# Patient Record
Sex: Male | Born: 1955 | Race: White | Hispanic: No | Marital: Single | Smoking: Former smoker
Health system: Southern US, Community
[De-identification: ages and names within clinical notes are randomized; demographics above are authoritative.]

## PROBLEM LIST (undated history)

## (undated) DIAGNOSIS — R159 Full incontinence of feces: Secondary | ICD-10-CM

## (undated) DIAGNOSIS — N189 Chronic kidney disease, unspecified: Secondary | ICD-10-CM

## (undated) DIAGNOSIS — C20 Malignant neoplasm of rectum: Secondary | ICD-10-CM

## (undated) DIAGNOSIS — Z923 Personal history of irradiation: Secondary | ICD-10-CM

## (undated) DIAGNOSIS — C801 Malignant (primary) neoplasm, unspecified: Secondary | ICD-10-CM

---

## 1981-05-16 HISTORY — PX: MANDIBLE SURGERY: SHX707

## 2013-10-09 ENCOUNTER — Inpatient Hospital Stay (HOSPITAL_COMMUNITY)
Admission: EM | Admit: 2013-10-09 | Discharge: 2013-10-20 | DRG: 329 | Disposition: A | Discharge status: Home Health | Payer: Medicaid Other | Attending: Internal Medicine | Admitting: Internal Medicine

## 2013-10-09 ENCOUNTER — Emergency Department (HOSPITAL_COMMUNITY): Payer: Medicaid Other

## 2013-10-09 ENCOUNTER — Encounter (HOSPITAL_COMMUNITY): Payer: Self-pay | Admitting: Emergency Medicine

## 2013-10-09 ENCOUNTER — Inpatient Hospital Stay (HOSPITAL_COMMUNITY): Payer: Medicaid Other

## 2013-10-09 ENCOUNTER — Other Ambulatory Visit (HOSPITAL_COMMUNITY): Payer: Self-pay

## 2013-10-09 DIAGNOSIS — E860 Dehydration: Secondary | ICD-10-CM | POA: Diagnosis present

## 2013-10-09 DIAGNOSIS — C19 Malignant neoplasm of rectosigmoid junction: Secondary | ICD-10-CM | POA: Diagnosis present

## 2013-10-09 DIAGNOSIS — R197 Diarrhea, unspecified: Secondary | ICD-10-CM

## 2013-10-09 DIAGNOSIS — A498 Other bacterial infections of unspecified site: Secondary | ICD-10-CM | POA: Diagnosis present

## 2013-10-09 DIAGNOSIS — N321 Vesicointestinal fistula: Secondary | ICD-10-CM | POA: Diagnosis present

## 2013-10-09 DIAGNOSIS — Z800 Family history of malignant neoplasm of digestive organs: Secondary | ICD-10-CM | POA: Diagnosis not present

## 2013-10-09 DIAGNOSIS — R933 Abnormal findings on diagnostic imaging of other parts of digestive tract: Secondary | ICD-10-CM

## 2013-10-09 DIAGNOSIS — D490 Neoplasm of unspecified behavior of digestive system: Secondary | ICD-10-CM

## 2013-10-09 DIAGNOSIS — D62 Acute posthemorrhagic anemia: Secondary | ICD-10-CM | POA: Diagnosis present

## 2013-10-09 DIAGNOSIS — D500 Iron deficiency anemia secondary to blood loss (chronic): Secondary | ICD-10-CM | POA: Diagnosis not present

## 2013-10-09 DIAGNOSIS — R000 Tachycardia, unspecified: Secondary | ICD-10-CM | POA: Diagnosis present

## 2013-10-09 DIAGNOSIS — N390 Urinary tract infection, site not specified: Secondary | ICD-10-CM | POA: Diagnosis present

## 2013-10-09 DIAGNOSIS — R918 Other nonspecific abnormal finding of lung field: Secondary | ICD-10-CM | POA: Diagnosis present

## 2013-10-09 DIAGNOSIS — Z91018 Allergy to other foods: Secondary | ICD-10-CM

## 2013-10-09 DIAGNOSIS — E43 Unspecified severe protein-calorie malnutrition: Secondary | ICD-10-CM | POA: Diagnosis present

## 2013-10-09 DIAGNOSIS — K922 Gastrointestinal hemorrhage, unspecified: Secondary | ICD-10-CM | POA: Diagnosis present

## 2013-10-09 DIAGNOSIS — N1330 Unspecified hydronephrosis: Secondary | ICD-10-CM | POA: Diagnosis present

## 2013-10-09 DIAGNOSIS — D649 Anemia, unspecified: Secondary | ICD-10-CM

## 2013-10-09 DIAGNOSIS — R634 Abnormal weight loss: Secondary | ICD-10-CM

## 2013-10-09 DIAGNOSIS — Z681 Body mass index (BMI) 19 or less, adult: Secondary | ICD-10-CM

## 2013-10-09 DIAGNOSIS — D638 Anemia in other chronic diseases classified elsewhere: Secondary | ICD-10-CM

## 2013-10-09 DIAGNOSIS — K6289 Other specified diseases of anus and rectum: Secondary | ICD-10-CM

## 2013-10-09 DIAGNOSIS — D509 Iron deficiency anemia, unspecified: Secondary | ICD-10-CM

## 2013-10-09 DIAGNOSIS — C189 Malignant neoplasm of colon, unspecified: Secondary | ICD-10-CM | POA: Insufficient documentation

## 2013-10-09 DIAGNOSIS — N135 Crossing vessel and stricture of ureter without hydronephrosis: Secondary | ICD-10-CM | POA: Diagnosis present

## 2013-10-09 DIAGNOSIS — N368 Other specified disorders of urethra: Secondary | ICD-10-CM | POA: Diagnosis present

## 2013-10-09 HISTORY — DX: Full incontinence of feces: R15.9

## 2013-10-09 LAB — URINALYSIS, ROUTINE W REFLEX MICROSCOPIC
Bilirubin Urine: NEGATIVE
GLUCOSE, UA: NEGATIVE mg/dL
Ketones, ur: NEGATIVE mg/dL
Nitrite: POSITIVE — AB
PROTEIN: 30 mg/dL — AB
SPECIFIC GRAVITY, URINE: 1.02 (ref 1.005–1.030)
Urobilinogen, UA: 0.2 mg/dL (ref 0.0–1.0)
pH: 7 (ref 5.0–8.0)

## 2013-10-09 LAB — CBC WITH DIFFERENTIAL/PLATELET
Basophils Absolute: 0 10*3/uL (ref 0.0–0.1)
Basophils Relative: 0 % (ref 0–1)
EOS PCT: 0 % (ref 0–5)
Eosinophils Absolute: 0 10*3/uL (ref 0.0–0.7)
HEMATOCRIT: 24.7 % — AB (ref 39.0–52.0)
Hemoglobin: 7.3 g/dL — ABNORMAL LOW (ref 13.0–17.0)
Lymphocytes Relative: 3 % — ABNORMAL LOW (ref 12–46)
Lymphs Abs: 0.4 10*3/uL — ABNORMAL LOW (ref 0.7–4.0)
MCH: 18.5 pg — ABNORMAL LOW (ref 26.0–34.0)
MCHC: 29.6 g/dL — ABNORMAL LOW (ref 30.0–36.0)
MCV: 62.7 fL — ABNORMAL LOW (ref 78.0–100.0)
MONOS PCT: 3 % (ref 3–12)
Monocytes Absolute: 0.4 10*3/uL (ref 0.1–1.0)
Neutro Abs: 12 10*3/uL — ABNORMAL HIGH (ref 1.7–7.7)
Neutrophils Relative %: 94 % — ABNORMAL HIGH (ref 43–77)
PLATELETS: 479 10*3/uL — AB (ref 150–400)
RBC: 3.94 MIL/uL — AB (ref 4.22–5.81)
RDW: 17 % — ABNORMAL HIGH (ref 11.5–15.5)
WBC: 12.8 10*3/uL — AB (ref 4.0–10.5)

## 2013-10-09 LAB — COMPREHENSIVE METABOLIC PANEL
ALK PHOS: 97 U/L (ref 39–117)
ALK PHOS: 90 U/L (ref 39–117)
ALT: 8 U/L (ref 0–53)
ALT: 10 U/L (ref 0–53)
AST: 11 U/L (ref 0–37)
AST: 13 U/L (ref 0–37)
Albumin: 2.5 g/dL — ABNORMAL LOW (ref 3.5–5.2)
Albumin: 2.2 g/dL — ABNORMAL LOW (ref 3.5–5.2)
BILIRUBIN TOTAL: 0.4 mg/dL (ref 0.3–1.2)
BUN: 20 mg/dL (ref 6–23)
BUN: 18 mg/dL (ref 6–23)
CHLORIDE: 88 meq/L — AB (ref 96–112)
CO2: 27 meq/L (ref 19–32)
CO2: 24 meq/L (ref 19–32)
CREATININE: 1.28 mg/dL (ref 0.50–1.35)
Calcium: 9.3 mg/dL (ref 8.4–10.5)
Calcium: 8.7 mg/dL (ref 8.4–10.5)
Chloride: 94 mEq/L — ABNORMAL LOW (ref 96–112)
Creatinine, Ser: 1.14 mg/dL (ref 0.50–1.35)
GFR calc Af Amer: 71 mL/min — ABNORMAL LOW (ref >90)
GFR, EST AFRICAN AMERICAN: 81 mL/min — AB (ref >90)
GFR, EST NON AFRICAN AMERICAN: 61 mL/min — AB (ref >90)
GFR, EST NON AFRICAN AMERICAN: 70 mL/min — AB (ref >90)
GLUCOSE: 206 mg/dL — AB (ref 70–99)
Glucose, Bld: 200 mg/dL — ABNORMAL HIGH (ref 70–99)
POTASSIUM: 4.5 meq/L (ref 3.7–5.3)
POTASSIUM: 4.1 meq/L (ref 3.7–5.3)
Sodium: 130 mEq/L — ABNORMAL LOW (ref 137–147)
Sodium: 131 mEq/L — ABNORMAL LOW (ref 137–147)
Total Bilirubin: 0.4 mg/dL (ref 0.3–1.2)
Total Protein: 7.1 g/dL (ref 6.0–8.3)
Total Protein: 6.4 g/dL (ref 6.0–8.3)

## 2013-10-09 LAB — URINE MICROSCOPIC-ADD ON

## 2013-10-09 LAB — MAGNESIUM: Magnesium: 1.9 mg/dL (ref 1.5–2.5)

## 2013-10-09 LAB — PHOSPHORUS: Phosphorus: 3 mg/dL (ref 2.3–4.6)

## 2013-10-09 LAB — PROTIME-INR
INR: 1.19 (ref 0.00–1.49)
INR: 1.34 (ref 0.00–1.49)
Prothrombin Time: 14.8 seconds (ref 11.6–15.2)
Prothrombin Time: 16.3 seconds — ABNORMAL HIGH (ref 11.6–15.2)

## 2013-10-09 LAB — ABO/RH: ABO/RH(D): A POS

## 2013-10-09 LAB — TROPONIN I: Troponin I: <0.3 ng/mL (ref <0.30)

## 2013-10-09 LAB — POC OCCULT BLOOD, ED: FECAL OCCULT BLD: NEGATIVE

## 2013-10-09 LAB — TSH: TSH: 0.571 u[IU]/mL (ref 0.350–4.500)

## 2013-10-09 LAB — LIPASE, BLOOD: Lipase: 10 U/L — ABNORMAL LOW (ref 11–59)

## 2013-10-09 LAB — APTT: aPTT: 34 seconds (ref 24–37)

## 2013-10-09 MED ORDER — SODIUM CHLORIDE 0.9 % IV BOLUS (SEPSIS)
1000.0000 mL | Freq: Once | INTRAVENOUS | Status: AC
  Administered 2013-10-09: 1000 mL via INTRAVENOUS

## 2013-10-09 MED ORDER — IOHEXOL 300 MG/ML  SOLN
100.0000 mL | Freq: Once | INTRAMUSCULAR | Status: AC | PRN
  Administered 2013-10-09: 100 mL via INTRAVENOUS

## 2013-10-09 MED ORDER — HYDRALAZINE HCL 20 MG/ML IJ SOLN
10.0000 mg | INTRAMUSCULAR | Status: DC | PRN
  Administered 2013-10-09: 10 mg via INTRAVENOUS
  Filled 2013-10-09: qty 1

## 2013-10-09 MED ORDER — METRONIDAZOLE IN NACL 5-0.79 MG/ML-% IV SOLN
500.0000 mg | Freq: Three times a day (TID) | INTRAVENOUS | Status: DC
  Filled 2013-10-09: qty 100

## 2013-10-09 MED ORDER — SODIUM CHLORIDE 0.9 % IV SOLN
INTRAVENOUS | Status: AC
  Administered 2013-10-10: via INTRAVENOUS

## 2013-10-09 MED ORDER — ONDANSETRON HCL 4 MG/2ML IJ SOLN
4.0000 mg | Freq: Once | INTRAMUSCULAR | Status: DC
  Filled 2013-10-09: qty 2

## 2013-10-09 MED ORDER — ACETAMINOPHEN 650 MG RE SUPP: 650.0000 mg | Freq: Four times a day (QID) | RECTAL | Status: DC | PRN

## 2013-10-09 MED ORDER — CIPROFLOXACIN IN D5W 400 MG/200ML IV SOLN
400.0000 mg | Freq: Two times a day (BID) | INTRAVENOUS | Status: DC
  Administered 2013-10-09 – 2013-10-15 (×13): 400 mg via INTRAVENOUS
  Filled 2013-10-09 (×14): qty 200

## 2013-10-09 MED ORDER — ONDANSETRON HCL 4 MG PO TABS: 4.0000 mg | ORAL_TABLET | Freq: Four times a day (QID) | ORAL | Status: DC | PRN

## 2013-10-09 MED ORDER — ONDANSETRON HCL 4 MG/2ML IJ SOLN
4.0000 mg | Freq: Four times a day (QID) | INTRAMUSCULAR | Status: DC | PRN
  Administered 2013-10-10 – 2013-10-15 (×5): 4 mg via INTRAVENOUS
  Filled 2013-10-09 (×5): qty 2

## 2013-10-09 MED ORDER — METRONIDAZOLE IN NACL 5-0.79 MG/ML-% IV SOLN
500.0000 mg | Freq: Three times a day (TID) | INTRAVENOUS | Status: DC
  Administered 2013-10-10 – 2013-10-16 (×20): 500 mg via INTRAVENOUS
  Filled 2013-10-09 (×21): qty 100

## 2013-10-09 MED ORDER — HEPARIN SODIUM (PORCINE) 5000 UNIT/ML IJ SOLN
5000.0000 [IU] | Freq: Three times a day (TID) | INTRAMUSCULAR | Status: AC
  Administered 2013-10-09 – 2013-10-10 (×4): 5000 [IU] via SUBCUTANEOUS
  Filled 2013-10-09 (×7): qty 1

## 2013-10-09 MED ORDER — CIPROFLOXACIN IN D5W 400 MG/200ML IV SOLN: 400.0000 mg | Freq: Two times a day (BID) | INTRAVENOUS | Status: DC

## 2013-10-09 MED ORDER — ACETAMINOPHEN 325 MG PO TABS: 650.0000 mg | ORAL_TABLET | Freq: Four times a day (QID) | ORAL | Status: DC | PRN

## 2013-10-09 MED ORDER — MORPHINE SULFATE 2 MG/ML IJ SOLN
1.0000 mg | INTRAMUSCULAR | Status: DC | PRN
  Administered 2013-10-09 – 2013-10-12 (×14): 1 mg via INTRAVENOUS
  Filled 2013-10-09 (×13): qty 1

## 2013-10-09 MED ORDER — SODIUM CHLORIDE 0.9 % IV SOLN
INTRAVENOUS | Status: AC
  Administered 2013-10-11 – 2013-10-13 (×2): via INTRAVENOUS

## 2013-10-09 MED ORDER — SODIUM CHLORIDE 0.9 % IV SOLN: INTRAVENOUS | Status: DC

## 2013-10-09 MED ORDER — IOHEXOL 300 MG/ML  SOLN
25.0000 mL | INTRAMUSCULAR | Status: AC
  Administered 2013-10-09: 25 mL via ORAL

## 2013-10-09 MED ORDER — SODIUM CHLORIDE 0.9 % IJ SOLN
3.0000 mL | Freq: Two times a day (BID) | INTRAMUSCULAR | Status: DC
  Administered 2013-10-14 – 2013-10-16 (×2): 3 mL via INTRAVENOUS

## 2013-10-09 NOTE — ED Notes (Signed)
Pt taken to CT.

## 2013-10-09 NOTE — Consult Note (Signed)
Reason for Consult:abdominal mass Referring Physician: dr Jinny Blossom docherty  Grant [name] is an 58 y.o. male.  HPI: 28 yom with several months of weight loss that has been worse in last month. He had tried to lose weight over last several years losing 200 lbs.  The last month he has been losing weight (25 lbs) and not trying.  He has been trying to supplement with boost without help.  His appetite is decreased.  He has been having intermittent loose stools and when he has a formed stool it is much thinner/smaller. He denies any abdominal pain associated with this.  He is having foul smelling urine that appears more cloudy and today noted some air via his penis.  He has no n/v.  He is passing some flatus but decreased.  He came to er just for persistence of these symptoms and not acute worsening.  Past Medical History  Diagnosis Date  . Fecal incontinence     History reviewed. No pertinent past surgical history.  History reviewed. No pertinent family history.  Social History:  has no tobacco, alcohol, and drug history on file. he dips, nonsmoker now, occ etoh  Allergies:  Allergies  Allergen Reactions  . Strawberry Anaphylaxis and Hives  . Sunflower Oil Anaphylaxis and Hives    seeds  . Watermelon [Citrullus Vulgaris] Anaphylaxis and Hives    Medications: alleve prn  Results for orders placed during the hospital encounter of 10/09/13 (from the past 48 hour(s))  CBC WITH DIFFERENTIAL     Status: Abnormal   Collection Time    10/09/13  1:31 PM      Result Value Ref Range   WBC 12.8 (*) 4.0 - 10.5 K/uL   RBC 3.94 (*) 4.22 - 5.81 MIL/uL   Hemoglobin 7.3 (*) 13.0 - 17.0 g/dL   Comment: REPEATED TO VERIFY   HCT 24.7 (*) 39.0 - 52.0 %   MCV 62.7 (*) 78.0 - 100.0 fL   MCH 18.5 (*) 26.0 - 34.0 pg   MCHC 29.6 (*) 30.0 - 36.0 g/dL   RDW 17.0 (*) 11.5 - 15.5 %   Platelets 479 (*) 150 - 400 K/uL   Comment: REPEATED TO VERIFY   Neutrophils Relative % 94 (*) 43 - 77 %   Lymphocytes  Relative 3 (*) 12 - 46 %   Monocytes Relative 3  3 - 12 %   Eosinophils Relative 0  0 - 5 %   Basophils Relative 0  0 - 1 %   Neutro Abs 12.0 (*) 1.7 - 7.7 K/uL   Lymphs Abs 0.4 (*) 0.7 - 4.0 K/uL   Monocytes Absolute 0.4  0.1 - 1.0 K/uL   Eosinophils Absolute 0.0  0.0 - 0.7 K/uL   Basophils Absolute 0.0  0.0 - 0.1 K/uL   RBC Morphology ELLIPTOCYTES    COMPREHENSIVE METABOLIC PANEL     Status: Abnormal   Collection Time    10/09/13  1:31 PM      Result Value Ref Range   Sodium 130 (*) 137 - 147 mEq/L   Potassium 4.5  3.7 - 5.3 mEq/L   Chloride 88 (*) 96 - 112 mEq/L   CO2 27  19 - 32 mEq/L   Glucose, Bld 200 (*) 70 - 99 mg/dL   BUN 20  6 - 23 mg/dL   Creatinine, Ser 1.28  0.50 - 1.35 mg/dL   Calcium 9.3  8.4 - 10.5 mg/dL   Total Protein 7.1  6.0 - 8.3 g/dL  Albumin 2.5 (*) 3.5 - 5.2 g/dL   AST 11  0 - 37 U/L   ALT 8  0 - 53 U/L   Alkaline Phosphatase 97  39 - 117 U/L   Total Bilirubin 0.4  0.3 - 1.2 mg/dL   GFR calc non Af Amer 61 (*) >90 mL/min   GFR calc Af Amer 71 (*) >90 mL/min   Comment: (NOTE)     The eGFR has been calculated using the CKD EPI equation.     This calculation has not been validated in all clinical situations.     eGFR's persistently <90 mL/min signify possible Chronic Kidney     Disease.  TROPONIN I     Status: None   Collection Time    10/09/13  1:31 PM      Result Value Ref Range   Troponin I <0.30  <0.30 ng/mL   Comment:            Due to the release kinetics of cTnI,     a negative result within the first hours     of the onset of symptoms does not rule out     myocardial infarction with certainty.     If myocardial infarction is still suspected,     repeat the test at appropriate intervals.  PROTIME-INR     Status: None   Collection Time    10/09/13  1:31 PM      Result Value Ref Range   Prothrombin Time 14.8  11.6 - 15.2 seconds   INR 1.19  0.00 - 1.49  LIPASE, BLOOD     Status: Abnormal   Collection Time    10/09/13  1:31 PM       Result Value Ref Range   Lipase 10 (*) 11 - 59 U/L  TYPE AND SCREEN     Status: None   Collection Time    10/09/13  1:58 PM      Result Value Ref Range   ABO/RH(D) A POS     Antibody Screen NEG     Sample Expiration 10/12/2013    ABO/RH     Status: None   Collection Time    10/09/13  1:58 PM      Result Value Ref Range   ABO/RH(D) A POS    POC OCCULT BLOOD, ED     Status: None   Collection Time    10/09/13  2:03 PM      Result Value Ref Range   Fecal Occult Bld NEGATIVE  NEGATIVE  URINALYSIS, ROUTINE W REFLEX MICROSCOPIC     Status: Abnormal   Collection Time    10/09/13  5:22 PM      Result Value Ref Range   Color, Urine YELLOW  YELLOW   APPearance TURBID (*) CLEAR   Specific Gravity, Urine 1.020  1.005 - 1.030   pH 7.0  5.0 - 8.0   Glucose, UA NEGATIVE  NEGATIVE mg/dL   Hgb urine dipstick LARGE (*) NEGATIVE   Bilirubin Urine NEGATIVE  NEGATIVE   Ketones, ur NEGATIVE  NEGATIVE mg/dL   Protein, ur 30 (*) NEGATIVE mg/dL   Urobilinogen, UA 0.2  0.0 - 1.0 mg/dL   Nitrite POSITIVE (*) NEGATIVE   Leukocytes, UA LARGE (*) NEGATIVE  URINE MICROSCOPIC-ADD ON     Status: Abnormal   Collection Time    10/09/13  5:22 PM      Result Value Ref Range   WBC, UA TOO NUMEROUS TO COUNT  <3  WBC/hpf   RBC / HPF 21-50  <3 RBC/hpf   Bacteria, UA MANY (*) RARE    Ct Abdomen Pelvis W Contrast  10/09/2013   CLINICAL DATA:  Weight loss, diarrhea.  EXAM: CT ABDOMEN AND PELVIS WITH CONTRAST  TECHNIQUE: Multidetector CT imaging of the abdomen and pelvis was performed using the standard protocol following bolus administration of intravenous contrast.  CONTRAST:  158m OMNIPAQUE IOHEXOL 300 MG/ML  SOLN  COMPARISON:  None.  FINDINGS: 5 mm nodule noted in the right lower lobe posteriorly, nonspecific. Triangular nodular density at the left base along the left hemidiaphragm on image 9 measures 7 mm. No effusions. Heart is normal size.  There is a large heterogeneous complex mass in the pelvis in the  region of the rectosigmoid colon. This measures approximately 10.2 x 9.1 cm. Extraluminal locules of gas are noted posterior to the bladder concerning for a contained rupture. There is gas layering within the urinary bladder. This may be root related to recent catheterization. If the patient has not been catheterized, this would be concerning for colovesical fistula. There is right hydronephrosis and perinephric stranding with delayed excretion of contrast from the right kidney. The right ureter is dilated into the pelvis where it appears to be encased by the large rectosigmoid mass.  No evidence of a hepatic metastases. Tiny hypodensity in the dome of the liver is noted and nonspecific but most likely represents a small cyst. Spleen, pancreas, adrenals and left kidney are unremarkable. Small bowel is decompressed. Moderate stool throughout the colon, but no definitive evidence for colonic obstruction at this time.  No acute bony abnormality or focal bone lesion.  IMPRESSION: Large mass in the rectosigmoid colon concerning for locally invasive colorectal cancer, likely with contained perforation. This appears to encase/ involve the mid to distal right ureter with right hydronephrosis/obstruction.  Gas within the urinary bladder, question catheterization. If the patient has not been catheterized, this would be concerning for colovesical fistula.  Small bilateral lower lobe pulmonary nodules, nonspecific. Recommend attention on followup imaging.  These results were called by telephone at the time of interpretation on 10/09/2013 at 5:07 PM to Dr. DTawnya Crook who verbally acknowledged these results.   Electronically Signed   By: KRolm BaptiseM.D.   On: 10/09/2013 17:08   Dg Chest Portable 1 View  10/09/2013   CLINICAL DATA:  Weight loss ever since having the flu earlier this year.  EXAM: PORTABLE CHEST - 1 VIEW  COMPARISON:  None.  FINDINGS: Heart, mediastinum and hila are unremarkable. Lungs are clear. No pleural  effusion. No pneumothorax.  Bony thorax is intact.  IMPRESSION: No active disease.   Electronically Signed   By: DLajean ManesM.D.   On: 10/09/2013 14:00    Review of Systems  Constitutional: Positive for weight loss and malaise/fatigue. Negative for fever and chills.  Respiratory: Negative for shortness of breath.   Cardiovascular: Negative for chest pain.  Gastrointestinal: Positive for diarrhea. Negative for nausea, vomiting and abdominal pain.  Genitourinary: Positive for dysuria and frequency.  Neurological: Positive for weakness.   Blood pressure 141/74, pulse 100, temperature 98.2 F (36.8 C), temperature source Oral, resp. rate 19, SpO2 100.00%. Physical Exam  Vitals reviewed. Constitutional: He is oriented to person, place, and time. He appears well-developed and well-nourished.  HENT:  Head: Normocephalic and atraumatic.  Eyes: No scleral icterus.  Neck: Neck supple.  Cardiovascular: Regular rhythm and intact distal pulses.  Tachycardia present.   Respiratory: Effort normal and breath sounds  normal. He has no wheezes. He has no rales.  GI: Soft. Bowel sounds are normal. He exhibits mass. He exhibits no distension. There is tenderness in the suprapubic area. No hernia. Hernia confirmed negative in the right inguinal area and confirmed negative in the left inguinal area.  Genitourinary:  Just had rectal exam and wishes to defer for now.  Musculoskeletal: Normal range of motion. He exhibits no edema and no tenderness.  Lymphadenopathy:    He has no cervical adenopathy.  Neurological: He is alert and oriented to person, place, and time.    Assessment/Plan: Locally advanced rectal cancer  Appears to have locally advanced rectal cancer with possible bladder and right ureteral involvement.  His nutritional status is poor given history, albumin and weight loss.  He is not obstructed or bleeding currently. Would recommend the following: (he does not need surgical intervention  right now): 1. GI consultation tomorrow to evaluate likely rectal cancer  2. Urology consultation tomorrow to evaluate with cystoscopy, evaluate right ureter 3. CEA with am labs, if cr normal and hydrated tomorrow can get ct chest for staging information to rule out metastatic disease as that would alter plan possibly 4. Will check prealbumin in am also, I don't think he can meet nutritional needs orally right now. I think would be best for him to get picc tomorrow and have tna to build up prior to any planned surgery. 5. Make npo after midnight tonight 6. We will follow with you if/when surgery   Rolm Bookbinder 10/09/2013, 7:00 PM

## 2013-10-09 NOTE — ED Notes (Signed)
Gave orange juice per surgeon stated NPO after midnight per patient.

## 2013-10-09 NOTE — ED Notes (Addendum)
Pt has lost 200 pounds in 4 years and past month 25 without trying; diarr and GI issues. Not seen a doctor for it. Fecal incontinence also reported. Has never had a colonoscopy and never seen GI MD. Pt is pale and diaphoretic. Chest pain and SOB upon exertion. Mild abdominal tenderness.

## 2013-10-09 NOTE — Consult Note (Signed)
Urology Consult   Physician requesting consult: Dr. Serita Grammes  Reason for consult: Right ureteral obstruction and large pelvic mass  History of Present Illness: Grant [name] is a 58 y.o. who presented to the ED today with a history of 25 lbs of weight loss unintentionally over the past month (he has intentionally lost about 150 lbs over the past few years) and poor appetite, pneumaturia, and change in bowel habits.  A CT scan of the abdomen and pelvis revealed a large 10 cm pelvic mass consistent with a probable locally advanced rectal carcinoma along with evidence of right ureteral obstruction related to this mass with obstruction of the distal and mid ureter on the right.  He had delayed contrast excretion from the right kidney. CT imaging of the chest revealed three small indeterminate nodules that are concerning but not definitively metastatic disease.  There is no evidence of hepatic metastases noted.  He does have air noted in the bladder and there is air between the anterior rectum and posterior bladder concerning for a contained perforation of the rectum.  He has had cloudy urine aside from his pneumaturia but denies dysuria or hematuria.  Past Medical History  Diagnosis Date  . Fecal incontinence     History reviewed. No pertinent past surgical history.  Medications:  Home meds:    Medication List    ASK your doctor about these medications       ALEVE PO  Take 2 tablets by mouth 2 (two) times daily as needed (arthritis).        Scheduled Meds: . heparin  5,000 Units Subcutaneous 3 times per day   Continuous Infusions: . sodium chloride    . ciprofloxacin    . metronidazole     PRN Meds:.hydrALAZINE  Allergies:  Allergies  Allergen Reactions  . Strawberry Anaphylaxis and Hives  . Sunflower Oil Anaphylaxis and Hives    seeds  . Watermelon [Citrullus Vulgaris] Anaphylaxis and Hives    History reviewed. No pertinent family history.  Social History:   has no tobacco, alcohol, and drug history on file.  ROS: A complete review of systems was performed.  All systems are negative except for pertinent findings as noted.  Physical Exam:  Vital signs in last 24 hours: Temp:  [98.2 F (36.8 C)-99.1 F (37.3 C)] 98.2 F (36.8 C) (05/27 1726) Pulse Rate:  [89-155] 91 (05/27 2045) Resp:  [14-25] 21 (05/27 2045) BP: (116-145)/(63-101) 130/63 mmHg (05/27 2045) SpO2:  [98 %-100 %] 98 % (05/27 2045) Constitutional:  Alert and oriented, No acute distress Cardiovascular: Regular rate and rhythm, No JVD Respiratory: Normal respiratory effort, Lungs clear bilaterally GI: Abdomen is soft, nontender, nondistended, no abdominal masses Genitourinary: No CVAT. Normal male phallus, testes are descended bilaterally and non-tender and without masses, scrotum is normal in appearance without lesions or masses, perineum is normal on inspection. Rectal: Normal sphincter tone, there is a large rectal mass noted toward the right anterior rectum which feels smooth but appears to be compressing the rectal lumen, prostate is non tender and without nodularity. Prostate size is estimated to be 30 cc Lymphatic: No lymphadenopathy Neurologic: Grossly intact, no focal deficits Psychiatric: Normal mood and affect  Laboratory Data:   Recent Labs  10/09/13 1331  WBC 12.8*  HGB 7.3*  HCT 24.7*  PLT 479*     Recent Labs  10/09/13 1331  NA 130*  K 4.5  CL 88*  GLUCOSE 200*  BUN 20  CALCIUM 9.3  CREATININE 1.28  Results for orders placed during the hospital encounter of 10/09/13 (from the past 24 hour(s))  CBC WITH DIFFERENTIAL     Status: Abnormal   Collection Time    10/09/13  1:31 PM      Result Value Ref Range   WBC 12.8 (*) 4.0 - 10.5 K/uL   RBC 3.94 (*) 4.22 - 5.81 MIL/uL   Hemoglobin 7.3 (*) 13.0 - 17.0 g/dL   HCT 24.7 (*) 39.0 - 52.0 %   MCV 62.7 (*) 78.0 - 100.0 fL   MCH 18.5 (*) 26.0 - 34.0 pg   MCHC 29.6 (*) 30.0 - 36.0 g/dL   RDW 17.0  (*) 11.5 - 15.5 %   Platelets 479 (*) 150 - 400 K/uL   Neutrophils Relative % 94 (*) 43 - 77 %   Lymphocytes Relative 3 (*) 12 - 46 %   Monocytes Relative 3  3 - 12 %   Eosinophils Relative 0  0 - 5 %   Basophils Relative 0  0 - 1 %   Neutro Abs 12.0 (*) 1.7 - 7.7 K/uL   Lymphs Abs 0.4 (*) 0.7 - 4.0 K/uL   Monocytes Absolute 0.4  0.1 - 1.0 K/uL   Eosinophils Absolute 0.0  0.0 - 0.7 K/uL   Basophils Absolute 0.0  0.0 - 0.1 K/uL   RBC Morphology ELLIPTOCYTES    COMPREHENSIVE METABOLIC PANEL     Status: Abnormal   Collection Time    10/09/13  1:31 PM      Result Value Ref Range   Sodium 130 (*) 137 - 147 mEq/L   Potassium 4.5  3.7 - 5.3 mEq/L   Chloride 88 (*) 96 - 112 mEq/L   CO2 27  19 - 32 mEq/L   Glucose, Bld 200 (*) 70 - 99 mg/dL   BUN 20  6 - 23 mg/dL   Creatinine, Ser 1.28  0.50 - 1.35 mg/dL   Calcium 9.3  8.4 - 10.5 mg/dL   Total Protein 7.1  6.0 - 8.3 g/dL   Albumin 2.5 (*) 3.5 - 5.2 g/dL   AST 11  0 - 37 U/L   ALT 8  0 - 53 U/L   Alkaline Phosphatase 97  39 - 117 U/L   Total Bilirubin 0.4  0.3 - 1.2 mg/dL   GFR calc non Af Amer 61 (*) >90 mL/min   GFR calc Af Amer 71 (*) >90 mL/min  TROPONIN I     Status: None   Collection Time    10/09/13  1:31 PM      Result Value Ref Range   Troponin I <0.30  <0.30 ng/mL  PROTIME-INR     Status: None   Collection Time    10/09/13  1:31 PM      Result Value Ref Range   Prothrombin Time 14.8  11.6 - 15.2 seconds   INR 1.19  0.00 - 1.49  LIPASE, BLOOD     Status: Abnormal   Collection Time    10/09/13  1:31 PM      Result Value Ref Range   Lipase 10 (*) 11 - 59 U/L  TYPE AND SCREEN     Status: None   Collection Time    10/09/13  1:58 PM      Result Value Ref Range   ABO/RH(D) A POS     Antibody Screen NEG     Sample Expiration 10/12/2013    ABO/RH     Status: None   Collection  Time    10/09/13  1:58 PM      Result Value Ref Range   ABO/RH(D) A POS    POC OCCULT BLOOD, ED     Status: None   Collection Time     10/09/13  2:03 PM      Result Value Ref Range   Fecal Occult Bld NEGATIVE  NEGATIVE  URINALYSIS, ROUTINE W REFLEX MICROSCOPIC     Status: Abnormal   Collection Time    10/09/13  5:22 PM      Result Value Ref Range   Color, Urine YELLOW  YELLOW   APPearance TURBID (*) CLEAR   Specific Gravity, Urine 1.020  1.005 - 1.030   pH 7.0  5.0 - 8.0   Glucose, UA NEGATIVE  NEGATIVE mg/dL   Hgb urine dipstick LARGE (*) NEGATIVE   Bilirubin Urine NEGATIVE  NEGATIVE   Ketones, ur NEGATIVE  NEGATIVE mg/dL   Protein, ur 30 (*) NEGATIVE mg/dL   Urobilinogen, UA 0.2  0.0 - 1.0 mg/dL   Nitrite POSITIVE (*) NEGATIVE   Leukocytes, UA LARGE (*) NEGATIVE  URINE MICROSCOPIC-ADD ON     Status: Abnormal   Collection Time    10/09/13  5:22 PM      Result Value Ref Range   WBC, UA TOO NUMEROUS TO COUNT  <3 WBC/hpf   RBC / HPF 21-50  <3 RBC/hpf   Bacteria, UA MANY (*) RARE    Urine culture is pending.  Renal Function:  Recent Labs  10/09/13 1331  CREATININE 1.28   CrCl is unknown because there is no height on file for the current visit.  Radiologic Imaging: Ct Chest Wo Contrast  10/09/2013   CLINICAL DATA:  Pulmonary nodules.  Colon cancer.  EXAM: CT CHEST WITHOUT CONTRAST  TECHNIQUE: Multidetector CT imaging of the chest was performed following the standard protocol without IV contrast.  COMPARISON:  CT scan dated 10/09/2013.  FINDINGS: There is an 8 x 4 mm nodule in the right upper lobe anteriorly on image 21 of series 3. There is a 5 mm nodule at the right lung base posteriorly on image 59 of series 3. There is a 6 mm nodule at the left lung base on image 54 of series 3 adjacent to the diaphragm. There is a vague area of multiple subtle nodularity in the left lower lobe laterally on images 49 through 52 which I suspect is inflammatory or postinflammatory. Heart and other mediastinal structures are normal. No hilar or mediastinal adenopathy. No osseous abnormality.  IMPRESSION: There are 3 indeterminate  pulmonary nodules as well as what is probably some minimal postinflammatory nodularity in the left lower lobe. Follow-up CT scan without contrast in 3 months is recommended given this patient's known colon cancer.   Electronically Signed   By: Rozetta Nunnery M.D.   On: 10/09/2013 20:01   Ct Abdomen Pelvis W Contrast  10/09/2013   CLINICAL DATA:  Weight loss, diarrhea.  EXAM: CT ABDOMEN AND PELVIS WITH CONTRAST  TECHNIQUE: Multidetector CT imaging of the abdomen and pelvis was performed using the standard protocol following bolus administration of intravenous contrast.  CONTRAST:  148mL OMNIPAQUE IOHEXOL 300 MG/ML  SOLN  COMPARISON:  None.  FINDINGS: 5 mm nodule noted in the right lower lobe posteriorly, nonspecific. Triangular nodular density at the left base along the left hemidiaphragm on image 9 measures 7 mm. No effusions. Heart is normal size.  There is a large heterogeneous complex mass in the pelvis in the region  of the rectosigmoid colon. This measures approximately 10.2 x 9.1 cm. Extraluminal locules of gas are noted posterior to the bladder concerning for a contained rupture. There is gas layering within the urinary bladder. This may be root related to recent catheterization. If the patient has not been catheterized, this would be concerning for colovesical fistula. There is right hydronephrosis and perinephric stranding with delayed excretion of contrast from the right kidney. The right ureter is dilated into the pelvis where it appears to be encased by the large rectosigmoid mass.  No evidence of a hepatic metastases. Tiny hypodensity in the dome of the liver is noted and nonspecific but most likely represents a small cyst. Spleen, pancreas, adrenals and left kidney are unremarkable. Small bowel is decompressed. Moderate stool throughout the colon, but no definitive evidence for colonic obstruction at this time.  No acute bony abnormality or focal bone lesion.  IMPRESSION: Large mass in the rectosigmoid  colon concerning for locally invasive colorectal cancer, likely with contained perforation. This appears to encase/ involve the mid to distal right ureter with right hydronephrosis/obstruction.  Gas within the urinary bladder, question catheterization. If the patient has not been catheterized, this would be concerning for colovesical fistula.  Small bilateral lower lobe pulmonary nodules, nonspecific. Recommend attention on followup imaging.  These results were called by telephone at the time of interpretation on 10/09/2013 at 5:07 PM to Dr. Tawnya Crook, who verbally acknowledged these results.   Electronically Signed   By: Rolm Baptise M.D.   On: 10/09/2013 17:08   Dg Chest Portable 1 View  10/09/2013   CLINICAL DATA:  Weight loss ever since having the flu earlier this year.  EXAM: PORTABLE CHEST - 1 VIEW  COMPARISON:  None.  FINDINGS: Heart, mediastinum and hila are unremarkable. Lungs are clear. No pleural effusion. No pneumothorax.  Bony thorax is intact.  IMPRESSION: No active disease.   Electronically Signed   By: Lajean Manes M.D.   On: 10/09/2013 14:00    I independently reviewed the above imaging studies.  Impression/Recommendation 1) Large pelvic mass consistent with locally advanced rectal carcinoma: Based on his CT findings, he would be at high risk for needing a cystectomy and urinary diversion with primary surgical resection of his pelvic mass. He would likely significantly increase his chances of bladder preservation by downsizing of his mass if possible (and appropriate) from an oncologic standpoint with neoadjuvant chemotherapy/radiation followed by restaging and definitive surgical resection.   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 Recommend treating UTI with culture specific antibiotics and he will need a CT scan of the pelvis with bladder contrast over the next few days to evaluate for fistula more  definitively. If present, this would likely need to be addressed at the time of definitive surgical therapy of his pelvic mass (which can be delayed if necessary).  3) Right ureteral obstruction: Considering the extent of the pelvic mass, retrograde ureteral stenting would likely be very difficult. I would recommend a right percutaneous nephrostomy tube per interventional radiology acutely to relieve obstruction with subsequent antegrade right ureteral stent placement.  Raynelle Bring 10/09/2013, 8:49 PM    Pryor Curia MD  CC: Dr. Rolm Bookbinder

## 2013-10-09 NOTE — H&P (Addendum)
Hospitalist Admission History and Physical  Patient name: Grant [name] Medical record number: [id number] Date of birth: 10/06/1955 Age: 58 y.o. Gender: male  Primary Care Provider: No PCP Per Patient  Chief Complaint: colon cancer  History of Present Illness:This is a 58 y.o. year old male with no significant prior medical history presenting with intermittent diarrhea, weight loss, colon cancer. Patient states he's been having recurrent bouts of nonbilious nonbloody diarrhea for past 5-6 months. Symptoms usually self resolve. There is no associated abdominal pain, nausea or vomiting. Today point the patient was a 25 pound weight loss within the past one month. Denies any fevers or chills at home. Presented to the ER today because of worsening diarrhea. Has also had some foul-smelling urine. Presents to the ER afebrile. Intermittently tachycardic into the 150s. Otherwise hemodynamically stable. White blood cell count of 0.8. Hemoglobin 7.3. Creatinine 1.28. Glucose 200. Lipase LLN @ 10. Hemoccult negative. Had a CT of the abdomen and pelvis showed a large mass in the rectosigmoid colon concerning for locally invasive colorectal cancer with encasement of them right ureter with right hydronephrosis\extraction as well as possible colovesical fistula and bilateral lower lobe pulmonary nodules. Surgery was consulted with recommendation for medical admission.  Patient Active Problem List   Diagnosis Date Noted  . Colon cancer 10/09/2013   Past Medical History: Past Medical History  Diagnosis Date  . Fecal incontinence     Past Surgical History: History reviewed. No pertinent past surgical history.  Social History: History   Social History  . Marital Status: Single    Spouse Name: N/A    Number of Children: N/A  . Years of Education: N/A   Social History Main Topics  . Smoking status: None  . Smokeless tobacco: None  . Alcohol Use: None  . Drug Use: None  . Sexual Activity: None    Other Topics Concern  . None   Social History Narrative  . None    Family History: History reviewed. No pertinent family history.  Allergies: Allergies  Allergen Reactions  . Strawberry Anaphylaxis and Hives  . Sunflower Oil Anaphylaxis and Hives    seeds  . Watermelon [Citrullus Vulgaris] Anaphylaxis and Hives    Current Facility-Administered Medications  Medication Dose Route Frequency Provider Last Rate Last Dose  . heparin injection 5,000 Units  5,000 Units Subcutaneous 3 times per day Shanda Howells, MD      . hydrALAZINE (APRESOLINE) injection 10 mg  10 mg Intravenous Q4H PRN [name], MD   10 mg at 10/09/13 1853  . sodium chloride 0.9 % bolus 1,000 mL  1,000 mL Intravenous Once Ezequiel Essex, MD 500 mL/hr at 10/09/13 1800 1,000 mL at 10/09/13 1800   Current Outpatient Prescriptions  Medication Sig Dispense Refill  . Naproxen Sodium (ALEVE PO) Take 2 tablets by mouth 2 (two) times daily as needed (arthritis).       Review Of Systems: 12 point ROS negative except as noted above in HPI.  Physical Exam: Filed Vitals:   10/09/13 1900  BP: 133/85  Pulse: 155  Temp:   Resp: 15    General: alert, cooperative and cachectic HEENT: PERRLA, extra ocular movement intact and poor dentition  Heart: S1, S2 normal, no murmur, rub or gallop, regular rate and rhythm Lungs: clear to auscultation, no wheezes or rales and unlabored breathing Abdomen: + bowel sounds, mild LLQ/suprapubic TTP  Extremities: extremities normal, atraumatic, no cyanosis or edema Skin:no rashes, no ecchymoses Neurology: normal without focal findings  Labs  and Imaging: Lab Results  Component Value Date/Time   NA 130* 10/09/2013  1:31 PM   K 4.5 10/09/2013  1:31 PM   CL 88* 10/09/2013  1:31 PM   CO2 27 10/09/2013  1:31 PM   BUN 20 10/09/2013  1:31 PM   CREATININE 1.28 10/09/2013  1:31 PM   GLUCOSE 200* 10/09/2013  1:31 PM   Lab Results  Component Value Date   WBC 12.8* 10/09/2013   HGB 7.3*  10/09/2013   HCT 24.7* 10/09/2013   MCV 62.7* 10/09/2013   PLT 479* 10/09/2013   Urinalysis    Component Value Date/Time   COLORURINE YELLOW 10/09/2013 1722   APPEARANCEUR TURBID* 10/09/2013 1722   LABSPEC 1.020 10/09/2013 1722   PHURINE 7.0 10/09/2013 1722   GLUCOSEU NEGATIVE 10/09/2013 1722   HGBUR LARGE* 10/09/2013 1722   BILIRUBINUR NEGATIVE 10/09/2013 1722   KETONESUR NEGATIVE 10/09/2013 1722   PROTEINUR 30* 10/09/2013 1722   UROBILINOGEN 0.2 10/09/2013 1722   NITRITE POSITIVE* 10/09/2013 1722   LEUKOCYTESUR LARGE* 10/09/2013 1722       Ct Abdomen Pelvis W Contrast  10/09/2013   CLINICAL DATA:  Weight loss, diarrhea.  EXAM: CT ABDOMEN AND PELVIS WITH CONTRAST  TECHNIQUE: Multidetector CT imaging of the abdomen and pelvis was performed using the standard protocol following bolus administration of intravenous contrast.  CONTRAST:  117mL OMNIPAQUE IOHEXOL 300 MG/ML  SOLN  COMPARISON:  None.  FINDINGS: 5 mm nodule noted in the right lower lobe posteriorly, nonspecific. Triangular nodular density at the left base along the left hemidiaphragm on image 9 measures 7 mm. No effusions. Heart is normal size.  There is a large heterogeneous complex mass in the pelvis in the region of the rectosigmoid colon. This measures approximately 10.2 x 9.1 cm. Extraluminal locules of gas are noted posterior to the bladder concerning for a contained rupture. There is gas layering within the urinary bladder. This may be root related to recent catheterization. If the patient has not been catheterized, this would be concerning for colovesical fistula. There is right hydronephrosis and perinephric stranding with delayed excretion of contrast from the right kidney. The right ureter is dilated into the pelvis where it appears to be encased by the large rectosigmoid mass.  No evidence of a hepatic metastases. Tiny hypodensity in the dome of the liver is noted and nonspecific but most likely represents a small cyst. Spleen, pancreas,  adrenals and left kidney are unremarkable. Small bowel is decompressed. Moderate stool throughout the colon, but no definitive evidence for colonic obstruction at this time.  No acute bony abnormality or focal bone lesion.  IMPRESSION: Large mass in the rectosigmoid colon concerning for locally invasive colorectal cancer, likely with contained perforation. This appears to encase/ involve the mid to distal right ureter with right hydronephrosis/obstruction.  Gas within the urinary bladder, question catheterization. If the patient has not been catheterized, this would be concerning for colovesical fistula.  Small bilateral lower lobe pulmonary nodules, nonspecific. Recommend attention on followup imaging.  These results were called by telephone at the time of interpretation on 10/09/2013 at 5:07 PM to Dr. Tawnya Crook, who verbally acknowledged these results.   Electronically Signed   By: Rolm Baptise M.D.   On: 10/09/2013 17:08   Dg Chest Portable 1 View  10/09/2013   CLINICAL DATA:  Weight loss ever since having the flu earlier this year.  EXAM: PORTABLE CHEST - 1 VIEW  COMPARISON:  None.  FINDINGS: Heart, mediastinum and hila are unremarkable. Lungs  are clear. No pleural effusion. No pneumothorax.  Bony thorax is intact.  IMPRESSION: No active disease.   Electronically Signed   By: Lajean Manes M.D.   On: 10/09/2013 14:00     Assessment and Plan: Grant [name] is a 58 y.o. year old male presenting with invasive colon cancer   Colon Cancer: Discussed case with Dr. Donne Hazel. Like to hold off on any invasive procedures. Would like to get gastroenterology as well as urology involved.  He will discuss case with them.Discuss antibiotics. He would like to hold off on this. Continued to clinically follow. Low threshold to start broad-spectrum antibiotics if patient spikes fever. Tumor markers.   Anemia: Noted hemoglobin 7.3. Likely secondary to progressive colon cancer. Hemoccult negative today. Type and  screen. Anemia panel.   Leukocytosis: Mild. Noted UA findings indicative of infection in setting of likely colovesical fistula. Urology consult pending. Panculture. Follow up pending urology recs.   Tachycardia: sinus tach on EKG. Asymptomatic. Dry on exam. Hydrate. Telemetry bed.   FEN/GI: NPO PMN. Possible PICC for TNA tomorrow per surgery recs.  Prophylaxis: sub q heparin  Disposition: pending further evaluation  Code Status:Full Code     Clinical Update: Received follow up call from Dr. Donne Hazel. Urology may seen pt tonight. GI to likely see in am. Will place on cipro and flagyl for infectious coverage given UTI findings (noted to be also urinating pockets of air).     Shanda Howells MD  Pager: [phone]

## 2013-10-09 NOTE — ED Provider Notes (Signed)
5:23 PM Care transferred from Dr. Wyvonnia Dusky on ill-appearing 58 y.o. male with wt loss and d/a, near syncopal episode with suspected undiagnosed malignancy. CT w/ large rectosigmoid cancer with contained perforation, R hydronephrosis/obstruction and possible colovesical fistula.  [name]  Gen surgery consulted, will see pt but does not expect pt is good candidate for urgent surgery. Pt will be admitted to [facility].   1. Colon cancer   2. Colovesical fistula   3. Hydronephrosis, right   4. Anemia   5. Diarrhea   6. Unintended weight loss   7. Anemia of chronic disease   8. Severe protein-calorie malnutrition    .   Neta Ehlers, MD 10/10/13 1246

## 2013-10-09 NOTE — ED Provider Notes (Signed)
CSN: [id number]     Arrival date & time 10/09/13  1303 History   First MD Initiated Contact with Patient 10/09/13 1328     Chief Complaint  Patient presents with  . Weight Loss  . Diarrhea     (Consider location/radiation/quality/duration/timing/severity/associated sxs/prior Treatment) HPI Comments: Patient presents with a one-month history of diarrhea that he describes as described loose watery stools without blood. He denies any nausea or vomiting. He states he's lost 25 pounds in the past month without trying. There is generalized weakness, lightheadedness and dizziness.. Near-syncopal episode in the shower today that prompted his evaluation. He has not seen a doctor in 25 years. He denies any chronic medical problems is not take any medications. 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 He appears very pale and weak. States had a poor appetite. He has some chest tightness and lightheadedness with his near-syncopal episode earlier today but none currently.  The history is provided by the patient and the EMS personnel.    Past Medical History  Diagnosis Date  . Fecal incontinence    History reviewed. No pertinent past surgical history. History reviewed. No pertinent family history. History  Substance Use Topics  . Smoking status: Not on file  . Smokeless tobacco: Not on file  . Alcohol Use: Not on file    Review of Systems  Constitutional: Positive for activity change, appetite change and fatigue. Negative for fever.  HENT: Negative for congestion and rhinorrhea.   Respiratory: Positive for chest tightness and shortness of breath.   Cardiovascular: Negative for chest pain.  Gastrointestinal: Positive for abdominal pain and diarrhea. Negative for nausea and vomiting.  Genitourinary: Negative for testicular pain.  Musculoskeletal: Positive for arthralgias and myalgias. Negative for neck pain and neck stiffness.  Skin: Positive for pallor. Negative for wound.  Neurological:  Positive for dizziness, weakness and light-headedness.  A complete 10 system review of systems was obtained and all systems are negative except as noted in the HPI and PMH.      Allergies  Strawberry; Sunflower oil; and Watermelon  Home Medications   Prior to Admission medications   Medication Sig Start Date End Date Taking? Authorizing Provider  Naproxen Sodium (ALEVE PO) Take 2 tablets by mouth 2 (two) times daily as needed (arthritis).   Yes Historical Provider, MD   BP 117/78  Pulse 115  Temp(Src) 99.1 F (37.3 C) (Oral)  Resp 24  SpO2 100% Physical Exam  Constitutional: He is oriented to person, place, and time. He appears well-developed. No distress.  cacehctic and pale  HENT:  Head: Normocephalic and atraumatic.  Mouth/Throat: No oropharyngeal exudate.  Pallor cachectic  Eyes: EOM are normal. Pupils are equal, round, and reactive to light.  Neck: Normal range of motion. Neck supple.  Cardiovascular: Normal rate, regular rhythm and normal heart sounds.   No murmur heard. tachycardia  Pulmonary/Chest: Effort normal. No respiratory distress.  Abdominal: Soft. There is no tenderness. There is no rebound and no guarding.  No masses  Genitourinary: Guaiac negative stool.  No masses, brown stool, no gross blood  Musculoskeletal: Normal range of motion. He exhibits no edema and no tenderness.  Neurological: He is alert and oriented to person, place, and time. No cranial nerve deficit. He exhibits normal muscle tone. Coordination normal.    ED Course  Procedures (including critical care time) Labs Review Labs Reviewed  CBC WITH DIFFERENTIAL - Abnormal; Notable for the following:    WBC 12.8 (*)    RBC  3.94 (*)    Hemoglobin 7.3 (*)    HCT 24.7 (*)    MCV 62.7 (*)    MCH 18.5 (*)    MCHC 29.6 (*)    RDW 17.0 (*)    Platelets 479 (*)    Neutrophils Relative % 94 (*)    Lymphocytes Relative 3 (*)    Neutro Abs 12.0 (*)    Lymphs Abs 0.4 (*)    All other  components within normal limits  COMPREHENSIVE METABOLIC PANEL - Abnormal; Notable for the following:    Sodium 130 (*)    Chloride 88 (*)    Glucose, Bld 200 (*)    Albumin 2.5 (*)    GFR calc non Af Amer 61 (*)    GFR calc Af Amer 71 (*)    All other components within normal limits  LIPASE, BLOOD - Abnormal; Notable for the following:    Lipase 10 (*)    All other components within normal limits  TROPONIN I  PROTIME-INR  URINALYSIS, ROUTINE W REFLEX MICROSCOPIC  POC OCCULT BLOOD, ED  TYPE AND SCREEN    Imaging Review Dg Chest Portable 1 View  10/09/2013   CLINICAL DATA:  Weight loss ever since having the flu earlier this year.  EXAM: PORTABLE CHEST - 1 VIEW  COMPARISON:  None.  FINDINGS: Heart, mediastinum and hila are unremarkable. Lungs are clear. No pleural effusion. No pneumothorax.  Bony thorax is intact.  IMPRESSION: No active disease.   Electronically Signed   By: Lajean Manes M.D.   On: 10/09/2013 14:00     EKG Interpretation   Date/Time:  Wednesday Oct 09 2013 13:18:58 EDT Ventricular Rate:  104 PR Interval:  155 QRS Duration: 98 QT Interval:  327 QTC Calculation: 430 R Axis:   76 Text Interpretation:  Sinus tachycardia Right atrial enlargement No  previous ECGs available Confirmed by Oluwatimilehin Balfour  MD, Avarae Zwart [phone]) on  10/09/2013 1:29:29 PM      MDM   Final diagnoses:  None   Patient appears ill with pallor, tachycardia, dehydration.  Abdomen without peritoneal signs.  Concern for carcinoma, possible colon.   FOBT negative but poor sample.  Hb 7.3  Type and screen sent.   Will hold transfusion at this time as does not appear to be actively bleeding. HR 110s, improving with IV fluids.  IVF and symptom control given. Patient denies pain. He is aware cancer is high on differential. CT pending at time of sign out to Dr. Tawnya Crook.  Anticipate admission.  BP 128/73  Pulse 95  Temp(Src) 99.1 F (37.3 C) (Oral)  Resp 16  SpO2 99%     Ezequiel Essex,  MD 10/09/13 1715

## 2013-10-09 NOTE — ED Notes (Signed)
Pt using the restroom and not taken to CT. CT will return to take pt to scan.

## 2013-10-10 ENCOUNTER — Encounter (HOSPITAL_COMMUNITY): Payer: Self-pay

## 2013-10-10 DIAGNOSIS — K6289 Other specified diseases of anus and rectum: Secondary | ICD-10-CM | POA: Diagnosis present

## 2013-10-10 DIAGNOSIS — E43 Unspecified severe protein-calorie malnutrition: Secondary | ICD-10-CM | POA: Diagnosis present

## 2013-10-10 DIAGNOSIS — N135 Crossing vessel and stricture of ureter without hydronephrosis: Secondary | ICD-10-CM | POA: Diagnosis present

## 2013-10-10 DIAGNOSIS — D649 Anemia, unspecified: Secondary | ICD-10-CM

## 2013-10-10 DIAGNOSIS — R634 Abnormal weight loss: Secondary | ICD-10-CM

## 2013-10-10 DIAGNOSIS — D638 Anemia in other chronic diseases classified elsewhere: Secondary | ICD-10-CM

## 2013-10-10 DIAGNOSIS — D509 Iron deficiency anemia, unspecified: Secondary | ICD-10-CM | POA: Diagnosis present

## 2013-10-10 DIAGNOSIS — R933 Abnormal findings on diagnostic imaging of other parts of digestive tract: Secondary | ICD-10-CM

## 2013-10-10 DIAGNOSIS — N321 Vesicointestinal fistula: Secondary | ICD-10-CM | POA: Diagnosis present

## 2013-10-10 LAB — HEMOGLOBIN AND HEMATOCRIT, BLOOD
HEMATOCRIT: 24.2 % — AB (ref 39.0–52.0)
Hemoglobin: 7.6 g/dL — ABNORMAL LOW (ref 13.0–17.0)

## 2013-10-10 LAB — CBC
HCT: 19.7 % — ABNORMAL LOW (ref 39.0–52.0)
Hemoglobin: 6 g/dL — CL (ref 13.0–17.0)
MCH: 19 pg — ABNORMAL LOW (ref 26.0–34.0)
MCHC: 30.5 g/dL (ref 30.0–36.0)
MCV: 62.3 fL — ABNORMAL LOW (ref 78.0–100.0)
PLATELETS: 377 10*3/uL (ref 150–400)
RBC: 3.16 MIL/uL — ABNORMAL LOW (ref 4.22–5.81)
RDW: 17 % — AB (ref 11.5–15.5)
WBC: 8.4 10*3/uL (ref 4.0–10.5)

## 2013-10-10 LAB — PREPARE RBC (CROSSMATCH)

## 2013-10-10 LAB — COMPREHENSIVE METABOLIC PANEL
ALK PHOS: 81 U/L (ref 39–117)
ALT: 8 U/L (ref 0–53)
AST: 13 U/L (ref 0–37)
Albumin: 2.1 g/dL — ABNORMAL LOW (ref 3.5–5.2)
BILIRUBIN TOTAL: 0.3 mg/dL (ref 0.3–1.2)
BUN: 16 mg/dL (ref 6–23)
CHLORIDE: 94 meq/L — AB (ref 96–112)
CO2: 25 mEq/L (ref 19–32)
CREATININE: 1.14 mg/dL (ref 0.50–1.35)
Calcium: 8.6 mg/dL (ref 8.4–10.5)
GFR calc non Af Amer: 70 mL/min — ABNORMAL LOW (ref >90)
GFR, EST AFRICAN AMERICAN: 81 mL/min — AB (ref >90)
Glucose, Bld: 102 mg/dL — ABNORMAL HIGH (ref 70–99)
Potassium: 3.9 mEq/L (ref 3.7–5.3)
Sodium: 132 mEq/L — ABNORMAL LOW (ref 137–147)
Total Protein: 6.2 g/dL (ref 6.0–8.3)

## 2013-10-10 LAB — PREALBUMIN: Prealbumin: 5.5 mg/dL — ABNORMAL LOW (ref 17.0–34.0)

## 2013-10-10 LAB — AFP TUMOR MARKER: AFP-Tumor Marker: 1.7 ng/mL (ref 0.0–8.0)

## 2013-10-10 LAB — CANCER ANTIGEN 19-9: CA 19-9: 10.3 U/mL — ABNORMAL LOW (ref <35.0)

## 2013-10-10 LAB — CA 125: CA 125: 7.2 U/mL (ref 0.0–30.2)

## 2013-10-10 LAB — CEA: CEA: 3.7 ng/mL (ref 0.0–5.0)

## 2013-10-10 MED ORDER — PEG-KCL-NACL-NASULF-NA ASC-C 100 G PO SOLR: 1.0000 | Freq: Once | ORAL | Status: DC

## 2013-10-10 MED ORDER — HEPARIN SODIUM (PORCINE) 5000 UNIT/ML IJ SOLN
5000.0000 [IU] | Freq: Three times a day (TID) | INTRAMUSCULAR | Status: DC
  Administered 2013-10-12 – 2013-10-14 (×6): 5000 [IU] via SUBCUTANEOUS
  Filled 2013-10-10 (×12): qty 1

## 2013-10-10 MED ORDER — PEG-KCL-NACL-NASULF-NA ASC-C 100 G PO SOLR
0.5000 | Freq: Once | ORAL | Status: DC
  Filled 2013-10-10 (×2): qty 1

## 2013-10-10 MED ORDER — PEG-KCL-NACL-NASULF-NA ASC-C 100 G PO SOLR
0.5000 | Freq: Once | ORAL | Status: AC
  Administered 2013-10-11: 100 g via ORAL
  Filled 2013-10-10: qty 1

## 2013-10-10 NOTE — Consult Note (Signed)
Referring Provider: No ref. provider found Primary Care Physician:  No PCP Per Patient Primary Gastroenterologist:  None, unassigned  Reason for Consultation:  Rectal mass  HPI: Grant [name] is a 58 y.o. male with no significant prior medical history presenting with intermittent diarrhea, weight loss, and weakness. Patient states he's been having recurrent bouts of non-bloody diarrhea for past 4 months or so. Symptoms usually self resolve before returning again. There is no associated abdominal pain, nausea or vomiting.  He has lost close to 300 pounds, most of which was unintentional, but recently he lost 25 pounds unintentionally within the past month or so. Denies any fevers or chills at home.  No issues with urination until the past couple of days when he noticed that he has been passing air when urinating.  In the ER he was afebrile. Intermittently tachycardic into the 150s. Otherwise hemodynamically stable. White blood cell count of 0.8. Hemoglobin 7.3. Creatinine 1.28. Glucose 200. Lipase WLN @ 10. Hemoccult negative, but apparently mass was noted on DRE. Had a CT of the abdomen and pelvis showed a large mass in the rectosigmoid colon concerning for locally invasive colorectal cancer with encasement of the right ureter with right hydronephrosis/obstruction as well as possible colovesical fistula and bilateral lower lobe pulmonary nodules.  Surgery and urology have both been consulted as well.  GI consult has been requested for colonoscopy with tissue biopsy.    CEA 3.7.  CA 19-9, CA 125, and AFP also normal.  Hgb was 6.0 grams this AM so he received two units of PRBC's.  He is having right nephrostomy tube placed tomorrow.  Pre-ablumin is low at 5.5 and there has been discussion regarding PICC placement and initiation of TNA.  He is on a regular diet currently.  Cipro and flagyl have been started.  Patient reports that his mother died of colon cancer and he had a maternal aunt who also  had colon cancer.  Patient himself has never undergone colonoscopy.   Past Medical History  Diagnosis Date  . Fecal incontinence     Past Surgical History  Procedure Laterality Date  . Mandible surgery  1983    Prior to Admission medications   Medication Sig Start Date End Date Taking? Authorizing Provider  Naproxen Sodium (ALEVE PO) Take 2 tablets by mouth 2 (two) times daily as needed (arthritis).   Yes Historical Provider, MD    Current Facility-Administered Medications  Medication Dose Route Frequency Provider Last Rate Last Dose  . 0.9 %  sodium chloride infusion   Intravenous Continuous Shanda Howells, MD      . acetaminophen (TYLENOL) tablet 650 mg  650 mg Oral Q6H PRN [name], MD       Or  . acetaminophen (TYLENOL) suppository 650 mg  650 mg Rectal Q6H PRN [name], MD      . ciprofloxacin (CIPRO) IVPB 400 mg  400 mg Intravenous Q12H [name], MD   400 mg at 10/09/13 2357  . heparin injection 5,000 Units  5,000 Units Subcutaneous 3 times per day Shanda Howells, MD   5,000 Units at 10/10/13 0557  . hydrALAZINE (APRESOLINE) injection 10 mg  10 mg Intravenous Q4H PRN [name], MD   10 mg at 10/09/13 1853  . metroNIDAZOLE (FLAGYL) IVPB 500 mg  500 mg Intravenous Q8H [name], MD   500 mg at 10/10/13 0543  . morphine 2 MG/ML injection 1 mg  1 mg Intravenous Q4H PRN Alma M  Charlies Silvers, MD   1 mg at 10/10/13 [phone]  . ondansetron (ZOFRAN) tablet 4 mg  4 mg Oral Q6H PRN [name], MD       Or  . ondansetron [facility]) injection 4 mg  4 mg Intravenous Q6H PRN [name], MD      . sodium chloride 0.9 % injection 3 mL  3 mL Intravenous Q12H [name], MD        Allergies as of 10/09/2013 - Review Complete 10/09/2013  Allergen Reaction Noted  . Strawberry Anaphylaxis and Hives 10/09/2013  . Sunflower oil Anaphylaxis and Hives 10/09/2013  . Watermelon [citrullus vulgaris] Anaphylaxis and Hives 10/09/2013    History reviewed. No pertinent family  history.  History   Social History  . Marital Status: Single    Spouse Name: N/A    Number of Children: N/A  . Years of Education: N/A   Occupational History  . Not on file.   Social History Main Topics  . Smoking status: Former Smoker    Quit date: 05/16/1988  . Smokeless tobacco: Current User    Types: Snuff  . Alcohol Use: No     Comment: OCCASIONAL   . Drug Use: Yes    Special: Marijuana  . Sexual Activity: Not on file   Other Topics Concern  . Not on file   Social History Narrative  . No narrative on file    Review of Systems: Ten point ROS is O/W negative except as mentioned in HPI.  Physical Exam: Vital signs in last 24 hours: Temp:  [97.8 F (36.6 C)-99.2 F (37.3 C)] 98.5 F (36.9 C) (05/28 0945) Pulse Rate:  [79-155] 87 (05/28 0945) Resp:  [14-25] 16 (05/28 0945) BP: (112-148)/(61-101) 123/72 mmHg (05/28 0945) SpO2:  [98 %-100 %] 99 % (05/28 0945) Weight:  [156 lb (70.761 kg)] 156 lb (70.761 kg) (05/27 2104) Last BM Date: 10/09/13 General:   Alert, thin, cachectic appearing, in NAD Head:  Normocephalic and atraumatic.  Temporal muscle wasting. Eyes:  Sclera clear, no icterus.   Conjunctiva pink. Ears:  Normal auditory acuity. Mouth:  No deformity or lesions.   Lungs:  Clear throughout to auscultation.  No wheezes, crackles, or rhonchi.  Heart:  Regular rate and rhythm; no murmurs, clicks, rubs, or gallops. Abdomen:  Soft, thin, non-distended.  BS present.  Non-tender.   Rectal:  Not performed. Msk:  Symmetrical without gross deformities. Pulses:  Normal pulses noted. Extremities:  Without clubbing or edema.  Muscle wasting noted. Neurologic:  Alert and  oriented x4;  grossly normal neurologically. Skin:  Intact without significant lesions or rashes. Psych:  Alert and cooperative. Normal mood and affect.  Intake/Output this shift: Total I/O In: 12.5 [Blood:12.5] Out: -   Lab Results:  Recent Labs  10/09/13 1331 10/10/13 0345  WBC 12.8*  8.4  HGB 7.3* 6.0*  HCT 24.7* 19.7*  PLT 479* 377   BMET  Recent Labs  10/09/13 1331 10/09/13 2138 10/10/13 0345  NA 130* 131* 132*  K 4.5 4.1 3.9  CL 88* 94* 94*  CO2 27 24 25   GLUCOSE 200* 206* 102*  BUN 20 18 16   CREATININE 1.28 1.14 1.14  CALCIUM 9.3 8.7 8.6   LFT  Recent Labs  10/10/13 0345  PROT 6.2  ALBUMIN 2.1*  AST 13  ALT 8  ALKPHOS 81  BILITOT 0.3   PT/INR  Recent Labs  10/09/13 1331 10/09/13 2138  LABPROT 14.8 16.3*  INR 1.19 1.34  Studies/Results: Ct Chest Wo Contrast  10/09/2013   CLINICAL DATA:  Pulmonary nodules.  Colon cancer.  EXAM: CT CHEST WITHOUT CONTRAST  TECHNIQUE: Multidetector CT imaging of the chest was performed following the standard protocol without IV contrast.  COMPARISON:  CT scan dated 10/09/2013.  FINDINGS: There is an 8 x 4 mm nodule in the right upper lobe anteriorly on image 21 of series 3. There is a 5 mm nodule at the right lung base posteriorly on image 59 of series 3. There is a 6 mm nodule at the left lung base on image 54 of series 3 adjacent to the diaphragm. There is a vague area of multiple subtle nodularity in the left lower lobe laterally on images 49 through 52 which I suspect is inflammatory or postinflammatory. Heart and other mediastinal structures are normal. No hilar or mediastinal adenopathy. No osseous abnormality.  IMPRESSION: There are 3 indeterminate pulmonary nodules as well as what is probably some minimal postinflammatory nodularity in the left lower lobe. Follow-up CT scan without contrast in 3 months is recommended given this patient's known colon cancer.   Electronically Signed   By: Rozetta Nunnery M.D.   On: 10/09/2013 20:01   Ct Abdomen Pelvis W Contrast  10/09/2013   CLINICAL DATA:  Weight loss, diarrhea.  EXAM: CT ABDOMEN AND PELVIS WITH CONTRAST  TECHNIQUE: Multidetector CT imaging of the abdomen and pelvis was performed using the standard protocol following bolus administration of intravenous  contrast.  CONTRAST:  182mL OMNIPAQUE IOHEXOL 300 MG/ML  SOLN  COMPARISON:  None.  FINDINGS: 5 mm nodule noted in the right lower lobe posteriorly, nonspecific. Triangular nodular density at the left base along the left hemidiaphragm on image 9 measures 7 mm. No effusions. Heart is normal size.  There is a large heterogeneous complex mass in the pelvis in the region of the rectosigmoid colon. This measures approximately 10.2 x 9.1 cm. Extraluminal locules of gas are noted posterior to the bladder concerning for a contained rupture. There is gas layering within the urinary bladder. This may be root related to recent catheterization. If the patient has not been catheterized, this would be concerning for colovesical fistula. There is right hydronephrosis and perinephric stranding with delayed excretion of contrast from the right kidney. The right ureter is dilated into the pelvis where it appears to be encased by the large rectosigmoid mass.  No evidence of a hepatic metastases. Tiny hypodensity in the dome of the liver is noted and nonspecific but most likely represents a small cyst. Spleen, pancreas, adrenals and left kidney are unremarkable. Small bowel is decompressed. Moderate stool throughout the colon, but no definitive evidence for colonic obstruction at this time.  No acute bony abnormality or focal bone lesion.  IMPRESSION: Large mass in the rectosigmoid colon concerning for locally invasive colorectal cancer, likely with contained perforation. This appears to encase/ involve the mid to distal right ureter with right hydronephrosis/obstruction.  Gas within the urinary bladder, question catheterization. If the patient has not been catheterized, this would be concerning for colovesical fistula.  Small bilateral lower lobe pulmonary nodules, nonspecific. Recommend attention on followup imaging.  These results were called by telephone at the time of interpretation on 10/09/2013 at 5:07 PM to Dr. Tawnya Crook, who  verbally acknowledged these results.   Electronically Signed   By: Rolm Baptise M.D.   On: 10/09/2013 17:08   Dg Chest Portable 1 View  10/09/2013   CLINICAL DATA:  Weight loss ever since having the flu  earlier this year.  EXAM: PORTABLE CHEST - 1 VIEW  COMPARISON:  None.  FINDINGS: Heart, mediastinum and hila are unremarkable. Lungs are clear. No pleural effusion. No pneumothorax.  Bony thorax is intact.  IMPRESSION: No active disease.   Electronically Signed   By: Lajean Manes M.D.   On: 10/09/2013 14:00    IMPRESSION:  -Colon mass in rectosigmoid colon measuring 10.2 x 9.1 cm:  With right ureteral obstruction/hydronephrosis and possible colovesical fistula.   -Anemia:  Now GI bleeding.  Hgb 6.0 grams this AM.  Received 2 units PRBC's.  Likely secondary to progressive colon cancer. -Malnutrition:  Prealbumin is low at 5.5.  PLAN: -IR planning right nephrostomy tube placement on 5/29. -Urology and surgery both following as well. -Plans for colonoscopy per Dr. Henrene Pastor. -Monitor Hgb. -Continue cipro and flagyl. -There has been discussion regarding PICC placement and TNA.   Laban Emperor. Zehr  10/10/2013, 9:54 AM  Pager number [phone]  GI ATTENDING  History, laboratories, x-rays reviewed. Patient personally seen and examined. Agree with H&P as outlined above. Patient presents with profound weight loss what appears to be a large rectosigmoid cancer with local invasion. Has urologic obstruction. Anemic. Agree with plans for transfusion, nephrostomy tubes, and we'll plan colonoscopy tomorrow in hopes of securing tissue diagnosis.The nature of the procedure, as well as the risks, benefits, and alternatives were carefully and thoroughly reviewed with the patient. Ample time for discussion and questions allowed. The patient understood, was satisfied, and agreed to proceed. Discussed with the patient as well as general surgery.  Docia Chuck. Geri Seminole., M.D. [facility] Division of  Gastroenterology

## 2013-10-10 NOTE — Progress Notes (Addendum)
Patient ID: Grant [name], male   DOB: March 27, 1956, 58 y.o.   MRN: [id number] [facility] PROGRESS NOTE  Nilson Tabora CWC:[id number] DOB: 01-17-1956 DOA: 10/09/2013 PCP: No PCP Per Patient  Brief narrative: 58 year old male with no significant past medical history who presented to [facility] ED 10/09/2013 with reports of intermittent diarrhea, significant weight loss of about 20 pounds in last month, unintentional. He did have intentional 150 pound weight loss over past few years. In ED, patient had CT abdomen and pelvis and chest and was found to have large mass in the rectosigmoid colon concerning for locally invasive colorectal cancer likely with contained perforation. There was also gas seen within the urinary bladder concerning for colovesical fistula. Small bilateral lower lobe pulmonary nodules were also seen. Patient was seen by surgery who recommended GI consultation and urology consultation, obtaining tumor markers, pre-albumin, no surgical intervention at this time. Urology also evaluated the patient and current plan is for possible percutaneous nephrostomy tube placement by interventional radiology.  Assessment and plan:  Principal problem: Locally advanced rectal cancer and possible contained perforation  The patient was seen and evaluated by surgery who recommended GI evaluation, obtaining tumor markers, prealbumin level. No surgical intervention at this time. Awaiting GI consult.  CEA is pending, CEA 19-9 is 10.3 (low), CEA 125 is 7.2 (WNL), AFP is 1.7 (WNL), pre-albumin is low at 5.5.  Appreciate oncology consult and recommendations, they will see the pt today  Started on cipro and flagyl on admission  Active Problems: Large pelvic mass with rectal cancer and possible bladder and right ureteral involvement  Per urology based on CT scan findings patient would be at high risk for a cystectomy and urinary diversion with primary surgical resection of the pelvic mass. We will  followup on oncology consult and recommendations Colovesical fistula  Per urology recommendation is for treatment of UTI and culture specific antibiotics. Patient will also need a repeat CT scan of pelvis and bladder with contrast in next couple of days to evaluate this fistula more definitively..  Right ureteral obstruction  Will likely need placement of a right percutaneous nephrostomy tube to relieve obstruction with subsequent antegrade right ureteral stent placement. Interventional radiology consulted Acute blood loss anemia  Likely secondary to advanced colorectal cancer  Patient is getting second unit of blood this morning.  Hemoglobin was 6, we will obtain posttransfusion hemoglobin Severe protein calorie malnutrition  Low albumin at 5.5, low albumin level at 2.1 and significant weight loss of 20 pounds in the past one month  Patient reports being hungry this morning and wants to eat so we advanced the diet. He may require TNA for nutritional support as well.  DVT prophylaxis: Heparin subQ  Code Status: full code  Family Communication: plan of care discussed with the patient Disposition Plan: home when stable   [name], MD  [facility] Pager [phone]  If 7PM-7AM, please contact night-coverage [email or web address] Password [email or web address] 10/10/2013, 11:24 AM   LOS: 1 day   Consultants:  Urology (Dr. Alinda Money)  Oncology  Surgery  Procedures:  None   Antibiotics:  Cipro 10/09/2013 -->  Flagyl 10/09/2013 -->  HPI/Subjective: No acute overnight events.  Objective: Filed Vitals:   10/10/13 0729 10/10/13 0815 10/10/13 0918 10/10/13 0945  BP: 115/66 112/61 112/67 123/72  Pulse: 88 79 80 87  Temp: 98.6 F (37 C) 97.8 F (36.6 C) 98.8 F (37.1 C) 98.5 F (36.9 C)  TempSrc: Oral Oral Oral Oral  Resp: 16 16 16 16   Height:  Weight:      SpO2:  98% 99% 99%    Intake/Output Summary (Last 24 hours) at 10/10/13 1124 Last data filed at 10/10/13 0715   Gross per 24 hour  Intake   12.5 ml  Output      0 ml  Net   12.5 ml    Exam:   General:  Pt is alert, follows commands appropriately, not in acute distress  Cardiovascular: Regular rate and rhythm, S1/S2, no murmurs  Respiratory: Clear to auscultation bilaterally, no wheezing  Abdomen: Soft, non tender, non distended, bowel sounds present  Extremities: No edema, pulses DP and PT palpable bilaterally  Neuro: Grossly nonfocal  Data Reviewed: Basic Metabolic Panel:  Recent Labs Lab 10/09/13 1331 10/09/13 2138 10/10/13 0345  NA 130* 131* 132*  K 4.5 4.1 3.9  CL 88* 94* 94*  CO2 27 24 25   GLUCOSE 200* 206* 102*  BUN 20 18 16   CREATININE 1.28 1.14 1.14  CALCIUM 9.3 8.7 8.6  MG  --  1.9  --   PHOS  --  3.0  --    Liver Function Tests:  Recent Labs Lab 10/09/13 1331 10/09/13 2138 10/10/13 0345  AST 11 13 13   ALT 8 10 8   ALKPHOS 97 90 81  BILITOT 0.4 0.4 0.3  PROT 7.1 6.4 6.2  ALBUMIN 2.5* 2.2* 2.1*    Recent Labs Lab 10/09/13 1331  LIPASE 10*   No results found for this basename: AMMONIA,  in the last 168 hours CBC:  Recent Labs Lab 10/09/13 1331 10/10/13 0345  WBC 12.8* 8.4  NEUTROABS 12.0*  --   HGB 7.3* 6.0*  HCT 24.7* 19.7*  MCV 62.7* 62.3*  PLT 479* 377   Cardiac Enzymes:  Recent Labs Lab 10/09/13 1331  TROPONINI <0.30   BNP: No components found with this basename: POCBNP,  CBG: No results found for this basename: GLUCAP,  in the last 168 hours  No results found for this or any previous visit (from the past 240 hour(s)).   Studies: Ct Chest Wo Contrast 10/09/2013  IMPRESSION: There are 3 indeterminate pulmonary nodules as well as what is probably some minimal postinflammatory nodularity in the left lower lobe. Follow-up CT scan without contrast in 3 months is recommended given this patient's known colon cancer.     Ct Abdomen Pelvis W Contrast 10/09/2013  IMPRESSION: Large mass in the rectosigmoid colon concerning for locally  invasive colorectal cancer, likely with contained perforation. This appears to encase/ involve the mid to distal right ureter with right hydronephrosis/obstruction.  Gas within the urinary bladder, question catheterization. If the patient has not been catheterized, this would be concerning for colovesical fistula.  Small bilateral lower lobe pulmonary nodules, nonspecific. Recommend attention on followup imaging.    Dg Chest Portable 1 View 10/09/2013   IMPRESSION: No active disease.   Electronically Signed   By: Lajean Manes M.D.   On: 10/09/2013 14:00    Scheduled Meds: . ciprofloxacin  400 mg Intravenous Q12H  . metronidazole  500 mg Intravenous Q8H

## 2013-10-10 NOTE — H&P (Signed)
Chief Complaint: "Diarrhea and weight loss." Referring Physician: Dr. Alinda Money HPI: Grant [name] is an 58 y.o. male who presented after complaining of non-bloody diarrhea and cloudy urine with air. He has also noticed a decrease in his appetite and 25 lb weight loss x 1 month. CT was performed which revealed a large mass in the rectosigmoid colon concerning for locally invasive colorectal cancer, likely with contained perforation. This appears to encase/ involve the mid to distal right ureter with right hydronephrosis/obstruction. IR received request for image guided right percutaneous nephrostomy tube. He denies any chest pain or palpitations, but does admit to increased shortness of breath and fatigue with minimal exertion. He denies any active signs of bleeding or excessive bruising. He denies any recent fever or chills. The patient denies any history of sleep apnea or chronic oxygen use. He denies any known complications to sedation.   Past Medical History:  Past Medical History  Diagnosis Date  . Fecal incontinence     Past Surgical History:  Past Surgical History  Procedure Laterality Date  . Mandible surgery  1983    Family History: History reviewed. No pertinent family history.  Social History:  reports that he quit smoking about 25 years ago. His smokeless tobacco use includes Snuff. He reports that he uses illicit drugs (Marijuana). He reports that he does not drink alcohol.  Allergies:  Allergies  Allergen Reactions  . Strawberry Anaphylaxis and Hives  . Sunflower Oil Anaphylaxis and Hives    seeds  . Watermelon [Citrullus Vulgaris] Anaphylaxis and Hives    Medications:   Medication List    ASK your doctor about these medications       ALEVE PO  Take 2 tablets by mouth 2 (two) times daily as needed (arthritis).       Please HPI for pertinent positives, otherwise complete 10 system ROS negative.  Physical Exam: BP 123/72  Pulse 87  Temp(Src) 98.5 F (36.9 C)  (Oral)  Resp 16  Ht _0  (1.981 m)  Wt 156 lb (70.761 kg)  BMI 18.03 kg/m2  SpO2 99% Body mass index is 18.03 kg/(m^2).  General Appearance:  Alert, cooperative, no distress  Head:  Normocephalic, without obvious abnormality, atraumatic  Neck: Supple, symmetrical, trachea midline  Lungs:   Clear to auscultation bilaterally, no w/r/r, respirations unlabored without use of accessory muscles.  Chest Wall:  No tenderness or deformity  Heart:  Regular rate and rhythm, S1, S2 normal, no murmur, rub or gallop.  Abdomen:   Soft, non-tender, non distended, (+) BS  Extremities: Extremities normal, atraumatic, no cyanosis or edema  Pulses: 1+ and symmetric  Neurologic: Normal affect, no gross deficits.   Results for orders placed during the hospital encounter of 10/09/13 (from the past 48 hour(s))  CBC WITH DIFFERENTIAL     Status: Abnormal   Collection Time    10/09/13  1:31 PM      Result Value Ref Range   WBC 12.8 (*) 4.0 - 10.5 K/uL   RBC 3.94 (*) 4.22 - 5.81 MIL/uL   Hemoglobin 7.3 (*) 13.0 - 17.0 g/dL   Comment: REPEATED TO VERIFY   HCT 24.7 (*) 39.0 - 52.0 %   MCV 62.7 (*) 78.0 - 100.0 fL   MCH 18.5 (*) 26.0 - 34.0 pg   MCHC 29.6 (*) 30.0 - 36.0 g/dL   RDW 17.0 (*) 11.5 - 15.5 %   Platelets 479 (*) 150 - 400 K/uL   Comment: REPEATED TO VERIFY   Neutrophils Relative %  94 (*) 43 - 77 %   Lymphocytes Relative 3 (*) 12 - 46 %   Monocytes Relative 3  3 - 12 %   Eosinophils Relative 0  0 - 5 %   Basophils Relative 0  0 - 1 %   Neutro Abs 12.0 (*) 1.7 - 7.7 K/uL   Lymphs Abs 0.4 (*) 0.7 - 4.0 K/uL   Monocytes Absolute 0.4  0.1 - 1.0 K/uL   Eosinophils Absolute 0.0  0.0 - 0.7 K/uL   Basophils Absolute 0.0  0.0 - 0.1 K/uL   RBC Morphology ELLIPTOCYTES    COMPREHENSIVE METABOLIC PANEL     Status: Abnormal   Collection Time    10/09/13  1:31 PM      Result Value Ref Range   Sodium 130 (*) 137 - 147 mEq/L   Potassium 4.5  3.7 - 5.3 mEq/L   Chloride 88 (*) 96 - 112 mEq/L   CO2 27   19 - 32 mEq/L   Glucose, Bld 200 (*) 70 - 99 mg/dL   BUN 20  6 - 23 mg/dL   Creatinine, Ser 1.28  0.50 - 1.35 mg/dL   Calcium 9.3  8.4 - 10.5 mg/dL   Total Protein 7.1  6.0 - 8.3 g/dL   Albumin 2.5 (*) 3.5 - 5.2 g/dL   AST 11  0 - 37 U/L   ALT 8  0 - 53 U/L   Alkaline Phosphatase 97  39 - 117 U/L   Total Bilirubin 0.4  0.3 - 1.2 mg/dL   GFR calc non Af Amer 61 (*) >90 mL/min   GFR calc Af Amer 71 (*) >90 mL/min   Comment: (NOTE)     The eGFR has been calculated using the CKD EPI equation.     This calculation has not been validated in all clinical situations.     eGFR's persistently <90 mL/min signify possible Chronic Kidney     Disease.  TROPONIN I     Status: None   Collection Time    10/09/13  1:31 PM      Result Value Ref Range   Troponin I <0.30  <0.30 ng/mL   Comment:            Due to the release kinetics of cTnI,     a negative result within the first hours     of the onset of symptoms does not rule out     myocardial infarction with certainty.     If myocardial infarction is still suspected,     repeat the test at appropriate intervals.  PROTIME-INR     Status: None   Collection Time    10/09/13  1:31 PM      Result Value Ref Range   Prothrombin Time 14.8  11.6 - 15.2 seconds   INR 1.19  0.00 - 1.49  LIPASE, BLOOD     Status: Abnormal   Collection Time    10/09/13  1:31 PM      Result Value Ref Range   Lipase 10 (*) 11 - 59 U/L  TYPE AND SCREEN     Status: None   Collection Time    10/09/13  1:58 PM      Result Value Ref Range   ABO/RH(D) A POS     Antibody Screen NEG     Sample Expiration 10/12/2013     Unit Number [id number]     Blood Component Type RED CELLS,LR     Unit division  00     Status of Unit ISSUED     Transfusion Status OK TO TRANSFUSE     Crossmatch Result Compatible     Unit Number [id number]     Blood Component Type RBC LR PHER2     Unit division 00     Status of Unit ISSUED     Transfusion Status OK TO TRANSFUSE     Crossmatch  Result Compatible    ABO/RH     Status: None   Collection Time    10/09/13  1:58 PM      Result Value Ref Range   ABO/RH(D) A POS    POC OCCULT BLOOD, ED     Status: None   Collection Time    10/09/13  2:03 PM      Result Value Ref Range   Fecal Occult Bld NEGATIVE  NEGATIVE  URINALYSIS, ROUTINE W REFLEX MICROSCOPIC     Status: Abnormal   Collection Time    10/09/13  5:22 PM      Result Value Ref Range   Color, Urine YELLOW  YELLOW   APPearance TURBID (*) CLEAR   Specific Gravity, Urine 1.020  1.005 - 1.030   pH 7.0  5.0 - 8.0   Glucose, UA NEGATIVE  NEGATIVE mg/dL   Hgb urine dipstick LARGE (*) NEGATIVE   Bilirubin Urine NEGATIVE  NEGATIVE   Ketones, ur NEGATIVE  NEGATIVE mg/dL   Protein, ur 30 (*) NEGATIVE mg/dL   Urobilinogen, UA 0.2  0.0 - 1.0 mg/dL   Nitrite POSITIVE (*) NEGATIVE   Leukocytes, UA LARGE (*) NEGATIVE  URINE MICROSCOPIC-ADD ON     Status: Abnormal   Collection Time    10/09/13  5:22 PM      Result Value Ref Range   WBC, UA TOO NUMEROUS TO COUNT  <3 WBC/hpf   RBC / HPF 21-50  <3 RBC/hpf   Bacteria, UA MANY (*) RARE  CANCER ANTIGEN 19-9     Status: Abnormal   Collection Time    10/09/13  9:38 PM      Result Value Ref Range   CA 19-9 10.3 (*) <35.0 U/mL   Comment: Performed at [facility]  CA 125     Status: None   Collection Time    10/09/13  9:38 PM      Result Value Ref Range   CA 125 7.2  0.0 - 30.2 U/mL   Comment: Performed at [facility]  AFP TUMOR MARKER     Status: None   Collection Time    10/09/13  9:38 PM      Result Value Ref Range   AFP-Tumor Marker 1.7  0.0 - 8.0 ng/mL   Comment: (NOTE)     The Advia Centaur AFP immunoassay method is used.  Results obtained     with different assay methods or kits cannot be used interchangeably.     AFP is a valuable aid in the management of nonseminomatous testicular     cancer patients when used in conjunction with information available     from the clinical evaluation and other  diagnostic procedures.     Increased serum AFP concentrations have also been observed in ataxia     telangiectasia, hereditary tyrosinemia, primary hepatocellular     carcinoma, teratocarcinoma, gastrointestinal tract cancers with and     without liver metastases, and in benign hepatic conditions such as     acute viral hepatitis, chronic active hepatitis, and cirrhosis.  This     result cannot be interpreted as absolute evidence of the presence or     absence of malignant disease.  This result is not interpretable in     pregnant females.     Performed at [place] [facility]     Status: Abnormal   Collection Time    10/09/13  9:38 PM      Result Value Ref Range   Prealbumin 5.5 (*) 17.0 - 34.0 mg/dL   Comment: Performed at [place]     Status: Abnormal   Collection Time    10/09/13  9:38 PM      Result Value Ref Range   Sodium 131 (*) 137 - 147 mEq/L   Potassium 4.1  3.7 - 5.3 mEq/L   Chloride 94 (*) 96 - 112 mEq/L   CO2 24  19 - 32 mEq/L   Glucose, Bld 206 (*) 70 - 99 mg/dL   BUN 18  6 - 23 mg/dL   Creatinine, Ser 1.14  0.50 - 1.35 mg/dL   Calcium 8.7  8.4 - 10.5 mg/dL   Total Protein 6.4  6.0 - 8.3 g/dL   Albumin 2.2 (*) 3.5 - 5.2 g/dL   AST 13  0 - 37 U/L   ALT 10  0 - 53 U/L   Alkaline Phosphatase 90  39 - 117 U/L   Total Bilirubin 0.4  0.3 - 1.2 mg/dL   GFR calc non Af Amer 70 (*) >90 mL/min   GFR calc Af Amer 81 (*) >90 mL/min   Comment: (NOTE)     The eGFR has been calculated using the CKD EPI equation.     This calculation has not been validated in all clinical situations.     eGFR's persistently <90 mL/min signify possible Chronic Kidney     Disease.  MAGNESIUM     Status: None   Collection Time    10/09/13  9:38 PM      Result Value Ref Range   Magnesium 1.9  1.5 - 2.5 mg/dL  PHOSPHORUS     Status: None   Collection Time    10/09/13  9:38 PM      Result Value Ref Range   Phosphorus 3.0  2.3 - 4.6  mg/dL  APTT     Status: None   Collection Time    10/09/13  9:38 PM      Result Value Ref Range   aPTT 34  24 - 37 seconds  PROTIME-INR     Status: Abnormal   Collection Time    10/09/13  9:38 PM      Result Value Ref Range   Prothrombin Time 16.3 (*) 11.6 - 15.2 seconds   INR 1.34  0.00 - 1.49  TSH     Status: None   Collection Time    10/09/13  9:38 PM      Result Value Ref Range   TSH 0.571  0.350 - 4.500 uIU/mL   Comment: Please note change in reference range.  COMPREHENSIVE METABOLIC PANEL     Status: Abnormal   Collection Time    10/10/13  3:45 AM      Result Value Ref Range   Sodium 132 (*) 137 - 147 mEq/L   Potassium 3.9  3.7 - 5.3 mEq/L   Chloride 94 (*) 96 - 112 mEq/L   CO2 25  19 - 32 mEq/L   Glucose, Bld 102 (*) 70 -  99 mg/dL   BUN 16  6 - 23 mg/dL   Creatinine, Ser 1.14  0.50 - 1.35 mg/dL   Calcium 8.6  8.4 - 10.5 mg/dL   Total Protein 6.2  6.0 - 8.3 g/dL   Albumin 2.1 (*) 3.5 - 5.2 g/dL   AST 13  0 - 37 U/L   ALT 8  0 - 53 U/L   Alkaline Phosphatase 81  39 - 117 U/L   Total Bilirubin 0.3  0.3 - 1.2 mg/dL   GFR calc non Af Amer 70 (*) >90 mL/min   GFR calc Af Amer 81 (*) >90 mL/min   Comment: (NOTE)     The eGFR has been calculated using the CKD EPI equation.     This calculation has not been validated in all clinical situations.     eGFR's persistently <90 mL/min signify possible Chronic Kidney     Disease.  CBC     Status: Abnormal   Collection Time    10/10/13  3:45 AM      Result Value Ref Range   WBC 8.4  4.0 - 10.5 K/uL   RBC 3.16 (*) 4.22 - 5.81 MIL/uL   Hemoglobin 6.0 (*) 13.0 - 17.0 g/dL   Comment: REPEATED TO VERIFY     DELTA CHECK NOTED     CRITICAL RESULT CALLED TO, READ BACK BY AND VERIFIED WITH:     N IRISH,RN 10/10/13 0501 RHOLMES   HCT 19.7 (*) 39.0 - 52.0 %   MCV 62.3 (*) 78.0 - 100.0 fL   MCH 19.0 (*) 26.0 - 34.0 pg   MCHC 30.5  30.0 - 36.0 g/dL   RDW 17.0 (*) 11.5 - 15.5 %   Platelets 377  150 - 400 K/uL  PREPARE RBC  (CROSSMATCH)     Status: None   Collection Time    10/10/13  5:07 AM      Result Value Ref Range   Order Confirmation ORDER PROCESSED BY BLOOD BANK     Ct Chest Wo Contrast  10/09/2013   CLINICAL DATA:  Pulmonary nodules.  Colon cancer.  EXAM: CT CHEST WITHOUT CONTRAST  TECHNIQUE: Multidetector CT imaging of the chest was performed following the standard protocol without IV contrast.  COMPARISON:  CT scan dated 10/09/2013.  FINDINGS: There is an 8 x 4 mm nodule in the right upper lobe anteriorly on image 21 of series 3. There is a 5 mm nodule at the right lung base posteriorly on image 59 of series 3. There is a 6 mm nodule at the left lung base on image 54 of series 3 adjacent to the diaphragm. There is a vague area of multiple subtle nodularity in the left lower lobe laterally on images 49 through 52 which I suspect is inflammatory or postinflammatory. Heart and other mediastinal structures are normal. No hilar or mediastinal adenopathy. No osseous abnormality.  IMPRESSION: There are 3 indeterminate pulmonary nodules as well as what is probably some minimal postinflammatory nodularity in the left lower lobe. Follow-up CT scan without contrast in 3 months is recommended given this patient's known colon cancer.   Electronically Signed   By: Rozetta Nunnery M.D.   On: 10/09/2013 20:01   Ct Abdomen Pelvis W Contrast  10/09/2013   CLINICAL DATA:  Weight loss, diarrhea.  EXAM: CT ABDOMEN AND PELVIS WITH CONTRAST  TECHNIQUE: Multidetector CT imaging of the abdomen and pelvis was performed using the standard protocol following bolus administration of intravenous contrast.  CONTRAST:  119m OMNIPAQUE IOHEXOL 300 MG/ML  SOLN  COMPARISON:  None.  FINDINGS: 5 mm nodule noted in the right lower lobe posteriorly, nonspecific. Triangular nodular density at the left base along the left hemidiaphragm on image 9 measures 7 mm. No effusions. Heart is normal size.  There is a large heterogeneous complex mass in the pelvis in  the region of the rectosigmoid colon. This measures approximately 10.2 x 9.1 cm. Extraluminal locules of gas are noted posterior to the bladder concerning for a contained rupture. There is gas layering within the urinary bladder. This may be root related to recent catheterization. If the patient has not been catheterized, this would be concerning for colovesical fistula. There is right hydronephrosis and perinephric stranding with delayed excretion of contrast from the right kidney. The right ureter is dilated into the pelvis where it appears to be encased by the large rectosigmoid mass.  No evidence of a hepatic metastases. Tiny hypodensity in the dome of the liver is noted and nonspecific but most likely represents a small cyst. Spleen, pancreas, adrenals and left kidney are unremarkable. Small bowel is decompressed. Moderate stool throughout the colon, but no definitive evidence for colonic obstruction at this time.  No acute bony abnormality or focal bone lesion.  IMPRESSION: Large mass in the rectosigmoid colon concerning for locally invasive colorectal cancer, likely with contained perforation. This appears to encase/ involve the mid to distal right ureter with right hydronephrosis/obstruction.  Gas within the urinary bladder, question catheterization. If the patient has not been catheterized, this would be concerning for colovesical fistula.  Small bilateral lower lobe pulmonary nodules, nonspecific. Recommend attention on followup imaging.  These results were called by telephone at the time of interpretation on 10/09/2013 at 5:07 PM to Dr. DTawnya Crook who verbally acknowledged these results.   Electronically Signed   By: KRolm BaptiseM.D.   On: 10/09/2013 17:08   Dg Chest Portable 1 View  10/09/2013   CLINICAL DATA:  Weight loss ever since having the flu earlier this year.  EXAM: PORTABLE CHEST - 1 VIEW  COMPARISON:  None.  FINDINGS: Heart, mediastinum and hila are unremarkable. Lungs are clear. No pleural  effusion. No pneumothorax.  Bony thorax is intact.  IMPRESSION: No active disease.   Electronically Signed   By: DLajean ManesM.D.   On: 10/09/2013 14:00    Assessment/Plan Non-bloody diarrhea Weight loss Pneumaturia Large mass in rectosigmoid, CT reviewed. Right hydronephrosis Anemia, hgb 6.0 received 2 Units of PRBC today, check labs in am Request for image guided right nephrostomy tube placement with moderate sedation. Patient will be NPO after midnight, sq heparin held Risks and Benefits discussed with the patient. All of the patient's questions were answered, patient is agreeable to proceed. Consent signed and in chart.    KHedy JacobPA-C 10/10/2013, 12:03 PM

## 2013-10-10 NOTE — Progress Notes (Signed)
Subjective: He just got 2 units of blood and a cheeseburger, so he is relatively happy.  No complaints of pain.  I don't think he has seen Gi  Yet.    Objective: Vital signs in last 24 hours: Temp:  [97.8 F (36.6 C)-99.2 F (37.3 C)] 98.5 F (36.9 C) (05/28 0945) Pulse Rate:  [79-155] 87 (05/28 0945) Resp:  [14-25] 16 (05/28 0945) BP: (112-148)/(61-101) 123/72 mmHg (05/28 0945) SpO2:  [98 %-100 %] 99 % (05/28 0945) Weight:  [70.761 kg (156 lb)] 70.761 kg (156 lb) (05/27 2104) Last BM Date: 10/09/13 Tm 99.2,  VSS  H/H 6.0 19.7  Intake/Output from previous day:   Intake/Output this shift: Total I/O In: 12.5 [Blood:12.5] Out: -   General appearance: alert, cooperative and no distress GI: soft, non-tender; bowel sounds normal; no masses,  no organomegaly  Lab Results:   Recent Labs  10/09/13 1331 10/10/13 0345  WBC 12.8* 8.4  HGB 7.3* 6.0*  HCT 24.7* 19.7*  PLT 479* 377    BMET  Recent Labs  10/09/13 2138 10/10/13 0345  NA 131* 132*  K 4.1 3.9  CL 94* 94*  CO2 24 25  GLUCOSE 206* 102*  BUN 18 16  CREATININE 1.14 1.14  CALCIUM 8.7 8.6   PT/INR  Recent Labs  10/09/13 1331 10/09/13 2138  LABPROT 14.8 16.3*  INR 1.19 1.34     Recent Labs Lab 10/09/13 1331 10/09/13 2138 10/10/13 0345  AST 11 13 13   ALT 8 10 8   ALKPHOS 97 90 81  BILITOT 0.4 0.4 0.3  PROT 7.1 6.4 6.2  ALBUMIN 2.5* 2.2* 2.1*     Lipase     Component Value Date/Time   LIPASE 10* 10/09/2013 1331     Studies/Results: Ct Chest Wo Contrast  10/09/2013   CLINICAL DATA:  Pulmonary nodules.  Colon cancer.  EXAM: CT CHEST WITHOUT CONTRAST  TECHNIQUE: Multidetector CT imaging of the chest was performed following the standard protocol without IV contrast.  COMPARISON:  CT scan dated 10/09/2013.  FINDINGS: There is an 8 x 4 mm nodule in the right upper lobe anteriorly on image 21 of series 3. There is a 5 mm nodule at the right lung base posteriorly on image 59 of series 3. There is  a 6 mm nodule at the left lung base on image 54 of series 3 adjacent to the diaphragm. There is a vague area of multiple subtle nodularity in the left lower lobe laterally on images 49 through 52 which I suspect is inflammatory or postinflammatory. Heart and other mediastinal structures are normal. No hilar or mediastinal adenopathy. No osseous abnormality.  IMPRESSION: There are 3 indeterminate pulmonary nodules as well as what is probably some minimal postinflammatory nodularity in the left lower lobe. Follow-up CT scan without contrast in 3 months is recommended given this patient's known colon cancer.   Electronically Signed   By: Rozetta Nunnery M.D.   On: 10/09/2013 20:01   Ct Abdomen Pelvis W Contrast  10/09/2013   CLINICAL DATA:  Weight loss, diarrhea.  EXAM: CT ABDOMEN AND PELVIS WITH CONTRAST  TECHNIQUE: Multidetector CT imaging of the abdomen and pelvis was performed using the standard protocol following bolus administration of intravenous contrast.  CONTRAST:  155mL OMNIPAQUE IOHEXOL 300 MG/ML  SOLN  COMPARISON:  None.  FINDINGS: 5 mm nodule noted in the right lower lobe posteriorly, nonspecific. Triangular nodular density at the left base along the left hemidiaphragm on image 9 measures 7 mm. No effusions.  Heart is normal size.  There is a large heterogeneous complex mass in the pelvis in the region of the rectosigmoid colon. This measures approximately 10.2 x 9.1 cm. Extraluminal locules of gas are noted posterior to the bladder concerning for a contained rupture. There is gas layering within the urinary bladder. This may be root related to recent catheterization. If the patient has not been catheterized, this would be concerning for colovesical fistula. There is right hydronephrosis and perinephric stranding with delayed excretion of contrast from the right kidney. The right ureter is dilated into the pelvis where it appears to be encased by the large rectosigmoid mass.  No evidence of a hepatic  metastases. Tiny hypodensity in the dome of the liver is noted and nonspecific but most likely represents a small cyst. Spleen, pancreas, adrenals and left kidney are unremarkable. Small bowel is decompressed. Moderate stool throughout the colon, but no definitive evidence for colonic obstruction at this time.  No acute bony abnormality or focal bone lesion.  IMPRESSION: Large mass in the rectosigmoid colon concerning for locally invasive colorectal cancer, likely with contained perforation. This appears to encase/ involve the mid to distal right ureter with right hydronephrosis/obstruction.  Gas within the urinary bladder, question catheterization. If the patient has not been catheterized, this would be concerning for colovesical fistula.  Small bilateral lower lobe pulmonary nodules, nonspecific. Recommend attention on followup imaging.  These results were called by telephone at the time of interpretation on 10/09/2013 at 5:07 PM to Dr. Tawnya Crook, who verbally acknowledged these results.   Electronically Signed   By: Rolm Baptise M.D.   On: 10/09/2013 17:08   Dg Chest Portable 1 View  10/09/2013   CLINICAL DATA:  Weight loss ever since having the flu earlier this year.  EXAM: PORTABLE CHEST - 1 VIEW  COMPARISON:  None.  FINDINGS: Heart, mediastinum and hila are unremarkable. Lungs are clear. No pleural effusion. No pneumothorax.  Bony thorax is intact.  IMPRESSION: No active disease.   Electronically Signed   By: Lajean Manes M.D.   On: 10/09/2013 14:00    Medications: . ciprofloxacin  400 mg Intravenous Q12H  . heparin  5,000 Units Subcutaneous 3 times per day  . metronidazole  500 mg Intravenous Q8H  . sodium chloride  3 mL Intravenous Q12H   . sodium chloride     Prior to Admission medications   Medication Sig Start Date End Date Taking? Authorizing Provider  Naproxen Sodium (ALEVE PO) Take 2 tablets by mouth 2 (two) times daily as needed (arthritis).   Yes Historical Provider, MD      Assessment/Plan 1.  10.2 x 9.1 cm Mass, Locally advanced rectal cancer 2.  Right ureteral obstruction 3.  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  Pulmonary nodules   Plan:  We will follow and await GI and then discuss approach to treating this.  He may need to see Oncology also, but not till we have a tissue bx to discuss.   LOS: 1 day    Earnstine Regal 10/10/2013
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
## 2013-10-10 NOTE — Progress Notes (Signed)
Patient has been seen by GI and oncology.  Also, Dr. Karie Georges from Urology has seen him.  Plan for percutaneous nephrostomy tube tomorrow and possible colonoscopy.  Probably will not get surgery until next week.  Grant [name]. Dahlia Bailiff, MD, [place] [phone] [facility] Surgery

## 2013-10-11 ENCOUNTER — Inpatient Hospital Stay (HOSPITAL_COMMUNITY): Payer: Medicaid Other | Admitting: Certified Registered"

## 2013-10-11 ENCOUNTER — Inpatient Hospital Stay (HOSPITAL_COMMUNITY): Payer: Medicaid Other

## 2013-10-11 ENCOUNTER — Encounter (HOSPITAL_COMMUNITY)
Admission: EM | Disposition: A | Discharge status: Home Health | Payer: Self-pay | Source: Home / Self Care | Attending: Internal Medicine

## 2013-10-11 ENCOUNTER — Encounter (HOSPITAL_COMMUNITY): Payer: Self-pay | Admitting: *Deleted

## 2013-10-11 ENCOUNTER — Encounter (HOSPITAL_COMMUNITY): Payer: Medicaid Other | Admitting: Certified Registered"

## 2013-10-11 DIAGNOSIS — N1330 Unspecified hydronephrosis: Secondary | ICD-10-CM

## 2013-10-11 DIAGNOSIS — C801 Malignant (primary) neoplasm, unspecified: Secondary | ICD-10-CM

## 2013-10-11 DIAGNOSIS — R198 Other specified symptoms and signs involving the digestive system and abdomen: Secondary | ICD-10-CM

## 2013-10-11 DIAGNOSIS — D509 Iron deficiency anemia, unspecified: Secondary | ICD-10-CM

## 2013-10-11 HISTORY — DX: Malignant (primary) neoplasm, unspecified: C80.1

## 2013-10-11 HISTORY — PX: COLONOSCOPY: SHX5424

## 2013-10-11 LAB — CBC
HCT: 21.9 % — ABNORMAL LOW (ref 39.0–52.0)
Hemoglobin: 6.8 g/dL — CL (ref 13.0–17.0)
MCH: 20.5 pg — ABNORMAL LOW (ref 26.0–34.0)
MCHC: 31.1 g/dL (ref 30.0–36.0)
MCV: 66.2 fL — ABNORMAL LOW (ref 78.0–100.0)
PLATELETS: 387 10*3/uL (ref 150–400)
RBC: 3.31 MIL/uL — ABNORMAL LOW (ref 4.22–5.81)
RDW: 20.1 % — AB (ref 11.5–15.5)
WBC: 6.5 10*3/uL (ref 4.0–10.5)

## 2013-10-11 LAB — PREPARE RBC (CROSSMATCH)

## 2013-10-11 SURGERY — COLONOSCOPY
Anesthesia: Monitor Anesthesia Care

## 2013-10-11 MED ORDER — MIDAZOLAM HCL 2 MG/2ML IJ SOLN
INTRAMUSCULAR | Status: AC | PRN
  Administered 2013-10-11 (×2): 1 mg via INTRAVENOUS
  Administered 2013-10-11: 0.5 mg via INTRAVENOUS
  Administered 2013-10-11: 1 mg via INTRAVENOUS

## 2013-10-11 MED ORDER — SODIUM CHLORIDE 0.9 % IV SOLN: INTRAVENOUS | Status: DC

## 2013-10-11 MED ORDER — FENTANYL CITRATE 0.05 MG/ML IJ SOLN
INTRAMUSCULAR | Status: AC
  Filled 2013-10-11: qty 4

## 2013-10-11 MED ORDER — PROPOFOL INFUSION 10 MG/ML OPTIME
INTRAVENOUS | Status: DC | PRN
  Administered 2013-10-11: 100 ug/kg/min via INTRAVENOUS

## 2013-10-11 MED ORDER — BOOST / RESOURCE BREEZE PO LIQD
1.0000 | Freq: Three times a day (TID) | ORAL | Status: DC
  Administered 2013-10-12 – 2013-10-17 (×8): 1 via ORAL

## 2013-10-11 MED ORDER — SODIUM CHLORIDE 0.9 % IV SOLN
INTRAVENOUS | Status: DC | PRN
  Administered 2013-10-11: 12:00:00 via INTRAVENOUS

## 2013-10-11 MED ORDER — FENTANYL CITRATE 0.05 MG/ML IJ SOLN
INTRAMUSCULAR | Status: AC | PRN
  Administered 2013-10-11: 25 ug via INTRAVENOUS
  Administered 2013-10-11 (×3): 50 ug via INTRAVENOUS

## 2013-10-11 MED ORDER — IOHEXOL 300 MG/ML  SOLN
50.0000 mL | Freq: Once | INTRAMUSCULAR | Status: AC | PRN
  Administered 2013-10-11: 15 mL

## 2013-10-11 MED ORDER — MIDAZOLAM HCL 5 MG/5ML IJ SOLN
INTRAMUSCULAR | Status: DC | PRN
  Administered 2013-10-11: 1 mg via INTRAVENOUS

## 2013-10-11 MED ORDER — MIDAZOLAM HCL 2 MG/2ML IJ SOLN
INTRAMUSCULAR | Status: AC
  Filled 2013-10-11: qty 4

## 2013-10-11 MED ORDER — ONDANSETRON HCL 4 MG/2ML IJ SOLN: 4.0000 mg | Freq: Once | INTRAMUSCULAR | Status: DC | PRN

## 2013-10-11 MED ORDER — FENTANYL CITRATE 0.05 MG/ML IJ SOLN
INTRAMUSCULAR | Status: DC | PRN
  Administered 2013-10-11: 25 ug via INTRAVENOUS

## 2013-10-11 MED ORDER — HYDROMORPHONE HCL PF 1 MG/ML IJ SOLN: 0.2500 mg | INTRAMUSCULAR | Status: DC | PRN

## 2013-10-11 NOTE — Sedation Documentation (Signed)
Pt awakened to move to bed.  Denied pain

## 2013-10-11 NOTE — Anesthesia Preprocedure Evaluation (Addendum)
Anesthesia Evaluation  Patient identified by MRN, date of birth, ID band Patient awake    Reviewed: Allergy & Precautions, H&P , NPO status , Patient's Chart, lab work & pertinent test results  Airway       Dental   Pulmonary former smoker,          Cardiovascular     Neuro/Psych    GI/Hepatic   Endo/Other    Renal/GU      Musculoskeletal   Abdominal   Peds  Hematology  (+) anemia ,   Anesthesia Other Findings Colon CA  Reproductive/Obstetrics                         Anesthesia Physical Anesthesia Plan  ASA: I  Anesthesia Plan: MAC   Post-op Pain Management:    Induction: Intravenous  Airway Management Planned: Mask  Additional Equipment:   Intra-op Plan:   Post-operative Plan:   Informed Consent: I have reviewed the patients History and Physical, chart, labs and discussed the procedure including the risks, benefits and alternatives for the proposed anesthesia with the patient or authorized representative who has indicated his/her understanding and acceptance.     Plan Discussed with:   Anesthesia Plan Comments:         Anesthesia Quick Evaluation

## 2013-10-11 NOTE — Sedation Documentation (Signed)
Pt denied pain. 

## 2013-10-11 NOTE — Sedation Documentation (Signed)
Pt verbalized pain, given more sedation per Dr. Pascal Lux order

## 2013-10-11 NOTE — Procedures (Signed)
Successful [place] and fluoroscopic guided placement of a right sided PCN with end coiled and locked in the renal pelvis. PCN connected to gravity bag. No immediate post procedural complications.

## 2013-10-11 NOTE — Progress Notes (Addendum)
Patient ID: Grant [name], male   DOB: 12/09/1955, 58 y.o.   MRN: [id number] [facility] PROGRESS NOTE  Avrom Robarts HDQ:[id number] DOB: Oct 01, 1955 DOA: 10/09/2013 PCP: No PCP Per Patient  Brief narrative: 58 year old male with no significant past medical history who presented to [facility] ED 10/09/2013 with reports of intermittent diarrhea, significant weight loss of about 20 pounds in last month, unintentional. He did have intentional 150 pound weight loss over past few years. In ED, patient had CT abdomen and pelvis and chest and was found to have large mass in the rectosigmoid colon concerning for locally invasive colorectal cancer likely with contained perforation. There was also gas seen within the urinary bladder concerning for colovesical fistula. Small bilateral lower lobe pulmonary nodules were also seen. At this time, plan is for colonoscopy by GI and right percutaneous nephrostomy tube placement.  Assessment and plan:   Principal problem:  Locally advanced rectal cancer and possible contained perforation  The patient was seen and evaluated by surgery who recommended GI evaluation, obtaining tumor markers, prealbumin level. No surgical intervention at this time.  CEA was 3.7 WNL, CA 19-9 is 10.3 (low), CEA 125 is 7.2 (WNL), AFP is 1.7 (WNL), pre-albumin is low at 5.5.  Colonoscopy today by GI. Oncologic treatment options will be dependant on tissue pathology diagnosis.  Continue cipro and flagyl. Active Problems:  Large pelvic mass with rectal cancer and possible bladder and right ureteral obstruction Per urology based on CT scan findings patient would be at high risk for a cystectomy and urinary diversion with primary surgical resection of the pelvic mass.  Plan is however for right percutaneous nephrostomy tube placement to relieve obstruction with subsequent antegrade right ureteral stent placement Colovesical fistula  Per urology recommendation is for treatment of UTI (he is  on cipro). Patient will also need a repeat CT scan of pelvis and bladder with contrast in few of days to evaluate this fistula more definitively. Acute blood loss anemia  Likely secondary to advanced colorectal cancer  Status post 2 units of PRBC blood transfusion on admission. Hemoglobin is again low this morning at 6.8. We will put her the order for 2 more units of blood transfusion which can be done after the procedures. Severe protein calorie malnutrition  Low albumin at 5.5, low albumin level at 2.1 and significant weight loss of 20 pounds in the past one month  Patient is able to eat and says he is hungry. We will see if he needs to be on supplemental TNA.  DVT prophylaxis: heparin sub Q  Code Status: full code  Family Communication: plan of care discussed with the patient  Disposition Plan: home when stable   Consultants:  Urology (Dr. Alinda Money)  Oncology  Surgery Procedures:  None  Antibiotics:  Cipro 10/09/2013 -->  Flagyl 10/09/2013 -->   [name], MD  [facility] Pager [phone]  If 7PM-7AM, please contact night-coverage [email or web address] Password [email or web address] 10/11/2013, 11:01 AM   LOS: 2 days    HPI/Subjective: No acute overnight events.  Objective: Filed Vitals:   10/10/13 0945 10/10/13 1430 10/10/13 2116 10/11/13 0450  BP: 123/72 132/69 129/82 128/73  Pulse: 87 98 91 83  Temp: 98.5 F (36.9 C) 99 F (37.2 C) 98.9 F (37.2 C) 98.2 F (36.8 C)  TempSrc: Oral Oral Oral Oral  Resp: 16 18 17 16   Height:      Weight:    73.12 kg (161 lb 3.2 oz)  SpO2: 99% 100% 100% 100%  Intake/Output Summary (Last 24 hours) at 10/11/13 1101 Last data filed at 10/11/13 0700  Gross per 24 hour  Intake 5197.5 ml  Output    955 ml  Net 4242.5 ml    Exam:   General:  Pt is not in acute distress  Cardiovascular: Regular rate and rhythm, S1/S2, no murmurs  Respiratory: Clear to auscultation bilaterally, no wheezing  Abdomen: Soft, non tender, non distended,  bowel sounds present  Extremities: No edema, pulses DP and PT palpable bilaterally  Neuro: Grossly nonfocal  Data Reviewed: Basic Metabolic Panel:  Recent Labs Lab 10/09/13 1331 10/09/13 2138 10/10/13 0345  NA 130* 131* 132*  K 4.5 4.1 3.9  CL 88* 94* 94*  CO2 27 24 25   GLUCOSE 200* 206* 102*  BUN 20 18 16   CREATININE 1.28 1.14 1.14  CALCIUM 9.3 8.7 8.6  MG  --  1.9  --   PHOS  --  3.0  --    Liver Function Tests:  Recent Labs Lab 10/09/13 1331 10/09/13 2138 10/10/13 0345  AST 11 13 13   ALT 8 10 8   ALKPHOS 97 90 81  BILITOT 0.4 0.4 0.3  PROT 7.1 6.4 6.2  ALBUMIN 2.5* 2.2* 2.1*    Recent Labs Lab 10/09/13 1331  LIPASE 10*   No results found for this basename: AMMONIA,  in the last 168 hours CBC:  Recent Labs Lab 10/09/13 1331 10/10/13 0345 10/10/13 0502 10/11/13 0901  WBC 12.8* 8.4  --  6.5  NEUTROABS 12.0*  --   --   --   HGB 7.3* 6.0* 7.6* 6.8*  HCT 24.7* 19.7* 24.2* 21.9*  MCV 62.7* 62.3*  --  66.2*  PLT 479* 377  --  387   Cardiac Enzymes:  Recent Labs Lab 10/09/13 1331  TROPONINI <0.30   BNP: No components found with this basename: POCBNP,  CBG: No results found for this basename: GLUCAP,  in the last 168 hours  Recent Results (from the past 240 hour(s))  URINE CULTURE     Status: None   Collection Time    10/09/13  5:22 PM      Result Value Ref Range Status   Specimen Description URINE, CLEAN CATCH   Final   Special Requests ADDED [id number]   Final   Culture  Setup Time     Final   Value: 10/09/2013 22:53     Performed at [facility] PENDING   Incomplete   Culture     Final   Value: Culture reincubated for better growth     Performed at [facility]   Report Status PENDING   Incomplete     Studies: Ct Chest Wo Contrast 10/09/2013   IMPRESSION: There are 3 indeterminate pulmonary nodules as well as what is probably some minimal postinflammatory nodularity in the left lower lobe. Follow-up  CT scan without contrast in 3 months is recommended given this patient's known colon cancer.   Electronically Signed   By: Rozetta Nunnery M.D.   On: 10/09/2013 20:01   Ct Abdomen Pelvis W Contrast 10/09/2013    IMPRESSION: Large mass in the rectosigmoid colon concerning for locally invasive colorectal cancer, likely with contained perforation. This appears to encase/ involve the mid to distal right ureter with right hydronephrosis/obstruction.  Gas within the urinary bladder, question catheterization. If the patient has not been catheterized, this would be concerning for colovesical fistula.  Small bilateral lower lobe pulmonary nodules, nonspecific. Recommend attention on  followup imaging.  These results were called by telephone at the time of interpretation on 10/09/2013 at 5:07 PM to Dr. Tawnya Crook, who verbally acknowledged these results.   Electronically Signed   By: Rolm Baptise M.D.   On: 10/09/2013 17:08   Dg Chest Portable 1 View 10/09/2013  IMPRESSION: No active disease.   Electronically Signed   By: Lajean Manes M.D.   On: 10/09/2013 14:00    Scheduled Meds: . [facility] HOLD] ciprofloxacin  400 mg Intravenous Q12H  . [MAR HOLD] heparin  5,000 Units Subcutaneous 3 times per day  . [facility] HOLD] metronidazole  500 mg Intravenous Q8H  . [facility] HOLD] peg 3350 powder  0.5 kit Oral Once  . [facility] HOLD] sodium chloride  3 mL Intravenous Q12H   Continuous Infusions: . sodium chloride

## 2013-10-11 NOTE — Transfer of Care (Signed)
Immediate Anesthesia Transfer of Care Note  Patient: Grant [name]  Procedure(s) Performed: Procedure(s): COLONOSCOPY (N/A)  Patient Location: PACU  Anesthesia Type:MAC and General  Level of Consciousness: awake, alert  and oriented  Airway & Oxygen Therapy: Patient Spontanous Breathing  Post-op Assessment: Report given to PACU RN  Post vital signs: stable  Complications: No apparent anesthesia complications

## 2013-10-11 NOTE — Progress Notes (Signed)
Subjective: Comfortable. Alert. No distress. Tolerated bowel prep well. Just a little bit of pressure and a little bit of crampiness but no obstructive symptoms basically.  Anticipating colonoscopy with biopsy today  Anticipating right percutaneous nephrostomy today.  Objective: Vital signs in last 24 hours: Temp:  [98.2 F (36.8 C)-99 F (37.2 C)] 98.2 F (36.8 C) (05/29 0450) Pulse Rate:  [80-98] 83 (05/29 0450) Resp:  [16-18] 16 (05/29 0450) BP: (112-132)/(67-82) 128/73 mmHg (05/29 0450) SpO2:  [99 %-100 %] 100 % (05/29 0450) Weight:  [161 lb 3.2 oz (73.12 kg)] 161 lb 3.2 oz (73.12 kg) (05/29 0450) Last BM Date: 10/11/13  Intake/Output from previous day: 05/28 0701 - 05/29 0700 In: 4010 [P.O.:240; I.V.:2317.5; Blood:12.5] Out: 955 [Urine:950; Stool:5] Intake/Output this shift:     EXAM: General appearance: alert. Cooperative. Mental status normal. No distress. Very thin, almost cachectic. Resp: clear to auscultation bilaterally GI: abdomen soft. Scaphoid. Nontender. No mass.  Lab Results:   Recent Labs  10/09/13 1331 10/10/13 0345 10/10/13 0502  WBC 12.8* 8.4  --   HGB 7.3* 6.0* 7.6*  HCT 24.7* 19.7* 24.2*  PLT 479* 377  --    BMET  Recent Labs  10/09/13 2138 10/10/13 0345  NA 131* 132*  K 4.1 3.9  CL 94* 94*  CO2 24 25  GLUCOSE 206* 102*  BUN 18 16  CREATININE 1.14 1.14  CALCIUM 8.7 8.6   PT/INR  Recent Labs  10/09/13 1331 10/09/13 2138  LABPROT 14.8 16.3*  INR 1.19 1.34   ABG No results found for this basename: PHART, PCO2, PO2, HCO3,  in the last 72 hours  Studies/Results: Ct Chest Wo Contrast  10/09/2013   CLINICAL DATA:  Pulmonary nodules.  Colon cancer.  EXAM: CT CHEST WITHOUT CONTRAST  TECHNIQUE: Multidetector CT imaging of the chest was performed following the standard protocol without IV contrast.  COMPARISON:  CT scan dated 10/09/2013.  FINDINGS: There is an 8 x 4 mm nodule in the right upper lobe anteriorly on image 21 of  series 3. There is a 5 mm nodule at the right lung base posteriorly on image 59 of series 3. There is a 6 mm nodule at the left lung base on image 54 of series 3 adjacent to the diaphragm. There is a vague area of multiple subtle nodularity in the left lower lobe laterally on images 49 through 52 which I suspect is inflammatory or postinflammatory. Heart and other mediastinal structures are normal. No hilar or mediastinal adenopathy. No osseous abnormality.  IMPRESSION: There are 3 indeterminate pulmonary nodules as well as what is probably some minimal postinflammatory nodularity in the left lower lobe. Follow-up CT scan without contrast in 3 months is recommended given this patient's known colon cancer.   Electronically Signed   By: Rozetta Nunnery M.D.   On: 10/09/2013 20:01   Ct Abdomen Pelvis W Contrast  10/09/2013   CLINICAL DATA:  Weight loss, diarrhea.  EXAM: CT ABDOMEN AND PELVIS WITH CONTRAST  TECHNIQUE: Multidetector CT imaging of the abdomen and pelvis was performed using the standard protocol following bolus administration of intravenous contrast.  CONTRAST:  140mL OMNIPAQUE IOHEXOL 300 MG/ML  SOLN  COMPARISON:  None.  FINDINGS: 5 mm nodule noted in the right lower lobe posteriorly, nonspecific. Triangular nodular density at the left base along the left hemidiaphragm on image 9 measures 7 mm. No effusions. Heart is normal size.  There is a large heterogeneous complex mass in the pelvis in the region of the  rectosigmoid colon. This measures approximately 10.2 x 9.1 cm. Extraluminal locules of gas are noted posterior to the bladder concerning for a contained rupture. There is gas layering within the urinary bladder. This may be root related to recent catheterization. If the patient has not been catheterized, this would be concerning for colovesical fistula. There is right hydronephrosis and perinephric stranding with delayed excretion of contrast from the right kidney. The right ureter is dilated into the  pelvis where it appears to be encased by the large rectosigmoid mass.  No evidence of a hepatic metastases. Tiny hypodensity in the dome of the liver is noted and nonspecific but most likely represents a small cyst. Spleen, pancreas, adrenals and left kidney are unremarkable. Small bowel is decompressed. Moderate stool throughout the colon, but no definitive evidence for colonic obstruction at this time.  No acute bony abnormality or focal bone lesion.  IMPRESSION: Large mass in the rectosigmoid colon concerning for locally invasive colorectal cancer, likely with contained perforation. This appears to encase/ involve the mid to distal right ureter with right hydronephrosis/obstruction.  Gas within the urinary bladder, question catheterization. If the patient has not been catheterized, this would be concerning for colovesical fistula.  Small bilateral lower lobe pulmonary nodules, nonspecific. Recommend attention on followup imaging.  These results were called by telephone at the time of interpretation on 10/09/2013 at 5:07 PM to Dr. Tawnya Crook, who verbally acknowledged these results.   Electronically Signed   By: Rolm Baptise M.D.   On: 10/09/2013 17:08   Dg Chest Portable 1 View  10/09/2013   CLINICAL DATA:  Weight loss ever since having the flu earlier this year.  EXAM: PORTABLE CHEST - 1 VIEW  COMPARISON:  None.  FINDINGS: Heart, mediastinum and hila are unremarkable. Lungs are clear. No pleural effusion. No pneumothorax.  Bony thorax is intact.  IMPRESSION: No active disease.   Electronically Signed   By: Lajean Manes M.D.   On: 10/09/2013 14:00    Anti-infectives: Anti-infectives   Start     Dose/Rate Route Frequency Ordered Stop   10/09/13 2130  ciprofloxacin (CIPRO) IVPB 400 mg     400 mg 200 mL/hr over 60 Minutes Intravenous Every 12 hours 10/09/13 2108     10/09/13 2130  metroNIDAZOLE (FLAGYL) IVPB 500 mg     500 mg 100 mL/hr over 60 Minutes Intravenous Every 8 hours 10/09/13 2109     10/09/13  2045  ciprofloxacin (CIPRO) IVPB 400 mg  Status:  Discontinued     400 mg 200 mL/hr over 60 Minutes Intravenous Every 12 hours 10/09/13 2041 10/09/13 2103   10/09/13 2045  metroNIDAZOLE (FLAGYL) IVPB 500 mg  Status:  Discontinued     500 mg 100 mL/hr over 60 Minutes Intravenous Every 8 hours 10/09/13 2042 10/09/13 2109      Assessment/Plan: s/p Procedure(s): COLONOSCOPY  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 for colonoscopy with biopsy today.  Right ureteral structure and and probable colovesical fistula. 4 right percutaneous nephrostomy today  Severe protein calorie malnutrition. Nutrition consult.  Multidisciplinary decision will need to be made as to whether he gets neoadjuvant chemotherapy and radiation therapy( my recommendation), with or without diverting colostomy, or whether to attempt resection prior to systemic therapy. I have added his case onto the next Wednesday morning GI tumor Board for discussion.    Edsel Petrin. Dalbert Batman, M.D., [facility] General and Minimally invasive Surgery Breast and Colorectal Surgery Office:  [phone] Pager:   [phone]   LOS: 2 days    Adin Hector 10/11/2013

## 2013-10-11 NOTE — Progress Notes (Signed)
INITIAL NUTRITION ASSESSMENT  DOCUMENTATION CODES Per approved criteria  -Severe malnutrition in the context of chronic illness -Underweight  Pt meets criteria for SEVERE MALNUTRITION in the context of CHRONIC ILLNESS as evidenced by 8% weight loss in less than one month, severe muscle wasting, and severe fat wasting.  INTERVENTION: Provide Resource Breeze TID with meals until diet advanced Diet advancement per MD discretion Provide Boost Plus BID when diet advanced If pt continues to lose weight or if pt needs to remain NPO, recommend initiation of TPN to provide >/=75% of estimated energy/protein needs  NUTRITION DIAGNOSIS: Unintentional weight loss related to diarrhea and rectal cancer as evidenced by >8% weight loss in less than one month.   Goal: Pt to meet >/= 90% of their estimated nutrition needs  Weight gain  Monitor:  Diet advancement, PO intake, weight trend, labs, bowel function, ?nutrition support  Reason for Assessment: MST/ Consult  58 y.o. male  Admitting Dx: Rectal mass  ASSESSMENT: 58 y.o. male who presented after complaining of non-bloody diarrhea and cloudy urine with air. He has also noticed a decrease in his appetite and 25 lb weight loss x 1 month. CT was performed which revealed a large mass in the rectosigmoid colon concerning for locally invasive colorectal cancer, likely with contained perforation. This appears to encase/ involve the mid to distal right ureter with right hydronephrosis/obstruction. IR received request for image guided right percutaneous nephrostomy tube.  Pt reports that he was weighing 170 lbs about one month ago and hit his lowest weight of 156 lbs just PTA; he reports 25 lb weight loss this past month. Based on pt's report he has had >8% weight loss in the past month. For the past month he has had significant diarrhea daily; he feels that foods he eats are going straight through him and not being absorbed. Pt states that he has had a  good appetite and has been eating well. He would like to eat today but, is currently NPO for procedure. He relates weight loss to diarrhea. Pt had to run to bathroom at time of visit. Family at bedside provided some history. They report pt has been eating less than usual for the past 2 weeks due to diarrhea. Per family, PO intake recently has included toast, crackers, broth, and occasional sandwich and pt has been drinking Boost High Protein twice daily.  Unable to perform full physical exam at time time but, pt has obvious severe wasting of arms and clavicles and moderate wasting of temples.  Note food allergies per chart: Strawberries, watermelon, and sunflower seed oil Labs: very low hemoglobin, low sodium, low chloride, low albumin, decreased GFR  Height: Ht Readings from Last 1 Encounters:  10/09/13 6\' 6"  (1.981 m)    Weight: Wt Readings from Last 1 Encounters:  10/11/13 161 lb 3.2 oz (73.12 kg)    Ideal Body Weight: 214 lbs  % Ideal Body Weight: 75%  Wt Readings from Last 10 Encounters:  10/11/13 161 lb 3.2 oz (73.12 kg)  10/11/13 161 lb 3.2 oz (73.12 kg)  Pt reports weight of 156 lbs PTA  Usual Body Weight: 170 lbs  % Usual Body Weight: 92%  BMI:  Body mass index is 18.02 kg/(m^2).  Estimated Nutritional Needs: Kcal: 2200-2500 Protein: 100-115 grams Fluid: 2.3 L/day  Skin: intact  Diet Order: Clear Liquid  EDUCATION NEEDS: -No education needs identified at this time   Intake/Output Summary (Last 24 hours) at 10/11/13 1446 Last data filed at 10/11/13 1233  Gross  per 24 hour  Intake 5397.5 ml  Output    655 ml  Net 4742.5 ml    Last BM: 5/29, diarrhea  Labs:   Recent Labs Lab 10/09/13 1331 10/09/13 2138 10/10/13 0345  NA 130* 131* 132*  K 4.5 4.1 3.9  CL 88* 94* 94*  CO2 _0 BUN _1 CREATININE 1.28 1.14 1.14  CALCIUM 9.3 8.7 8.6  MG  --  1.9  --   PHOS  --  3.0  --   GLUCOSE 200* 206* 102*    CBG (last 3)  No results found for  this basename: GLUCAP,  in the last 72 hours  Scheduled Meds: . ciprofloxacin  400 mg Intravenous Q12H  . [START ON 10/12/2013] heparin  5,000 Units Subcutaneous 3 times per day  . metronidazole  500 mg Intravenous Q8H  . peg 3350 powder  0.5 kit Oral Once  . sodium chloride  3 mL Intravenous Q12H    Continuous Infusions: . sodium chloride      Past Medical History  Diagnosis Date  . Fecal incontinence     Past Surgical History  Procedure Laterality Date  . Mandible surgery  Sour John, LDN Inpatient Clinical Dietitian Pager: [phone] After Hours Pager: [phone]

## 2013-10-11 NOTE — Sedation Documentation (Signed)
Pt w/ eyes open prone, denied pain

## 2013-10-11 NOTE — Sedation Documentation (Signed)
Pt having significant pain, moaning, lifing head, grimacing.

## 2013-10-11 NOTE — Progress Notes (Signed)
At 1030, h/h reported to Dr. Charlies Silvers, she will put in orders.

## 2013-10-11 NOTE — Interval H&P Note (Signed)
History and Physical Interval Note:  10/11/2013 11:41 AM  Grant [name]  has presented today for surgery, with the diagnosis of colon mass  The various methods of treatment have been discussed with the patient and family. After consideration of risks, benefits and other options for treatment, the patient has consented to  Procedure(s): COLONOSCOPY (N/A) as a surgical intervention .  The patient's history has been reviewed, patient examined, no change in status, stable for surgery.  I have reviewed the patient's chart and labs.  Questions were answered to the patient's satisfaction.     [name]

## 2013-10-11 NOTE — H&P (View-Only) (Signed)
Completed 2/3 of a golytley prep last night, finished about 750 ml of the movi prep this AM.  Too nauseated to take more prep.  Stool is watery with some sediment of solid stool.  No blood. Encouraged him to finish the remaining movi prep  Colonoscopy set for noon today to evaluate rectal mass, weight loss.  Vena Rua

## 2013-10-11 NOTE — Progress Notes (Signed)
Returned from endo at 1330, iv antibiotic due and hung.  Plan is for IR within the next hour. Pt is stable, will hold transfusion of 2u PRBC  Until IR procedure done.

## 2013-10-11 NOTE — Progress Notes (Signed)
Completed 2/3 of a golytley prep last night, finished about 750 ml of the movi prep this AM.  Too nauseated to take more prep.  Stool is watery with some sediment of solid stool.  No blood. Encouraged him to finish the remaining movi prep  Colonoscopy set for noon today to evaluate rectal mass, weight loss.  Khailee Mick J Nakhi Choi  

## 2013-10-11 NOTE — Op Note (Signed)
[facility] [place], [address]   COLONOSCOPY PROCEDURE REPORT  PATIENT: Grant [name], Grant [name]  MR#: [id number] BIRTHDATE: 05-10-1956 , 28  yrs. old GENDER: Male ENDOSCOPIST: Eustace Quail, MD REFERRED [id number] PROCEDURE DATE:  10/11/2013 PROCEDURE:   Colonoscopy with biopsy First Screening Colonoscopy - Avg.  risk and is 50 yrs.  old or older - No.  Prior Negative Screening - Now for repeat screening. N/A  History of Adenoma - Now for follow-up colonoscopy & has been > or = to 3 yrs.  N/A  Polyps Removed Today? Yes. ASA CLASS:   Class II INDICATIONS:an abnormal CT. MEDICATIONS: MAC sedation, administered by CRNA and See Anesthesia Report.  DESCRIPTION OF PROCEDURE:   After the risks benefits and alternatives of the procedure were thoroughly explained, informed consent was obtained.  A digital rectal exam revealed no abnormalities of the rectum.   The Pentax Adult Colon [id number] endoscope was introduced through the anus and advanced to the rectum. Limited by an obstruction.   The quality of the prep was Moviprep fair  The instrument was then slowly withdrawn as the colon was fully examined.      COLON FINDINGS: There was a lumen occluding large friable malignant-appearing mass in the rectum at approximately 9 cm from the anal verge.  The scope could not be passed beyond.  Multiple biopsies taken.  Retroflexion was not performed. The time to cecum=did not reach.  Withdrawal time=not applicable.  The scope was withdrawn and the procedure completed. COMPLICATIONS: There were no complications.  ENDOSCOPIC IMPRESSION: There was a lumen occluding large friable malignant-appearing mass in the rectum at approximately 9 cm from the anal verge.  The scope could not be passed beyond.  Multiple biopsies taken  RECOMMENDATIONS: 1.Await biopsy results 2. Plans per general surgery/urology.  Results discussed with patient.GI will  sign off. Thank you   eSigned:  Eustace Quail, MD 10/11/2013 12:37 PM   cc: Judeth Horn, MD

## 2013-10-12 LAB — URINE CULTURE: Colony Count: >=100000

## 2013-10-12 LAB — CBC
HEMATOCRIT: 28.2 % — AB (ref 39.0–52.0)
HEMOGLOBIN: 8.9 g/dL — AB (ref 13.0–17.0)
MCH: 21.9 pg — ABNORMAL LOW (ref 26.0–34.0)
MCHC: 31.6 g/dL (ref 30.0–36.0)
MCV: 69.5 fL — ABNORMAL LOW (ref 78.0–100.0)
Platelets: 381 10*3/uL (ref 150–400)
RBC: 4.06 MIL/uL — ABNORMAL LOW (ref 4.22–5.81)
RDW: 21.9 % — ABNORMAL HIGH (ref 11.5–15.5)
WBC: 7.7 10*3/uL (ref 4.0–10.5)

## 2013-10-12 LAB — GLUCOSE, CAPILLARY: Glucose-Capillary: 121 mg/dL — ABNORMAL HIGH (ref 70–99)

## 2013-10-12 MED ORDER — MORPHINE SULFATE 2 MG/ML IJ SOLN
2.0000 mg | INTRAMUSCULAR | Status: DC | PRN
  Administered 2013-10-12 (×4): 4 mg via INTRAVENOUS
  Administered 2013-10-12: 2 mg via INTRAVENOUS
  Administered 2013-10-13 (×4): 4 mg via INTRAVENOUS
  Administered 2013-10-13: 2 mg via INTRAVENOUS
  Administered 2013-10-13 – 2013-10-14 (×3): 4 mg via INTRAVENOUS
  Administered 2013-10-14: 2 mg via INTRAVENOUS
  Administered 2013-10-14 – 2013-10-15 (×4): 4 mg via INTRAVENOUS
  Filled 2013-10-12 (×2): qty 2
  Filled 2013-10-12: qty 1
  Filled 2013-10-12 (×6): qty 2
  Filled 2013-10-12: qty 1
  Filled 2013-10-12 (×2): qty 2
  Filled 2013-10-12: qty 1
  Filled 2013-10-12 (×5): qty 2

## 2013-10-12 MED ORDER — ALPRAZOLAM 0.5 MG PO TABS
0.5000 mg | ORAL_TABLET | Freq: Once | ORAL | Status: AC
  Administered 2013-10-12: 0.5 mg via ORAL
  Filled 2013-10-12: qty 1

## 2013-10-12 MED ORDER — ZOLPIDEM TARTRATE 5 MG PO TABS
5.0000 mg | ORAL_TABLET | Freq: Every day | ORAL | Status: DC
  Filled 2013-10-12: qty 1

## 2013-10-12 NOTE — Progress Notes (Signed)
Patient ID: Grant [name], male   DOB: 1955/07/11, 58 y.o.   MRN: [id number] [facility] Progress Note:   1 Day Post-Op  Subjective: Mental status is clear and pleasant Objective: Vital signs in last 24 hours: Temp:  [97.3 F (36.3 C)-98.9 F (37.2 C)] 98.1 F (36.7 C) (05/30 0646) Pulse Rate:  [67-96] 72 (05/30 0646) Resp:  [10-19] 18 (05/30 0646) BP: (111-142)/(60-83) 125/75 mmHg (05/30 0646) SpO2:  [94 %-100 %] 99 % (05/30 0646)  Intake/Output from previous day: 05/29 0701 - 05/30 0700 In: 3673 [P.O.:240; I.V.:2453; Blood:980] Out: 1200 [Urine:1200] Intake/Output this shift:    Physical Exam: Work of breathing is not labored.  No abdominal pain complaints  Lab Results:  Results for orders placed during the hospital encounter of 10/09/13 (from the past 48 hour(s))  CBC     Status: Abnormal   Collection Time    10/11/13  9:01 AM      Result Value Ref Range   WBC 6.5  4.0 - 10.5 K/uL   RBC 3.31 (*) 4.22 - 5.81 MIL/uL   Hemoglobin 6.8 (*) 13.0 - 17.0 g/dL   Comment: REPEATED TO VERIFY     CRITICAL RESULT CALLED TO, READ BACK BY AND VERIFIED WITH:     [name],RN AT 1003 10/11/13 BY ZBEECH.   HCT 21.9 (*) 39.0 - 52.0 %   MCV 66.2 (*) 78.0 - 100.0 fL   MCH 20.5 (*) 26.0 - 34.0 pg   MCHC 31.1  30.0 - 36.0 g/dL   RDW 20.1 (*) 11.5 - 15.5 %   Platelets 387  150 - 400 K/uL  PREPARE RBC (CROSSMATCH)     Status: None   Collection Time    10/11/13 11:17 AM      Result Value Ref Range   Order Confirmation ORDER PROCESSED BY BLOOD BANK    CBC     Status: Abnormal   Collection Time    10/12/13  4:15 AM      Result Value Ref Range   WBC 7.7  4.0 - 10.5 K/uL   RBC 4.06 (*) 4.22 - 5.81 MIL/uL   Hemoglobin 8.9 (*) 13.0 - 17.0 g/dL   Comment: POST TRANSFUSION SPECIMEN   HCT 28.2 (*) 39.0 - 52.0 %   MCV 69.5 (*) 78.0 - 100.0 fL   MCH 21.9 (*) 26.0 - 34.0 pg   MCHC 31.6  30.0 - 36.0 g/dL   RDW 21.9 (*) 11.5 - 15.5 %   Platelets 381  150 - 400 K/uL  GLUCOSE,  CAPILLARY     Status: Abnormal   Collection Time    10/12/13  8:05 AM      Result Value Ref Range   Glucose-Capillary 121 (*) 70 - 99 mg/dL    Radiology/Results: Ir Perc Nephrostomy Right  10/11/2013   INDICATION: History of obstructive rectal mass with associated mild right-sided ureterectasis and pelvicaliectasis, not suitable for retrograde ureteral stent placement per urologic consultation. Please perform ultrasound and fluoroscopic guided percutaneous nephrostomy catheter placement to preserve right-sided renal function and avoid suspected impending obstructive uropathy.  EXAM: 1. ULTRASOUND GUIDANCE FOR PUNCTURE OF THE RIGHT RENAL COLLECTING SYSTEM 2. RIGHT PERCUTANEOUS NEPHROSTOMY TUBE PLACEMENT.  COMPARISON:  CT abdomen pelvis - 10/09/2013  MEDICATIONS: The patient is currently admitted to the hospital and receiving intravenous antibiotics. The antibiotic was administered in an appropriate time frame prior to skin puncture.  ANESTHESIA/SEDATION: Fentanyl 175 mcg IV; Versed 3.5 mg IV  Total Moderate Sedation Time  30  minutes.  CONTRAST:  20 mL Isovue 300 administered into the collecting system  FLUOROSCOPY TIME:  3 minutes 48 seconds.  COMPLICATIONS: None immediate  PROCEDURE: The procedure, risks, benefits, and alternatives were explained to the patient. Questions regarding the procedure were encouraged and answered. The patient understands and consents to the procedure. A timeout was performed prior to the initiation of the procedure.  The right flank region was prepped with Betadine in a sterile fashion, and a sterile drape was applied covering the operative field. A sterile gown and sterile gloves were used for the procedure. Local anesthesia was provided with 1% Lidocaine with epinephrine. Ultrasound was used to localize the right kidney. Under direct ultrasound guidance, a 21 gauge needle was advanced into the renal collecting system. An ultrasound image documentation was performed. Access  within the collecting system was confirmed with the efflux of urine followed by limited contrast injection.  Over a Nitrex wire, the tract was dilated with an Accustick stent. Over a guide wire, a 10-French percutaneous nephrostomy catheter was advanced into the collecting system where the coil was formed and locked. Contrast was injected and several sport radiographs were obtained in various obliquities confirming access. The catheter was secured at the skin with a Prolene retention suture and a gravity bag was placed. A dressing was placed. The patient tolerated procedure well without immediate postprocedural complication.  FINDINGS: Ultrasound scanning demonstrates very mild right-sided pelvicaliectasis, similar to preprocedural abdominal CT.  Under direct ultrasound guidance, a posterior inferior calix was targeted allowing advancement of an 10-French percutaneous nephrostomy catheter under intermittent fluoroscopic guidance. Contrast injection confirmed appropriate positioning.  IMPRESSION: Successful ultrasound and fluoroscopic guided placement of a right sided 10 French PCN.   Electronically Signed   By: Sandi Mariscal M.D.   On: 10/11/2013 18:08   Ir US Guide Bx Asp/drain  10/11/2013   INDICATION: History of obstructive rectal mass with associated mild right-sided ureterectasis and pelvicaliectasis, not suitable for retrograde ureteral stent placement per urologic consultation. Please perform ultrasound and fluoroscopic guided percutaneous nephrostomy catheter placement to preserve right-sided renal function and avoid suspected impending obstructive uropathy.  EXAM: 1. ULTRASOUND GUIDANCE FOR PUNCTURE OF THE RIGHT RENAL COLLECTING SYSTEM 2. RIGHT PERCUTANEOUS NEPHROSTOMY TUBE PLACEMENT.  COMPARISON:  CT abdomen pelvis - 10/09/2013  MEDICATIONS: The patient is currently admitted to the hospital and receiving intravenous antibiotics. The antibiotic was administered in an appropriate time frame prior to skin  puncture.  ANESTHESIA/SEDATION: Fentanyl 175 mcg IV; Versed 3.5 mg IV  Total Moderate Sedation Time  30 minutes.  CONTRAST:  20 mL Isovue 300 administered into the collecting system  FLUOROSCOPY TIME:  3 minutes 48 seconds.  COMPLICATIONS: None immediate  PROCEDURE: The procedure, risks, benefits, and alternatives were explained to the patient. Questions regarding the procedure were encouraged and answered. The patient understands and consents to the procedure. A timeout was performed prior to the initiation of the procedure.  The right flank region was prepped with Betadine in a sterile fashion, and a sterile drape was applied covering the operative field. A sterile gown and sterile gloves were used for the procedure. Local anesthesia was provided with 1% Lidocaine with epinephrine. Ultrasound was used to localize the right kidney. Under direct ultrasound guidance, a 21 gauge needle was advanced into the renal collecting system. An ultrasound image documentation was performed. Access within the collecting system was confirmed with the efflux of urine followed by limited contrast injection.  Over a Nitrex wire, the tract was dilated with an  Accustick stent. Over a guide wire, a 10-French percutaneous nephrostomy catheter was advanced into the collecting system where the coil was formed and locked. Contrast was injected and several sport radiographs were obtained in various obliquities confirming access. The catheter was secured at the skin with a Prolene retention suture and a gravity bag was placed. A dressing was placed. The patient tolerated procedure well without immediate postprocedural complication.  FINDINGS: Ultrasound scanning demonstrates very mild right-sided pelvicaliectasis, similar to preprocedural abdominal CT.  Under direct ultrasound guidance, a posterior inferior calix was targeted allowing advancement of an 10-French percutaneous nephrostomy catheter under intermittent fluoroscopic guidance.  Contrast injection confirmed appropriate positioning.  IMPRESSION: Successful ultrasound and fluoroscopic guided placement of a right sided 10 French PCN.   Electronically Signed   By: Sandi Mariscal M.D.   On: 10/11/2013 18:08    Anti-infectives: Anti-infectives   Start     Dose/Rate Route Frequency Ordered Stop   10/09/13 2130  ciprofloxacin (CIPRO) IVPB 400 mg     400 mg 200 mL/hr over 60 Minutes Intravenous Every 12 hours 10/09/13 2108     10/09/13 2130  metroNIDAZOLE (FLAGYL) IVPB 500 mg     500 mg 100 mL/hr over 60 Minutes Intravenous Every 8 hours 10/09/13 2109     10/09/13 2045  ciprofloxacin (CIPRO) IVPB 400 mg  Status:  Discontinued     400 mg 200 mL/hr over 60 Minutes Intravenous Every 12 hours 10/09/13 2041 10/09/13 2103   10/09/13 2045  metroNIDAZOLE (FLAGYL) IVPB 500 mg  Status:  Discontinued     500 mg 100 mL/hr over 60 Minutes Intravenous Every 8 hours 10/09/13 2042 10/09/13 2109      Assessment/Plan: Problem List: Patient Active Problem List   Diagnosis Date Noted  . 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 Ureteral obstruction, right 10/10/2013  . Anemia of chronic disease 10/10/2013  . Severe protein-calorie malnutrition 10/10/2013  . Microcytic anemia 10/10/2013  . Rectal mass 10/10/2013  . Colon cancer 10/09/2013    Will likely benefit from diverting colostomy to relieve obstruction from rectal cancer.   1 Day Post-Op    LOS: 3 days   Matt B. Hassell Done, MD, [facility] [phone] beeper [phone]  10/12/2013 8:32 AM

## 2013-10-12 NOTE — Progress Notes (Signed)
NUTRITION FOLLOW UP/NEW TPN  Intervention:   - TPN per pharmacy - Diet advancement per MD - RD to continue to monitor   Nutrition Dx:   Unintentional weight loss related to diarrhea and rectal cancer as evidenced by >8% weight loss in less than one month - ongoing   Goal:   -Pt to meet >/= 90% of their estimated nutrition needs - not met, pt with minimal intake of clear liquids and TPN not yet started -Weight gain - unsure if pt gaining weight as no new weights since yesterday    Monitor:   Weights, labs, TPN, intake, diet advancement  Assessment:   58 y.o. male who presented after complaining of non-bloody diarrhea and cloudy urine with air. He has also noticed a decrease in his appetite and 25 lb weight loss x 1 month. CT was performed which revealed a large mass in the rectosigmoid colon concerning for locally invasive colorectal cancer, likely with contained perforation. This appears to encase/ involve the mid to distal right ureter with right hydronephrosis/obstruction. IR received request for image guided right percutaneous nephrostomy tube.   5/29: Pt reports that he was weighing 170 lbs about one month ago and hit his lowest weight of 156 lbs just PTA; he reports 25 lb weight loss this past month. Based on pt's report he has had >8% weight loss in the past month. For the past month he has had significant diarrhea daily; he feels that foods he eats are going straight through him and not being absorbed. Pt states that he has had a good appetite and has been eating well. He would like to eat today but, is currently NPO for procedure. He relates weight loss to diarrhea. Pt had to run to bathroom at time of visit. Family at bedside provided some history. They report pt has been eating less than usual for the past 2 weeks due to diarrhea. Per family, PO intake recently has included toast, crackers, broth, and occasional sandwich and pt has been drinking Boost High Protein twice daily.   Unable to perform full physical exam at time time but, pt has obvious severe wasting of arms and clavicles and moderate wasting of temples.  Note food allergies per chart: Strawberries, watermelon, and sunflower seed oil  Labs: very low hemoglobin, low sodium, low chloride, low albumin, decreased GFR  5/30: Received page from pharmacist on weekend pager regarding MD order for TPN, wanted to see if RD thought it was appropriate. Per review of notes and conversation with RN and MD, pt has "lumen occluding large friable malignant-appearing mass in the rectum at approximately 9 cm from the anal verge. The scope could not be passed beyond" per colonoscopy report, and there are plans from surgery for diverting colostomy to relieve obstruction. Since pt has severe malnutrition and obstructing rectal mass, agree with MD recommendation for TPN, discussed with pharmacist. Per RN, pt without nausea and vomiting today, only intake was a few sips of Resource Breeze. Hospitalist told RD that GI wants to keep pt on clear liquids.    Height: Ht Readings from Last 1 Encounters:  10/09/13 _0  (1.981 m)    Weight Status:   Wt Readings from Last 1 Encounters:  10/11/13 161 lb 3.2 oz (73.12 kg)    Re-estimated needs:  Kcal: 2200-2500  Protein: 100-115 grams  Fluid: 2.3 L/day   Skin: intact   Diet Order: Clear Liquid   Intake/Output Summary (Last 24 hours) at 10/12/13 1403 Last data filed at 10/12/13  1303  Gross per 24 hour  Intake   4073 ml  Output   1600 ml  Net   2473 ml    Last BM: 5/30 diarrhea, very small amount per RN   Labs:   Recent Labs Lab 10/09/13 1331 10/09/13 2138 10/10/13 0345  NA 130* 131* 132*  K 4.5 4.1 3.9  CL 88* 94* 94*  CO2 _0 BUN _1 CREATININE 1.28 1.14 1.14  CALCIUM 9.3 8.7 8.6  MG  --  1.9  --   PHOS  --  3.0  --   GLUCOSE 200* 206* 102*    CBG (last 3)   Recent Labs  10/12/13 0805  GLUCAP 121*    Scheduled Meds: .  ciprofloxacin  400 mg Intravenous Q12H  . feeding supplement (RESOURCE BREEZE)  1 Container Oral TID WC  . heparin  5,000 Units Subcutaneous 3 times per day  . metronidazole  500 mg Intravenous Q8H  . peg 3350 powder  0.5 kit Oral Once  . sodium chloride  3 mL Intravenous Q12H  . zolpidem  5 mg Oral QHS    Continuous Infusions: . sodium chloride 75 mL/hr at 10/12/13 2694    Mikey College MS, RD, LDN [phone] Weekend/After Hours Pager

## 2013-10-12 NOTE — Progress Notes (Signed)
PARENTERAL NUTRITION CONSULT NOTE - INITIAL  Pharmacy Consult for TNA Indication: Severe malnutrition in setting of bowel obstruction due to colon cancer  Current Nutrition:  Clear liquids + Resource Breeze tidwc  Assessment: 89 YOM who presented with invasive colon cancer and intermittent diarrhea with weight loss. Imaging revealed a large mass that is obstructing the colon. The patient's pre-albumin was 5.5 on 5/27. A bowel movement is charted from this morning but per RN very small d/t obstruction. The patient cannot be advanced from clears and will likely be taken for surgical intervention soon. Pharmacy was consulted to start TNA for nutritional support.  Given the time of day and the fact that the PICC line has yet to be placed, will plan to start on 5/31  Nutritional Goals:  2200-2500 kCal, 110-115 grams of protein per day  Plan:  - Will order TNA labs - Will access TNA labs and plan to start TNA on 5/31  Alycia Rossetti, PharmD, [place] Pharmacist Pager: [phone] 10/12/2013 2:07 PM

## 2013-10-12 NOTE — Progress Notes (Addendum)
Patient ID: Grant [name], male   DOB: 09/07/1955, 57 y.o.   MRN: [id number] [facility] PROGRESS NOTE  Grant [name] MRN:[id number] DOB: 01/10/1956 DOA: 10/09/2013 PCP: No PCP Per Patient  Brief narrative: 57-year-old male with no significant past medical history who presented to [facility] ED 10/09/2013 with reports of intermittent diarrhea, significant weight loss of about 20 pounds in last month, unintentional. He did have intentional 150 pound weight loss over past few years. In ED, patient had CT abdomen and pelvis and chest and was found to have large mass in the rectosigmoid colon concerning for locally invasive colorectal cancer likely with contained perforation. There was also gas seen within the urinary bladder concerning for colovesical fistula. Small bilateral lower lobe pulmonary nodules were also seen. Pt underwent colonoscopy and right PCN placement with biopsy on 529/2015.  Assessment and plan:   Principal problem:  Locally advanced rectal cancer and possible contained perforation  Pt underwent colonoscopy on 10/11/2013. Per surgery he will most likely need diverting colostomy due to obstructing colon mass CEA was 3.7 WNL, CA 19-9 is 10.3 (low), CEA 125 is 7.2 (WNL), AFP is 1.7 (WNL), pre-albumin is low at 5.5.  Oncologic treatment options will be dependant on tissue pathology diagnosis.  Continue cipro and flagyl. Active Problems:  Large pelvic mass with rectal cancer and possible bladder and right ureteral obstruction  Per urology based on CT scan findings patient would be at high risk for a cystectomy and urinary diversion with primary surgical resection of the pelvic mass. He underwent PCN with biopsy on 10/11/2013 Colovesical fistula  Per urology recommendation is for treatment of UTI (he is on cipro). Patient will also need a repeat CT scan of pelvis and bladder with contrast in few of days to evaluate this fistula more definitively. Acute blood loss anemia  Likely  secondary to advanced colorectal cancer  Has received total of 4 units PRBC blood transfusion since admission; 2 units on admission and then 2 units the following day 10/09/2013 Hemoglobin 8.9 today Severe protein calorie malnutrition  Low albumin at 5.5, low albumin level at 2.1 and significant weight loss of 20 pounds in the past one month  Patient is able to eat but still malnourished and only on CLD. Will start TNA.  DVT prophylaxis: heparin sub Q   Code Status: full code  Family Communication: plan of care discussed with the patient  Disposition Plan: home when stable   Consultants:  Urology (Dr. Borden)  Oncology  Surgery Procedures:  Colonoscopy 10/11/2013 Right PCN with biopsy 10/11/2013 TPN 10/12/2013  Antibiotics:  Cipro 10/09/2013 -->  Flagyl 10/09/2013 -->     M , MD  [facility] Pager [phone]  If 7PM-7AM, please contact night-coverage [email or web address] Password [email or web address] 10/12/2013, 4:48 PM   LOS: 3 days   HPI/Subjective: No acute overnight events.  Objective: Filed Vitals:   10/12/13 0028 10/12/13 0045 10/12/13 0247 10/12/13 0646  BP: 123/72 122/71 118/83 125/75  Pulse: 74 74 67 72  Temp: 98 F (36.7 C) 97.9 F (36.6 C) 97.3 F (36.3 C) 98.1 F (36.7 C)  TempSrc: Oral Oral Oral   Resp: 18 18 18 18  Height:      Weight:      SpO2: 100% 100% 100% 99%    Intake/Output Summary (Last 24 hours) at 10/12/13 1648 Last data filed at 10/12/13 1423  Gross per 24 hour  Intake   3880 ml  Output   1600 ml  Net   2280 ml      Exam:   General:  Pt is alert, not in acute distress  Cardiovascular: Regular rate and rhythm, S1/S2, no murmurs  Respiratory: bilateral air entry, no crackles  Abdomen: Soft, non tender, non distended, bowel sounds present  Extremities: No edema, pulses DP and PT palpable bilaterally  Neuro: Grossly nonfocal  Data Reviewed: Basic Metabolic Panel:  Recent Labs Lab 10/09/13 1331 10/09/13 2138 10/10/13 0345   NA 130* 131* 132*  K 4.5 4.1 3.9  CL 88* 94* 94*  CO2 27 24 25  GLUCOSE 200* 206* 102*  BUN 20 18 16  CREATININE 1.28 1.14 1.14  CALCIUM 9.3 8.7 8.6  MG  --  1.9  --   PHOS  --  3.0  --    Liver Function Tests:  Recent Labs Lab 10/09/13 1331 10/09/13 2138 10/10/13 0345  AST 11 13 13  ALT 8 10 8  ALKPHOS 97 90 81  BILITOT 0.4 0.4 0.3  PROT 7.1 6.4 6.2  ALBUMIN 2.5* 2.2* 2.1*    Recent Labs Lab 10/09/13 1331  LIPASE 10*   No results found for this basename: AMMONIA,  in the last 168 hours CBC:  Recent Labs Lab 10/09/13 1331 10/10/13 0345 10/10/13 0502 10/11/13 0901 10/12/13 0415  WBC 12.8* 8.4  --  6.5 7.7  NEUTROABS 12.0*  --   --   --   --   HGB 7.3* 6.0* 7.6* 6.8* 8.9*  HCT 24.7* 19.7* 24.2* 21.9* 28.2*  MCV 62.7* 62.3*  --  66.2* 69.5*  PLT 479* 377  --  387 381   Cardiac Enzymes:  Recent Labs Lab 10/09/13 1331  TROPONINI <0.30   BNP: No components found with this basename: POCBNP,  CBG:  Recent Labs Lab 10/12/13 0805  GLUCAP 121*    Recent Results (from the past 240 hour(s))  URINE CULTURE     Status: None   Collection Time    10/09/13  5:22 PM      Result Value Ref Range Status   Specimen Description URINE, CLEAN CATCH   Final   Special Requests ADDED [id number]   Final   Culture  Setup Time     Final   Value: 10/09/2013 22:53     Performed at [facility]   Colony Count     Final   Value: >=100,000 COLONIES/ML     Performed at [facility]   Culture     Final   Value: ESCHERICHIA COLI     Performed at [facility]   Report Status PENDING   Incomplete     Studies: Ir Perc Nephrostomy Right  10/11/2013   INDICATION: History of obstructive rectal mass with associated mild right-sided ureterectasis and pelvicaliectasis, not suitable for retrograde ureteral stent placement per urologic consultation. Please perform ultrasound and fluoroscopic guided percutaneous nephrostomy catheter placement to preserve  right-sided renal function and avoid suspected impending obstructive uropathy.  EXAM: 1. ULTRASOUND GUIDANCE FOR PUNCTURE OF THE RIGHT RENAL COLLECTING SYSTEM 2. RIGHT PERCUTANEOUS NEPHROSTOMY TUBE PLACEMENT.  COMPARISON:  CT abdomen pelvis - 10/09/2013  MEDICATIONS: The patient is currently admitted to the hospital and receiving intravenous antibiotics. The antibiotic was administered in an appropriate time frame prior to skin puncture.  ANESTHESIA/SEDATION: Fentanyl 175 mcg IV; Versed 3.5 mg IV  Total Moderate Sedation Time  30 minutes.  CONTRAST:  20 mL Isovue 300 administered into the collecting system  FLUOROSCOPY TIME:  3 minutes 48 seconds.  COMPLICATIONS: None immediate  PROCEDURE: The procedure, risks,   benefits, and alternatives were explained to the patient. Questions regarding the procedure were encouraged and answered. The patient understands and consents to the procedure. A timeout was performed prior to the initiation of the procedure.  The right flank region was prepped with Betadine in a sterile fashion, and a sterile drape was applied covering the operative field. A sterile gown and sterile gloves were used for the procedure. Local anesthesia was provided with 1% Lidocaine with epinephrine. Ultrasound was used to localize the right kidney. Under direct ultrasound guidance, a 21 gauge needle was advanced into the renal collecting system. An ultrasound image documentation was performed. Access within the collecting system was confirmed with the efflux of urine followed by limited contrast injection.  Over a Nitrex wire, the tract was dilated with an Accustick stent. Over a guide wire, a 10-French percutaneous nephrostomy catheter was advanced into the collecting system where the coil was formed and locked. Contrast was injected and several sport radiographs were obtained in various obliquities confirming access. The catheter was secured at the skin with a Prolene retention suture and a gravity bag was  placed. A dressing was placed. The patient tolerated procedure well without immediate postprocedural complication.  FINDINGS: Ultrasound scanning demonstrates very mild right-sided pelvicaliectasis, similar to preprocedural abdominal CT.  Under direct ultrasound guidance, a posterior inferior calix was targeted allowing advancement of an 10-French percutaneous nephrostomy catheter under intermittent fluoroscopic guidance. Contrast injection confirmed appropriate positioning.  IMPRESSION: Successful ultrasound and fluoroscopic guided placement of a right sided 10 French PCN.   Electronically Signed   By: John  Watts M.D.   On: 10/11/2013 18:08   Ir Us Guide Bx Asp/drain  10/11/2013   INDICATION: History of obstructive rectal mass with associated mild right-sided ureterectasis and pelvicaliectasis, not suitable for retrograde ureteral stent placement per urologic consultation. Please perform ultrasound and fluoroscopic guided percutaneous nephrostomy catheter placement to preserve right-sided renal function and avoid suspected impending obstructive uropathy.  EXAM: 1. ULTRASOUND GUIDANCE FOR PUNCTURE OF THE RIGHT RENAL COLLECTING SYSTEM 2. RIGHT PERCUTANEOUS NEPHROSTOMY TUBE PLACEMENT.  COMPARISON:  CT abdomen pelvis - 10/09/2013  MEDICATIONS: The patient is currently admitted to the hospital and receiving intravenous antibiotics. The antibiotic was administered in an appropriate time frame prior to skin puncture.  ANESTHESIA/SEDATION: Fentanyl 175 mcg IV; Versed 3.5 mg IV  Total Moderate Sedation Time  30 minutes.  CONTRAST:  20 mL Isovue 300 administered into the collecting system  FLUOROSCOPY TIME:  3 minutes 48 seconds.  COMPLICATIONS: None immediate  PROCEDURE: The procedure, risks, benefits, and alternatives were explained to the patient. Questions regarding the procedure were encouraged and answered. The patient understands and consents to the procedure. A timeout was performed prior to the initiation of the  procedure.  The right flank region was prepped with Betadine in a sterile fashion, and a sterile drape was applied covering the operative field. A sterile gown and sterile gloves were used for the procedure. Local anesthesia was provided with 1% Lidocaine with epinephrine. Ultrasound was used to localize the right kidney. Under direct ultrasound guidance, a 21 gauge needle was advanced into the renal collecting system. An ultrasound image documentation was performed. Access within the collecting system was confirmed with the efflux of urine followed by limited contrast injection.  Over a Nitrex wire, the tract was dilated with an Accustick stent. Over a guide wire, a 10-French percutaneous nephrostomy catheter was advanced into the collecting system where the coil was formed and locked. Contrast was injected and several sport   radiographs were obtained in various obliquities confirming access. The catheter was secured at the skin with a Prolene retention suture and a gravity bag was placed. A dressing was placed. The patient tolerated procedure well without immediate postprocedural complication.  FINDINGS: Ultrasound scanning demonstrates very mild right-sided pelvicaliectasis, similar to preprocedural abdominal CT.  Under direct ultrasound guidance, a posterior inferior calix was targeted allowing advancement of an 10-French percutaneous nephrostomy catheter under intermittent fluoroscopic guidance. Contrast injection confirmed appropriate positioning.  IMPRESSION: Successful ultrasound and fluoroscopic guided placement of a right sided 10 French PCN.   Electronically Signed   By: John  Watts M.D.   On: 10/11/2013 18:08    Scheduled Meds: . ciprofloxacin  400 mg Intravenous Q12H  . feeding supplement (RESOURCE BREEZE)  1 Container Oral TID WC  . heparin  5,000 Units Subcutaneous 3 times per day  . metronidazole  500 mg Intravenous Q8H  . peg 3350 powder  0.5 kit Oral Once  . sodium chloride  3 mL  Intravenous Q12H  . zolpidem  5 mg Oral QHS   Continuous Infusions: . sodium chloride 75 mL/hr at 10/12/13 0922         

## 2013-10-12 NOTE — Progress Notes (Signed)
1 Day Post-Op  Subjective: Rt PCN placed 5/29 Feeling some better today  Objective: Vital signs in last 24 hours: Temp:  [97.3 F (36.3 C)-98.9 F (37.2 C)] 98.1 F (36.7 C) (05/30 0646) Pulse Rate:  [67-96] 72 (05/30 0646) Resp:  [10-19] 18 (05/30 0646) BP: (111-142)/(60-83) 125/75 mmHg (05/30 0646) SpO2:  [94 %-100 %] 99 % (05/30 0646) Last BM Date: 10/12/13  Intake/Output from previous day: 05/29 0701 - 05/30 0700 In: 3673 [P.O.:240; I.V.:2453; Blood:980] Out: 1200 [Urine:1200] Intake/Output this shift: Total I/O In: 600 [P.O.:600] Out: -   PE:  Afeb; VSS Rt PCN output 1.2 liters yesterday 750 cc in bag now: yellow urine Site clean and dry; sl tender   Lab Results:   Recent Labs  10/11/13 0901 10/12/13 0415  WBC 6.5 7.7  HGB 6.8* 8.9*  HCT 21.9* 28.2*  PLT 387 381   BMET  Recent Labs  10/09/13 2138 10/10/13 0345  NA 131* 132*  K 4.1 3.9  CL 94* 94*  CO2 24 25  GLUCOSE 206* 102*  BUN 18 16  CREATININE 1.14 1.14  CALCIUM 8.7 8.6   PT/INR  Recent Labs  10/09/13 1331 10/09/13 2138  LABPROT 14.8 16.3*  INR 1.19 1.34   ABG No results found for this basename: PHART, PCO2, PO2, HCO3,  in the last 72 hours  Studies/Results: Ir Perc Nephrostomy Right  10/11/2013   INDICATION: History of obstructive rectal mass with associated mild right-sided ureterectasis and pelvicaliectasis, not suitable for retrograde ureteral stent placement per urologic consultation. Please perform ultrasound and fluoroscopic guided percutaneous nephrostomy catheter placement to preserve right-sided renal function and avoid suspected impending obstructive uropathy.  EXAM: 1. ULTRASOUND GUIDANCE FOR PUNCTURE OF THE RIGHT RENAL COLLECTING SYSTEM 2. RIGHT PERCUTANEOUS NEPHROSTOMY TUBE PLACEMENT.  COMPARISON:  CT abdomen pelvis - 10/09/2013  MEDICATIONS: The patient is currently admitted to the hospital and receiving intravenous antibiotics. The antibiotic was administered in an  appropriate time frame prior to skin puncture.  ANESTHESIA/SEDATION: Fentanyl 175 mcg IV; Versed 3.5 mg IV  Total Moderate Sedation Time  30 minutes.  CONTRAST:  20 mL Isovue 300 administered into the collecting system  FLUOROSCOPY TIME:  3 minutes 48 seconds.  COMPLICATIONS: None immediate  PROCEDURE: The procedure, risks, benefits, and alternatives were explained to the patient. Questions regarding the procedure were encouraged and answered. The patient understands and consents to the procedure. A timeout was performed prior to the initiation of the procedure.  The right flank region was prepped with Betadine in a sterile fashion, and a sterile drape was applied covering the operative field. A sterile gown and sterile gloves were used for the procedure. Local anesthesia was provided with 1% Lidocaine with epinephrine. Ultrasound was used to localize the right kidney. Under direct ultrasound guidance, a 21 gauge needle was advanced into the renal collecting system. An ultrasound image documentation was performed. Access within the collecting system was confirmed with the efflux of urine followed by limited contrast injection.  Over a Nitrex wire, the tract was dilated with an Accustick stent. Over a guide wire, a 10-French percutaneous nephrostomy catheter was advanced into the collecting system where the coil was formed and locked. Contrast was injected and several sport radiographs were obtained in various obliquities confirming access. The catheter was secured at the skin with a Prolene retention suture and a gravity bag was placed. A dressing was placed. The patient tolerated procedure well without immediate postprocedural complication.  FINDINGS: Ultrasound scanning demonstrates very mild right-sided pelvicaliectasis,  similar to preprocedural abdominal CT.  Under direct ultrasound guidance, a posterior inferior calix was targeted allowing advancement of an 10-French percutaneous nephrostomy catheter under  intermittent fluoroscopic guidance. Contrast injection confirmed appropriate positioning.  IMPRESSION: Successful ultrasound and fluoroscopic guided placement of a right sided 10 French PCN.   Electronically Signed   By: Sandi Mariscal M.D.   On: 10/11/2013 18:08   Ir US Guide Bx Asp/drain  10/11/2013   INDICATION: History of obstructive rectal mass with associated mild right-sided ureterectasis and pelvicaliectasis, not suitable for retrograde ureteral stent placement per urologic consultation. Please perform ultrasound and fluoroscopic guided percutaneous nephrostomy catheter placement to preserve right-sided renal function and avoid suspected impending obstructive uropathy.  EXAM: 1. ULTRASOUND GUIDANCE FOR PUNCTURE OF THE RIGHT RENAL COLLECTING SYSTEM 2. RIGHT PERCUTANEOUS NEPHROSTOMY TUBE PLACEMENT.  COMPARISON:  CT abdomen pelvis - 10/09/2013  MEDICATIONS: The patient is currently admitted to the hospital and receiving intravenous antibiotics. The antibiotic was administered in an appropriate time frame prior to skin puncture.  ANESTHESIA/SEDATION: Fentanyl 175 mcg IV; Versed 3.5 mg IV  Total Moderate Sedation Time  30 minutes.  CONTRAST:  20 mL Isovue 300 administered into the collecting system  FLUOROSCOPY TIME:  3 minutes 48 seconds.  COMPLICATIONS: None immediate  PROCEDURE: The procedure, risks, benefits, and alternatives were explained to the patient. Questions regarding the procedure were encouraged and answered. The patient understands and consents to the procedure. A timeout was performed prior to the initiation of the procedure.  The right flank region was prepped with Betadine in a sterile fashion, and a sterile drape was applied covering the operative field. A sterile gown and sterile gloves were used for the procedure. Local anesthesia was provided with 1% Lidocaine with epinephrine. Ultrasound was used to localize the right kidney. Under direct ultrasound guidance, a 21 gauge needle was advanced  into the renal collecting system. An ultrasound image documentation was performed. Access within the collecting system was confirmed with the efflux of urine followed by limited contrast injection.  Over a Nitrex wire, the tract was dilated with an Accustick stent. Over a guide wire, a 10-French percutaneous nephrostomy catheter was advanced into the collecting system where the coil was formed and locked. Contrast was injected and several sport radiographs were obtained in various obliquities confirming access. The catheter was secured at the skin with a Prolene retention suture and a gravity bag was placed. A dressing was placed. The patient tolerated procedure well without immediate postprocedural complication.  FINDINGS: Ultrasound scanning demonstrates very mild right-sided pelvicaliectasis, similar to preprocedural abdominal CT.  Under direct ultrasound guidance, a posterior inferior calix was targeted allowing advancement of an 10-French percutaneous nephrostomy catheter under intermittent fluoroscopic guidance. Contrast injection confirmed appropriate positioning.  IMPRESSION: Successful ultrasound and fluoroscopic guided placement of a right sided 10 French PCN.   Electronically Signed   By: Sandi Mariscal M.D.   On: 10/11/2013 18:08    Anti-infectives: Anti-infectives   Start     Dose/Rate Route Frequency Ordered Stop   10/09/13 2130  ciprofloxacin (CIPRO) IVPB 400 mg     400 mg 200 mL/hr over 60 Minutes Intravenous Every 12 hours 10/09/13 2108     10/09/13 2130  metroNIDAZOLE (FLAGYL) IVPB 500 mg     500 mg 100 mL/hr over 60 Minutes Intravenous Every 8 hours 10/09/13 2109     10/09/13 2045  ciprofloxacin (CIPRO) IVPB 400 mg  Status:  Discontinued     400 mg 200 mL/hr over 60 Minutes Intravenous  Every 12 hours 10/09/13 2041 10/09/13 2103   10/09/13 2045  metroNIDAZOLE (FLAGYL) IVPB 500 mg  Status:  Discontinued     500 mg 100 mL/hr over 60 Minutes Intravenous Every 8 hours 10/09/13 2042  10/09/13 2109      Assessment/Plan: s/p Procedure(s): COLONOSCOPY (N/A)  Rt PCN placed 5/29 hydronephrosis Colorectal mass Plan per surgery   LOS: 3 days    Lavonia Drafts 10/12/2013

## 2013-10-13 LAB — COMPREHENSIVE METABOLIC PANEL
ALT: 9 U/L (ref 0–53)
AST: 11 U/L (ref 0–37)
Albumin: 2.1 g/dL — ABNORMAL LOW (ref 3.5–5.2)
Alkaline Phosphatase: 58 U/L (ref 39–117)
BUN: 6 mg/dL (ref 6–23)
CALCIUM: 8.2 mg/dL — AB (ref 8.4–10.5)
CO2: 23 mEq/L (ref 19–32)
CREATININE: 0.98 mg/dL (ref 0.50–1.35)
Chloride: 101 mEq/L (ref 96–112)
GFR calc Af Amer: >90 mL/min (ref >90)
GFR calc non Af Amer: >90 mL/min (ref >90)
Glucose, Bld: 103 mg/dL — ABNORMAL HIGH (ref 70–99)
Potassium: 3.5 mEq/L — ABNORMAL LOW (ref 3.7–5.3)
Sodium: 136 mEq/L — ABNORMAL LOW (ref 137–147)
TOTAL PROTEIN: 5.7 g/dL — AB (ref 6.0–8.3)
Total Bilirubin: 0.4 mg/dL (ref 0.3–1.2)

## 2013-10-13 LAB — CBC
HEMATOCRIT: 28.4 % — AB (ref 39.0–52.0)
Hemoglobin: 8.8 g/dL — ABNORMAL LOW (ref 13.0–17.0)
MCH: 21.6 pg — ABNORMAL LOW (ref 26.0–34.0)
MCHC: 31 g/dL (ref 30.0–36.0)
MCV: 69.8 fL — AB (ref 78.0–100.0)
Platelets: 369 10*3/uL (ref 150–400)
RBC: 4.07 MIL/uL — ABNORMAL LOW (ref 4.22–5.81)
RDW: 22.1 % — ABNORMAL HIGH (ref 11.5–15.5)
WBC: 7.4 10*3/uL (ref 4.0–10.5)

## 2013-10-13 LAB — TYPE AND SCREEN
ABO/RH(D): A POS
Antibody Screen: NEGATIVE
UNIT DIVISION: 0
UNIT DIVISION: 0
Unit division: 0
Unit division: 0

## 2013-10-13 LAB — DIFFERENTIAL
BASOS ABS: 0 10*3/uL (ref 0.0–0.1)
Basophils Relative: 0 % (ref 0–1)
EOS PCT: 1 % (ref 0–5)
Eosinophils Absolute: 0.1 10*3/uL (ref 0.0–0.7)
LYMPHS ABS: 0.7 10*3/uL (ref 0.7–4.0)
LYMPHS PCT: 10 % — AB (ref 12–46)
MONOS PCT: 6 % (ref 3–12)
Monocytes Absolute: 0.4 10*3/uL (ref 0.1–1.0)
NEUTROS PCT: 83 % — AB (ref 43–77)
Neutro Abs: 6.2 10*3/uL (ref 1.7–7.7)

## 2013-10-13 LAB — GLUCOSE, CAPILLARY
Glucose-Capillary: 105 mg/dL — ABNORMAL HIGH (ref 70–99)
Glucose-Capillary: 111 mg/dL — ABNORMAL HIGH (ref 70–99)

## 2013-10-13 LAB — PHOSPHORUS: PHOSPHORUS: 2.8 mg/dL (ref 2.3–4.6)

## 2013-10-13 LAB — MAGNESIUM: MAGNESIUM: 2.1 mg/dL (ref 1.5–2.5)

## 2013-10-13 LAB — TRIGLYCERIDES: TRIGLYCERIDES: 96 mg/dL (ref <150)

## 2013-10-13 MED ORDER — SODIUM CHLORIDE 0.9 % IJ SOLN
10.0000 mL | INTRAMUSCULAR | Status: DC | PRN
  Administered 2013-10-16 – 2013-10-17 (×3): 10 mL

## 2013-10-13 MED ORDER — SODIUM CHLORIDE 0.9 % IV SOLN: INTRAVENOUS | Status: DC

## 2013-10-13 MED ORDER — POTASSIUM CHLORIDE 10 MEQ/50ML IV SOLN
10.0000 meq | INTRAVENOUS | Status: AC
  Administered 2013-10-13 (×3): 10 meq via INTRAVENOUS
  Filled 2013-10-13 (×3): qty 50

## 2013-10-13 MED ORDER — ALPRAZOLAM 0.5 MG PO TABS
0.5000 mg | ORAL_TABLET | Freq: Three times a day (TID) | ORAL | Status: DC | PRN
  Administered 2013-10-13 – 2013-10-20 (×19): 0.5 mg via ORAL
  Filled 2013-10-13 (×20): qty 1

## 2013-10-13 MED ORDER — SODIUM CHLORIDE 0.9 % IJ SOLN
10.0000 mL | Freq: Two times a day (BID) | INTRAMUSCULAR | Status: DC
  Administered 2013-10-14 – 2013-10-20 (×2): 10 mL

## 2013-10-13 MED ORDER — INSULIN ASPART 100 UNIT/ML ~~LOC~~ SOLN
0.0000 [IU] | Freq: Four times a day (QID) | SUBCUTANEOUS | Status: DC
  Administered 2013-10-14: 1 [IU] via SUBCUTANEOUS
  Administered 2013-10-14: 2 [IU] via SUBCUTANEOUS
  Administered 2013-10-15 (×2): 1 [IU] via SUBCUTANEOUS
  Administered 2013-10-15: 2 [IU] via SUBCUTANEOUS
  Administered 2013-10-16: 1 [IU] via SUBCUTANEOUS

## 2013-10-13 MED ORDER — TRACE MINERALS CR-CU-F-FE-I-MN-MO-SE-ZN IV SOLN
INTRAVENOUS | Status: AC
  Administered 2013-10-13: 18:00:00 via INTRAVENOUS
  Filled 2013-10-13: qty 1000

## 2013-10-13 MED ORDER — FAT EMULSION 20 % IV EMUL
250.0000 mL | INTRAVENOUS | Status: AC
  Administered 2013-10-13: 250 mL via INTRAVENOUS
  Filled 2013-10-13: qty 250

## 2013-10-13 NOTE — Progress Notes (Signed)
Patient ID: Grant [name], male   DOB: 1956-03-31, 58 y.o.   MRN: [id number] [facility] PROGRESS NOTE  [name] VOJ:[id number] DOB: [birth year] DOA: 10/09/2013 PCP: No PCP Per Patient  Brief narrative: 58 year old male with no significant past medical history who presented to [facility] ED 10/09/2013 with reports of intermittent diarrhea, significant weight loss of about 20 pounds in last month, unintentional. He did have intentional 150 pound weight loss over past few years. In ED, patient had CT abdomen and pelvis and chest and was found to have large mass in the rectosigmoid colon concerning for locally invasive colorectal cancer likely with contained perforation. There was also gas seen within the urinary bladder concerning for colovesical fistula. Small bilateral lower lobe pulmonary nodules were also seen. Pt underwent colonoscopy and right PCN placement with biopsy on 529/2015.   Assessment and plan:   Principal problem:  Locally advanced rectal cancer and possible contained perforation  Pt underwent colonoscopy on 10/11/2013. Per surgery patient will most likely need diverting colostomy due to obstructing colon mass and plan for this is this coming up week CEA was 3.7 WNL, CA 19-9 is 10.3 (low), CEA 125 is 7.2 (WNL), AFP is 1.7 (WNL), pre-albumin is low at 5.5.  Dr. Annamaria Boots of oncology has seen and evaluated the pt.Oncologic treatment options will be dependant on tissue pathology diagnosis.  Continue cipro and flagyl. Active Problems:  Large pelvic mass with rectal cancer and possible bladder and right ureteral obstruction  Patient was seen and evaluated by urology. Per urology and after evaluating the CT scan findings they determined that the patient would be at high risk for a cystectomy and urinary diversion with primary surgical resection of the pelvic mass. They recommended right PCN and pt underwent this procedure on 10/11/2013 with no subsequent complications.  Colovesical  fistula  Per urology recommendation, continue treatment for UTI  And repeat CT scan likely in next day or so, will check with GU. Pt is on cuipro for UTI. Acute blood loss anemia  Likely secondary to advanced colorectal cancer  Has received total of 4 units PRBC blood transfusion since admission; 2 units on admission and then 2 units 10/09/2013  Hemoglobin 8.8 today Severe protein calorie malnutrition  Low albumin at 5.5, low albumin level at 2.1 and significant weight loss of 20 pounds in the past one month  We will initiate TNA today   DVT prophylaxis: heparin sub Q   Code Status: full code  Family Communication: plan of care discussed with the patient at the bedside Disposition Plan: home when stable   Consultants:  Urology (Dr. Alinda Money)  Oncology  Surgery Procedures:  Colonoscopy 10/11/2013  Right PCN with biopsy 10/11/2013  TPN 10/12/2013  Antibiotics:  Cipro 10/09/2013 -->  Flagyl 10/09/2013 -->    [name], MD  [facility] Pager [phone]  If 7PM-7AM, please contact night-coverage [email or web address] Password [email or web address] 10/13/2013, 8:25 AM   LOS: 4 days    HPI/Subjective: No acute overnight events.  Objective: Filed Vitals:   10/12/13 0247 10/12/13 0646 10/12/13 2120 10/13/13 0600  BP: 118/83 125/75 131/73 121/76  Pulse: 67 72 86 77  Temp: 97.3 F (36.3 C) 98.1 F (36.7 C) 98.2 F (36.8 C) 98 F (36.7 C)  TempSrc: Oral  Oral Oral  Resp: _0 Height:      Weight:    73.936 kg (163 lb)  SpO2: 100% 99% 100% 100%    Intake/Output Summary (Last 24 hours) at 10/13/13 0825 Last  data filed at 10/13/13 0708  Gross per 24 hour  Intake 3225.25 ml  Output   2400 ml  Net 825.25 ml    Exam:   General:  Pt is not in distress  Cardiovascular: Regular rate and rhythm, S1/S2, no murmurs  Respiratory: No wheezing or rhonchi  Abdomen: Soft, non tender, non distended, bowel sounds present  Extremities: No edema, pulses DP and PT palpable  bilaterally  Neuro: Grossly nonfocal  Data Reviewed: Basic Metabolic Panel:  Recent Labs Lab 10/09/13 1331 10/09/13 2138 10/10/13 0345 10/13/13 0600  NA 130* 131* 132* 136*  K 4.5 4.1 3.9 3.5*  CL 88* 94* 94* 101  CO2 _0 GLUCOSE 200* 206* 102* 103*  BUN _1 CREATININE 1.28 1.14 1.14 0.98  CALCIUM 9.3 8.7 8.6 8.2*  MG  --  1.9  --  2.1  PHOS  --  3.0  --  2.8   Liver Function Tests:  Recent Labs Lab 10/09/13 1331 10/09/13 2138 10/10/13 0345 10/13/13 0600  AST _2 ALT _3 ALKPHOS 97 90 81 58  BILITOT 0.4 0.4 0.3 0.4  PROT 7.1 6.4 6.2 5.7*  ALBUMIN 2.5* 2.2* 2.1* 2.1*    Recent Labs Lab 10/09/13 1331  LIPASE 10*   No results found for this basename: AMMONIA,  in the last 168 hours CBC:  Recent Labs Lab 10/09/13 1331 10/10/13 0345 10/10/13 0502 10/11/13 0901 10/12/13 0415 10/13/13 0600  WBC 12.8* 8.4  --  6.5 7.7 7.4  NEUTROABS 12.0*  --   --   --   --  6.2  HGB 7.3* 6.0* 7.6* 6.8* 8.9* 8.8*  HCT 24.7* 19.7* 24.2* 21.9* 28.2* 28.4*  MCV 62.7* 62.3*  --  66.2* 69.5* 69.8*  PLT 479* 377  --  387 381 369   Cardiac Enzymes:  Recent Labs Lab 10/09/13 1331  TROPONINI <0.30   BNP: No components found with this basename: POCBNP,  CBG:  Recent Labs Lab 10/12/13 0805  GLUCAP 121*    URINE CULTURE     Status: None   Collection Time    10/09/13  5:22 PM      Result Value Ref Range Status   Specimen Description URINE, CLEAN CATCH   Final   Value: ESCHERICHIA COLI     Performed at [facility]   Report Status 10/12/2013 FINAL   Final   Organism ID, Bacteria ESCHERICHIA COLI   Final     Studies: Ir Perc Nephrostomy Right 10/11/2013   IMPRESSION: Successful ultrasound and fluoroscopic guided placement of a right sided 10 French PCN.   Electronically Signed   By: Sandi Mariscal M.D.   On: 10/11/2013 18:08   Ir US Guide Bx Asp/drain 10/11/2013  IMPRESSION: Successful ultrasound and fluoroscopic guided  placement of a right sided 10 French PCN.   Electronically Signed   By: Sandi Mariscal M.D.   On: 10/11/2013 18:08    Scheduled Meds: . ciprofloxacin  400 mg Intravenous Q12H  . feeding supplement (RESOURCE BREEZE)  1 Container Oral TID WC  . heparin  5,000 Units Subcutaneous 3 times per day  . metronidazole  500 mg Intravenous Q8H  . peg 3350 powder  0.5 kit Oral Once  . sodium chloride  3 mL Intravenous Q12H  . zolpidem  5 mg Oral QHS   Continuous Infusions: . sodium chloride 75 mL/hr at 10/12/13 1962

## 2013-10-13 NOTE — Progress Notes (Signed)
PARENTERAL NUTRITION CONSULT NOTE - FOLLOW UP  Pharmacy Consult for TNA Indication: Severe malnutrition in setting of bowel obstruction due to colon cancer  Allergies  Allergen Reactions  . Strawberry Anaphylaxis and Hives  . Sunflower Oil Anaphylaxis and Hives    seeds  . Watermelon [Citrullus Vulgaris] Anaphylaxis and Hives    Patient Measurements: Height: _0  (198.1 cm) Weight: 163 lb (73.936 kg) IBW/kg (Calculated) : 91.4  Vital Signs: Temp: 98 F (36.7 C) (05/31 0600) Temp src: Oral (05/31 0600) BP: 121/76 mmHg (05/31 0600) Pulse Rate: 77 (05/31 0600) Intake/Output from previous day: 05/30 0701 - 05/31 0700 In: 1969 [P.O.:1340; I.V.:629] Out: 2200 [Urine:2200] Intake/Output from this shift: Total I/O In: 1256.3 [I.V.:1256.3] Out: 200 [Urine:200]  Labs:  Recent Labs  10/11/13 0901 10/12/13 0415 10/13/13 0600  WBC 6.5 7.7 7.4  HGB 6.8* 8.9* 8.8*  HCT 21.9* 28.2* 28.4*  PLT 387 381 369     Recent Labs  10/13/13 0600  NA 136*  K 3.5*  CL 101  CO2 23  GLUCOSE 103*  BUN 6  CREATININE 0.98  CALCIUM 8.2*  MG 2.1  PHOS 2.8  PROT 5.7*  ALBUMIN 2.1*  AST 11  ALT 9  ALKPHOS 58  BILITOT 0.4  TRIG 96   Estimated Creatinine Clearance: 88 ml/min (by C-G formula based on Cr of 0.98).    Recent Labs  10/12/13 0805  GLUCAP 121*     Insulin Requirements in the past 24 hours:  None  Assessment: 54 YOM who presented with intermittent diarrhea with 25 lb unintentional weight loss. Imaging revealed a large mass that is obstructing the colon >> invasive colon cancer. The patient's pre-albumin was 5.5 on 5/27. A bowel movement is charted from this morning but per RN very small d/t obstruction. The patient cannot be advanced from clears and will likely be taken for surgical intervention soon. Pharmacy was consulted to start TNA for nutritional support.  GI: Large mass in the rectosigmoid colon likely invasive colorectal cancer. S/p colonoscopy with  biopsy on 5/29 - awaiting final pathology results. Per colonoscopy note there was a "lumen occluding large friable malignant-appearing mass in the rectum that the scope could not be passed beyond". Pt likely to need diverting colostomy. Diet unable to be advanced beyond clears and pharmacy consulted to start TNA for nutritional support. Pre-albumin 5.5 (5/27), alb 2.1 (5/31) Endo: No hx. CBG 103 on BMET Lytes: Lytes wnl except Na 136 (low), K 3.5 (low). Mg 2.1, Phos 2.8 - will monitor daily while initiating TNA to monitor for refeeding syndrome Renal: SCr 0.98, CrCl~80-90 ml/min Pulm: 100% RA Cards: No hx. BP/HR wnl. On prn hydralazine Hepatobil: LFTs/Tbili/Alk Phos wnl. TG 96 (5/31) Neuro: No sedation, c/o R-hip pain - on prn morphine ID: Cipro + Flagyl D#5 (5/27 >> ) for possible perforation in the setting of a colon mass. Afebrile, WBC wnl Best Practices: Hep SQ TPN Access: R-double lumen PICC placed 5/31 TPN day#: 0 (5/31 >>  Current Nutrition:  Clear liquids + Resource Breeze tidwc  Nutritional Goals:  2200-2500 kCal, 110-115 grams of protein per day **Goal rate of Clinimix E 5/20 at 95 ml/hr will provide 2486 kcal and 114 grams protein per day (meeting 100% of needs)  Plan:  - Initiate Clinimix E 5/20 at 40 ml/hr - Initiate IVFE at 7 ml/hr - KCl 10 mEq IV x 3 doses - Will plan to monitor Mg, K, Phos daily for now d/t risk of refeeding syndrome - Add MV  and TE daily - Start sensitive SSI q6h - Reduce IVF to 35 ml/hr when TPN initiated this evening - Will f/u TPN labs  Alycia Rossetti, PharmD, BCPS Clinical Pharmacist Pager: [phone] 10/13/2013 9:08 AM

## 2013-10-13 NOTE — Progress Notes (Signed)
Peripherally Inserted Central Catheter/Midline Placement  The IV Nurse has discussed with the patient and/or persons authorized to consent for the patient, the purpose of this procedure and the potential benefits and risks involved with this procedure.  The benefits include less needle sticks, lab draws from the catheter and patient may be discharged home with the catheter.  Risks include, but not limited to, infection, bleeding, blood clot (thrombus formation), and puncture of an artery; nerve damage and irregular heat beat.  Alternatives to this procedure were also discussed.  PICC/Midline Placement Documentation  PICC / Midline Double Lumen 10/13/13 PICC Right Cephalic 48 cm 2 cm (Active)  Indication for Insertion or Continuance of Line Administration of hyperosmolar/irritating solutions (i.e. TPN, Vancomycin, etc.) 10/13/2013  8:31 AM  Exposed Catheter (cm) 2 cm 10/13/2013  8:31 AM  Lumen #1 Status Flushed;Saline locked;Blood return noted 10/13/2013  8:31 AM  Lumen #2 Status Flushed;Saline locked;Blood return noted 10/13/2013  8:31 AM  Dressing Change Due 10/20/13 10/13/2013  8:31 AM       Grant [name] 10/13/2013, 8:33 AM

## 2013-10-13 NOTE — Progress Notes (Signed)
Patient ID: Grant [name], male   DOB: Sep 19, 1955, 58 y.o.   MRN: [id number] [facility] Progress Note:   2 Days Post-Op  Subjective: Mental status is clear and alert Objective: Vital signs in last 24 hours: Temp:  [98 F (36.7 C)-98.2 F (36.8 C)] 98 F (36.7 C) (05/31 0600) Pulse Rate:  [77-86] 77 (05/31 0600) Resp:  [15-17] 15 (05/31 0600) BP: (121-131)/(73-76) 121/76 mmHg (05/31 0600) SpO2:  [100 %] 100 % (05/31 0600) Weight:  [163 lb (73.936 kg)] 163 lb (73.936 kg) (05/31 0600)  Intake/Output from previous day: 05/30 0701 - 05/31 0700 In: 1969 [P.O.:1340; I.V.:629] Out: 2200 [Urine:2200] Intake/Output this shift: Total I/O In: 1256.3 [I.V.:1256.3] Out: 200 [Urine:200]  Physical Exam: Work of breathing is normal.  PIC line insertion team preparing him  Lab Results:  Results for orders placed during the hospital encounter of 10/09/13 (from the past 48 hour(s))  CBC     Status: Abnormal   Collection Time    10/11/13  9:01 AM      Result Value Ref Range   WBC 6.5  4.0 - 10.5 K/uL   RBC 3.31 (*) 4.22 - 5.81 MIL/uL   Hemoglobin 6.8 (*) 13.0 - 17.0 g/dL   Comment: REPEATED TO VERIFY     CRITICAL RESULT CALLED TO, READ BACK BY AND VERIFIED WITH:     [name],RN AT 1003 10/11/13 BY ZBEECH.   HCT 21.9 (*) 39.0 - 52.0 %   MCV 66.2 (*) 78.0 - 100.0 fL   MCH 20.5 (*) 26.0 - 34.0 pg   MCHC 31.1  30.0 - 36.0 g/dL   RDW 20.1 (*) 11.5 - 15.5 %   Platelets 387  150 - 400 K/uL  PREPARE RBC (CROSSMATCH)     Status: None   Collection Time    10/11/13 11:17 AM      Result Value Ref Range   Order Confirmation ORDER PROCESSED BY BLOOD BANK    CBC     Status: Abnormal   Collection Time    10/12/13  4:15 AM      Result Value Ref Range   WBC 7.7  4.0 - 10.5 K/uL   RBC 4.06 (*) 4.22 - 5.81 MIL/uL   Hemoglobin 8.9 (*) 13.0 - 17.0 g/dL   Comment: POST TRANSFUSION SPECIMEN   HCT 28.2 (*) 39.0 - 52.0 %   MCV 69.5 (*) 78.0 - 100.0 fL   MCH 21.9 (*) 26.0 - 34.0 pg    MCHC 31.6  30.0 - 36.0 g/dL   RDW 21.9 (*) 11.5 - 15.5 %   Platelets 381  150 - 400 K/uL  GLUCOSE, CAPILLARY     Status: Abnormal   Collection Time    10/12/13  8:05 AM      Result Value Ref Range   Glucose-Capillary 121 (*) 70 - 99 mg/dL  CBC     Status: Abnormal   Collection Time    10/13/13  6:00 AM      Result Value Ref Range   WBC 7.4  4.0 - 10.5 K/uL   RBC 4.07 (*) 4.22 - 5.81 MIL/uL   Hemoglobin 8.8 (*) 13.0 - 17.0 g/dL   HCT 28.4 (*) 39.0 - 52.0 %   MCV 69.8 (*) 78.0 - 100.0 fL   MCH 21.6 (*) 26.0 - 34.0 pg   MCHC 31.0  30.0 - 36.0 g/dL   RDW 22.1 (*) 11.5 - 15.5 %   Platelets 369  150 - 400 K/uL  COMPREHENSIVE METABOLIC PANEL     Status: Abnormal   Collection Time    10/13/13  6:00 AM      Result Value Ref Range   Sodium 136 (*) 137 - 147 mEq/L   Potassium 3.5 (*) 3.7 - 5.3 mEq/L   Chloride 101  96 - 112 mEq/L   CO2 23  19 - 32 mEq/L   Glucose, Bld 103 (*) 70 - 99 mg/dL   BUN 6  6 - 23 mg/dL   Creatinine, Ser 0.98  0.50 - 1.35 mg/dL   Calcium 8.2 (*) 8.4 - 10.5 mg/dL   Total Protein 5.7 (*) 6.0 - 8.3 g/dL   Albumin 2.1 (*) 3.5 - 5.2 g/dL   AST 11  0 - 37 U/L   ALT 9  0 - 53 U/L   Alkaline Phosphatase 58  39 - 117 U/L   Total Bilirubin 0.4  0.3 - 1.2 mg/dL   GFR calc non Af Amer >90  >90 mL/min   GFR calc Af Amer >90  >90 mL/min   Comment: (NOTE)     The eGFR has been calculated using the CKD EPI equation.     This calculation has not been validated in all clinical situations.     eGFR's persistently <90 mL/min signify possible Chronic Kidney     Disease.  MAGNESIUM     Status: None   Collection Time    10/13/13  6:00 AM      Result Value Ref Range   Magnesium 2.1  1.5 - 2.5 mg/dL  PHOSPHORUS     Status: None   Collection Time    10/13/13  6:00 AM      Result Value Ref Range   Phosphorus 2.8  2.3 - 4.6 mg/dL  DIFFERENTIAL     Status: Abnormal   Collection Time    10/13/13  6:00 AM      Result Value Ref Range   Neutrophils Relative % 83 (*) 43 - 77 %    Lymphocytes Relative 10 (*) 12 - 46 %   Monocytes Relative 6  3 - 12 %   Eosinophils Relative 1  0 - 5 %   Basophils Relative 0  0 - 1 %   Neutro Abs 6.2  1.7 - 7.7 K/uL   Lymphs Abs 0.7  0.7 - 4.0 K/uL   Monocytes Absolute 0.4  0.1 - 1.0 K/uL   Eosinophils Absolute 0.1  0.0 - 0.7 K/uL   Basophils Absolute 0.0  0.0 - 0.1 K/uL   RBC Morphology ELLIPTOCYTES    TRIGLYCERIDES     Status: None   Collection Time    10/13/13  6:00 AM      Result Value Ref Range   Triglycerides 96  <150 mg/dL    Radiology/Results: Ir Perc Nephrostomy Right  10/11/2013   INDICATION: History of obstructive rectal mass with associated mild right-sided ureterectasis and pelvicaliectasis, not suitable for retrograde ureteral stent placement per urologic consultation. Please perform ultrasound and fluoroscopic guided percutaneous nephrostomy catheter placement to preserve right-sided renal function and avoid suspected impending obstructive uropathy.  EXAM: 1. ULTRASOUND GUIDANCE FOR PUNCTURE OF THE RIGHT RENAL COLLECTING SYSTEM 2. RIGHT PERCUTANEOUS NEPHROSTOMY TUBE PLACEMENT.  COMPARISON:  CT abdomen pelvis - 10/09/2013  MEDICATIONS: The patient is currently admitted to the hospital and receiving intravenous antibiotics. The antibiotic was administered in an appropriate time frame prior to skin puncture.  ANESTHESIA/SEDATION: Fentanyl 175 mcg IV; Versed 3.5 mg IV  Total Moderate Sedation  Time  30 minutes.  CONTRAST:  20 mL Isovue 300 administered into the collecting system  FLUOROSCOPY TIME:  3 minutes 48 seconds.  COMPLICATIONS: None immediate  PROCEDURE: The procedure, risks, benefits, and alternatives were explained to the patient. Questions regarding the procedure were encouraged and answered. The patient understands and consents to the procedure. A timeout was performed prior to the initiation of the procedure.  The right flank region was prepped with Betadine in a sterile fashion, and a sterile drape was applied  covering the operative field. A sterile gown and sterile gloves were used for the procedure. Local anesthesia was provided with 1% Lidocaine with epinephrine. Ultrasound was used to localize the right kidney. Under direct ultrasound guidance, a 21 gauge needle was advanced into the renal collecting system. An ultrasound image documentation was performed. Access within the collecting system was confirmed with the efflux of urine followed by limited contrast injection.  Over a Nitrex wire, the tract was dilated with an Accustick stent. Over a guide wire, a 10-French percutaneous nephrostomy catheter was advanced into the collecting system where the coil was formed and locked. Contrast was injected and several sport radiographs were obtained in various obliquities confirming access. The catheter was secured at the skin with a Prolene retention suture and a gravity bag was placed. A dressing was placed. The patient tolerated procedure well without immediate postprocedural complication.  FINDINGS: Ultrasound scanning demonstrates very mild right-sided pelvicaliectasis, similar to preprocedural abdominal CT.  Under direct ultrasound guidance, a posterior inferior calix was targeted allowing advancement of an 10-French percutaneous nephrostomy catheter under intermittent fluoroscopic guidance. Contrast injection confirmed appropriate positioning.  IMPRESSION: Successful ultrasound and fluoroscopic guided placement of a right sided 10 French PCN.   Electronically Signed   By: Sandi Mariscal M.D.   On: 10/11/2013 18:08   Ir US Guide Bx Asp/drain  10/11/2013   INDICATION: History of obstructive rectal mass with associated mild right-sided ureterectasis and pelvicaliectasis, not suitable for retrograde ureteral stent placement per urologic consultation. Please perform ultrasound and fluoroscopic guided percutaneous nephrostomy catheter placement to preserve right-sided renal function and avoid suspected impending obstructive  uropathy.  EXAM: 1. ULTRASOUND GUIDANCE FOR PUNCTURE OF THE RIGHT RENAL COLLECTING SYSTEM 2. RIGHT PERCUTANEOUS NEPHROSTOMY TUBE PLACEMENT.  COMPARISON:  CT abdomen pelvis - 10/09/2013  MEDICATIONS: The patient is currently admitted to the hospital and receiving intravenous antibiotics. The antibiotic was administered in an appropriate time frame prior to skin puncture.  ANESTHESIA/SEDATION: Fentanyl 175 mcg IV; Versed 3.5 mg IV  Total Moderate Sedation Time  30 minutes.  CONTRAST:  20 mL Isovue 300 administered into the collecting system  FLUOROSCOPY TIME:  3 minutes 48 seconds.  COMPLICATIONS: None immediate  PROCEDURE: The procedure, risks, benefits, and alternatives were explained to the patient. Questions regarding the procedure were encouraged and answered. The patient understands and consents to the procedure. A timeout was performed prior to the initiation of the procedure.  The right flank region was prepped with Betadine in a sterile fashion, and a sterile drape was applied covering the operative field. A sterile gown and sterile gloves were used for the procedure. Local anesthesia was provided with 1% Lidocaine with epinephrine. Ultrasound was used to localize the right kidney. Under direct ultrasound guidance, a 21 gauge needle was advanced into the renal collecting system. An ultrasound image documentation was performed. Access within the collecting system was confirmed with the efflux of urine followed by limited contrast injection.  Over a Nitrex wire, the tract was  dilated with an Accustick stent. Over a guide wire, a 10-French percutaneous nephrostomy catheter was advanced into the collecting system where the coil was formed and locked. Contrast was injected and several sport radiographs were obtained in various obliquities confirming access. The catheter was secured at the skin with a Prolene retention suture and a gravity bag was placed. A dressing was placed. The patient tolerated procedure well  without immediate postprocedural complication.  FINDINGS: Ultrasound scanning demonstrates very mild right-sided pelvicaliectasis, similar to preprocedural abdominal CT.  Under direct ultrasound guidance, a posterior inferior calix was targeted allowing advancement of an 10-French percutaneous nephrostomy catheter under intermittent fluoroscopic guidance. Contrast injection confirmed appropriate positioning.  IMPRESSION: Successful ultrasound and fluoroscopic guided placement of a right sided 10 French PCN.   Electronically Signed   By: Sandi Mariscal M.D.   On: 10/11/2013 18:08    Anti-infectives: Anti-infectives   Start     Dose/Rate Route Frequency Ordered Stop   10/09/13 2130  ciprofloxacin (CIPRO) IVPB 400 mg     400 mg 200 mL/hr over 60 Minutes Intravenous Every 12 hours 10/09/13 2108     10/09/13 2130  metroNIDAZOLE (FLAGYL) IVPB 500 mg     500 mg 100 mL/hr over 60 Minutes Intravenous Every 8 hours 10/09/13 2109     10/09/13 2045  ciprofloxacin (CIPRO) IVPB 400 mg  Status:  Discontinued     400 mg 200 mL/hr over 60 Minutes Intravenous Every 12 hours 10/09/13 2041 10/09/13 2103   10/09/13 2045  metroNIDAZOLE (FLAGYL) IVPB 500 mg  Status:  Discontinued     500 mg 100 mL/hr over 60 Minutes Intravenous Every 8 hours 10/09/13 2042 10/09/13 2109      Assessment/Plan: Problem List: Patient Active Problem List   Diagnosis Date Noted  . 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 Ureteral obstruction, right 10/10/2013  . Anemia of chronic disease 10/10/2013  . Severe protein-calorie malnutrition 10/10/2013  . Microcytic anemia 10/10/2013  . Rectal mass 10/10/2013  . Colon cancer 10/09/2013    Will need diverting colostomy this next week per Dr. Ninfa Linden who is DOW.   2 Days Post-Op    LOS: 4 days   Matt B. Hassell Done, MD, [facility] [phone] beeper [phone]  10/13/2013 8:08 AM

## 2013-10-14 ENCOUNTER — Encounter (HOSPITAL_COMMUNITY): Payer: Self-pay | Admitting: Internal Medicine

## 2013-10-14 DIAGNOSIS — E46 Unspecified protein-calorie malnutrition: Secondary | ICD-10-CM

## 2013-10-14 LAB — CBC
HCT: 28.6 % — ABNORMAL LOW (ref 39.0–52.0)
HEMOGLOBIN: 8.9 g/dL — AB (ref 13.0–17.0)
MCH: 21.9 pg — AB (ref 26.0–34.0)
MCHC: 31.1 g/dL (ref 30.0–36.0)
MCV: 70.4 fL — AB (ref 78.0–100.0)
Platelets: 390 10*3/uL (ref 150–400)
RBC: 4.06 MIL/uL — ABNORMAL LOW (ref 4.22–5.81)
RDW: 22.4 % — ABNORMAL HIGH (ref 11.5–15.5)
WBC: 6.2 10*3/uL (ref 4.0–10.5)

## 2013-10-14 LAB — DIFFERENTIAL
BASOS ABS: 0 10*3/uL (ref 0.0–0.1)
Basophils Relative: 0 % (ref 0–1)
Eosinophils Absolute: 0.2 10*3/uL (ref 0.0–0.7)
Eosinophils Relative: 3 % (ref 0–5)
LYMPHS PCT: 15 % (ref 12–46)
Lymphs Abs: 0.9 10*3/uL (ref 0.7–4.0)
MONO ABS: 0.4 10*3/uL (ref 0.1–1.0)
Monocytes Relative: 6 % (ref 3–12)
Neutro Abs: 4.7 10*3/uL (ref 1.7–7.7)
Neutrophils Relative %: 76 % (ref 43–77)

## 2013-10-14 LAB — COMPREHENSIVE METABOLIC PANEL
ALT: 8 U/L (ref 0–53)
AST: 11 U/L (ref 0–37)
Albumin: 2 g/dL — ABNORMAL LOW (ref 3.5–5.2)
Alkaline Phosphatase: 55 U/L (ref 39–117)
BUN: 5 mg/dL — ABNORMAL LOW (ref 6–23)
CALCIUM: 8.4 mg/dL (ref 8.4–10.5)
CO2: 26 mEq/L (ref 19–32)
CREATININE: 0.94 mg/dL (ref 0.50–1.35)
Chloride: 101 mEq/L (ref 96–112)
GFR calc Af Amer: >90 mL/min (ref >90)
Glucose, Bld: 117 mg/dL — ABNORMAL HIGH (ref 70–99)
Potassium: 4.1 mEq/L (ref 3.7–5.3)
Sodium: 137 mEq/L (ref 137–147)
TOTAL PROTEIN: 5.6 g/dL — AB (ref 6.0–8.3)
Total Bilirubin: 0.3 mg/dL (ref 0.3–1.2)

## 2013-10-14 LAB — TRIGLYCERIDES: TRIGLYCERIDES: 88 mg/dL (ref <150)

## 2013-10-14 LAB — SURGICAL PCR SCREEN
MRSA, PCR: NEGATIVE
Staphylococcus aureus: NEGATIVE

## 2013-10-14 LAB — GLUCOSE, CAPILLARY
GLUCOSE-CAPILLARY: 126 mg/dL — AB (ref 70–99)
Glucose-Capillary: 106 mg/dL — ABNORMAL HIGH (ref 70–99)
Glucose-Capillary: 157 mg/dL — ABNORMAL HIGH (ref 70–99)
Glucose-Capillary: 180 mg/dL — ABNORMAL HIGH (ref 70–99)

## 2013-10-14 LAB — MAGNESIUM: MAGNESIUM: 2 mg/dL (ref 1.5–2.5)

## 2013-10-14 LAB — PHOSPHORUS: PHOSPHORUS: 3.2 mg/dL (ref 2.3–4.6)

## 2013-10-14 LAB — PREALBUMIN: PREALBUMIN: 7.7 mg/dL — AB (ref 17.0–34.0)

## 2013-10-14 MED ORDER — TRACE MINERALS CR-CU-F-FE-I-MN-MO-SE-ZN IV SOLN
INTRAVENOUS | Status: AC
  Administered 2013-10-14: 17:00:00 via INTRAVENOUS
  Filled 2013-10-14: qty 2000

## 2013-10-14 MED ORDER — FAT EMULSION 20 % IV EMUL
250.0000 mL | INTRAVENOUS | Status: AC
  Administered 2013-10-14: 250 mL via INTRAVENOUS
  Filled 2013-10-14: qty 250

## 2013-10-14 MED ORDER — HEPARIN SODIUM (PORCINE) 5000 UNIT/ML IJ SOLN
5000.0000 [IU] | Freq: Three times a day (TID) | INTRAMUSCULAR | Status: DC
Start: 2013-10-14 — End: 2013-10-15
  Administered 2013-10-14 (×2): 5000 [IU] via SUBCUTANEOUS
  Filled 2013-10-14 (×4): qty 1

## 2013-10-14 MED ORDER — SODIUM CHLORIDE 0.9 % IV SOLN
INTRAVENOUS | Status: DC
  Administered 2013-10-14: 19:00:00 via INTRAVENOUS

## 2013-10-14 NOTE — Progress Notes (Signed)
3 Days Post-Op  Subjective: Rt PCN placed 5/29 Great output Dx: colorectal mass- obstruction For OR in am  Objective: Vital signs in last 24 hours: Temp:  [98 F (36.7 C)-98.3 F (36.8 C)] 98.3 F (36.8 C) (06/01 0550) Pulse Rate:  [77-84] 77 (06/01 0550) Resp:  [16-20] 20 (06/01 0550) BP: (124-151)/(69-90) 124/80 mmHg (06/01 0550) SpO2:  [100 %] 100 % (06/01 0550) Weight:  [70.761 kg (156 lb)] 70.761 kg (156 lb) (06/01 0550) Last BM Date: 10/12/13  Intake/Output from previous day: 05/31 0701 - 06/01 0700 In: 7354.3 [P.O.:1082; I.V.:1499.3; IV Piggyback:3200; TPN:1573] Out: 1638 [Urine:3750] Intake/Output this shift: Total I/O In: 200 [IV Piggyback:200] Out: 1200 [Urine:1200]  PE:  Afeb; vss Rt PCN intact Output great 3.1 L yesterday 1.2 L output today so far: yellow/clear Site clean and dry; NT Bun/Cr steady decrease  Lab Results:   Recent Labs  10/13/13 0600 10/14/13 0500  WBC 7.4 6.2  HGB 8.8* 8.9*  HCT 28.4* 28.6*  PLT 369 390   BMET  Recent Labs  10/13/13 0600 10/14/13 0500  NA 136* 137  K 3.5* 4.1  CL 101 101  CO2 23 26  GLUCOSE 103* 117*  BUN 6 5*  CREATININE 0.98 0.94  CALCIUM 8.2* 8.4   PT/INR No results found for this basename: LABPROT, INR,  in the last 72 hours ABG No results found for this basename: PHART, PCO2, PO2, HCO3,  in the last 72 hours  Studies/Results: No results found.  Anti-infectives: Anti-infectives   Start     Dose/Rate Route Frequency Ordered Stop   10/09/13 2130  ciprofloxacin (CIPRO) IVPB 400 mg     400 mg 200 mL/hr over 60 Minutes Intravenous Every 12 hours 10/09/13 2108     10/09/13 2130  metroNIDAZOLE (FLAGYL) IVPB 500 mg     500 mg 100 mL/hr over 60 Minutes Intravenous Every 8 hours 10/09/13 2109     10/09/13 2045  ciprofloxacin (CIPRO) IVPB 400 mg  Status:  Discontinued     400 mg 200 mL/hr over 60 Minutes Intravenous Every 12 hours 10/09/13 2041 10/09/13 2103   10/09/13 2045  metroNIDAZOLE (FLAGYL)  IVPB 500 mg  Status:  Discontinued     500 mg 100 mL/hr over 60 Minutes Intravenous Every 8 hours 10/09/13 2042 10/09/13 2109      Assessment/Plan: s/p Procedure(s): COLONOSCOPY (N/A)  Rt PCN intact Doing well For OR in am: diverting loop colostomy for colorectal mass Will follow Plan per [facility]   LOS: 5 days    Lavonia Drafts 10/14/2013

## 2013-10-14 NOTE — Progress Notes (Signed)
I have seen and examined the patient and agree with the assessment and plans. Plan lap assisted diverting loop colostomy tomorrow I discussed this with him in detail.  I discussed the risks which include but are not limited to bleeding, infection, injury to surrounding structures, etc.  Gwendolyn Nishi A. Ninfa Linden  MD, FACS

## 2013-10-14 NOTE — Progress Notes (Signed)
Patient ID: Grant [name], male   DOB: 26-Jun-1955, 58 y.o.   MRN: [id number] 3 Days Post-Op  Subjective: Pt feels ok today.  Not much abdominal pain.  Passing some liquid and minimal solid stool.  Objective: Vital signs in last 24 hours: Temp:  [98 F (36.7 C)-98.3 F (36.8 C)] 98.3 F (36.8 C) (06/01 0550) Pulse Rate:  [77-84] 77 (06/01 0550) Resp:  [16-20] 20 (06/01 0550) BP: (124-151)/(69-90) 124/80 mmHg (06/01 0550) SpO2:  [100 %] 100 % (06/01 0550) Weight:  [156 lb (70.761 kg)] 156 lb (70.761 kg) (06/01 0550) Last BM Date: 10/12/13  Intake/Output from previous day: 05/31 0701 - 06/01 0700 In: 4154.3 [P.O.:1082; I.V.:1499.3; JQB:3419] Out: 3750 [Urine:3750] Intake/Output this shift:    PE: Abd: soft, NT, ND, +BS, nephrostomy tube in place with clear yellow output Heart: regular Lungs: CTAB  Lab Results:   Recent Labs  10/13/13 0600 10/14/13 0500  WBC 7.4 6.2  HGB 8.8* 8.9*  HCT 28.4* 28.6*  PLT 369 390   BMET  Recent Labs  10/13/13 0600 10/14/13 0500  NA 136* 137  K 3.5* 4.1  CL 101 101  CO2 23 26  GLUCOSE 103* 117*  BUN 6 5*  CREATININE 0.98 0.94  CALCIUM 8.2* 8.4   PT/INR No results found for this basename: LABPROT, INR,  in the last 72 hours CMP     Component Value Date/Time   NA 137 10/14/2013 0500   K 4.1 10/14/2013 0500   CL 101 10/14/2013 0500   CO2 26 10/14/2013 0500   GLUCOSE 117* 10/14/2013 0500   BUN 5* 10/14/2013 0500   CREATININE 0.94 10/14/2013 0500   CALCIUM 8.4 10/14/2013 0500   PROT 5.6* 10/14/2013 0500   ALBUMIN 2.0* 10/14/2013 0500   AST 11 10/14/2013 0500   ALT 8 10/14/2013 0500   ALKPHOS 55 10/14/2013 0500   BILITOT 0.3 10/14/2013 0500   GFRNONAA >90 10/14/2013 0500   GFRAA >90 10/14/2013 0500   Lipase     Component Value Date/Time   LIPASE 10* 10/09/2013 1331       Studies/Results: No results found.  Anti-infectives: Anti-infectives   Start     Dose/Rate Route Frequency Ordered Stop   10/09/13 2130  ciprofloxacin (CIPRO) IVPB 400  mg     400 mg 200 mL/hr over 60 Minutes Intravenous Every 12 hours 10/09/13 2108     10/09/13 2130  metroNIDAZOLE (FLAGYL) IVPB 500 mg     500 mg 100 mL/hr over 60 Minutes Intravenous Every 8 hours 10/09/13 2109     10/09/13 2045  ciprofloxacin (CIPRO) IVPB 400 mg  Status:  Discontinued     400 mg 200 mL/hr over 60 Minutes Intravenous Every 12 hours 10/09/13 2041 10/09/13 2103   10/09/13 2045  metroNIDAZOLE (FLAGYL) IVPB 500 mg  Status:  Discontinued     500 mg 100 mL/hr over 60 Minutes Intravenous Every 8 hours 10/09/13 2042 10/09/13 2109       Assessment/Plan  1. Rectal mass with obstructing right ureter and near obstruction of colon  2. Right hydronephrosis, secondary to #1, s/p Nephrostomy tube placement 3. 3 pulmonary nodules, unable to characterize 4. PCM/TNA  Plan: 1. Cont TNA for PCM 2. Clear liquids today, NPO p MN  3. Plans for diverting loop colostomy tomorrow for near obstructing rectal mass, presumed colorectal adenoca 4. Hold heparin tomorrow 5. Discussed plan with patient.  He understands and is willing to proceed.  LOS: 5 days    Henreitta Cea  10/14/2013, 8:28 AM Pager: [phone]

## 2013-10-14 NOTE — Progress Notes (Addendum)
PARENTERAL NUTRITION CONSULT NOTE - FOLLOW UP  Pharmacy Consult for TNA Indication: Severe malnutrition in setting of bowel obstruction due to colon cancer  Allergies  Allergen Reactions  . Strawberry Anaphylaxis and Hives  . Sunflower Oil Anaphylaxis and Hives    seeds  . Watermelon [Citrullus Vulgaris] Anaphylaxis and Hives    Patient Measurements: Height: 6' 6"  (198.1 cm) Weight: 156 lb (70.761 kg) IBW/kg (Calculated) : 91.4  Vital Signs: Temp: 98.3 F (36.8 C) (06/01 0550) Temp src: Oral (06/01 0550) BP: 124/80 mmHg (06/01 0550) Pulse Rate: 77 (06/01 0550) Intake/Output from previous day: 05/31 0701 - 06/01 0700 In: 7354.3 [P.O.:1082; I.V.:1499.3; IV Piggyback:3200; EQA:8341] Out: 9622 [Urine:3750] Intake/Output from this shift: Total I/O In: 200 [IV Piggyback:200] Out: 1200 [Urine:1200]  Labs:  Recent Labs  10/12/13 0415 10/13/13 0600 10/14/13 0500  WBC 7.7 7.4 6.2  HGB 8.9* 8.8* 8.9*  HCT 28.2* 28.4* 28.6*  PLT 381 369 390     Recent Labs  10/13/13 0600 10/14/13 0500  NA 136* 137  K 3.5* 4.1  CL 101 101  CO2 23 26  GLUCOSE 103* 117*  BUN 6 5*  CREATININE 0.98 0.94  CALCIUM 8.2* 8.4  MG 2.1 2.0  PHOS 2.8 3.2  PROT 5.7* 5.6*  ALBUMIN 2.1* 2.0*  AST 11 11  ALT 9 8  ALKPHOS 58 55  BILITOT 0.4 0.3  TRIG 96 88   Estimated Creatinine Clearance: 87.9 ml/min (by C-G formula based on Cr of 0.94).    Recent Labs  10/13/13 1640 10/14/13 0412 10/14/13 0952  GLUCAP 111* 106* 157*     Insulin Requirements in the past 24 hours:  None (CBG 105-157)  Assessment: 57 YOM presented with intermittent diarrhea with 25 lb unintentional weight loss. Imaging revealed a large mass that is obstructing the colon >> invasive colon cancer. A bowel movement is charted from this morning but per RN very small d/t obstruction. The patient cannot be advanced from clears and will likely be taken for surgical intervention soon.  GI:  S/p colonoscopy with biopsy  on 5/29 - awaiting final pathology results. Per colonoscopy note there was a "lumen occluding large friable malignant-appearing mass in the rectum that the scope could not be passed beyond". Pt likely to need diverting colostomy.. Pre-albumin 5.5 (5/27), alb 2.1 (5/31)  Endo: No hx. CBG 105-157  Lytes: Lytes WNL, K 4.1. Mg 2, Phos 3.2 - will monitor daily while initiating TNA to monitor for refeeding syndrome  Renal: SCr 0.94, CrCl~80-90 ml/min  Pulm:   Cards: No hx. BP/HR wnl. On prn hydralazine  Hepatobil: LFTs/Tbili/Alk Phos wnl. TG 96 (5/31)  Neuro: No sedation, c/o R-hip pain - on prn morphine  ID: Cipro + Flagyl D#6 (5/27 >> ) for possible perforation in the setting of a colon mass. Ecoli in [place]. Afebrile, WBC wnl  Best Practices: Hep SQ  TPN Access: R-double lumen PICC placed 5/31  TPN day#: 1 (5/31 >>)  Current Nutrition:  Clear liquids + Resource Breeze tidwc  Nutritional Goals:  2200-2500 kCal, 110-115 grams of protein per day  **Goal rate of Clinimix E 5/15 at 86m/hr will provide 1758 kcal and 90 grams protein per day *[employer] CorporationTID= 750kcal/day, 27g protein daily Total: 2508 kcal and 117g protein daily. Calculations do not include clear liquid diet consumption.  Plan:  - Change formula to Clinimix E 5/15 at 725mhr + lipids at 104mr  - Add MV and TE daily - Reduce IVF to kvo ml/hr when TPN  initiated this evening   Staphanie Harbison S. Alford Highland, PharmD, [facility] Clinical Staff Pharmacist Pager [phone]   10/14/2013 10:43 AM

## 2013-10-14 NOTE — Progress Notes (Addendum)
Patient ID: Grant [name], male   DOB: 20-Jan-1956, 58 y.o.   MRN: [id number] [facility] PROGRESS NOTE  Grant [name] HFW:[id number] DOB: 1955/09/04 DOA: 10/09/2013 PCP: No PCP Per Patient  Brief narrative: 58 year old male with no significant past medical history who presented to [facility] ED 10/09/2013 with reports of intermittent diarrhea, significant weight loss of about 20 pounds in last month, unintentional. He did have intentional 150 pound weight loss over past few years. In ED, patient had CT abdomen and pelvis and chest and was found to have large mass in the rectosigmoid colon concerning for locally invasive colorectal cancer likely with contained perforation. There was also gas seen within the urinary bladder concerning for colovesical fistula. Small bilateral lower lobe pulmonary nodules were also seen. Pt underwent colonoscopy and right PCN placement with biopsy on 529/2015. Patient was started on TPN 10/14/2013 for nutritional support. Plan is for diverting colostomy 10/15/2013.  Assessment and plan:   Principal problem:  Locally advanced rectal cancer and possible contained perforation  Pt underwent colonoscopy on 10/11/2013. Plan per surgery, diverting colostomy 10/15/2013 CEA was 3.7 WNL, CA 19-9 is 10.3 (low), CEA 125 is 7.2 (WNL), AFP is 1.7 (WNL), pre-albumin is low at 5.5.  Dr. Annamaria Boots of oncology has seen and evaluated the pt.Oncologic treatment options will be dependant on tissue pathology diagnosis. Pathology results are consistent with adenocarcinoma. Continue cipro and flagyl. Active Problems:  Large pelvic mass with rectal cancer and possible bladder and right ureteral obstruction  Patient was seen and evaluated by urology. Per urology patient is not a good candidate for a cystectomy and urinary diversion with primary surgical resection of the pelvic mass. They did however recommend a right percutaneous nephrostomy which he had done in 10/11/2013. No subsequent  complications. Colovesical fistula  Per urology recommendation, continue treatment for UTI. Will followup with urology and surgery when is a good time to do repeat CT scan. Acute blood loss anemia  Likely secondary to advanced colorectal cancer  Has received total of 4 units PRBC blood transfusion since admission; 2 units on admission and then 2 units 10/09/2013  Hemoglobin remains stable at 8.8, 8.9 Severe protein calorie malnutrition  Low albumin at 5.5, low albumin level at 2.1 and significant weight loss of 20 pounds in the past one month  TNA started 10/14/2013 for nutritional support.  Clear liquid diet   DVT prophylaxis: heparin sub Q  IV access: PICC line   Code Status: full code  Family Communication: plan of care discussed with the patient at the bedside  Disposition Plan: home when stable    Consultants:  Urology (Dr. Alinda Money)  Oncology (Dr. Annamaria Boots) Surgery Procedures:  Colonoscopy 10/11/2013  Right PCN with biopsy 10/11/2013  TPN 10/12/2013  Plan for diverting colostomy 10/15/2013 Antibiotics:  Cipro 10/09/2013 -->  Flagyl 10/09/2013 -->   [name], MD  [facility] Pager [phone]  If 7PM-7AM, please contact night-coverage [email or web address] Password [email or web address] 10/14/2013, 10:04 AM   LOS: 5 days    HPI/Subjective: No acute overnight events.  Objective: Filed Vitals:   10/13/13 0600 10/13/13 1343 10/13/13 2112 10/14/13 0550  BP: 121/76 151/69 137/90 124/80  Pulse: 77 81 84 77  Temp: 98 F (36.7 C) 98 F (36.7 C) 98.3 F (36.8 C) 98.3 F (36.8 C)  TempSrc: Oral Oral Oral Oral  Resp: 15 16 20 20   Height:      Weight: 73.936 kg (163 lb)   70.761 kg (156 lb)  SpO2: 100% 100% 100% 100%  Intake/Output Summary (Last 24 hours) at 10/14/13 1004 Last data filed at 10/14/13 1000  Gross per 24 hour  Intake   6298 ml  Output   4750 ml  Net   1548 ml    Exam:  General: no acute distress, alert and awake Cardiovascular: Regular rate and rhythm, S1/S2  appreciated  Respiratory: Bilateral air entry, no wheezing or rhonchi Abdomen: Soft, non tender, non distended, PCN in place Extremities: No edema, pulses DP and PT palpable bilaterally  Neuro: Grossly nonfocal  Data Reviewed: Basic Metabolic Panel:  Recent Labs Lab 10/09/13 1331 10/09/13 2138 10/10/13 0345 10/13/13 0600 10/14/13 0500  NA 130* 131* 132* 136* 137  K 4.5 4.1 3.9 3.5* 4.1  CL 88* 94* 94* 101 101  CO2 27 24 25 23 26   GLUCOSE 200* 206* 102* 103* 117*  BUN 20 18 16 6  5*  CREATININE 1.28 1.14 1.14 0.98 0.94  CALCIUM 9.3 8.7 8.6 8.2* 8.4  MG  --  1.9  --  2.1 2.0  PHOS  --  3.0  --  2.8 3.2   Liver Function Tests:  Recent Labs Lab 10/09/13 1331 10/09/13 2138 10/10/13 0345 10/13/13 0600 10/14/13 0500  AST 11 13 13 11 11   ALT 8 10 8 9 8   ALKPHOS 97 90 81 58 55  BILITOT 0.4 0.4 0.3 0.4 0.3  PROT 7.1 6.4 6.2 5.7* 5.6*  ALBUMIN 2.5* 2.2* 2.1* 2.1* 2.0*    Recent Labs Lab 10/09/13 1331  LIPASE 10*   No results found for this basename: AMMONIA,  in the last 168 hours CBC:  Recent Labs Lab 10/09/13 1331 10/10/13 0345 10/10/13 0502 10/11/13 0901 10/12/13 0415 10/13/13 0600 10/14/13 0500  WBC 12.8* 8.4  --  6.5 7.7 7.4 6.2  NEUTROABS 12.0*  --   --   --   --  6.2 4.7  HGB 7.3* 6.0* 7.6* 6.8* 8.9* 8.8* 8.9*  HCT 24.7* 19.7* 24.2* 21.9* 28.2* 28.4* 28.6*  MCV 62.7* 62.3*  --  66.2* 69.5* 69.8* 70.4*  PLT 479* 377  --  387 381 369 390   Cardiac Enzymes:  Recent Labs Lab 10/09/13 1331  TROPONINI <0.30   BNP: No components found with this basename: POCBNP,  CBG:  Recent Labs Lab 10/12/13 0805 10/13/13 0857 10/13/13 1640 10/14/13 0412 10/14/13 0952  GLUCAP 121* 105* 111* 106* 157*    URINE CULTURE     Status: None   Collection Time    10/09/13  5:22 PM      Result Value Ref Range Status   Specimen Description URINE, CLEAN CATCH   Final   Value: ESCHERICHIA COLI   Organism ID, Bacteria ESCHERICHIA COLI   Final     Studies: No  results found.  Scheduled Meds: . ciprofloxacin  400 mg Intravenous Q12H  . feeding supplement   1 Container Oral TID WC  . heparin  5,000 Units Subcutaneous 3 times per day  . insulin aspart  0-9 Units Subcutaneous Q6H  . metronidazole  500 mg Intravenous Q8H  . peg 3350 powder  0.5 kit Oral Once   Continuous Infusions: . sodium chloride    . Marland KitchenTPN (CLINIMIX-E) Adult 40 mL/hr at 10/13/13 1739   And  . fat emulsion 250 mL (10/13/13 1739)

## 2013-10-15 ENCOUNTER — Encounter (HOSPITAL_COMMUNITY): Payer: Medicaid Other | Admitting: Certified Registered Nurse Anesthetist

## 2013-10-15 ENCOUNTER — Inpatient Hospital Stay (HOSPITAL_COMMUNITY): Payer: Medicaid Other | Admitting: Certified Registered Nurse Anesthetist

## 2013-10-15 ENCOUNTER — Encounter (HOSPITAL_COMMUNITY)
Admission: EM | Disposition: A | Discharge status: Home Health | Payer: Self-pay | Source: Home / Self Care | Attending: Internal Medicine

## 2013-10-15 ENCOUNTER — Encounter (HOSPITAL_COMMUNITY): Payer: Self-pay | Admitting: Certified Registered Nurse Anesthetist

## 2013-10-15 DIAGNOSIS — C20 Malignant neoplasm of rectum: Secondary | ICD-10-CM

## 2013-10-15 HISTORY — PX: COLON RESECTION: SHX5231

## 2013-10-15 LAB — BASIC METABOLIC PANEL
BUN: 12 mg/dL (ref 6–23)
CALCIUM: 8.9 mg/dL (ref 8.4–10.5)
CO2: 29 mEq/L (ref 19–32)
Chloride: 99 mEq/L (ref 96–112)
Creatinine, Ser: 0.94 mg/dL (ref 0.50–1.35)
Glucose, Bld: 129 mg/dL — ABNORMAL HIGH (ref 70–99)
Potassium: 4.4 mEq/L (ref 3.7–5.3)
SODIUM: 137 meq/L (ref 137–147)

## 2013-10-15 LAB — GLUCOSE, CAPILLARY
GLUCOSE-CAPILLARY: 131 mg/dL — AB (ref 70–99)
GLUCOSE-CAPILLARY: 131 mg/dL — AB (ref 70–99)
GLUCOSE-CAPILLARY: 135 mg/dL — AB (ref 70–99)
Glucose-Capillary: 161 mg/dL — ABNORMAL HIGH (ref 70–99)
Glucose-Capillary: 150 mg/dL — ABNORMAL HIGH (ref 70–99)

## 2013-10-15 LAB — CBC
HEMATOCRIT: 30.4 % — AB (ref 39.0–52.0)
Hemoglobin: 9.3 g/dL — ABNORMAL LOW (ref 13.0–17.0)
MCH: 21.7 pg — ABNORMAL LOW (ref 26.0–34.0)
MCHC: 30.6 g/dL (ref 30.0–36.0)
MCV: 71 fL — ABNORMAL LOW (ref 78.0–100.0)
Platelets: 408 10*3/uL — ABNORMAL HIGH (ref 150–400)
RBC: 4.28 MIL/uL (ref 4.22–5.81)
RDW: 23.2 % — ABNORMAL HIGH (ref 11.5–15.5)
WBC: 6.5 10*3/uL (ref 4.0–10.5)

## 2013-10-15 SURGERY — COLON RESECTION LAPAROSCOPIC
Anesthesia: General | Site: Abdomen

## 2013-10-15 MED ORDER — ROCURONIUM BROMIDE 100 MG/10ML IV SOLN
INTRAVENOUS | Status: DC | PRN
  Administered 2013-10-15: 50 mg via INTRAVENOUS

## 2013-10-15 MED ORDER — MIDAZOLAM HCL 5 MG/5ML IJ SOLN
INTRAMUSCULAR | Status: DC | PRN
  Administered 2013-10-15: 2 mg via INTRAVENOUS

## 2013-10-15 MED ORDER — ONDANSETRON HCL 4 MG/2ML IJ SOLN: 4.0000 mg | Freq: Four times a day (QID) | INTRAMUSCULAR | Status: DC | PRN

## 2013-10-15 MED ORDER — TRACE MINERALS CR-CU-F-FE-I-MN-MO-SE-ZN IV SOLN
INTRAVENOUS | Status: DC
  Administered 2013-10-15: 18:00:00 via INTRAVENOUS
  Filled 2013-10-15: qty 2000

## 2013-10-15 MED ORDER — HYDROMORPHONE HCL PF 1 MG/ML IJ SOLN
INTRAMUSCULAR | Status: AC
  Filled 2013-10-15: qty 1

## 2013-10-15 MED ORDER — PROPOFOL 10 MG/ML IV BOLUS
INTRAVENOUS | Status: DC | PRN
  Administered 2013-10-15: 50 mg via INTRAVENOUS
  Administered 2013-10-15: 150 mg via INTRAVENOUS

## 2013-10-15 MED ORDER — FENTANYL CITRATE 0.05 MG/ML IJ SOLN
INTRAMUSCULAR | Status: AC
  Filled 2013-10-15: qty 5

## 2013-10-15 MED ORDER — BUPIVACAINE-EPINEPHRINE 0.25% -1:200000 IJ SOLN
INTRAMUSCULAR | Status: DC | PRN
  Administered 2013-10-15: 10 mL

## 2013-10-15 MED ORDER — LIDOCAINE HCL (CARDIAC) 20 MG/ML IV SOLN
INTRAVENOUS | Status: AC
  Filled 2013-10-15: qty 5

## 2013-10-15 MED ORDER — MIDAZOLAM HCL 2 MG/2ML IJ SOLN
INTRAMUSCULAR | Status: AC
  Filled 2013-10-15: qty 2

## 2013-10-15 MED ORDER — FAT EMULSION 20 % IV EMUL
250.0000 mL | INTRAVENOUS | Status: DC
  Administered 2013-10-15: 250 mL via INTRAVENOUS
  Filled 2013-10-15: qty 250

## 2013-10-15 MED ORDER — LACTATED RINGERS IV SOLN
INTRAVENOUS | Status: DC
  Administered 2013-10-15: 09:00:00 via INTRAVENOUS

## 2013-10-15 MED ORDER — OXYCODONE HCL 5 MG/5ML PO SOLN: 5.0000 mg | Freq: Once | ORAL | Status: DC | PRN

## 2013-10-15 MED ORDER — OXYCODONE HCL 5 MG PO TABS: 5.0000 mg | ORAL_TABLET | Freq: Once | ORAL | Status: DC | PRN

## 2013-10-15 MED ORDER — GLYCOPYRROLATE 0.2 MG/ML IJ SOLN
INTRAMUSCULAR | Status: AC
  Filled 2013-10-15: qty 3

## 2013-10-15 MED ORDER — ONDANSETRON HCL 4 MG/2ML IJ SOLN
INTRAMUSCULAR | Status: AC
  Filled 2013-10-15: qty 2

## 2013-10-15 MED ORDER — NEOSTIGMINE METHYLSULFATE 10 MG/10ML IV SOLN
INTRAVENOUS | Status: DC | PRN
  Administered 2013-10-15: 4 mg via INTRAVENOUS

## 2013-10-15 MED ORDER — LACTATED RINGERS IV SOLN
INTRAVENOUS | Status: DC | PRN
  Administered 2013-10-15: 09:00:00 via INTRAVENOUS

## 2013-10-15 MED ORDER — FENTANYL CITRATE 0.05 MG/ML IJ SOLN
INTRAMUSCULAR | Status: DC | PRN
  Administered 2013-10-15 (×2): 50 ug via INTRAVENOUS
  Administered 2013-10-15: 150 ug via INTRAVENOUS

## 2013-10-15 MED ORDER — 0.9 % SODIUM CHLORIDE (POUR BTL) OPTIME
TOPICAL | Status: DC | PRN
  Administered 2013-10-15 (×2): 1000 mL

## 2013-10-15 MED ORDER — HYDROMORPHONE HCL PF 1 MG/ML IJ SOLN
0.2500 mg | INTRAMUSCULAR | Status: DC | PRN
Start: 2013-10-15 — End: 2013-10-15
  Administered 2013-10-15 (×4): 0.5 mg via INTRAVENOUS

## 2013-10-15 MED ORDER — MORPHINE SULFATE 4 MG/ML IJ SOLN
INTRAMUSCULAR | Status: AC
  Administered 2013-10-15: 4 mg
  Filled 2013-10-15: qty 1

## 2013-10-15 MED ORDER — ROCURONIUM BROMIDE 50 MG/5ML IV SOLN
INTRAVENOUS | Status: AC
  Filled 2013-10-15: qty 1

## 2013-10-15 MED ORDER — LIDOCAINE HCL (CARDIAC) 20 MG/ML IV SOLN
INTRAVENOUS | Status: DC | PRN
  Administered 2013-10-15: 100 mg via INTRAVENOUS

## 2013-10-15 MED ORDER — HYDROMORPHONE HCL PF 1 MG/ML IJ SOLN
1.0000 mg | INTRAMUSCULAR | Status: DC | PRN
  Administered 2013-10-15 – 2013-10-17 (×14): 1 mg via INTRAVENOUS
  Filled 2013-10-15 (×14): qty 1

## 2013-10-15 MED ORDER — NEOSTIGMINE METHYLSULFATE 10 MG/10ML IV SOLN
INTRAVENOUS | Status: AC
  Filled 2013-10-15: qty 1

## 2013-10-15 MED ORDER — BUPIVACAINE-EPINEPHRINE (PF) 0.25% -1:200000 IJ SOLN
INTRAMUSCULAR | Status: AC
  Filled 2013-10-15: qty 30

## 2013-10-15 MED ORDER — ONDANSETRON HCL 4 MG/2ML IJ SOLN
INTRAMUSCULAR | Status: DC | PRN
  Administered 2013-10-15: 4 mg via INTRAVENOUS

## 2013-10-15 MED ORDER — PROPOFOL 10 MG/ML IV BOLUS
INTRAVENOUS | Status: AC
  Filled 2013-10-15: qty 20

## 2013-10-15 MED ORDER — SODIUM CHLORIDE 0.9 % IV SOLN
INTRAVENOUS | Status: DC
  Administered 2013-10-15 – 2013-10-19 (×8): via INTRAVENOUS

## 2013-10-15 MED ORDER — GLYCOPYRROLATE 0.2 MG/ML IJ SOLN
INTRAMUSCULAR | Status: DC | PRN
  Administered 2013-10-15: 0.6 mg via INTRAVENOUS

## 2013-10-15 MED ORDER — HEPARIN SODIUM (PORCINE) 5000 UNIT/ML IJ SOLN
5000.0000 [IU] | Freq: Three times a day (TID) | INTRAMUSCULAR | Status: DC
  Administered 2013-10-16 – 2013-10-18 (×7): 5000 [IU] via SUBCUTANEOUS
  Filled 2013-10-15 (×10): qty 1

## 2013-10-15 MED ORDER — MORPHINE SULFATE 2 MG/ML IJ SOLN
2.0000 mg | INTRAMUSCULAR | Status: DC | PRN
  Administered 2013-10-15 (×3): 4 mg via INTRAVENOUS
  Filled 2013-10-15 (×3): qty 2

## 2013-10-15 SURGICAL SUPPLY — 67 items
APPLIER CLIP ROT 10 11.4 M/L (STAPLE)
BLADE SURG ROTATE 9660 (MISCELLANEOUS) IMPLANT
CANISTER SUCTION 2500CC (MISCELLANEOUS) ×3 IMPLANT
CELLS DAT CNTRL 66122 CELL SVR (MISCELLANEOUS) IMPLANT
CHLORAPREP W/TINT 26ML (MISCELLANEOUS) ×3 IMPLANT
CLIP APPLIE ROT 10 11.4 M/L (STAPLE) IMPLANT
COVER MAYO STAND STRL (DRAPES) ×3 IMPLANT
COVER SURGICAL LIGHT HANDLE (MISCELLANEOUS) ×6 IMPLANT
DERMABOND ADVANCED (GAUZE/BANDAGES/DRESSINGS) ×2
DERMABOND ADVANCED .7 DNX12 (GAUZE/BANDAGES/DRESSINGS) ×1 IMPLANT
DRAPE PROXIMA HALF (DRAPES) ×3 IMPLANT
DRAPE UTILITY 15X26 W/TAPE STR (DRAPE) ×6 IMPLANT
DRAPE WARM FLUID 44X44 (DRAPE) ×3 IMPLANT
DRSG OPSITE POSTOP 4X10 (GAUZE/BANDAGES/DRESSINGS) IMPLANT
DRSG OPSITE POSTOP 4X8 (GAUZE/BANDAGES/DRESSINGS) IMPLANT
ELECT BLADE 6.5 EXT (BLADE) ×3 IMPLANT
ELECT CAUTERY BLADE 6.4 (BLADE) ×6 IMPLANT
ELECT REM PT RETURN 9FT ADLT (ELECTROSURGICAL) ×3
ELECTRODE REM PT RTRN 9FT ADLT (ELECTROSURGICAL) ×1 IMPLANT
GEL ULTRASOUND 20GR AQUASONIC (MISCELLANEOUS) IMPLANT
GLOVE BIOGEL PI IND STRL 7.0 (GLOVE) ×3 IMPLANT
GLOVE BIOGEL PI INDICATOR 7.0 (GLOVE) ×6
GLOVE SURG SIGNA 7.5 PF LTX (GLOVE) ×3 IMPLANT
GLOVE SURG SS PI 7.0 STRL IVOR (GLOVE) ×6 IMPLANT
GOWN STRL REUS W/ TWL LRG LVL3 (GOWN DISPOSABLE) ×2 IMPLANT
GOWN STRL REUS W/ TWL XL LVL3 (GOWN DISPOSABLE) ×1 IMPLANT
GOWN STRL REUS W/TWL LRG LVL3 (GOWN DISPOSABLE) ×4
GOWN STRL REUS W/TWL XL LVL3 (GOWN DISPOSABLE) ×2
KIT BASIN OR (CUSTOM PROCEDURE TRAY) ×3 IMPLANT
KIT OSTOMY DRAINABLE 2.75 STR (WOUND CARE) ×3 IMPLANT
LEGGING LITHOTOMY PAIR STRL (DRAPES) IMPLANT
NS IRRIG 1000ML POUR BTL (IV SOLUTION) ×6 IMPLANT
OSTOMY BRIDGE 2 1/2 (MISCELLANEOUS) ×3 IMPLANT
PAD ARMBOARD 7.5X6 YLW CONV (MISCELLANEOUS) ×6 IMPLANT
PENCIL BUTTON HOLSTER BLD 10FT (ELECTRODE) ×3 IMPLANT
RTRCTR WOUND ALEXIS 18CM MED (MISCELLANEOUS)
SCALPEL HARMONIC ACE (MISCELLANEOUS) ×3 IMPLANT
SCISSORS LAP 5X35 DISP (ENDOMECHANICALS) ×3 IMPLANT
SET IRRIG TUBING LAPAROSCOPIC (IRRIGATION / IRRIGATOR) IMPLANT
SLEEVE ENDOPATH XCEL 5M (ENDOMECHANICALS) ×3 IMPLANT
SPECIMEN JAR LARGE (MISCELLANEOUS) IMPLANT
STAPLER VISISTAT 35W (STAPLE) ×3 IMPLANT
SURGILUBE 2OZ TUBE FLIPTOP (MISCELLANEOUS) IMPLANT
SUT ETHILON 3 0 PS 1 (SUTURE) ×3 IMPLANT
SUT PDS AB 1 TP1 96 (SUTURE) ×6 IMPLANT
SUT PROLENE 2 0 CT2 30 (SUTURE) IMPLANT
SUT PROLENE 2 0 KS (SUTURE) IMPLANT
SUT SILK 2 0 SH CR/8 (SUTURE) ×3 IMPLANT
SUT SILK 2 0 TIES 10X30 (SUTURE) ×3 IMPLANT
SUT SILK 3 0 SH CR/8 (SUTURE) ×3 IMPLANT
SUT SILK 3 0 TIES 10X30 (SUTURE) ×3 IMPLANT
SUT VIC AB 3-0 SH 18 (SUTURE) ×3 IMPLANT
SYR BULB IRRIGATION 50ML (SYRINGE) IMPLANT
SYS LAPSCP GELPORT 120MM (MISCELLANEOUS)
SYSTEM LAPSCP GELPORT 120MM (MISCELLANEOUS) IMPLANT
TOWEL OR 17X26 10 PK STRL BLUE (TOWEL DISPOSABLE) ×3 IMPLANT
TRAY FOLEY CATH 16FRSI W/METER (SET/KITS/TRAYS/PACK) IMPLANT
TRAY LAPAROSCOPIC (CUSTOM PROCEDURE TRAY) ×3 IMPLANT
TRAY PROCTOSCOPIC FIBER OPTIC (SET/KITS/TRAYS/PACK) IMPLANT
TROCAR XCEL 12X100 BLDLESS (ENDOMECHANICALS) IMPLANT
TROCAR XCEL BLUNT TIP 100MML (ENDOMECHANICALS) ×3 IMPLANT
TROCAR XCEL NON-BLD 11X100MML (ENDOMECHANICALS) IMPLANT
TROCAR XCEL NON-BLD 5MMX100MML (ENDOMECHANICALS) ×3 IMPLANT
TUBE CONNECTING 12'X1/4 (SUCTIONS) ×1
TUBE CONNECTING 12X1/4 (SUCTIONS) ×2 IMPLANT
TUBING FILTER THERMOFLATOR (ELECTROSURGICAL) ×3 IMPLANT
YANKAUER SUCT BULB TIP NO VENT (SUCTIONS) ×3 IMPLANT

## 2013-10-15 NOTE — Op Note (Signed)
LAP ASSISTED  LOOP COLOSTOMY  Procedure Note  Grant [name] 10/09/2013 - 10/15/2013   Pre-op Diagnosis: Rectal Cancer     Post-op Diagnosis: same  Procedure(s): LAP ASSISTED  LOOP COLOSTOMY  Surgeon(s): Harl Bowie, MD  Anesthesia: General  Staff:  Circulator: Tomma Rakers Sipsis, RN Scrub Person: Leslie Andrea, CST; Montel Culver, RN Circulator Assistant: Shelda Jakes, RN  Estimated Blood Loss: Minimal                         Harl Bowie   Date: 10/15/2013  Time: 10:15 AM

## 2013-10-15 NOTE — Progress Notes (Addendum)
Patient ID: Grant [name], male   DOB: June 09, 1955, 58 y.o.   MRN: [id number] [facility] PROGRESS NOTE  Grant [name] SPQ:[id number] DOB: 20-Aug-1955 DOA: 10/09/2013 PCP: No PCP Per Patient  Brief narrative: 58 year old male with no significant past medical history who presented to [facility] ED 10/09/2013 with reports of intermittent diarrhea, significant weight loss of about 20 pounds in last month, unintentional. He did have intentional 150 pound weight loss over past few years. In ED, patient had CT abdomen and pelvis and chest and was found to have large mass in the rectosigmoid colon concerning for locally invasive colorectal cancer likely with contained perforation. There was also gas seen within the urinary bladder concerning for colovesical fistula. Small bilateral lower lobe pulmonary nodules were also seen. Pt underwent colonoscopy and right PCN placement with biopsy on 529/2015. Patient was started on TPN 10/14/2013 for nutritional support. Plan is for diverting colostomy 10/15/2013.   Assessment and plan:   Principal problem:  Locally advanced rectal cancer and possible contained perforation  Pt underwent colonoscopy on 10/11/2013. Patient was seen and evaluated by surgery and they recommend diverting colostomy 10/15/2013  CEA was 3.7 WNL, CA 19-9 is 10.3 (low), CEA 125 is 7.2 (WNL), AFP is 1.7 (WNL), pre-albumin is low at 5.5.  Dr. Annamaria Boots of oncology has seen and evaluated the pt.Oncologic treatment options will be dependant on tissue pathology diagnosis and the results of pathology confirm adenocarcinoma. Continue cipro and flagyl. Active Problems:  Large pelvic mass with rectal cancer and possible bladder and right ureteral obstruction  Patient was seen and evaluated by urology. Per urology patient is not a good candidate for a cystectomy and urinary diversion with primary surgical resection of the pelvic mass. They did however recommend a right percutaneous nephrostomy which he had  done on 10/11/2013. No subsequent complications. Colovesical fistula  Per urology recommendation, continue treatment for UTI. Will followup with urology and surgery when is a good time to do repeat CT scan. E.Coli UTI  E.Coli sensitive to Cipro so we will continue Cipro. Acute blood loss anemia  Likely secondary to advanced colorectal cancer  Has received total of 4 units PRBC blood transfusion since admission; 2 units on admission and then 2 units 10/09/2013  Hemoglobin remains stable at 8.8 - 9.3 range Severe protein calorie malnutrition  Low albumin at 5.5, low albumin level at 2.1 and significant weight loss of 20 pounds in the past one month  TNA started 10/14/2013 for nutritional support. Patient is also on clear liquid diet.   DVT prophylaxis: heparin sub Q  IV access: PICC line    Code Status: full code  Family Communication: plan of care discussed with the patient at the bedside  Disposition Plan: home when stable    Consultants:  Urology (Dr. Alinda Money)  Oncology (Dr. Annamaria Boots)  Surgery Procedures:  Colonoscopy 10/11/2013  Right PCN with biopsy 10/11/2013  TPN 10/12/2013  Diverting colostomy 10/15/2013 Antibiotics:  Cipro 10/09/2013 -->  Flagyl 10/09/2013 -->  [name], MD  [facility] Pager [phone]  If 7PM-7AM, please contact night-coverage [email or web address] Password [email or web address] 10/15/2013, 11:22 AM   LOS: 6 days    HPI/Subjective: No acute overnight events.  Objective: Filed Vitals:   10/15/13 1035 10/15/13 1049 10/15/13 1105 10/15/13 1118  BP: 161/86 146/68 143/66 150/86  Pulse: 71 85 81 83  Temp: 97.8 F (36.6 C)     TempSrc:      Resp: 11 15 11 17   Height:      Weight:  SpO2: 100% 100% 100% 100%    Intake/Output Summary (Last 24 hours) at 10/15/13 1122 Last data filed at 10/15/13 1014  Gross per 24 hour  Intake 4175.17 ml  Output   5000 ml  Net -824.83 ml    Exam:  General: no acute distress Cardiovascular: Regular rate and rhythm,  S1/S2 appreciated  Respiratory: clear to auscultation bilaterally, no wheezing  Abdomen: PCN in place; abdomen non tender, (+) BS Extremities: No edema, pulses DP and PT palpable bilaterally  Neuro: Grossly nonfocal  Data Reviewed: Basic Metabolic Panel:  Recent Labs Lab 10/09/13 1331 10/09/13 2138 10/10/13 0345 10/13/13 0600 10/14/13 0500  NA 130* 131* 132* 136* 137  K 4.5 4.1 3.9 3.5* 4.1  CL 88* 94* 94* 101 101  CO2 27 24 25 23 26   GLUCOSE 200* 206* 102* 103* 117*  BUN 20 18 16 6  5*  CREATININE 1.28 1.14 1.14 0.98 0.94  CALCIUM 9.3 8.7 8.6 8.2* 8.4  MG  --  1.9  --  2.1 2.0  PHOS  --  3.0  --  2.8 3.2   Liver Function Tests:  Recent Labs Lab 10/09/13 1331 10/09/13 2138 10/10/13 0345 10/13/13 0600 10/14/13 0500  AST 11 13 13 11 11   ALT 8 10 8 9 8   ALKPHOS 97 90 81 58 55  BILITOT 0.4 0.4 0.3 0.4 0.3  PROT 7.1 6.4 6.2 5.7* 5.6*  ALBUMIN 2.5* 2.2* 2.1* 2.1* 2.0*    Recent Labs Lab 10/09/13 1331  LIPASE 10*   No results found for this basename: AMMONIA,  in the last 168 hours CBC:  Recent Labs Lab 10/09/13 1331  10/11/13 0901 10/12/13 0415 10/13/13 0600 10/14/13 0500 10/15/13 0440  WBC 12.8*  < > 6.5 7.7 7.4 6.2 6.5  NEUTROABS 12.0*  --   --   --  6.2 4.7  --   HGB 7.3*  < > 6.8* 8.9* 8.8* 8.9* 9.3*  HCT 24.7*  < > 21.9* 28.2* 28.4* 28.6* 30.4*  MCV 62.7*  < > 66.2* 69.5* 69.8* 70.4* 71.0*  PLT 479*  < > 387 381 369 390 408*  < > = values in this interval not displayed. Cardiac Enzymes:  Recent Labs Lab 10/09/13 1331  TROPONINI <0.30   BNP: No components found with this basename: POCBNP,  CBG:  Recent Labs Lab 10/14/13 0952 10/14/13 1613 10/14/13 2330 10/15/13 0448 10/15/13 0742  GLUCAP 157* 126* 180* 131* 131*    Recent Results (from the past 240 hour(s))  URINE CULTURE     Status: None   Collection Time    10/09/13  5:22 PM      Result Value Ref Range Status   Specimen Description URINE, CLEAN CATCH   Final   Special  Requests ADDED [id number]   Final   Culture  Setup Time     Final   Value: 10/09/2013 22:53     Performed at [facility]     Final   Value: >=100,000 COLONIES/ML     Performed at [facility]   Culture     Final   Value: ESCHERICHIA COLI     Performed at [facility]   Report Status 10/12/2013 FINAL   Final   Organism ID, Bacteria ESCHERICHIA COLI   Final  SURGICAL PCR SCREEN     Status: None   Collection Time    10/14/13  1:40 PM      Result Value Ref Range Status  MRSA, PCR NEGATIVE  NEGATIVE Final   Staphylococcus aureus NEGATIVE  NEGATIVE Final   Comment:            The Xpert SA Assay (FDA     approved for NASAL specimens     in patients over 3 years of age),     is one component of     a comprehensive surveillance     program.  Test performance has     been validated by [employer] for patients greater     than or equal to 61 year old.     It is not intended     to diagnose infection nor to     guide or monitor treatment.     Studies: No results found.  Scheduled Meds: . [facility] HOLD] ciprofloxacin  400 mg Intravenous Q12H  . [MAR HOLD] feeding supplement (RESOURCE BREEZE)  1 Container Oral TID WC  . [MAR HOLD] heparin  5,000 Units Subcutaneous 3 times per day  . HYDROmorphone      . HYDROmorphone      . [MAR HOLD] insulin aspart  0-9 Units Subcutaneous Q6H  . [facility] HOLD] metronidazole  500 mg Intravenous Q8H  . [facility] HOLD] peg 3350 powder  0.5 kit Oral Once  . [MAR HOLD] sodium chloride  10-40 mL Intracatheter Q12H  . [facility] HOLD] sodium chloride  3 mL Intravenous Q12H   Continuous Infusions: . sodium chloride 10 mL/hr at 10/14/13 1857  . [MAR HOLD] .TPN (CLINIMIX-E) Adult 75 mL/hr at 10/14/13 2159   And  . [facility] HOLD] fat emulsion 250 mL (10/14/13 2159)  . [MAR HOLD] .TPN (CLINIMIX-E) Adult     And  . [MAR HOLD] fat emulsion    . lactated ringers 50 mL/hr at 10/15/13 [phone]

## 2013-10-15 NOTE — Anesthesia Preprocedure Evaluation (Addendum)
Anesthesia Evaluation  Patient identified by MRN, date of birth, ID band Patient awake    Reviewed: Allergy & Precautions, H&P , NPO status , Patient's Chart, lab work & pertinent test results  Airway Mallampati: II TM Distance: >3 FB     Dental  (+) Chipped, Dental Advisory Given, Poor Dentition   Pulmonary former smoker,  breath sounds clear to auscultation        Cardiovascular negative cardio ROS      Neuro/Psych    GI/Hepatic negative GI ROS, Neg liver ROS,   Endo/Other  negative endocrine ROS  Renal/GU negative Renal ROS     Musculoskeletal   Abdominal Normal abdominal exam  (+)   Peds  Hematology  (+) anemia ,   Anesthesia Other Findings   Reproductive/Obstetrics                          Anesthesia Physical Anesthesia Plan  ASA: III  Anesthesia Plan: General   Post-op Pain Management:    Induction: Intravenous  Airway Management Planned: Oral ETT  Additional Equipment:   Intra-op Plan:   Post-operative Plan: Extubation in OR  Informed Consent: I have reviewed the patients History and Physical, chart, labs and discussed the procedure including the risks, benefits and alternatives for the proposed anesthesia with the patient or authorized representative who has indicated his/her understanding and acceptance.   Dental advisory given  Plan Discussed with: Anesthesiologist  Anesthesia Plan Comments:         Anesthesia Quick Evaluation

## 2013-10-15 NOTE — Transfer of Care (Signed)
Immediate Anesthesia Transfer of Care Note  Patient: Grant [name]  Procedure(s) Performed: Procedure(s): LAP ASSISTED  LOOP COLOSTOMY (N/A)  Patient Location: PACU  Anesthesia Type:General  Level of Consciousness: patient cooperative and responds to stimulation  Airway & Oxygen Therapy: Patient Spontanous Breathing and Patient connected to nasal cannula oxygen  Post-op Assessment: Report given to PACU RN and Post -op Vital signs reviewed and stable  Post vital signs: Reviewed and stable  Complications: No apparent anesthesia complications

## 2013-10-15 NOTE — Progress Notes (Signed)
Called Dr.Hodierne for sign out  

## 2013-10-15 NOTE — Progress Notes (Signed)
Patient to O.R. Via bed. 

## 2013-10-15 NOTE — Progress Notes (Signed)
PARENTERAL NUTRITION CONSULT NOTE - FOLLOW UP  Pharmacy Consult for TNA Indication: Severe malnutrition in setting of bowel obstruction due to colon cancer  Allergies  Allergen Reactions  . Strawberry Anaphylaxis and Hives  . Sunflower Oil Anaphylaxis and Hives    seeds  . Watermelon [Citrullus Vulgaris] Anaphylaxis and Hives    Patient Measurements: Height: _0  (198.1 cm) Weight: 150 lb 14.4 oz (68.448 kg) IBW/kg (Calculated) : 91.4  Vital Signs: Temp: 97.7 F (36.5 C) (06/02 0446) Temp src: Oral (06/02 0446) BP: 131/82 mmHg (06/02 0446) Pulse Rate: 78 (06/02 0446) Intake/Output from previous day: 06/01 0701 - 06/02 0700 In: 2358 [P.O.:240; I.V.:828; IV Piggyback:300; TPN:985] Out: 6000 [Urine:6000] Intake/Output from this shift:    Labs:  Recent Labs  10/13/13 0600 10/14/13 0500 10/15/13 0440  WBC 7.4 6.2 6.5  HGB 8.8* 8.9* 9.3*  HCT 28.4* 28.6* 30.4*  PLT 369 390 408*     Recent Labs  10/13/13 0600 10/14/13 0500  NA 136* 137  K 3.5* 4.1  CL 101 101  CO2 23 26  GLUCOSE 103* 117*  BUN 6 5*  CREATININE 0.98 0.94  CALCIUM 8.2* 8.4  MG 2.1 2.0  PHOS 2.8 3.2  PROT 5.7* 5.6*  ALBUMIN 2.1* 2.0*  AST 11 11  ALT 9 8  ALKPHOS 58 55  BILITOT 0.4 0.3  PREALBUMIN  --  7.7*  TRIG 96 88   Estimated Creatinine Clearance: 84.9 ml/min (by C-G formula based on Cr of 0.94).    Recent Labs  10/14/13 2330 10/15/13 0448 10/15/13 0742  GLUCAP 180* 131* 131*     Insulin Requirements in the past 24 hours:  6 units (CBGs 131-180)  Assessment: 57 YOM presented with intermittent diarrhea with 25 lb unintentional weight loss. Imaging revealed a large mass that is obstructing the colon and R ureter >> invasive colon cancer.  The patient cannot be advanced from clears and will likely be taken for surgical intervention soon.  GI: Per colonoscopy note there was a "lumen occluding large friable malignant-appearing mass in the rectum that the scope could not be  passed beyond". +Adenocarcinoma. Pre-albumin 5.5>>7.7, alb 2. May expect to see these drop again after surgery today 6/2 for diverting colostomy. Please note pt is taking CL and Resource breeze TID that must be figured in to her nutritional profile.  Endo: No hx. CBG 131-180 slightly increased with increased TPN rate overnight.  Lytes: Lytes WNL, K 4.1. Mg 2, Phos 3.2 - will monitor for refeeding syndrome  Renal: Bladder and R ureteral obstruction (from rectal mass). SCr 0.94, CrCl~80-90 ml/min  Cards: No hx. BP/HR wnl. On prn hydralazine  Hepatobil: LFTs/Tbili/Alk Phos wnl. TG 88   Neuro: No sedation, c/o R-hip pain - on prn morphine  ID: Cipro + Flagyl D#7 (5/27 >> ) for possible perforation in the setting of a colon mass. Ecoli in [place]. Afebrile, WBC wnl  Heme/Onc: Adenocarcinoma. Hgb 9.3 s/p transfusions  Best Practices: Hep SQ  TPN Access: R-double lumen PICC placed 5/31  TPN day#: 2 (5/31 >>)  Current Nutrition:  Clear liquids (25%) + Resource Breeze tidwc  Nutritional Goals:  2200-2500 kCal, 110-115 grams of protein per day  **Goal rate of Clinimix E 5/15 at 70m/hr will provide 1758 kcal and 90 grams protein per day *Lincoln National CorporationTID= 750kcal/day, 27g protein daily Total: 2508 kcal and 117g protein daily. Calculations do not include clear liquid diet consumption.  Plan:  - Continue Clinimix E 5/15 at 764mhr + lipids  at 27m/hr  - Add MV and TE daily - Reduced IVF to kvo  - BMET in AM   Darrick Greenlaw S. RAlford Highland PharmD, BHars[facility]al Center IncClinical Staff Pharmacist Pager [phone]  10/15/2013 8:02 AM

## 2013-10-16 DIAGNOSIS — G893 Neoplasm related pain (acute) (chronic): Secondary | ICD-10-CM

## 2013-10-16 DIAGNOSIS — C20 Malignant neoplasm of rectum: Secondary | ICD-10-CM

## 2013-10-16 LAB — CBC
HEMATOCRIT: 31.3 % — AB (ref 39.0–52.0)
Hemoglobin: 9.7 g/dL — ABNORMAL LOW (ref 13.0–17.0)
MCH: 22.2 pg — ABNORMAL LOW (ref 26.0–34.0)
MCHC: 31 g/dL (ref 30.0–36.0)
MCV: 71.6 fL — ABNORMAL LOW (ref 78.0–100.0)
Platelets: 398 10*3/uL (ref 150–400)
RBC: 4.37 MIL/uL (ref 4.22–5.81)
RDW: 23.9 % — AB (ref 11.5–15.5)
WBC: 6.1 10*3/uL (ref 4.0–10.5)

## 2013-10-16 LAB — GLUCOSE, CAPILLARY
GLUCOSE-CAPILLARY: 133 mg/dL — AB (ref 70–99)
Glucose-Capillary: 211 mg/dL — ABNORMAL HIGH (ref 70–99)

## 2013-10-16 MED ORDER — KETOROLAC TROMETHAMINE 15 MG/ML IJ SOLN
15.0000 mg | Freq: Four times a day (QID) | INTRAMUSCULAR | Status: DC
  Administered 2013-10-16 – 2013-10-19 (×12): 15 mg via INTRAVENOUS
  Filled 2013-10-16 (×19): qty 1

## 2013-10-16 MED ORDER — TRACE MINERALS CR-CU-F-FE-I-MN-MO-SE-ZN IV SOLN: INTRAVENOUS | Status: DC

## 2013-10-16 MED ORDER — FAT EMULSION 20 % IV EMUL: 250.0000 mL | INTRAVENOUS | Status: DC

## 2013-10-16 MED ORDER — ADULT MULTIVITAMIN W/MINERALS CH
1.0000 | ORAL_TABLET | Freq: Every day | ORAL | Status: DC
  Administered 2013-10-17 – 2013-10-20 (×4): 1 via ORAL
  Filled 2013-10-16 (×4): qty 1

## 2013-10-16 NOTE — Progress Notes (Signed)
Patient ID: Grant [name], male   DOB: 05-30-1955, 58 y.o.   MRN: [id number] 1 Day Post-Op  Subjective: Pt feels "alright, considering" this morning. Some nausea yesterday improved with Zofran. Some stomach soreness, worse with laughing this morning while talking to his nurse.  Some ostomy output, already.  Objective: Vital signs in last 24 hours: Temp:  [97.6 F (36.4 C)-98.4 F (36.9 C)] 97.8 F (36.6 C) (06/03 0419) Pulse Rate:  [71-97] 88 (06/03 0419) Resp:  [11-18] 18 (06/03 0419) BP: (126-161)/(66-93) 129/93 mmHg (06/03 0419) SpO2:  [99 %-100 %] 99 % (06/03 0419) Weight:  [151 lb (68.493 kg)] 151 lb (68.493 kg) (06/03 0500) Last BM Date: 10/14/13  Intake/Output from previous day: 06/02 0701 - 06/03 0700 In: 5476.2 [I.V.:2360.2; IV Piggyback:100; TPN:3011] Out: 5100 [Urine:5000; Stool:100] Intake/Output this shift:    PE: Gen: thin, cachectic-appearing adult male in NAD Abd: soft, NT, ND, BS active but soft   Right nephrostomy tube in place with clear yellow output  Left-sided ostomy with laparoscopic wounds clean / dry / intact  Ostomy with some thin liquid output, visible bowel wall intact Heart: RRR, no murmur appreciated Lungs: CTAB, no wheeze, normal WOB Ext: no LE edema  Lab Results:   Recent Labs  10/15/13 0440 10/16/13 0540  WBC 6.5 6.1  HGB 9.3* 9.7*  HCT 30.4* 31.3*  PLT 408* 398   BMET  Recent Labs  10/14/13 0500 10/15/13 1330  NA 137 137  K 4.1 4.4  CL 101 99  CO2 26 29  GLUCOSE 117* 129*  BUN 5* 12  CREATININE 0.94 0.94  CALCIUM 8.4 8.9   PT/INR No results found for this basename: LABPROT, INR,  in the last 72 hours CMP     Component Value Date/Time   NA 137 10/15/2013 1330   K 4.4 10/15/2013 1330   CL 99 10/15/2013 1330   CO2 29 10/15/2013 1330   GLUCOSE 129* 10/15/2013 1330   BUN 12 10/15/2013 1330   CREATININE 0.94 10/15/2013 1330   CALCIUM 8.9 10/15/2013 1330   PROT 5.6* 10/14/2013 0500   ALBUMIN 2.0* 10/14/2013 0500   AST 11 10/14/2013  0500   ALT 8 10/14/2013 0500   ALKPHOS 55 10/14/2013 0500   BILITOT 0.3 10/14/2013 0500   GFRNONAA >90 10/15/2013 1330   GFRAA >90 10/15/2013 1330   Lipase     Component Value Date/Time   LIPASE 10* 10/09/2013 1331    Studies/Results: No results found.  Anti-infectives: Anti-infectives   Start     Dose/Rate Route Frequency Ordered Stop   10/09/13 2130  ciprofloxacin (CIPRO) IVPB 400 mg     400 mg 200 mL/hr over 60 Minutes Intravenous Every 12 hours 10/09/13 2108     10/09/13 2130  metroNIDAZOLE (FLAGYL) IVPB 500 mg     500 mg 100 mL/hr over 60 Minutes Intravenous Every 8 hours 10/09/13 2109     10/09/13 2045  ciprofloxacin (CIPRO) IVPB 400 mg  Status:  Discontinued     400 mg 200 mL/hr over 60 Minutes Intravenous Every 12 hours 10/09/13 2041 10/09/13 2103   10/09/13 2045  metroNIDAZOLE (FLAGYL) IVPB 500 mg  Status:  Discontinued     500 mg 100 mL/hr over 60 Minutes Intravenous Every 8 hours 10/09/13 2042 10/09/13 2109       Assessment/Plan 1. POD#1 from diverting loop colostomy for rectal mass with obstructing right ureter and near obstruction of colon 2. Right hydronephrosis, secondary to #1, s/p Nephrostomy tube placement 3. Three pulmonary  nodules, unable to characterize 4. PCM/TNA 5. Post-op pain  Plan: 1. Start clear liquid diet, wean TNA per pharmacy 2. Stop Cipro (day 8) and Flagyl (day 7) 3. Dilaudid 1 mg IV q1 PRN, add Toradol 15 mg IV q6 PRN 4. Post-op wound / ostomy care 5. Mobilize as tolerated, continue good pulmonary toilet  LOS: 7 days   Emmaline Kluver, MD CCS General Surgery (PGY-2, [place]) 10/16/2013, 7:37 AM

## 2013-10-16 NOTE — Op Note (Signed)
NAMESHAYDON, LEASE NO.:  [id number]  MEDICAL RECORD NO.:  [id number]  LOCATION:  6N32C                        FACILITY:  [facility]  PHYSICIAN:  Coralie Keens, M.D. DATE OF BIRTH:  [birth year]  DATE OF PROCEDURE:  10/15/2013 DATE OF DISCHARGE:                              OPERATIVE REPORT   PREOPERATIVE DIAGNOSIS:  Obstructing rectal cancer.  POSTOPERATIVE DIAGNOSIS:  Obstructing rectal cancer.  PROCEDURE:  Laparoscopic-assisted descending loop colostomy.  SURGEON:  Coralie Keens, M.D.  ANESTHESIA:  General and 0.25% Marcaine.  ESTIMATED BLOOD LOSS:  Minimal.  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  He will be undergoing neoadjuvant therapy.  Decision has been made to proceed with a diverting loop colostomy.  PROCEDURE IN DETAIL:  The patient was brought to the operating room, identified as Grant [name].  He was placed supine on the operating table and general anesthesia was induced.  His abdomen was then prepped and draped in the usual sterile fashion.  I made a small incision transversely just below the umbilicus with a scalpel.  I took this down to the fascia which was then opened with the scalpel.  A hemostat was then used to pass to the peritoneal cavity under direct vision.  A 0 Vicryl pursestring suture was placed around the fascial opening.  The Hasson port was placed through the opening and insufflation of the abdomen was begun.  The patient had a redundant loop of sigmoid colon with the adhesions to the abdominal wall.  I could see where the colon was fixed to the bladder.  I made a small incision in the lower midline. I placed a 5-mm trocar under direct vision.  I then used the laparoscopic scissors to take down adhesions to the loop of sigmoid colon.  Once the adhesions were lysed, the colon lifted easily to the abdominal wall.  I then made  a small transverse incision in the patient's left lower quadrant.  I took this down to the fascia which was then opened in a cruciate fashion.  The underlying muscle fibers was then retracted.  The peritoneum was opened.  I then used a Babcock clamp to grasp the sigmoid colon.  This was then brought out at the loop colostomy.  I opened up the mesentery underneath the colon and then placed a plastic bridge through the loop of colon.  This was then secured in place with nylon sutures.  I then opened up the colon anteriorly and matured the ostomy circumferentially with interrupted 3-0 Vicryl sutures.  I then removed the ports and deflated the abdomen.  I tied the 0 Vicryl placed at the umbilicus.  I then anesthetized the incisions with Marcaine and closed at the skin with 4-0 Monocryl subcuticular sutures.  Dermabond was then applied.  The patient tolerated the procedure well.  All the counts were correct at the end of the procedure.  The patient was then extubated in the operating room and taken in stable condition to recovery room.     Coralie Keens, M.D.     DB/MEDQ  D:  10/15/2013  T:  10/16/2013  Job:  [id number] 

## 2013-10-16 NOTE — Progress Notes (Signed)
  Chief Complaint: Locally advanced Rectal adenocarcinoma  History of Present Illness:   58 year old male with recent diagnosis of locally advanced rectal adenocarcinoma. A rectal biopsy on 10/11/2013 consistent with adenocarcinoma. He had a diverting descending loop colostomy performed on 10/15/2013.  He is recovering from surgery. He has rectal pain currently managed with IV Dilaudid. His father and significant spouse Letta Kocher is present with him here today.  Meds: Current facility-administered medications:0.9 %  sodium chloride infusion, , Intravenous, Continuous, Harl Bowie, MD, Last Rate: 100 mL/hr at 10/16/13 1420;  acetaminophen (TYLENOL) suppository 650 mg, 650 mg, Rectal, Q6H PRN, [name], MD;  acetaminophen (TYLENOL) tablet 650 mg, 650 mg, Oral, Q6H PRN, [name], MD;  ALPRAZolam Duanne Moron) tablet 0.5 mg, 0.5 mg, Oral, TID PRN, [name], MD, 0.5 mg at 10/16/13 3614 feeding supplement (RESOURCE BREEZE) (RESOURCE BREEZE) liquid 1 Container, 1 Container, Oral, TID WC, Baird Lyons, RD, 1 Container at 10/16/13 0736;  heparin injection 5,000 Units, 5,000 Units, Subcutaneous, 3 times per day, [name], MD, 5,000 Units at 10/16/13 1357;  hydrALAZINE (APRESOLINE) injection 10 mg, 10 mg, Intravenous, Q4H PRN, [name], MD, 10 mg at 10/09/13 1853 HYDROmorphone (DILAUDID) injection 1 mg, 1 mg, Intravenous, Q1H PRN, Harl Bowie, MD, 1 mg at 10/16/13 1403;  ketorolac (TORADOL) 15 MG/ML injection 15 mg, 15 mg, Intravenous, 4 times per day, Sharon [address], MD, 15 mg at 10/16/13 1357;  lactated ringers infusion, , Intravenous, Continuous, Albertha Ghee, MD, Last Rate: 50 mL/hr at 10/15/13 0904 [START ON 10/17/2013] multivitamin with minerals tablet 1 tablet, 1 tablet, Oral, Daily, Crystal Stillinger Robertson, RPH;  ondansetron [facility]) injection 4 mg, 4 mg, Intravenous, Q6H PRN, [name], MD, 4 mg at 10/15/13 0245;  ondansetron (ZOFRAN) tablet 4 mg, 4 mg,  Oral, Q6H PRN, [name], MD;  peg 3350 powder (MOVIPREP) kit 100 g, 0.5 kit, Oral, Once, Lauren Bajbus, RPH sodium chloride 0.9 % injection 10-40 mL, 10-40 mL, Intracatheter, Q12H, [name], MD, 10 mL at 10/14/13 0534;  sodium chloride 0.9 % injection 10-40 mL, 10-40 mL, Intracatheter, PRN, [name], MD, 10 mL at 10/16/13 0541;  sodium chloride 0.9 % injection 3 mL, 3 mL, Intravenous, Q12H, [name], MD, 3 mL at 10/14/13 1132   IMPRESSION/REPORT/PLAN 1. Locally advanced rectal adenocarcinoma, possible contained perforation, status post diverting loop colostomy on 10/15/2013 I had a prolonged discussion with the patient, his significant spouse Ms. Davis Gourd, and his father about his diagnosis. Patient was discussed at multidisciplinary cancer meeting this morning, and treatment options include initial chemotherapy to further shrink the tumor prior to any further surgical intervention.  An outpatient appointment with Dr. Benay Spice, GI Oncology is scheduled tentatively for 11/08/2013 at 1:30 PM at the [facility].  Patient asked when he would be released from the hospital and I informed him that typically after abdominal surgery, a patient remains in the hospital until his IV pain medications are switched over to oral pain control, his bowels are functioning normally among others. Of course, he will need teaching on how to manage his ostomy.  The [facility] phone number was given to the patient and family if he had any further questions. Patient gives permission for [place] to contact his significant spouse Letta Kocher for any health communications.  Multiple questions answered.  Total time 30 minutes all discussion and counseling.  Dr. Earmon Phoenix, MD

## 2013-10-16 NOTE — Progress Notes (Signed)
NUTRITION FOLLOW UP/NEW TPN  Intervention:   - Diet advancement per MD -Add Boost Plus TID when diet advanced - Provided " Colostomy Nutrition Therapy" handout from the Academy of Nutrition and Dietetics - RD to continue to monitor   Nutrition Dx:   Unintentional weight loss related to diarrhea and rectal cancer as evidenced by >8% weight loss in less than one month - ongoing   Goal:   -Pt to meet >/= 90% of their estimated nutrition needs - being met  -Weight gain - unmet, wt has dropped additional 5 lbs since admission   Monitor:   Weights, labs, TPN, intake, diet advancement  Assessment:   58 y.o. male who presented after complaining of non-bloody diarrhea and cloudy urine with air. He has also noticed a decrease in his appetite and 25 lb weight loss x 1 month. CT was performed which revealed a large mass in the rectosigmoid colon concerning for locally invasive colorectal cancer, likely with contained perforation. This appears to encase/ involve the mid to distal right ureter with right hydronephrosis/obstruction. IR received request for image guided right percutaneous nephrostomy tube.   Pt is now 1 day s/p surgery for diverting loop colostomy. Pt was receiving TPN of Clinimix E 5/15 at 69m/hr to provide 1758 kcal and 90 grams protein per day. Per pharmacy note TPN plus RB TID to provide 2508 kcal and 117 grams protein. Per order history, pt has not been taking RB TID. Per pharmacy plan to decrease TPN for the rest of today with plans to d/c further TPN after today.  Pt reports he has been drinking some [employer] He states his appetite is still excellent and he is ready to eat. Pt requesting information regarding what to eat. Provided "Colostomy Nutrition Therapy" handout from the Academy of Nutrition and Dietetics. Discussed that pt should be able to return to eating a normal diet after 4-6 weeks.    Height: Ht Readings from Last 1 Encounters:  10/09/13 6' 6"  (1.981 m)     Weight Status:   Wt Readings from Last 1 Encounters:  10/16/13 151 lb (68.493 kg)    Re-estimated needs:  Kcal: 2200-2500  Protein: 100-115 grams  Fluid: 2.3 L/day   Skin: closed abdominal incision   Diet Order: Clear Liquid   Intake/Output Summary (Last 24 hours) at 10/16/13 1357 Last data filed at 10/16/13 0931  Gross per 24 hour  Intake   4559 ml  Output   4525 ml  Net     34 ml    Last BM: 6/1   Labs:   Recent Labs Lab 10/09/13 2138  10/13/13 0600 10/14/13 0500 10/15/13 1330  NA 131*  < > 136* 137 137  K 4.1  < > 3.5* 4.1 4.4  CL 94*  < > 101 101 99  CO2 24  < > 23 26 29   BUN 18  < > 6 5* 12  CREATININE 1.14  < > 0.98 0.94 0.94  CALCIUM 8.7  < > 8.2* 8.4 8.9  MG 1.9  --  2.1 2.0  --   PHOS 3.0  --  2.8 3.2  --   GLUCOSE 206*  < > 103* 117* 129*  < > = values in this interval not displayed.  CBG (last 3)   Recent Labs  10/15/13 2141 10/16/13 0430 10/16/13 0944  GLUCAP 150* 133* 211*    Scheduled Meds: . feeding supplement (RESOURCE BREEZE)  1 Container Oral TID WC  . heparin  5,000  Units Subcutaneous 3 times per day  . ketorolac  15 mg Intravenous 4 times per day  . [START ON 10/17/2013] multivitamin with minerals  1 tablet Oral Daily  . peg 3350 powder  0.5 kit Oral Once  . sodium chloride  10-40 mL Intracatheter Q12H  . sodium chloride  3 mL Intravenous Q12H    Continuous Infusions: . sodium chloride 100 mL/hr at 10/15/13 2338  . lactated ringers 50 mL/hr at 10/15/13 Bell Arthur RD, LDN Inpatient Clinical Dietitian Pager: [phone] After Hours Pager: [phone]

## 2013-10-16 NOTE — Progress Notes (Addendum)
PARENTERAL NUTRITION CONSULT NOTE - FOLLOW UP  Pharmacy Consult for TNA Indication: Severe malnutrition in setting of bowel obstruction due to colon cancer  Allergies  Allergen Reactions  . Strawberry Anaphylaxis and Hives  . Sunflower Oil Anaphylaxis and Hives    seeds  . Watermelon [Citrullus Vulgaris] Anaphylaxis and Hives    Patient Measurements: Height: 6' 6"  (198.1 cm) Weight: 151 lb (68.493 kg) IBW/kg (Calculated) : 91.4  Vital Signs: Temp: 97.8 F (36.6 C) (06/03 0419) Temp src: Oral (06/03 0419) BP: 129/93 mmHg (06/03 0419) Pulse Rate: 88 (06/03 0419) Intake/Output from previous day: 06/02 0701 - 06/03 0700 In: 5476.2 [I.V.:2360.2; IV Piggyback:100; TPN:3011] Out: 5100 [Urine:5000; Stool:100] Intake/Output from this shift:    Labs:  Recent Labs  10/14/13 0500 10/15/13 0440 10/16/13 0540  WBC 6.2 6.5 6.1  HGB 8.9* 9.3* 9.7*  HCT 28.6* 30.4* 31.3*  PLT 390 408* 398     Recent Labs  10/14/13 0500 10/15/13 1330  NA 137 137  K 4.1 4.4  CL 101 99  CO2 26 29  GLUCOSE 117* 129*  BUN 5* 12  CREATININE 0.94 0.94  CALCIUM 8.4 8.9  MG 2.0  --   PHOS 3.2  --   PROT 5.6*  --   ALBUMIN 2.0*  --   AST 11  --   ALT 8  --   ALKPHOS 55  --   BILITOT 0.3  --   PREALBUMIN 7.7*  --   TRIG 88  --    Estimated Creatinine Clearance: 85 ml/min (by C-G formula based on Cr of 0.94).    Recent Labs  10/15/13 1546 10/15/13 2141 10/16/13 0430  GLUCAP 135* 150* 133*     Insulin Requirements in the past 24 hours:  3 units (CBGs 131-161)  Assessment: 57 YOM presented with intermittent diarrhea with 25 lb unintentional weight loss. Imaging revealed a large mass that is obstructing the colon and R ureter >> invasive colon cancer.   GI: Per colonoscopy note there was a "lumen occluding large friable malignant-appearing mass in the rectum that the scope could not be passed beyond". +Adenocarcinoma. Pre-albumin 5.5>>7.7, alb 2. May expect to see these drop  again after surgery 6/2 for loop colostomy. Please note pt is taking CL and Resource breeze TID that must be figured in to her nutritional profile. 6/3: Resuming CL diet with orders to "wean" TPN. +ostomy output already today.  Endo: No hx. CBG 131-161 slightly increased with higher TPN rate overnight.  Lytes: Lytes WNL, K 4.4. Mg 2, Phos 3.2 ok.  Renal: Bladder and R ureteral obstruction (from rectal mass) s/p nephrostomy tube. SCr 0.94, CrCl~80-90 ml/min  Cards: No hx. BP 129/93 this AM with HR 71-97. On prn hydralazine  Hepatobil: LFTs/Tbili/Alk Phos wnl. TG 88   Neuro: c/o R-hip pain - on prn morphine  ID: Cipro + Flagyl completed  (5/27 >> 6/2) for possible perforation in the setting of a colon mass. Ecoli in [place]. Afebrile, WBC wnl  Heme/Onc: Adenocarcinoma. Hgb 9.7 s/p transfusions  Best Practices: Hep SQ  TPN Access: R-double lumen PICC placed 5/31  TPN day#: 3 (5/31 >>)  Current Nutrition:  Clear liquids (25%) + Resource Breeze tidwc  Nutritional Goals:  2200-2500 kCal, 110-115 grams of protein per day  **Goal rate of Clinimix E 5/15 at 40m/hr will provide 1758 kcal and 90 grams protein per day *Lincoln National CorporationTID= 750kcal/day, 27g protein daily Total: 2508 kcal and 117g protein daily.  **Calculations do not include clear  liquid diet consumption.  Plan:  - Decrease Clinimix E 5/15 to 63m/hr + lipids at 170mhr for the rest of today. - MD d/c'd further TPN after today.   Jamaia Brum S. RoAlford HighlandPharmD, BCGlastonbury Surgery Centerlinical Staff Pharmacist Pager [phone] 10/16/2013 8:01 AM

## 2013-10-16 NOTE — Progress Notes (Signed)
Subjective: Patient states he is doing well s/p colostomy yesterday, he would like some additional pain medication secondary to discomfort at surgical site with movement. He denies any right PCN pain or bleeding.   Objective: Physical Exam: BP 145/76  Pulse 87  Temp(Src) 98.1 F (36.7 C) (Oral)  Resp 18  Ht 6\' 6"  (1.981 m)  Wt 151 lb (68.493 kg)  BMI 17.45 kg/m2  SpO2 99%  General: A&Ox3, NAD, sitting up in bed Abd: Left colostomy intact with thin liquid output Right PCN dressing C/D/I, NT, no signs of bleeding or hematoma, light clear urine output, 24 hr recorded output 3,150 cc (600 cc today)   Labs: CBC  Recent Labs  10/15/13 0440 10/16/13 0540  WBC 6.5 6.1  HGB 9.3* 9.7*  HCT 30.4* 31.3*  PLT 408* 398   BMET  Recent Labs  10/14/13 0500 10/15/13 1330  NA 137 137  K 4.1 4.4  CL 101 99  CO2 26 29  GLUCOSE 117* 129*  BUN 5* 12  CREATININE 0.94 0.94  CALCIUM 8.4 8.9   LFT  Recent Labs  10/14/13 0500  PROT 5.6*  ALBUMIN 2.0*  AST 11  ALT 8  ALKPHOS 55  BILITOT 0.3   PT/INR No results found for this basename: LABPROT, INR,  in the last 72 hours   Studies/Results: No results found.  Assessment/Plan: Large colorectal mass, POD #1 diverting loop colostomy Right hydronephrosis secondary to #1 s/p right PCN with good output, renal function stable. Continue drain flushes and monitor output. IR will follow.    LOS: 7 days    Hedy Jacob PA-C 10/16/2013 11:07 AM

## 2013-10-16 NOTE — Progress Notes (Signed)
I have seen and examined the patient and agree with the assessment and plans.  Avian Konigsberg A. Presly Steinruck  MD, FACS  

## 2013-10-16 NOTE — Anesthesia Postprocedure Evaluation (Signed)
Anesthesia Post Note  Patient: Grant [name]  Procedure(s) Performed: Procedure(s) (LRB): LAP ASSISTED  LOOP COLOSTOMY (N/A)  Anesthesia type: General  Patient location: PACU  Post pain: Pain level controlled and Adequate analgesia  Post assessment: Post-op Vital signs reviewed, Patient's Cardiovascular Status Stable, Respiratory Function Stable, Patent Airway and Pain level controlled  Last Vitals:  Filed Vitals:   10/16/13 0419  BP: 129/93  Pulse: 88  Temp: 36.6 C  Resp: 18    Post vital signs: Reviewed and stable  Level of consciousness: awake, alert  and oriented  Complications: No apparent anesthesia complications

## 2013-10-16 NOTE — Progress Notes (Signed)
[facility] PROGRESS NOTE  Grant [name] MVE:[id number] DOB: [birth year] DOA: 10/09/2013 PCP: No PCP Per Patient  Brief narrative:  58 year old male with no significant past medical history who presented to [facility] ED 10/09/2013 with reports of intermittent diarrhea, significant weight loss of about 20 pounds in last month, unintentional. He did have intentional 150 pound weight loss over past few years. In ED, patient had CT abdomen and pelvis and chest and was found to have large mass in the rectosigmoid colon concerning for locally invasive colorectal cancer likely with contained perforation. There was also gas seen within the urinary bladder concerning for colovesical fistula. Small bilateral lower lobe pulmonary nodules were also seen. Pt underwent colonoscopy and right PCN placement with biopsy on 529/2015. Patient was started on TPN 10/14/2013 for nutritional support. diverting colostomy done on  10/15/2013.   Assessment and plan:  Principal problem:  Locally advanced rectal cancer and possible contained perforation  Pt underwent colonoscopy on 10/11/2013. Patient was seen and evaluated by surgery and  recommend diverting colostomy which was done on 6/2 Pain control with dilaudid 1 mg q1hr prn and toradol.  CEA was 3.7 WNL, CA 19-9 is 10.3 (low), CEA 125 is 7.2 (WNL), AFP is 1.7 (WNL), pre-albumin is low at 5.5.  Dr. Annamaria Boots of oncology has seen and evaluated the pt. Rectal bx shows adenocarcinoma. Will follow with onc as outpt Off abx today ( completed 7 days)   Active Problems:  Large pelvic mass with rectal cancer and possible bladder and right ureteral obstruction  Patient was seen and evaluated by urology. Per urology patient is not a good candidate for a cystectomy and urinary diversion with primary surgical resection of the pelvic mass. recommended a right percutaneous nephrostomy which he had done on 10/11/2013.  tolerated procedure well and has good UOP.  Colovesical fistula   Per urology recommendation, continue treatment for UTI. Will followup with urology and surgery   E.Coli UTI  E.Coli sensitive to Cipro . completed 7 days of cipro and flagyl already.  Acute blood loss anemia  Likely secondary to advanced colorectal cancer  Has received total of 4 units PRBC blood transfusion since admission;  Hemoglobin remains stable at present   Severe protein calorie malnutrition  significant weight loss of 20 pounds in the past one month . Low albumin and prealbuminTNA started 06/01 for nutritional support.  also on clear liquid diet and supplements.   Code Status: full code Family Communication: cousin at bedside Disposition Plan: home once pain improved   Consultants:  CCS  IR  Oncology   urology  Procedures:  Right percutaneous nephrostomy  Diversion colostomy  Antibiotics:    HPI/Subjective: Patient seen and examined this am. abdominal pain stable on current pain regimen.   Objective: Filed Vitals:   10/16/13 1039  BP: 145/76  Pulse: 87  Temp: 98.1 F (36.7 C)  Resp: 18    Intake/Output Summary (Last 24 hours) at 10/16/13 1424 Last data filed at 10/16/13 1400  Gross per 24 hour  Intake   4759 ml  Output   5525 ml  Net   -766 ml   Filed Weights   10/14/13 0550 10/15/13 0457 10/16/13 0500  Weight: 70.761 kg (156 lb) 68.448 kg (150 lb 14.4 oz) 68.493 kg (151 lb)    Exam:   General:  Middle aged male in NAD  HEENT: no pallor, moist mucosa  Cardiovascular: NS1&S2, no murmurs, rubs or gallop  Chest: clear b/l, no added sounds  ABD: soft, ND, ,  colostomy in place, right percutaneous nephrostomy tube in place  Ext: Warm, no edema   CNS: AAOX3   Data Reviewed: Basic Metabolic Panel:  Recent Labs Lab 10/09/13 2138 10/10/13 0345 10/13/13 0600 10/14/13 0500 10/15/13 1330  NA 131* 132* 136* 137 137  K 4.1 3.9 3.5* 4.1 4.4  CL 94* 94* 101 101 99  CO2 24 25 23 26 29   GLUCOSE 206* 102* 103* 117* 129*  BUN 18  16 6  5* 12  CREATININE 1.14 1.14 0.98 0.94 0.94  CALCIUM 8.7 8.6 8.2* 8.4 8.9  MG 1.9  --  2.1 2.0  --   PHOS 3.0  --  2.8 3.2  --    Liver Function Tests:  Recent Labs Lab 10/09/13 2138 10/10/13 0345 10/13/13 0600 10/14/13 0500  AST 13 13 11 11   ALT 10 8 9 8   ALKPHOS 90 81 58 55  BILITOT 0.4 0.3 0.4 0.3  PROT 6.4 6.2 5.7* 5.6*  ALBUMIN 2.2* 2.1* 2.1* 2.0*   No results found for this basename: LIPASE, AMYLASE,  in the last 168 hours No results found for this basename: AMMONIA,  in the last 168 hours CBC:  Recent Labs Lab 10/12/13 0415 10/13/13 0600 10/14/13 0500 10/15/13 0440 10/16/13 0540  WBC 7.7 7.4 6.2 6.5 6.1  NEUTROABS  --  6.2 4.7  --   --   HGB 8.9* 8.8* 8.9* 9.3* 9.7*  HCT 28.2* 28.4* 28.6* 30.4* 31.3*  MCV 69.5* 69.8* 70.4* 71.0* 71.6*  PLT 381 369 390 408* 398   Cardiac Enzymes: No results found for this basename: CKTOTAL, CKMB, CKMBINDEX, TROPONINI,  in the last 168 hours BNP (last 3 results) No results found for this basename: PROBNP,  in the last 8760 hours CBG:  Recent Labs Lab 10/15/13 1212 10/15/13 1546 10/15/13 2141 10/16/13 0430 10/16/13 0944  GLUCAP 161* 135* 150* 133* 211*    Recent Results (from the past 240 hour(s))  URINE CULTURE     Status: None   Collection Time    10/09/13  5:22 PM      Result Value Ref Range Status   Specimen Description URINE, CLEAN CATCH   Final   Special Requests ADDED [id number]   Final   Culture  Setup Time     Final   Value: 10/09/2013 22:53     Performed at [place]     Final   Value: >=100,000 COLONIES/ML     Performed at [facility]   Culture     Final   Value: ESCHERICHIA COLI     Performed at [facility]   Report Status 10/12/2013 FINAL   Final   Organism ID, Bacteria ESCHERICHIA COLI   Final  SURGICAL PCR SCREEN     Status: None   Collection Time    10/14/13  1:40 PM      Result Value Ref Range Status   MRSA, PCR NEGATIVE  NEGATIVE Final    Staphylococcus aureus NEGATIVE  NEGATIVE Final   Comment:            The Xpert SA Assay (FDA     approved for NASAL specimens     in patients over 89 years of age),     is one component of     a comprehensive surveillance     program.  Test performance has     been validated by [employer] for patients greater  than or equal to 4 year old.     It is not intended     to diagnose infection nor to     guide or monitor treatment.     Studies: No results found.  Scheduled Meds: . feeding supplement (RESOURCE BREEZE)  1 Container Oral TID WC  . heparin  5,000 Units Subcutaneous 3 times per day  . ketorolac  15 mg Intravenous 4 times per day  . [START ON 10/17/2013] multivitamin with minerals  1 tablet Oral Daily  . peg 3350 powder  0.5 kit Oral Once  . sodium chloride  10-40 mL Intracatheter Q12H  . sodium chloride  3 mL Intravenous Q12H   Continuous Infusions: . sodium chloride 100 mL/hr at 10/16/13 1420  . lactated ringers 50 mL/hr at 10/15/13 1537      Time spent: 25 minutes    Grant [name]  [facility] Pager [phone] If 7PM-7AM, please contact night-coverage at [email or web address], password [place] 10/16/2013, 2:24 PM  LOS: 7 days

## 2013-10-17 LAB — CBC
HCT: 30.1 % — ABNORMAL LOW (ref 39.0–52.0)
Hemoglobin: 9.1 g/dL — ABNORMAL LOW (ref 13.0–17.0)
MCH: 21.8 pg — AB (ref 26.0–34.0)
MCHC: 30.2 g/dL (ref 30.0–36.0)
MCV: 72 fL — AB (ref 78.0–100.0)
PLATELETS: 381 10*3/uL (ref 150–400)
RBC: 4.18 MIL/uL — AB (ref 4.22–5.81)
RDW: 24.4 % — AB (ref 11.5–15.5)
WBC: 5.3 10*3/uL (ref 4.0–10.5)

## 2013-10-17 LAB — BASIC METABOLIC PANEL
BUN: 12 mg/dL (ref 6–23)
CALCIUM: 8.6 mg/dL (ref 8.4–10.5)
CO2: 26 meq/L (ref 19–32)
CREATININE: 0.97 mg/dL (ref 0.50–1.35)
Chloride: 104 mEq/L (ref 96–112)
GFR calc Af Amer: >90 mL/min (ref >90)
Glucose, Bld: 91 mg/dL (ref 70–99)
Potassium: 4.4 mEq/L (ref 3.7–5.3)
SODIUM: 137 meq/L (ref 137–147)

## 2013-10-17 MED ORDER — OXYCODONE-ACETAMINOPHEN 5-325 MG PO TABS
1.0000 | ORAL_TABLET | ORAL | Status: DC | PRN
  Administered 2013-10-17 – 2013-10-20 (×17): 2 via ORAL
  Filled 2013-10-17 (×17): qty 2

## 2013-10-17 MED ORDER — BOOST PLUS PO LIQD
237.0000 mL | Freq: Three times a day (TID) | ORAL | Status: DC
  Administered 2013-10-17 – 2013-10-20 (×8): 237 mL via ORAL
  Filled 2013-10-17 (×7): qty 237
  Filled 2013-10-17: qty 1
  Filled 2013-10-17 (×5): qty 237
  Filled 2013-10-17: qty 1

## 2013-10-17 MED ORDER — HYDROMORPHONE HCL PF 1 MG/ML IJ SOLN
0.5000 mg | INTRAMUSCULAR | Status: DC | PRN
  Administered 2013-10-17 – 2013-10-18 (×3): 1 mg via INTRAVENOUS
  Filled 2013-10-17 (×3): qty 1

## 2013-10-17 NOTE — Progress Notes (Signed)
[facility] PROGRESS NOTE  Grant [name] ZOX:[id number] DOB: 07/17/1955 DOA: 10/09/2013 PCP: No PCP Per Patient  Brief narrative:  58 year old male with no significant past medical history who presented to [facility] ED 10/09/2013 with reports of intermittent diarrhea, significant weight loss of about 20 pounds in last month, unintentional. He did have intentional 150 pound weight loss over past few years. In ED, patient had CT abdomen and pelvis and chest and was found to have large mass in the rectosigmoid colon concerning for locally invasive colorectal cancer likely with contained perforation. There was also gas seen within the urinary bladder concerning for colovesical fistula. Small bilateral lower lobe pulmonary nodules were also seen. Pt underwent colonoscopy and right PCN placement with biopsy on 529/2015. Patient was started on TPN 10/14/2013 for nutritional support. diverting colostomy done on  10/15/2013.   Assessment and plan:  Principal problem:  Locally advanced rectal cancer and possible contained perforation  Pt underwent colonoscopy on 10/11/2013. Patient was seen and evaluated by surgery and  recommend diverting colostomy which was done on 6/2. Tolerated sx well. Passing gas today.  -has severe pain. Dilaudid dose spaced out to q2hr prn and added percocet. CEA was 3.7 WNL, CA 19-9 is 10.3 (low), CEA 125 is 7.2 (WNL), AFP is 1.7 (WNL), pre-albumin is low at 5.5.  Dr. Annamaria Boots of oncology has seen and evaluated the pt. Rectal bx shows adenocarcinoma.Plan on chemotherapy as oupt. Has appt with Dr Benay Spice on 11/08/13 at 1:30 pm. Off abx  ( completed 7 days)   Active Problems:  Large pelvic mass with rectal cancer and possible bladder and right ureteral obstruction  Patient was seen and evaluated by urology. Per urology patient is not a good candidate for a cystectomy and urinary diversion with primary surgical resection of the pelvic mass. recommended a right percutaneous nephrostomy  which he had done on 10/11/2013.  tolerated procedure well and has good UOP.  Colovesical fistula  Per urology recommendation, continue treatment for UTI. Will followup with urology and surgery . Will need subsequent antegrade right ureteral stent placement.   E.Coli UTI  E.Coli sensitive to Cipro . completed 7 days of cipro and flagyl already.  Acute blood loss anemia  Likely secondary to advanced colorectal cancer  Has received total of 4 units PRBC blood transfusion since admission;  Hemoglobin remains stable at present   Severe protein calorie malnutrition  significant weight loss of 20 pounds in the past one month . Low albumin and prealbuminTNA started 06/01 for nutritional support.  Advanced diet to full liquids. Continue supplements.   Code Status: full code Family Communication: cousin at bedside Disposition Plan: home once pain improved   Consultants:  CCS  IR  Oncology   urology  Procedures:  Right percutaneous nephrostomy  Diversion colostomy  Antibiotics:    HPI/Subjective: Patient seen and examined this am. abdominal pain stable on current pain regimen.   Objective: Filed Vitals:   10/17/13 0515  BP: 132/78  Pulse: 88  Temp: 97.7 F (36.5 C)  Resp: 18    Intake/Output Summary (Last 24 hours) at 10/17/13 1236 Last data filed at 10/17/13 1050  Gross per 24 hour  Intake   5279 ml  Output   3340 ml  Net   1939 ml   Filed Weights   10/15/13 0457 10/16/13 0500 10/17/13 0515  Weight: 68.448 kg (150 lb 14.4 oz) 68.493 kg (151 lb) 67.5 kg (148 lb 13 oz)    Exam:   General:  Middle aged  male in NAD  HEENT: no pallor, moist mucosa  Cardiovascular: NS1&S2, no murmurs, rubs or gallop  Chest: clear b/l, no added sounds  ABD: soft, ND, BS +, colostomy in place, right percutaneous nephrostomy tube in place  Ext: Warm, no edema   CNS: AAOX3   Data Reviewed: Basic Metabolic Panel:  Recent Labs Lab 10/13/13 0600 10/14/13 0500  10/15/13 1330 10/17/13 0440  NA 136* 137 137 137  K 3.5* 4.1 4.4 4.4  CL 101 101 99 104  CO2 _0 GLUCOSE 103* 117* 129* 91  BUN 6 5* 12 12  CREATININE 0.98 0.94 0.94 0.97  CALCIUM 8.2* 8.4 8.9 8.6  MG 2.1 2.0  --   --   PHOS 2.8 3.2  --   --    Liver Function Tests:  Recent Labs Lab 10/13/13 0600 10/14/13 0500  AST 11 11  ALT 9 8  ALKPHOS 58 55  BILITOT 0.4 0.3  PROT 5.7* 5.6*  ALBUMIN 2.1* 2.0*   No results found for this basename: LIPASE, AMYLASE,  in the last 168 hours No results found for this basename: AMMONIA,  in the last 168 hours CBC:  Recent Labs Lab 10/13/13 0600 10/14/13 0500 10/15/13 0440 10/16/13 0540 10/17/13 0440  WBC 7.4 6.2 6.5 6.1 5.3  NEUTROABS 6.2 4.7  --   --   --   HGB 8.8* 8.9* 9.3* 9.7* 9.1*  HCT 28.4* 28.6* 30.4* 31.3* 30.1*  MCV 69.8* 70.4* 71.0* 71.6* 72.0*  PLT 369 390 408* 398 381   Cardiac Enzymes: No results found for this basename: CKTOTAL, CKMB, CKMBINDEX, TROPONINI,  in the last 168 hours BNP (last 3 results) No results found for this basename: PROBNP,  in the last 8760 hours CBG:  Recent Labs Lab 10/15/13 1212 10/15/13 1546 10/15/13 2141 10/16/13 0430 10/16/13 0944  GLUCAP 161* 135* 150* 133* 211*    Recent Results (from the past 240 hour(s))  URINE CULTURE     Status: None   Collection Time    10/09/13  5:22 PM      Result Value Ref Range Status   Specimen Description URINE, CLEAN CATCH   Final   Special Requests ADDED [id number]   Final   Culture  Setup Time     Final   Value: 10/09/2013 22:53     Performed at [place]     Final   Value: >=100,000 COLONIES/ML     Performed at [facility]   Culture     Final   Value: ESCHERICHIA COLI     Performed at [facility]   Report Status 10/12/2013 FINAL   Final   Organism ID, Bacteria ESCHERICHIA COLI   Final  SURGICAL PCR SCREEN     Status: None   Collection Time    10/14/13  1:40 PM      Result Value  Ref Range Status   MRSA, PCR NEGATIVE  NEGATIVE Final   Staphylococcus aureus NEGATIVE  NEGATIVE Final   Comment:            The Xpert SA Assay (FDA     approved for NASAL specimens     in patients over 29 years of age),     is one component of     a comprehensive surveillance     program.  Test performance has     been validated by [employer] for patients greater  than or equal to 54 year old.     It is not intended     to diagnose infection nor to     guide or monitor treatment.     Studies: No results found.  Scheduled Meds: . feeding supplement (RESOURCE BREEZE)  1 Container Oral TID WC  . heparin  5,000 Units Subcutaneous 3 times per day  . ketorolac  15 mg Intravenous 4 times per day  . multivitamin with minerals  1 tablet Oral Daily  . peg 3350 powder  0.5 kit Oral Once  . sodium chloride  10-40 mL Intracatheter Q12H  . sodium chloride  3 mL Intravenous Q12H   Continuous Infusions: . sodium chloride 100 mL/hr at 10/17/13 1038  . lactated ringers 50 mL/hr at 10/15/13 8902      Time spent: 25 minutes    Matias Thurman  [facility] Pager [phone] If 7PM-7AM, please contact night-coverage at [email or web address], password [facility] 10/17/2013, 12:36 PM  LOS: 8 days

## 2013-10-17 NOTE — Progress Notes (Addendum)
NUTRITION FOLLOW UP  DOCUMENTATION CODES  Per approved criteria   -Severe malnutrition in the context of chronic illness  -Underweight   Pt meets criteria for SEVERE MALNUTRITION in the context of CHRONIC ILLNESS as evidenced by 8% weight loss in less than one month, severe muscle wasting, and severe fat wasting.  Intervention:    D/C Resource Breeze  Boost Plus TID   Nutrition Dx:   Unintentional weight loss related to diarrhea and rectal cancer as evidenced by >8% weight loss in less than one month - ongoing   Goal:   -Pt to meet >/= 90% of their estimated nutrition needs - being met  -Weight gain - unmet, wt has dropped additional 5 lbs since admission  Monitor:   Weights, labs, intake, diet advancement  Assessment:   58 y.o. male who presented after complaining of non-bloody diarrhea and cloudy urine with air. He has also noticed a decrease in his appetite and 25 lb weight loss x 1 month. CT was performed which revealed a large mass in the rectosigmoid colon concerning for locally invasive colorectal cancer, likely with contained perforation.   Pt is now s/p surgery for diverting loop colostomy. Pt was receiving TPN of Clinimix E 5/15 at 54m/hr to provide 1758 kcal and 90 grams protein per day. TPN started 5/31 and discontinued 6/3. Spoke at length with pt and family. Reviewed protein sources, pt with multiple questions about foods, answered questions. Pt dislikes BGwyneth Revelsbut really likes Boost Plus.   Height: Ht Readings from Last 1 Encounters:  10/09/13 6' 6"  (1.981 m)    Weight Status:   Wt Readings from Last 1 Encounters:  10/17/13 148 lb 13 oz (67.5 kg)  Admission weight: 156 lb (70.7 kg) 5/27  Re-estimated needs:  Kcal: 2200-2500  Protein: 100-115 grams  Fluid: 2.3 L/day   Skin: closed abdominal incision   Diet Order: Full Liquid Meal Completion: 50-80%   Intake/Output Summary (Last 24 hours) at 10/17/13 1435 Last data filed at 10/17/13 1246  Gross per  24 hour  Intake   5079 ml  Output   2790 ml  Net   2289 ml    Last BM: 6/4   Labs:   Recent Labs Lab 10/13/13 0600 10/14/13 0500 10/15/13 1330 10/17/13 0440  NA 136* 137 137 137  K 3.5* 4.1 4.4 4.4  CL 101 101 99 104  CO2 23 26 29 26   BUN 6 5* 12 12  CREATININE 0.98 0.94 0.94 0.97  CALCIUM 8.2* 8.4 8.9 8.6  MG 2.1 2.0  --   --   PHOS 2.8 3.2  --   --   GLUCOSE 103* 117* 129* 91    CBG (last 3)   Recent Labs  10/15/13 2141 10/16/13 0430 10/16/13 0944  GLUCAP 150* 133* 211*    Scheduled Meds: . feeding supplement (RESOURCE BREEZE)  1 Container Oral TID WC  . heparin  5,000 Units Subcutaneous 3 times per day  . ketorolac  15 mg Intravenous 4 times per day  . multivitamin with minerals  1 tablet Oral Daily  . peg 3350 powder  0.5 kit Oral Once  . sodium chloride  10-40 mL Intracatheter Q12H  . sodium chloride  3 mL Intravenous Q12H    Continuous Infusions: . sodium chloride 100 mL/hr at 10/17/13 [place] LDN, CNSC [phone] [phone] Hours Pager

## 2013-10-17 NOTE — Progress Notes (Signed)
I have seen and examined the patient and agree with the assessment and plans.  Dekari Bures A. Larayah Clute  MD, FACS  

## 2013-10-17 NOTE — Progress Notes (Addendum)
Patient ID: Grant [name], male   DOB: 12-22-1955, 58 y.o.   MRN: [id number] 2 Days Post-Op  Subjective: Pt feels all right this morning. Pain meds IV are helping, but pain is still present. Tolerated clear liquids yesterday, no nausea or vomiting. Would like to try advancing diet but also wants to "be careful and take things slow." Ambulating well.  Objective: Vital signs in last 24 hours: Temp:  [97.6 F (36.4 C)-98.1 F (36.7 C)] 97.7 F (36.5 C) (06/04 0515) Pulse Rate:  [81-88] 88 (06/04 0515) Resp:  [18] 18 (06/04 0515) BP: (127-145)/(70-90) 132/78 mmHg (06/04 0515) SpO2:  [99 %-100 %] 100 % (06/04 0515) Weight:  [148 lb 13 oz (67.5 kg)] 148 lb 13 oz (67.5 kg) (06/04 0515) Last BM Date: 10/14/13  Intake/Output from previous day: 06/03 0701 - 06/04 0700 In: 5599 [P.O.:2597; I.V.:2392; TPN:610] Out: 4270 [Urine:4200; Stool:70] Intake/Output this shift:    PE: Gen: thin, cachectic-appearing adult male in NAD Abd: soft, appropriately tender, ND, BS active but soft   Right nephrostomy tube in place with clear yellow output  Left-sided ostomy with laparoscopic wounds clean / dry / intact  Ostomy with gas and small amount liquid output, visible bowel wall intact Heart: RRR, no murmur appreciated Lungs: CTAB, no wheeze, normal WOB Ext: no LE edema  Lab Results:   Recent Labs  10/16/13 0540 10/17/13 0440  WBC 6.1 5.3  HGB 9.7* 9.1*  HCT 31.3* 30.1*  PLT 398 381   BMET  Recent Labs  10/15/13 1330 10/17/13 0440  NA 137 137  K 4.4 4.4  CL 99 104  CO2 29 26  GLUCOSE 129* 91  BUN 12 12  CREATININE 0.94 0.97  CALCIUM 8.9 8.6   PT/INR No results found for this basename: LABPROT, INR,  in the last 72 hours CMP     Component Value Date/Time   NA 137 10/17/2013 0440   K 4.4 10/17/2013 0440   CL 104 10/17/2013 0440   CO2 26 10/17/2013 0440   GLUCOSE 91 10/17/2013 0440   BUN 12 10/17/2013 0440   CREATININE 0.97 10/17/2013 0440   CALCIUM 8.6 10/17/2013 0440   PROT 5.6*  10/14/2013 0500   ALBUMIN 2.0* 10/14/2013 0500   AST 11 10/14/2013 0500   ALT 8 10/14/2013 0500   ALKPHOS 55 10/14/2013 0500   BILITOT 0.3 10/14/2013 0500   GFRNONAA >90 10/17/2013 0440   GFRAA >90 10/17/2013 0440   Lipase     Component Value Date/Time   LIPASE 10* 10/09/2013 1331    Studies/Results: No results found.  Anti-infectives: Anti-infectives   Start     Dose/Rate Route Frequency Ordered Stop   10/09/13 2130  ciprofloxacin (CIPRO) IVPB 400 mg  Status:  Discontinued     400 mg 200 mL/hr over 60 Minutes Intravenous Every 12 hours 10/09/13 2108 10/16/13 0750   10/09/13 2130  metroNIDAZOLE (FLAGYL) IVPB 500 mg  Status:  Discontinued     500 mg 100 mL/hr over 60 Minutes Intravenous Every 8 hours 10/09/13 2109 10/16/13 0750   10/09/13 2045  ciprofloxacin (CIPRO) IVPB 400 mg  Status:  Discontinued     400 mg 200 mL/hr over 60 Minutes Intravenous Every 12 hours 10/09/13 2041 10/09/13 2103   10/09/13 2045  metroNIDAZOLE (FLAGYL) IVPB 500 mg  Status:  Discontinued     500 mg 100 mL/hr over 60 Minutes Intravenous Every 8 hours 10/09/13 2042 10/09/13 2109       Assessment/Plan 1. POD#2 from diverting loop  colostomy for rectal mass with obstructing right ureter and near obstruction of colon 2. Right hydronephrosis, secondary to #1, s/p Nephrostomy tube placement 3. Three pulmonary nodules, unable to characterize 4. Post-op pain 5. S/p Cipro x8 days, Flagyl x7 days  Plan: 1. Advance diet to full liquids, as tolerated 3. Adjust pain control - drop Dilaudid to 0.5-1 mg IV q2 PRN, keep Toradol 15 mg IV q6 PRN, add Percocet 5-325 mg 1-2 tablets q4 PRN PO 4. Post-op wound / ostomy care 5. Mobilize as tolerated, continue good pulmonary toilet  LOS: 8 days   Emmaline Kluver, MD CCS General Surgery (PGY-2, [place]) 10/17/2013, 7:42 AM

## 2013-10-17 NOTE — Progress Notes (Signed)
2 Days Post-Op  Subjective: Rt PCN placed 5/29 Output good Now post op colostomy placement  Objective: Vital signs in last 24 hours: Temp:  [97.6 F (36.4 C)-98 F (36.7 C)] 97.7 F (36.5 C) (06/04 0515) Pulse Rate:  [81-88] 88 (06/04 0515) Resp:  [18] 18 (06/04 0515) BP: (127-145)/(70-90) 132/78 mmHg (06/04 0515) SpO2:  [100 %] 100 % (06/04 0515) Weight:  [67.5 kg (148 lb 13 oz)] 67.5 kg (148 lb 13 oz) (06/04 0515) Last BM Date: 10/17/13  Intake/Output from previous day: 06/03 0701 - 06/04 0700 In: 5599 [P.O.:2597; I.V.:2392; TPN:610] Out: 4270 [Urine:4200; Stool:70] Intake/Output this shift: Total I/O In: 480 [P.O.:480] Out: 270 [Urine:250; Stool:20]  PE:  Afeb; vss PCN intact; site clean and dry Output 2.4L yesterday; 250 cc today Renal fxn stable; wbc wnl   Lab Results:   Recent Labs  10/16/13 0540 10/17/13 0440  WBC 6.1 5.3  HGB 9.7* 9.1*  HCT 31.3* 30.1*  PLT 398 381   BMET  Recent Labs  10/15/13 1330 10/17/13 0440  NA 137 137  K 4.4 4.4  CL 99 104  CO2 29 26  GLUCOSE 129* 91  BUN 12 12  CREATININE 0.94 0.97  CALCIUM 8.9 8.6   PT/INR No results found for this basename: LABPROT, INR,  in the last 72 hours ABG No results found for this basename: PHART, PCO2, PO2, HCO3,  in the last 72 hours  Studies/Results: No results found.  Anti-infectives: Anti-infectives   Start     Dose/Rate Route Frequency Ordered Stop   10/09/13 2130  ciprofloxacin (CIPRO) IVPB 400 mg  Status:  Discontinued     400 mg 200 mL/hr over 60 Minutes Intravenous Every 12 hours 10/09/13 2108 10/16/13 0750   10/09/13 2130  metroNIDAZOLE (FLAGYL) IVPB 500 mg  Status:  Discontinued     500 mg 100 mL/hr over 60 Minutes Intravenous Every 8 hours 10/09/13 2109 10/16/13 0750   10/09/13 2045  ciprofloxacin (CIPRO) IVPB 400 mg  Status:  Discontinued     400 mg 200 mL/hr over 60 Minutes Intravenous Every 12 hours 10/09/13 2041 10/09/13 2103   10/09/13 2045  metroNIDAZOLE  (FLAGYL) IVPB 500 mg  Status:  Discontinued     500 mg 100 mL/hr over 60 Minutes Intravenous Every 8 hours 10/09/13 2042 10/09/13 2109      Assessment/Plan: s/p Procedure(s): LAP ASSISTED  LOOP COLOSTOMY (N/A)  Rt PCN placed in IR 5/29 Doing well Colorectal Ca; colostomy in OR 2 days ago Plan per CCS and Onc   LOS: 8 days    Lavonia Drafts 10/17/2013

## 2013-10-17 NOTE — Progress Notes (Signed)
Patient ID: Grant [name], male   DOB: 18-Dec-1955, 58 y.o.   MRN: [id number]  2 Days Post-Op Subjective: Pt s/p diverting loop colostomy 2 days ago.  He is recovering well.  He has seen Dr. Terrill Mohr with Medical Oncology and is being scheduled for neoadjuvant chemotherapy and radiation under the care of Dr. Benay Spice with plans to proceed with subsequent surgical resection of his rectosigmoid mass (confirmed colorectal adenocarcinoma). Pt denies any further pneumaturia or cloudy urine following antibiotic therapy for UTI.  Objective: Vital signs in last 24 hours: Temp:  [97.6 F (36.4 C)-97.8 F (36.6 C)] 97.8 F (36.6 C) (06/04 1318) Pulse Rate:  [80-88] 80 (06/04 1318) Resp:  [17-18] 17 (06/04 1318) BP: (127-140)/(70-78) 140/78 mmHg (06/04 1318) SpO2:  [100 %] 100 % (06/04 1318) Weight:  [67.5 kg (148 lb 13 oz)] 67.5 kg (148 lb 13 oz) (06/04 0515)  Intake/Output from previous day: 06/03 0701 - 06/04 0700 In: 8250 [P.O.:2597; I.V.:2392; TPN:610] Out: 4270 [Urine:4200; Stool:70] Intake/Output this shift:    Physical Exam:  General: Alert and oriented  Lab Results:  Recent Labs  10/15/13 0440 10/16/13 0540 10/17/13 0440  HGB 9.3* 9.7* 9.1*  HCT 30.4* 31.3* 30.1*   BMET  Recent Labs  10/15/13 1330 10/17/13 0440  NA 137 137  K 4.4 4.4  CL 99 104  CO2 29 26  GLUCOSE 129* 91  BUN 12 12  CREATININE 0.94 0.97  CALCIUM 8.9 8.6     Studies/Results: No results found.  Assessment/Plan: 1) Locally advanced colorectal adenocarcinoma: I agree with plan to proceed with neoadjuvant therapy to hopefully decrease size of mass and to increase chance of bladder and ureteral preservation.  Will need to coordinate surgery and preoperative planning with general surgery if he proceeds with definitive surgery at [facility].  2) Probable colovesical fistula: I would recommend a CT cystogram at this time to evaluate for fistula.  3) Right ureteral obstruction: R PCN draining well.   Following CT cystogram, I will make recommendations about possible antegrade stent placement by IR which may be best performed prior to discharge to facilitate ease of care upon discharge.    LOS: 8 days   Raynelle Bring 10/17/2013, 7:12 PM

## 2013-10-17 NOTE — Consult Note (Addendum)
WOC ostomy consult note Reason for Consult: Consult requested for colostomy surgery performed 6/2.   Stoma type/location: Colostomy to LLQ Stomal assessment/size: Stoma red and viable, above skin level, swollen and 2 inches with rod intact. Peristomal assessment: Rod is creating a difficult pouching situation Output  50cc pink liquid.  No stool or flatus at this time. Ostomy pouching: 2pc.  Education provided:  Demonstrated pouch change with 2 piece pouch.  Pt prefers to use one piece pouch when he is discharged since it is easier to apply.  He is able to open and close velcro to empty. Discussed pouching routines, ordering supplies, and emptying. Supplies at bedside for staff use. Placed on [place] discharge program. Julien Girt MSN, RN, [place], [place], [place]

## 2013-10-18 ENCOUNTER — Inpatient Hospital Stay (HOSPITAL_COMMUNITY): Payer: Medicaid Other

## 2013-10-18 ENCOUNTER — Encounter (HOSPITAL_COMMUNITY): Payer: Self-pay | Admitting: Surgery

## 2013-10-18 HISTORY — PX: OTHER SURGICAL HISTORY: SHX169

## 2013-10-18 LAB — CBC
HCT: 30.2 % — ABNORMAL LOW (ref 39.0–52.0)
Hemoglobin: 9 g/dL — ABNORMAL LOW (ref 13.0–17.0)
MCH: 21.7 pg — ABNORMAL LOW (ref 26.0–34.0)
MCHC: 29.8 g/dL — ABNORMAL LOW (ref 30.0–36.0)
MCV: 72.9 fL — ABNORMAL LOW (ref 78.0–100.0)
PLATELETS: 320 10*3/uL (ref 150–400)
RBC: 4.14 MIL/uL — ABNORMAL LOW (ref 4.22–5.81)
RDW: 24.3 % — ABNORMAL HIGH (ref 11.5–15.5)
WBC: 5.2 10*3/uL (ref 4.0–10.5)

## 2013-10-18 MED ORDER — FENTANYL CITRATE 0.05 MG/ML IJ SOLN
INTRAMUSCULAR | Status: AC | PRN
  Administered 2013-10-18: 25 ug via INTRAVENOUS
  Administered 2013-10-18: 50 ug via INTRAVENOUS
  Administered 2013-10-18: 25 ug via INTRAVENOUS

## 2013-10-18 MED ORDER — SODIUM CHLORIDE 0.9 % IJ SOLN
10.0000 mL | INTRAMUSCULAR | Status: DC | PRN
  Administered 2013-10-18 – 2013-10-19 (×2): 10 mL

## 2013-10-18 MED ORDER — HEPARIN SODIUM (PORCINE) 5000 UNIT/ML IJ SOLN
5000.0000 [IU] | Freq: Three times a day (TID) | INTRAMUSCULAR | Status: DC
  Administered 2013-10-18 – 2013-10-20 (×6): 5000 [IU] via SUBCUTANEOUS
  Filled 2013-10-18 (×6): qty 1

## 2013-10-18 MED ORDER — ONDANSETRON HCL 4 MG/2ML IJ SOLN
INTRAMUSCULAR | Status: AC
  Filled 2013-10-18: qty 2

## 2013-10-18 MED ORDER — FENTANYL CITRATE 0.05 MG/ML IJ SOLN
INTRAMUSCULAR | Status: AC
  Filled 2013-10-18: qty 2

## 2013-10-18 MED ORDER — MIDAZOLAM HCL 2 MG/2ML IJ SOLN
INTRAMUSCULAR | Status: AC | PRN
  Administered 2013-10-18: 1 mg via INTRAVENOUS
  Administered 2013-10-18 (×2): 0.5 mg via INTRAVENOUS

## 2013-10-18 MED ORDER — ONDANSETRON HCL 4 MG/2ML IJ SOLN
INTRAMUSCULAR | Status: AC | PRN
  Administered 2013-10-18: 4 mg via INTRAVENOUS

## 2013-10-18 MED ORDER — IOHEXOL 300 MG/ML  SOLN
50.0000 mL | Freq: Once | INTRAMUSCULAR | Status: AC | PRN
  Administered 2013-10-18: 25 mL via INTRAVENOUS

## 2013-10-18 MED ORDER — CIPROFLOXACIN IN D5W 400 MG/200ML IV SOLN
400.0000 mg | INTRAVENOUS | Status: AC
  Administered 2013-10-18: 400 mg via INTRAVENOUS
  Filled 2013-10-18: qty 200

## 2013-10-18 MED ORDER — MIDAZOLAM HCL 2 MG/2ML IJ SOLN
INTRAMUSCULAR | Status: AC
  Filled 2013-10-18: qty 4

## 2013-10-18 NOTE — Progress Notes (Signed)
[facility] PROGRESS NOTE  Oshen Wlodarczyk NWG:[id number] DOB: [birth year] DOA: 10/09/2013 PCP: No PCP Per Patient  Brief narrative:  58 year old male with no significant past medical history who presented to [facility] ED 10/09/2013 with reports of intermittent diarrhea, significant weight loss of about 20 pounds in last month, unintentional. He did have intentional 150 pound weight loss over past few years. In ED, patient had CT abdomen and pelvis and chest and was found to have large mass in the rectosigmoid colon concerning for locally invasive colorectal cancer likely with contained perforation. There was also gas seen within the urinary bladder concerning for colovesical fistula. Small bilateral lower lobe pulmonary nodules were also seen. Pt underwent colonoscopy and right PCN placement with biopsy on 529/2015. Patient was started on TPN 10/14/2013 for nutritional support. diverting colostomy done on  10/15/2013.   Assessment and plan:  Principal problem:  Locally advanced rectal cancer and possible contained perforation  Pt underwent colonoscopy on 10/11/2013. Patient was seen and evaluated by surgery and  recommend diverting colostomy which was done on 6/2. Tolerated sx well. Passing gas today.  -Pain stable  CEA was 3.7 WNL, CA 19-9 is 10.3 (low), CEA 125 is 7.2 (WNL), AFP is 1.7 (WNL), pre-albumin is low at 5.5.  Dr. Annamaria Boots of oncology has seen and evaluated the pt. Rectal bx shows adenocarcinoma.Plan on chemotherapy as oupt. Has appt with Dr Benay Spice on 11/08/13 at 1:30 pm. Off abx  ( completed 7 days)   Active Problems:  Large pelvic mass with rectal cancer and possible bladder and right ureteral obstruction  Patient was seen and evaluated by urology. Per urology patient is not a good candidate for a cystectomy and urinary diversion with primary surgical resection of the pelvic mass. recommended a right percutaneous nephrostomy which he had done on 10/11/2013.  tolerated procedure well and  has good UOP.  Colovesical fistula  CT pelvis with cystogram done yesterday shows no colovesical fistula. antegrade right ureteral stent to be placed by IR today.   E.Coli UTI  E.Coli sensitive to Cipro . completed 7 days of cipro and flagyl already.  Acute blood loss anemia  Likely secondary to advanced colorectal cancer  Has received total of 4 units PRBC blood transfusion since admission.  Hemoglobin remains stable at present   Severe protein calorie malnutrition  significant weight loss of 20 pounds in the past one month . Low albumin and prealbumin. Received TNA for nutritional support.  Advanced diet to full liquids. ( NPO for IR procedure today)  Continue supplements.   Code Status: full code Family Communication: none at bedside Disposition Plan: home likely in 24-48 hrs   Consultants:  [place]  IR  Oncology   urology  Procedures:  Right percutaneous nephrostomy  Diversion colostomy  Antibiotics:    HPI/Subjective: Patient seen and examined this am. abdominal pain stable on oral  pain regimen.   Objective: Filed Vitals:   10/18/13 0514  BP: 140/85  Pulse: 78  Temp: 97.8 F (36.6 C)  Resp: 16    Intake/Output Summary (Last 24 hours) at 10/18/13 1352 Last data filed at 10/18/13 1327  Gross per 24 hour  Intake   3701 ml  Output   5275 ml  Net  -1574 ml   Filed Weights   10/16/13 0500 10/17/13 0515 10/18/13 0514  Weight: 68.493 kg (151 lb) 67.5 kg (148 lb 13 oz) 71.668 kg (158 lb)    Exam:   General:  Middle aged male in NAD  HEENT: no  pallor, moist mucosa  Cardiovascular: NS1&S2, no murmurs, rubs or gallop  Chest: clear b/l, no added sounds  ABD: soft, ND, BS +, colostomy in place, right percutaneous nephrostomy tube in place  Ext: Warm, no edema   CNS: AAOX3   Data Reviewed: Basic Metabolic Panel:  Recent Labs Lab 10/13/13 0600 10/14/13 0500 10/15/13 1330 10/17/13 0440  NA 136* 137 137 137  K 3.5* 4.1 4.4 4.4  CL 101  101 99 104  CO2 23 26 29 26   GLUCOSE 103* 117* 129* 91  BUN 6 5* 12 12  CREATININE 0.98 0.94 0.94 0.97  CALCIUM 8.2* 8.4 8.9 8.6  MG 2.1 2.0  --   --   PHOS 2.8 3.2  --   --    Liver Function Tests:  Recent Labs Lab 10/13/13 0600 10/14/13 0500  AST 11 11  ALT 9 8  ALKPHOS 58 55  BILITOT 0.4 0.3  PROT 5.7* 5.6*  ALBUMIN 2.1* 2.0*   No results found for this basename: LIPASE, AMYLASE,  in the last 168 hours No results found for this basename: AMMONIA,  in the last 168 hours CBC:  Recent Labs Lab 10/13/13 0600 10/14/13 0500 10/15/13 0440 10/16/13 0540 10/17/13 0440 10/18/13 0528  WBC 7.4 6.2 6.5 6.1 5.3 5.2  NEUTROABS 6.2 4.7  --   --   --   --   HGB 8.8* 8.9* 9.3* 9.7* 9.1* 9.0*  HCT 28.4* 28.6* 30.4* 31.3* 30.1* 30.2*  MCV 69.8* 70.4* 71.0* 71.6* 72.0* 72.9*  PLT 369 390 408* 398 381 320   Cardiac Enzymes: No results found for this basename: CKTOTAL, CKMB, CKMBINDEX, TROPONINI,  in the last 168 hours BNP (last 3 results) No results found for this basename: PROBNP,  in the last 8760 hours CBG:  Recent Labs Lab 10/15/13 1212 10/15/13 1546 10/15/13 2141 10/16/13 0430 10/16/13 0944  GLUCAP 161* 135* 150* 133* 211*    Recent Results (from the past 240 hour(s))  URINE CULTURE     Status: None   Collection Time    10/09/13  5:22 PM      Result Value Ref Range Status   Specimen Description URINE, CLEAN CATCH   Final   Special Requests ADDED [id number]   Final   Culture  Setup Time     Final   Value: 10/09/2013 22:53     Performed at [facility]     Final   Value: >=100,000 COLONIES/ML     Performed at [facility]   Culture     Final   Value: ESCHERICHIA COLI     Performed at [facility]   Report Status 10/12/2013 FINAL   Final   Organism ID, Bacteria ESCHERICHIA COLI   Final  SURGICAL PCR SCREEN     Status: None   Collection Time    10/14/13  1:40 PM      Result Value Ref Range Status   MRSA, PCR NEGATIVE   NEGATIVE Final   Staphylococcus aureus NEGATIVE  NEGATIVE Final   Comment:            The Xpert SA Assay (FDA     approved for NASAL specimens     in patients over 2 years of age),     is one component of     a comprehensive surveillance     program.  Test performance has     been validated by [employer]  for patients greater     than or equal to 73 year old.     It is not intended     to diagnose infection nor to     guide or monitor treatment.     Studies: Ct Pelvis Wo Contrast  10/18/2013   CLINICAL DATA:  Cystogram to evaluate for colovesicular fistula.  EXAM: CT PELVIS WITHOUT CONTRAST  TECHNIQUE: Multidetector CT imaging of the pelvis was performed following the standard protocol without intravenous contrast. Prior to imaging, 300 cc of Isovue-300 was injected into the bladder.  COMPARISON:  Abdominal CT 10/09/2013  FINDINGS: 300 cc of iodinated contrast was injected into the bladder. There is good distention of the urinary bladder. No contrast is seen exiting the bladder into the perivesicular space or colon. High-density stool within the anus is likely residual from comparison CT, which used oral contrast. Undulating luminal contour of the posterior bladder wall is likely mucosal redundancy as there was no clear mural invasion on the preceding enhanced CT. A large colorectal adenocarcinoma has a grossly stable appearance. No development of fluid collection.  New extraluminal gas in the left lower quadrant related to diverting colostomy.  Moderate bilateral hip osteoarthritis. Lower lumbar facet osteoarthritis causing advanced spinal canal stenosis at L4-5.  IMPRESSION: No colovesicular fistula identified.   Electronically Signed   By: Jorje Guild M.D.   On: 10/18/2013 02:23    Scheduled Meds: . ciprofloxacin  400 mg Intravenous On Call  . heparin  5,000 Units Subcutaneous 3 times per day  . ketorolac  15 mg Intravenous 4 times per day  . lactose free nutrition  237 mL Oral  TID WC  . multivitamin with minerals  1 tablet Oral Daily  . peg 3350 powder  0.5 kit Oral Once  . sodium chloride  10-40 mL Intracatheter Q12H   Continuous Infusions: . sodium chloride 100 mL/hr at 10/18/13 0543      Time spent: 25 minutes    Kismet Facemire  [facility] Pager [phone] If 7PM-7AM, please contact night-coverage at [email or web address], password [facility] 10/18/2013, 1:52 PM  LOS: 9 days

## 2013-10-18 NOTE — Progress Notes (Signed)
Patient ID: Grant [name], male   DOB: 1955/06/13, 58 y.o.   MRN: [id number] 3 Days Post-Op  Subjective: Pt doing well.  Pain much better controlled with percocet.  hungry  Objective: Vital signs in last 24 hours: Temp:  [97.8 F (36.6 C)-97.9 F (36.6 C)] 97.8 F (36.6 C) (06/05 0514) Pulse Rate:  [76-80] 78 (06/05 0514) Resp:  [16-17] 16 (06/05 0514) BP: (138-140)/(78-85) 140/85 mmHg (06/05 0514) SpO2:  [100 %] 100 % (06/05 0514) Weight:  [158 lb (71.668 kg)] 158 lb (71.668 kg) (06/05 0514) Last BM Date: 10/17/13  Intake/Output from previous day: 06/04 0701 - 06/05 0700 In: 3861 [P.O.:1824; I.V.:2037] Out: 5245 [Urine:4750; Stool:495] Intake/Output this shift:    PE: Abd: soft, appropriately tender, ostomy with good soft stool output and flatus, incisions c/d/i  Lab Results:   Recent Labs  10/17/13 0440 10/18/13 0528  WBC 5.3 5.2  HGB 9.1* 9.0*  HCT 30.1* 30.2*  PLT 381 320   BMET  Recent Labs  10/15/13 1330 10/17/13 0440  NA 137 137  K 4.4 4.4  CL 99 104  CO2 29 26  GLUCOSE 129* 91  BUN 12 12  CREATININE 0.94 0.97  CALCIUM 8.9 8.6   PT/INR No results found for this basename: LABPROT, INR,  in the last 72 hours CMP     Component Value Date/Time   NA 137 10/17/2013 0440   K 4.4 10/17/2013 0440   CL 104 10/17/2013 0440   CO2 26 10/17/2013 0440   GLUCOSE 91 10/17/2013 0440   BUN 12 10/17/2013 0440   CREATININE 0.97 10/17/2013 0440   CALCIUM 8.6 10/17/2013 0440   PROT 5.6* 10/14/2013 0500   ALBUMIN 2.0* 10/14/2013 0500   AST 11 10/14/2013 0500   ALT 8 10/14/2013 0500   ALKPHOS 55 10/14/2013 0500   BILITOT 0.3 10/14/2013 0500   GFRNONAA >90 10/17/2013 0440   GFRAA >90 10/17/2013 0440   Lipase     Component Value Date/Time   LIPASE 10* 10/09/2013 1331       Studies/Results: Ct Pelvis Wo Contrast  10/18/2013   CLINICAL DATA:  Cystogram to evaluate for colovesicular fistula.  EXAM: CT PELVIS WITHOUT CONTRAST  TECHNIQUE: Multidetector CT imaging of the pelvis was  performed following the standard protocol without intravenous contrast. Prior to imaging, 300 cc of Isovue-300 was injected into the bladder.  COMPARISON:  Abdominal CT 10/09/2013  FINDINGS: 300 cc of iodinated contrast was injected into the bladder. There is good distention of the urinary bladder. No contrast is seen exiting the bladder into the perivesicular space or colon. High-density stool within the anus is likely residual from comparison CT, which used oral contrast. Undulating luminal contour of the posterior bladder wall is likely mucosal redundancy as there was no clear mural invasion on the preceding enhanced CT. A large colorectal adenocarcinoma has a grossly stable appearance. No development of fluid collection.  New extraluminal gas in the left lower quadrant related to diverting colostomy.  Moderate bilateral hip osteoarthritis. Lower lumbar facet osteoarthritis causing advanced spinal canal stenosis at L4-5.  IMPRESSION: No colovesicular fistula identified.   Electronically Signed   By: Jorje Guild M.D.   On: 10/18/2013 02:23    Anti-infectives: Anti-infectives   Start     Dose/Rate Route Frequency Ordered Stop   10/09/13 2130  ciprofloxacin (CIPRO) IVPB 400 mg  Status:  Discontinued     400 mg 200 mL/hr over 60 Minutes Intravenous Every 12 hours 10/09/13 2108 10/16/13 0750  10/09/13 2130  metroNIDAZOLE (FLAGYL) IVPB 500 mg  Status:  Discontinued     500 mg 100 mL/hr over 60 Minutes Intravenous Every 8 hours 10/09/13 2109 10/16/13 0750   10/09/13 2045  ciprofloxacin (CIPRO) IVPB 400 mg  Status:  Discontinued     400 mg 200 mL/hr over 60 Minutes Intravenous Every 12 hours 10/09/13 2041 10/09/13 2103   10/09/13 2045  metroNIDAZOLE (FLAGYL) IVPB 500 mg  Status:  Discontinued     500 mg 100 mL/hr over 60 Minutes Intravenous Every 8 hours 10/09/13 2042 10/09/13 2109       Assessment/Plan  1. POD 3 s/p laparoscopic loop colostomy for large pelvic mass  Plan: 1. Advance to  regular diet 2. [place] for home from our standpoint once medically stable.  LOS: 9 days    Henreitta Cea 10/18/2013, 8:51 AM Pager: [phone]

## 2013-10-18 NOTE — Progress Notes (Signed)
Patient ID: Rachid Parham, male   DOB: 22-Sep-1955, 58 y.o.   MRN: [id number]  I reviewed CT cystogram.  There is no definite colovesical fistula noted.  While there is still suspicion considering patient's history of pneumaturia, UTI and air seen in bladder on initial imaging, it may be that he had a very small fistula which is healing after bowel diversion or possibly a fistula between the right ureteral and colon.  He will need further evaluation eventually prior to definitive surgical therapy of his pelvic mass with cystoscopy but this can be delayed until surgery is planned. I would recommend completion of antibiotic therapy of his recent UTI with no further acute evaluation needed.    It would also be appropriate to see if IR can place an antegrade right ureteral stent to minimize the complexity of his care upon discharge.  He will need outpatient urologic follow up.  If General Surgery plans to proceed with his surgical resection here in [place], I would ask them to contact me to discuss plans and timing of that surgery so that I can appropriately evaluate patient prior to that procedure. If plans are for him to undergo definitive surgery elsewhere, he should be scheduled to see urology at that institution.

## 2013-10-18 NOTE — Sedation Documentation (Signed)
Having inc pain meds as ordered

## 2013-10-18 NOTE — Progress Notes (Signed)
I have seen and examined the patient and agree with the assessment and plans.  August Gosser A. Mykal Kirchman  MD, FACS  

## 2013-10-18 NOTE — Consult Note (Signed)
WOC ostomy follow-up consult note  Stoma type/location: Colostomy to LLQ Stomal assessment/size: Stoma red and viable, above skin level, swollen and 2 inches with rod intact. Peristomal assessment: Rod is creating a difficult pouching situation Output  50cc pink liquid.  Mod amt semi-formed brown stool Ostomy pouching: 1 piece Education provided:  Pt went to bathroom and emptied pouch without assistance. Reviewed pouching routines, ordering supplies, and application. Pt able to apply one piece pouch with assistance.  Recommend home health assistance until rod is removed from stoma since it is difficult to maintain pouch seal until this appliance is removed. 4 pouches in room for discharge; placed on [place] discharge program. Pt denies further questions at this time. Julien Girt MSN, RN, [place], [place], [place]

## 2013-10-18 NOTE — Procedures (Signed)
Right nephrostogram demonstrated narrowing and blockage of the mid/distal right ureter.  5 French catheter was advanced beyond the blockage.  Successful placement of an 8 French x 28 cm ureteral stent.  The stent was patent and draining into bladder.  The nephrostomy tube was removed.  No immediate complication.

## 2013-10-18 NOTE — Sedation Documentation (Signed)
C/o nausea 

## 2013-10-18 NOTE — Care Management Note (Signed)
    Page 1 of 1   10/18/2013     10:47:49 AM CARE MANAGEMENT NOTE 10/18/2013  Patient:  Grant [name], Grant [name]   Account Number:  [id number]  Date Initiated:  10/17/2013  Documentation initiated by:  Magdalen Spatz  Subjective/Objective Assessment:     Action/Plan:   Anticipated DC Date:     Anticipated DC Plan:  [place]  In-house referral  [place]  CM consult  [facility]      [facility] Choice  HOME HEALTH   Choice offered to / List presented to:  C-1 Patient        [place] arranged  HH-1 RN      [place].   Status of service:   Medicare Important Message given?   (If response is "NO", the following Medicare IM given date fields will be blank) Date Medicare IM given:   Date Additional Medicare IM given:    Discharge Disposition:    Per UR Regulation:    If discussed at Long Length of Stay Meetings, dates discussed:   10/17/2013    Comments:  10-18-13 Confirmed facesheet information with patient . Provided community resource information for PCP to patient . Explained [place] letter . [place] letter will be given day of discharge. Magdalen Spatz RN BSN [phone]     10-17-13 left message for   Financial Counselor Philipp Ovens [phone]. Magdalen Spatz RN BSN

## 2013-10-18 NOTE — Progress Notes (Signed)
3 Days Post-Op  Subjective: Pt doing well today; no new c/o  Objective: Vital signs in last 24 hours: Temp:  [97.8 F (36.6 C)-97.9 F (36.6 C)] 97.8 F (36.6 C) (06/05 0514) Pulse Rate:  [76-80] 78 (06/05 0514) Resp:  [16-17] 16 (06/05 0514) BP: (138-140)/(78-85) 140/85 mmHg (06/05 0514) SpO2:  [100 %] 100 % (06/05 0514) Weight:  [158 lb (71.668 kg)] 158 lb (71.668 kg) (06/05 0514) Last BM Date: 10/17/13  Intake/Output from previous day: 06/04 0701 - 06/05 0700 In: 3861 [P.O.:1824; I.V.:2037] Out: 5245 [Urine:4750; Stool:495] Intake/Output this shift: Total I/O In: 720 [P.O.:720] Out: -   Right PCN intact, minimal tenderness, output 2550 cc's clear light yellow urine; chest- CTA bilat; heart- RRR; abd-soft,+BS,LLQ colostomy in place, mildly tender; EXT - no edema  Lab Results:   Recent Labs  10/17/13 0440 10/18/13 0528  WBC 5.3 5.2  HGB 9.1* 9.0*  HCT 30.1* 30.2*  PLT 381 320   BMET  Recent Labs  10/15/13 1330 10/17/13 0440  NA 137 137  K 4.4 4.4  CL 99 104  CO2 29 26  GLUCOSE 129* 91  BUN 12 12  CREATININE 0.94 0.97  CALCIUM 8.9 8.6   PT/INR No results found for this basename: LABPROT, INR,  in the last 72 hours ABG No results found for this basename: PHART, PCO2, PO2, HCO3,  in the last 72 hours  Studies/Results: Ct Pelvis Wo Contrast  10/18/2013   CLINICAL DATA:  Cystogram to evaluate for colovesicular fistula.  EXAM: CT PELVIS WITHOUT CONTRAST  TECHNIQUE: Multidetector CT imaging of the pelvis was performed following the standard protocol without intravenous contrast. Prior to imaging, 300 cc of Isovue-300 was injected into the bladder.  COMPARISON:  Abdominal CT 10/09/2013  FINDINGS: 300 cc of iodinated contrast was injected into the bladder. There is good distention of the urinary bladder. No contrast is seen exiting the bladder into the perivesicular space or colon. High-density stool within the anus is likely residual from comparison CT, which used  oral contrast. Undulating luminal contour of the posterior bladder wall is likely mucosal redundancy as there was no clear mural invasion on the preceding enhanced CT. A large colorectal adenocarcinoma has a grossly stable appearance. No development of fluid collection.  New extraluminal gas in the left lower quadrant related to diverting colostomy.  Moderate bilateral hip osteoarthritis. Lower lumbar facet osteoarthritis causing advanced spinal canal stenosis at L4-5.  IMPRESSION: No colovesicular fistula identified.   Electronically Signed   By: Jorje Guild M.D.   On: 10/18/2013 02:23    Anti-infectives: Anti-infectives   Start     Dose/Rate Route Frequency Ordered Stop   10/09/13 2130  ciprofloxacin (CIPRO) IVPB 400 mg  Status:  Discontinued     400 mg 200 mL/hr over 60 Minutes Intravenous Every 12 hours 10/09/13 2108 10/16/13 0750   10/09/13 2130  metroNIDAZOLE (FLAGYL) IVPB 500 mg  Status:  Discontinued     500 mg 100 mL/hr over 60 Minutes Intravenous Every 8 hours 10/09/13 2109 10/16/13 0750   10/09/13 2045  ciprofloxacin (CIPRO) IVPB 400 mg  Status:  Discontinued     400 mg 200 mL/hr over 60 Minutes Intravenous Every 12 hours 10/09/13 2041 10/09/13 2103   10/09/13 2045  metroNIDAZOLE (FLAGYL) IVPB 500 mg  Status:  Discontinued     500 mg 100 mL/hr over 60 Minutes Intravenous Every 8 hours 10/09/13 2042 10/09/13 2109      Assessment/Plan: s/p Procedure(s): LAP ASSISTED  LOOP COLOSTOMY (  N/A) S/p right PCN 5/29 secondary to 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 Details/risks of procedure d/w pt with his understanding and consent.  LOS: 9 days    D Rowe Robert 10/18/2013

## 2013-10-19 LAB — CBC
HCT: 34 % — ABNORMAL LOW (ref 39.0–52.0)
Hemoglobin: 10.2 g/dL — ABNORMAL LOW (ref 13.0–17.0)
MCH: 21.7 pg — AB (ref 26.0–34.0)
MCHC: 30 g/dL (ref 30.0–36.0)
MCV: 72.2 fL — ABNORMAL LOW (ref 78.0–100.0)
PLATELETS: 430 10*3/uL — AB (ref 150–400)
RBC: 4.71 MIL/uL (ref 4.22–5.81)
RDW: 24.4 % — AB (ref 11.5–15.5)
WBC: 9 10*3/uL (ref 4.0–10.5)

## 2013-10-19 MED ORDER — SODIUM CHLORIDE 0.9 % IJ SOLN
10.0000 mL | INTRAMUSCULAR | Status: DC | PRN
  Administered 2013-10-19 – 2013-10-20 (×2): 10 mL

## 2013-10-19 NOTE — Progress Notes (Signed)
[facility] PROGRESS NOTE  Grant [name] YHC:[id number] DOB: Sep 19, 1955 DOA: 10/09/2013 PCP: No PCP Per Patient  Brief narrative:  58 year old male with no significant past medical history who presented to [facility] ED 10/09/2013 with reports of intermittent diarrhea, significant weight loss of about 20 pounds in last month, unintentional. He did have intentional 150 pound weight loss over past few years. In ED, patient had CT abdomen and pelvis and chest and was found to have large mass in the rectosigmoid colon concerning for locally invasive colorectal cancer likely with contained perforation. There was also gas seen within the urinary bladder concerning for colovesical fistula. Small bilateral lower lobe pulmonary nodules were also seen. Pt underwent colonoscopy and right PCN placement with biopsy on 529/2015. Patient was started on TPN 10/14/2013 for nutritional support. diverting colostomy done on  10/15/2013.   Assessment and plan:  Principal problem:  Locally advanced rectal cancer and possible contained perforation  Pt underwent colonoscopy on 10/11/2013. Patient was seen and evaluated by surgery and  recommend diverting colostomy which was done on 6/2. Tolerated sx well. Passing gas today.  -Pain stable  CEA was 3.7 WNL, CA 19-9 is 10.3 (low), CEA 125 is 7.2 (WNL), AFP is 1.7 (WNL), pre-albumin is low at 5.5.  Dr. Annamaria Boots of oncology has seen and evaluated the pt. Rectal bx shows adenocarcinoma.Plan on chemotherapy as oupt. Has appt with Dr Benay Spice on 11/08/13 at 1:30 pm. Off abx  ( completed 7 days)   Active Problems:  Large pelvic mass with rectal cancer and possible bladder and right ureteral obstruction  Patient was seen and evaluated by urology. 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 -Right percutaneous nephrostomy tube placed by IR on 5/29. A CT cystogram was done on 6/4 following diversion colostomy which showed no colovesical fistula. antegrade right ureteral stent  to be placed by IR and right percutaneous nephrostomy tube was removed. Patient tolerated procedure well and has good urine output.   E.Coli UTI  E.Coli sensitive to Cipro . completed 7 days of cipro and flagyl already.  Acute blood loss anemia  Likely secondary to advanced colorectal cancer  Has received total of 4 units PRBC blood transfusion since admission.  Hemoglobin remains stable at present   Severe protein calorie malnutrition  significant weight loss of 20 pounds in the past one month . Low albumin and prealbumin. Received TNA for nutritional support.  Advanced diet to full liquids. ( NPO for IR procedure today)  Continue supplements.   Code Status: full code Family Communication: none at bedside Disposition Plan: Patient clinically improving. Will be discharged in a.m.   Consultants:  [place]  IR  Oncology   urology  Procedures:  Right percutaneous nephrostomy by IR on 5/29  Diversion colostomy on 6 /2  anterograde right ureteral stent placement followed by removal of Right percutaneous nephrostomy by IR on 6/5   Antibiotics:  Completed 7 days of IV cipro  HPI/Subjective: Abdominal pain improving.   Objective: Filed Vitals:   10/19/13 0645  BP: 141/74  Pulse: 84  Temp: 98.4 F (36.9 C)  Resp: 16    Intake/Output Summary (Last 24 hours) at 10/19/13 1433 Last data filed at 10/19/13 1400  Gross per 24 hour  Intake   3260 ml  Output   2900 ml  Net    360 ml   Filed Weights   10/16/13 0500 10/17/13 0515 10/18/13 0514  Weight: 68.493 kg (151 lb) 67.5 kg (148 lb 13 oz) 71.668 kg (158 lb)  Exam:   General:  Middle aged male in NAD  HEENT: no pallor, moist mucosa  Cardiovascular: NS1&S2, no murmurs, rubs or gallop  Chest: clear b/l, no added sounds  ABD: soft, ND, BS +, colostomy in place, right percutaneous nephrostomy removal site stable  Ext: Warm, no edema   CNS: AAOX3   Data Reviewed: Basic Metabolic Panel:  Recent  Labs Lab 10/13/13 0600 10/14/13 0500 10/15/13 1330 10/17/13 0440  NA 136* 137 137 137  K 3.5* 4.1 4.4 4.4  CL 101 101 99 104  CO2 23 26 29 26   GLUCOSE 103* 117* 129* 91  BUN 6 5* 12 12  CREATININE 0.98 0.94 0.94 0.97  CALCIUM 8.2* 8.4 8.9 8.6  MG 2.1 2.0  --   --   PHOS 2.8 3.2  --   --    Liver Function Tests:  Recent Labs Lab 10/13/13 0600 10/14/13 0500  AST 11 11  ALT 9 8  ALKPHOS 58 55  BILITOT 0.4 0.3  PROT 5.7* 5.6*  ALBUMIN 2.1* 2.0*   No results found for this basename: LIPASE, AMYLASE,  in the last 168 hours No results found for this basename: AMMONIA,  in the last 168 hours CBC:  Recent Labs Lab 10/13/13 0600 10/14/13 0500 10/15/13 0440 10/16/13 0540 10/17/13 0440 10/18/13 0528 10/19/13 0545  WBC 7.4 6.2 6.5 6.1 5.3 5.2 9.0  NEUTROABS 6.2 4.7  --   --   --   --   --   HGB 8.8* 8.9* 9.3* 9.7* 9.1* 9.0* 10.2*  HCT 28.4* 28.6* 30.4* 31.3* 30.1* 30.2* 34.0*  MCV 69.8* 70.4* 71.0* 71.6* 72.0* 72.9* 72.2*  PLT 369 390 408* 398 381 320 430*   Cardiac Enzymes: No results found for this basename: CKTOTAL, CKMB, CKMBINDEX, TROPONINI,  in the last 168 hours BNP (last 3 results) No results found for this basename: PROBNP,  in the last 8760 hours CBG:  Recent Labs Lab 10/15/13 1212 10/15/13 1546 10/15/13 2141 10/16/13 0430 10/16/13 0944  GLUCAP 161* 135* 150* 133* 211*    Recent Results (from the past 240 hour(s))  URINE CULTURE     Status: None   Collection Time    10/09/13  5:22 PM      Result Value Ref Range Status   Specimen Description URINE, CLEAN CATCH   Final   Special Requests ADDED [id number]   Final   Culture  Setup Time     Final   Value: 10/09/2013 22:53     Performed at [facility] Count     Final   Value: >=100,000 COLONIES/ML     Performed at [facility]   Culture     Final   Value: ESCHERICHIA COLI     Performed at [facility]   Report Status 10/12/2013 FINAL   Final   Organism ID,  Bacteria ESCHERICHIA COLI   Final  SURGICAL PCR SCREEN     Status: None   Collection Time    10/14/13  1:40 PM      Result Value Ref Range Status   MRSA, PCR NEGATIVE  NEGATIVE Final   Staphylococcus aureus NEGATIVE  NEGATIVE Final   Comment:            The Xpert SA Assay (FDA     approved for NASAL specimens     in patients over 29 years of age),     is one component of     a comprehensive  surveillance     program.  Test performance has     been validated by [facility] for patients greater     than or equal to 88 year old.     It is not intended     to diagnose infection nor to     guide or monitor treatment.     Studies: Ct Pelvis Wo Contrast  10/18/2013   CLINICAL DATA:  Cystogram to evaluate for colovesicular fistula.  EXAM: CT PELVIS WITHOUT CONTRAST  TECHNIQUE: Multidetector CT imaging of the pelvis was performed following the standard protocol without intravenous contrast. Prior to imaging, 300 cc of Isovue-300 was injected into the bladder.  COMPARISON:  Abdominal CT 10/09/2013  FINDINGS: 300 cc of iodinated contrast was injected into the bladder. There is good distention of the urinary bladder. No contrast is seen exiting the bladder into the perivesicular space or colon. High-density stool within the anus is likely residual from comparison CT, which used oral contrast. Undulating luminal contour of the posterior bladder wall is likely mucosal redundancy as there was no clear mural invasion on the preceding enhanced CT. A large colorectal adenocarcinoma has a grossly stable appearance. No development of fluid collection.  New extraluminal gas in the left lower quadrant related to diverting colostomy.  Moderate bilateral hip osteoarthritis. Lower lumbar facet osteoarthritis causing advanced spinal canal stenosis at L4-5.  IMPRESSION: No colovesicular fistula identified.   Electronically Signed   By: Jorje Guild M.D.   On: 10/18/2013 02:23   Ir Nephrostogram Right  10/18/2013    CLINICAL DATA:  58 year old with a large rectal mass and right ureter obstruction. The patient has a right percutaneous nephrostomy tube. Plan for placement of an antegrade ureter stent.  EXAM: PLACEMENT OF ANTEGRADE RIGHT URETER STENT; RIGHT NEPHROSTOGRAM  Physician: Stephan Minister. Henn, MD  FLUOROSCOPY TIME:  5 min and 18 seconds  MEDICATIONS: 2.5 mg versed, 100 mcg fentanyl. A radiology nurse monitored the patient for moderate sedation.  ANESTHESIA/SEDATION: Moderate sedation time: 25 min  PROCEDURE: The procedure was explained to the patient. The risks and benefits of the procedure were discussed and the patient's questions were addressed. Informed consent was obtained from the patient. The patient was placed prone. The right flank and catheter were prepped and draped in a sterile fashion. Maximal barrier sterile technique was utilized including caps, mask, sterile gowns, sterile gloves, sterile drape, hand hygiene and skin antiseptic. The skin around the catheter was anesthetized with 1% lidocaine. The catheter suture was removed. Contrast was injected through the right nephrostomy tube and a nephrostogram was performed. The catheter was removed over a Bentson wire. A 5 French Kumpe catheter was advanced into the right ureter. Additional images were obtained of the right ureter. Catheter and wire were successfully advanced into the urinary bladder. A stiff Amplatz wire was placed. The 5 French catheter was exchanged for a 7 [place] vascular sheath. The Bentson wire was placed through the sheath as a safety wire. A 8 French x 28 cm ureter stent was selected. The stent was advanced over the Amplatz wire. The distal aspect of the stent was reconstituted in the urinary bladder. The proximal aspect of the stent was successfully reconstituted in the renal pelvis. Contrast was injected through the remaining catheter. The ureter stent was successfully draining. The catheter and safety wire were completely removed. Bandage  placed over the puncture site.  FINDINGS: The right nephrostomy tube was well positioned in the renal pelvis. There is  blockage of the right ureter in the mid/distal ureter region. Pull-back injection in the ureter demonstrated marked narrowing of the ureter throughout the pelvis and consistent with compression from the rectal mass. The right ureter stent proximal aspect is in the renal pelvis and the distal aspect is in the urinary bladder. The stent was draining at the end of the procedure.  COMPLICATIONS: None  IMPRESSION: Successful placement of a right ureter stent.  Removal of the right nephrostomy tube.   Electronically Signed   By: Markus Daft M.D.   On: 10/18/2013 17:42   Ir Oris Drone Cath Perc Right  10/18/2013   CLINICAL DATA:  58 year old with a large rectal mass and right ureter obstruction. The patient has a right percutaneous nephrostomy tube. Plan for placement of an antegrade ureter stent.  EXAM: PLACEMENT OF ANTEGRADE RIGHT URETER STENT; RIGHT NEPHROSTOGRAM  Physician: Stephan Minister. Henn, MD  FLUOROSCOPY TIME:  5 min and 18 seconds  MEDICATIONS: 2.5 mg versed, 100 mcg fentanyl. A radiology nurse monitored the patient for moderate sedation.  ANESTHESIA/SEDATION: Moderate sedation time: 25 min  PROCEDURE: The procedure was explained to the patient. The risks and benefits of the procedure were discussed and the patient's questions were addressed. Informed consent was obtained from the patient. The patient was placed prone. The right flank and catheter were prepped and draped in a sterile fashion. Maximal barrier sterile technique was utilized including caps, mask, sterile gowns, sterile gloves, sterile drape, hand hygiene and skin antiseptic. The skin around the catheter was anesthetized with 1% lidocaine. The catheter suture was removed. Contrast was injected through the right nephrostomy tube and a nephrostogram was performed. The catheter was removed over a Bentson wire. A 5 French Kumpe catheter was  advanced into the right ureter. Additional images were obtained of the right ureter. Catheter and wire were successfully advanced into the urinary bladder. A stiff Amplatz wire was placed. The 5 French catheter was exchanged for a 7 [place] vascular sheath. The Bentson wire was placed through the sheath as a safety wire. A 8 French x 28 cm ureter stent was selected. The stent was advanced over the Amplatz wire. The distal aspect of the stent was reconstituted in the urinary bladder. The proximal aspect of the stent was successfully reconstituted in the renal pelvis. Contrast was injected through the remaining catheter. The ureter stent was successfully draining. The catheter and safety wire were completely removed. Bandage placed over the puncture site.  FINDINGS: The right nephrostomy tube was well positioned in the renal pelvis. There is blockage of the right ureter in the mid/distal ureter region. Pull-back injection in the ureter demonstrated marked narrowing of the ureter throughout the pelvis and consistent with compression from the rectal mass. The right ureter stent proximal aspect is in the renal pelvis and the distal aspect is in the urinary bladder. The stent was draining at the end of the procedure.  COMPLICATIONS: None  IMPRESSION: Successful placement of a right ureter stent.  Removal of the right nephrostomy tube.   Electronically Signed   By: Markus Daft M.D.   On: 10/18/2013 17:42    Scheduled Meds: . heparin  5,000 Units Subcutaneous 3 times per day  . lactose free nutrition  237 mL Oral TID WC  . multivitamin with minerals  1 tablet Oral Daily  . peg 3350 powder  0.5 kit Oral Once  . sodium chloride  10-40 mL Intracatheter Q12H   Continuous Infusions: . sodium chloride 10 mL/hr at 10/19/13  5844      Time spent: 25 minutes    Martyna Thorns  [facility] Pager [phone] If 7PM-7AM, please contact night-coverage at [email or web address], password [facility] 10/19/2013, 2:33 PM  LOS: 10  days

## 2013-10-19 NOTE — Progress Notes (Signed)
Patient ID: Grant [name], male   DOB: [birth year], 57 y.o.   MRN: [id number] 4 Days Post-Op  Subjective: No complaints this morning. Tolerating a diet. Denies abdominal pain.  Objective: Vital signs in last 24 hours: Temp:  [97.9 F (36.6 C)-98.5 F (36.9 C)] 98.4 F (36.9 C) (06/06 0645) Pulse Rate:  [71-84] 84 (06/06 0645) Resp:  [10-16] 16 (06/06 0645) BP: (124-152)/(73-94) 141/74 mmHg (06/06 0645) SpO2:  [98 %-100 %] 100 % (06/06 0645) Last BM Date: 10/18/13  Intake/Output from previous day: 06/05 0701 - 06/06 0700 In: 1920 [P.O.:720; I.V.:1200] Out: 3700 [Urine:3450; Stool:250] Intake/Output this shift:    General appearance: alert, cooperative and no distress GI: normal findings: soft, non-tender and colostomy healthy and functioning Incision/Wound: clean and dry  Lab Results:   Recent Labs  10/18/13 0528 10/19/13 0545  WBC 5.2 9.0  HGB 9.0* 10.2*  HCT 30.2* 34.0*  PLT 320 430*   BMET  Recent Labs  10/17/13 0440  NA 137  K 4.4  CL 104  CO2 26  GLUCOSE 91  BUN 12  CREATININE 0.97  CALCIUM 8.6     Studies/Results: Ct Pelvis Wo Contrast  10/18/2013   CLINICAL DATA:  Cystogram to evaluate for colovesicular fistula.  EXAM: CT PELVIS WITHOUT CONTRAST  TECHNIQUE: Multidetector CT imaging of the pelvis was performed following the standard protocol without intravenous contrast. Prior to imaging, 300 cc of Isovue-300 was injected into the bladder.  COMPARISON:  Abdominal CT 10/09/2013  FINDINGS: 300 cc of iodinated contrast was injected into the bladder. There is good distention of the urinary bladder. No contrast is seen exiting the bladder into the perivesicular space or colon. High-density stool within the anus is likely residual from comparison CT, which used oral contrast. Undulating luminal contour of the posterior bladder wall is likely mucosal redundancy as there was no clear mural invasion on the preceding enhanced CT. A large colorectal adenocarcinoma  has a grossly stable appearance. No development of fluid collection.  New extraluminal gas in the left lower quadrant related to diverting colostomy.  Moderate bilateral hip osteoarthritis. Lower lumbar facet osteoarthritis causing advanced spinal canal stenosis at L4-5.  IMPRESSION: No colovesicular fistula identified.   Electronically Signed   By: Jorje Guild M.D.   On: 10/18/2013 02:23   Ir Nephrostogram Right  10/18/2013   CLINICAL DATA:  58 year old with a large rectal mass and right ureter obstruction. The patient has a right percutaneous nephrostomy tube. Plan for placement of an antegrade ureter stent.  EXAM: PLACEMENT OF ANTEGRADE RIGHT URETER STENT; RIGHT NEPHROSTOGRAM  Physician: Stephan Minister. Henn, MD  FLUOROSCOPY TIME:  5 min and 18 seconds  MEDICATIONS: 2.5 mg versed, 100 mcg fentanyl. A radiology nurse monitored the patient for moderate sedation.  ANESTHESIA/SEDATION: Moderate sedation time: 25 min  PROCEDURE: The procedure was explained to the patient. The risks and benefits of the procedure were discussed and the patient's questions were addressed. Informed consent was obtained from the patient. The patient was placed prone. The right flank and catheter were prepped and draped in a sterile fashion. Maximal barrier sterile technique was utilized including caps, mask, sterile gowns, sterile gloves, sterile drape, hand hygiene and skin antiseptic. The skin around the catheter was anesthetized with 1% lidocaine. The catheter suture was removed. Contrast was injected through the right nephrostomy tube and a nephrostogram was performed. The catheter was removed over a Bentson wire. A 5 French Kumpe catheter was advanced into the right ureter. Additional images were obtained of  the right ureter. Catheter and wire were successfully advanced into the urinary bladder. A stiff Amplatz wire was placed. The 5 French catheter was exchanged for a 7 [place] vascular sheath. The Bentson wire was placed through the  sheath as a safety wire. A 8 French x 28 cm ureter stent was selected. The stent was advanced over the Amplatz wire. The distal aspect of the stent was reconstituted in the urinary bladder. The proximal aspect of the stent was successfully reconstituted in the renal pelvis. Contrast was injected through the remaining catheter. The ureter stent was successfully draining. The catheter and safety wire were completely removed. Bandage placed over the puncture site.  FINDINGS: The right nephrostomy tube was well positioned in the renal pelvis. There is blockage of the right ureter in the mid/distal ureter region. Pull-back injection in the ureter demonstrated marked narrowing of the ureter throughout the pelvis and consistent with compression from the rectal mass. The right ureter stent proximal aspect is in the renal pelvis and the distal aspect is in the urinary bladder. The stent was draining at the end of the procedure.  COMPLICATIONS: None  IMPRESSION: Successful placement of a right ureter stent.  Removal of the right nephrostomy tube.   Electronically Signed   By: Markus Daft M.D.   On: 10/18/2013 17:42   Ir Oris Drone Cath Perc Right  10/18/2013   CLINICAL DATA:  58 year old with a large rectal mass and right ureter obstruction. The patient has a right percutaneous nephrostomy tube. Plan for placement of an antegrade ureter stent.  EXAM: PLACEMENT OF ANTEGRADE RIGHT URETER STENT; RIGHT NEPHROSTOGRAM  Physician: Stephan Minister. Henn, MD  FLUOROSCOPY TIME:  5 min and 18 seconds  MEDICATIONS: 2.5 mg versed, 100 mcg fentanyl. A radiology nurse monitored the patient for moderate sedation.  ANESTHESIA/SEDATION: Moderate sedation time: 25 min  PROCEDURE: The procedure was explained to the patient. The risks and benefits of the procedure were discussed and the patient's questions were addressed. Informed consent was obtained from the patient. The patient was placed prone. The right flank and catheter were prepped and draped in  a sterile fashion. Maximal barrier sterile technique was utilized including caps, mask, sterile gowns, sterile gloves, sterile drape, hand hygiene and skin antiseptic. The skin around the catheter was anesthetized with 1% lidocaine. The catheter suture was removed. Contrast was injected through the right nephrostomy tube and a nephrostogram was performed. The catheter was removed over a Bentson wire. A 5 French Kumpe catheter was advanced into the right ureter. Additional images were obtained of the right ureter. Catheter and wire were successfully advanced into the urinary bladder. A stiff Amplatz wire was placed. The 5 French catheter was exchanged for a 7 [place] vascular sheath. The Bentson wire was placed through the sheath as a safety wire. A 8 French x 28 cm ureter stent was selected. The stent was advanced over the Amplatz wire. The distal aspect of the stent was reconstituted in the urinary bladder. The proximal aspect of the stent was successfully reconstituted in the renal pelvis. Contrast was injected through the remaining catheter. The ureter stent was successfully draining. The catheter and safety wire were completely removed. Bandage placed over the puncture site.  FINDINGS: The right nephrostomy tube was well positioned in the renal pelvis. There is blockage of the right ureter in the mid/distal ureter region. Pull-back injection in the ureter demonstrated marked narrowing of the ureter throughout the pelvis and consistent with compression from the rectal mass. The right  ureter stent proximal aspect is in the renal pelvis and the distal aspect is in the urinary bladder. The stent was draining at the end of the procedure.  COMPLICATIONS: None  IMPRESSION: Successful placement of a right ureter stent.  Removal of the right nephrostomy tube.   Electronically Signed   By: Markus Daft M.D.   On: 10/18/2013 17:42    Anti-infectives: Anti-infectives   Start     Dose/Rate Route Frequency Ordered Stop    10/18/13 1400  ciprofloxacin (CIPRO) IVPB 400 mg     400 mg 200 mL/hr over 60 Minutes Intravenous On call 10/18/13 1024 10/18/13 1625   10/09/13 2130  ciprofloxacin (CIPRO) IVPB 400 mg  Status:  Discontinued     400 mg 200 mL/hr over 60 Minutes Intravenous Every 12 hours 10/09/13 2108 10/16/13 0750   10/09/13 2130  metroNIDAZOLE (FLAGYL) IVPB 500 mg  Status:  Discontinued     500 mg 100 mL/hr over 60 Minutes Intravenous Every 8 hours 10/09/13 2109 10/16/13 0750   10/09/13 2045  ciprofloxacin (CIPRO) IVPB 400 mg  Status:  Discontinued     400 mg 200 mL/hr over 60 Minutes Intravenous Every 12 hours 10/09/13 2041 10/09/13 2103   10/09/13 2045  metroNIDAZOLE (FLAGYL) IVPB 500 mg  Status:  Discontinued     500 mg 100 mL/hr over 60 Minutes Intravenous Every 8 hours 10/09/13 2042 10/09/13 2109      Assessment/Plan: s/p Procedure(s): LAP ASSISTED  LOOP COLOSTOMY Doing well following colostomy without complication. Stable for discharge from surgery perspective   LOS: 10 days    Edward Jolly 10/19/2013

## 2013-10-19 NOTE — Progress Notes (Signed)
Subjective: Patient states he is doing well and hope to ambulate the halls today. He denies any bleeding or pain at site.   Objective: Physical Exam: BP 141/74  Pulse 84  Temp(Src) 98.4 F (36.9 C) (Oral)  Resp 16  Ht 6\' 6"  (1.981 m)  Wt 158 lb (71.668 kg)  BMI 18.26 kg/m2  SpO2 100%  General: A&Ox3, NAD, sitting up in bed Abd: Right ureteral stent site dressing C/D/I, no evidence of bleeding or hematoma, NT  Labs: CBC  Recent Labs  10/18/13 0528 10/19/13 0545  WBC 5.2 9.0  HGB 9.0* 10.2*  HCT 30.2* 34.0*  PLT 320 430*   BMET  Recent Labs  10/17/13 0440  NA 137  K 4.4  CL 104  CO2 26  GLUCOSE 91  BUN 12  CREATININE 0.97  CALCIUM 8.6   LFT No results found for this basename: PROT, ALBUMIN, AST, ALT, ALKPHOS, BILITOT, BILIDIR, IBILI, LIPASE,  in the last 72 hours PT/INR No results found for this basename: LABPROT, INR,  in the last 72 hours   Studies/Results: Ct Pelvis Wo Contrast  10/18/2013   CLINICAL DATA:  Cystogram to evaluate for colovesicular fistula.  EXAM: CT PELVIS WITHOUT CONTRAST  TECHNIQUE: Multidetector CT imaging of the pelvis was performed following the standard protocol without intravenous contrast. Prior to imaging, 300 cc of Isovue-300 was injected into the bladder.  COMPARISON:  Abdominal CT 10/09/2013  FINDINGS: 300 cc of iodinated contrast was injected into the bladder. There is good distention of the urinary bladder. No contrast is seen exiting the bladder into the perivesicular space or colon. High-density stool within the anus is likely residual from comparison CT, which used oral contrast. Undulating luminal contour of the posterior bladder wall is likely mucosal redundancy as there was no clear mural invasion on the preceding enhanced CT. A large colorectal adenocarcinoma has a grossly stable appearance. No development of fluid collection.  New extraluminal gas in the left lower quadrant related to diverting colostomy.  Moderate bilateral  hip osteoarthritis. Lower lumbar facet osteoarthritis causing advanced spinal canal stenosis at L4-5.  IMPRESSION: No colovesicular fistula identified.   Electronically Signed   By: Jorje Guild M.D.   On: 10/18/2013 02:23   Ir Nephrostogram Right  10/18/2013   CLINICAL DATA:  58 year old with a large rectal mass and right ureter obstruction. The patient has a right percutaneous nephrostomy tube. Plan for placement of an antegrade ureter stent.  EXAM: PLACEMENT OF ANTEGRADE RIGHT URETER STENT; RIGHT NEPHROSTOGRAM  Physician: Stephan Minister. Henn, MD  FLUOROSCOPY TIME:  5 min and 18 seconds  MEDICATIONS: 2.5 mg versed, 100 mcg fentanyl. A radiology nurse monitored the patient for moderate sedation.  ANESTHESIA/SEDATION: Moderate sedation time: 25 min  PROCEDURE: The procedure was explained to the patient. The risks and benefits of the procedure were discussed and the patient's questions were addressed. Informed consent was obtained from the patient. The patient was placed prone. The right flank and catheter were prepped and draped in a sterile fashion. Maximal barrier sterile technique was utilized including caps, mask, sterile gowns, sterile gloves, sterile drape, hand hygiene and skin antiseptic. The skin around the catheter was anesthetized with 1% lidocaine. The catheter suture was removed. Contrast was injected through the right nephrostomy tube and a nephrostogram was performed. The catheter was removed over a Bentson wire. A 5 French Kumpe catheter was advanced into the right ureter. Additional images were obtained of the right ureter. Catheter and wire were successfully advanced into the  urinary bladder. A stiff Amplatz wire was placed. The 5 French catheter was exchanged for a 7 [place] vascular sheath. The Bentson wire was placed through the sheath as a safety wire. A 8 French x 28 cm ureter stent was selected. The stent was advanced over the Amplatz wire. The distal aspect of the stent was reconstituted in the  urinary bladder. The proximal aspect of the stent was successfully reconstituted in the renal pelvis. Contrast was injected through the remaining catheter. The ureter stent was successfully draining. The catheter and safety wire were completely removed. Bandage placed over the puncture site.  FINDINGS: The right nephrostomy tube was well positioned in the renal pelvis. There is blockage of the right ureter in the mid/distal ureter region. Pull-back injection in the ureter demonstrated marked narrowing of the ureter throughout the pelvis and consistent with compression from the rectal mass. The right ureter stent proximal aspect is in the renal pelvis and the distal aspect is in the urinary bladder. The stent was draining at the end of the procedure.  COMPLICATIONS: None  IMPRESSION: Successful placement of a right ureter stent.  Removal of the right nephrostomy tube.   Electronically Signed   By: Markus Daft M.D.   On: 10/18/2013 17:42   Ir Oris Drone Cath Perc Right  10/18/2013   CLINICAL DATA:  58 year old with a large rectal mass and right ureter obstruction. The patient has a right percutaneous nephrostomy tube. Plan for placement of an antegrade ureter stent.  EXAM: PLACEMENT OF ANTEGRADE RIGHT URETER STENT; RIGHT NEPHROSTOGRAM  Physician: Stephan Minister. Henn, MD  FLUOROSCOPY TIME:  5 min and 18 seconds  MEDICATIONS: 2.5 mg versed, 100 mcg fentanyl. A radiology nurse monitored the patient for moderate sedation.  ANESTHESIA/SEDATION: Moderate sedation time: 25 min  PROCEDURE: The procedure was explained to the patient. The risks and benefits of the procedure were discussed and the patient's questions were addressed. Informed consent was obtained from the patient. The patient was placed prone. The right flank and catheter were prepped and draped in a sterile fashion. Maximal barrier sterile technique was utilized including caps, mask, sterile gowns, sterile gloves, sterile drape, hand hygiene and skin antiseptic. The  skin around the catheter was anesthetized with 1% lidocaine. The catheter suture was removed. Contrast was injected through the right nephrostomy tube and a nephrostogram was performed. The catheter was removed over a Bentson wire. A 5 French Kumpe catheter was advanced into the right ureter. Additional images were obtained of the right ureter. Catheter and wire were successfully advanced into the urinary bladder. A stiff Amplatz wire was placed. The 5 French catheter was exchanged for a 7 [place] vascular sheath. The Bentson wire was placed through the sheath as a safety wire. A 8 French x 28 cm ureter stent was selected. The stent was advanced over the Amplatz wire. The distal aspect of the stent was reconstituted in the urinary bladder. The proximal aspect of the stent was successfully reconstituted in the renal pelvis. Contrast was injected through the remaining catheter. The ureter stent was successfully draining. The catheter and safety wire were completely removed. Bandage placed over the puncture site.  FINDINGS: The right nephrostomy tube was well positioned in the renal pelvis. There is blockage of the right ureter in the mid/distal ureter region. Pull-back injection in the ureter demonstrated marked narrowing of the ureter throughout the pelvis and consistent with compression from the rectal mass. The right ureter stent proximal aspect is in the renal pelvis and the  distal aspect is in the urinary bladder. The stent was draining at the end of the procedure.  COMPLICATIONS: None  IMPRESSION: Successful placement of a right ureter stent.  Removal of the right nephrostomy tube.   Electronically Signed   By: Markus Daft M.D.   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    LOS: 10 days    Hedy Jacob PA-C 10/19/2013 11:58 AM

## 2013-10-20 DIAGNOSIS — C189 Malignant neoplasm of colon, unspecified: Secondary | ICD-10-CM | POA: Diagnosis present

## 2013-10-20 LAB — IRON AND TIBC
IRON: 30 ug/dL — AB (ref 42–135)
SATURATION RATIOS: 16 % — AB (ref 20–55)
TIBC: 188 ug/dL — AB (ref 215–435)
UIBC: 158 ug/dL (ref 125–400)

## 2013-10-20 LAB — CBC
HCT: 29.1 % — ABNORMAL LOW (ref 39.0–52.0)
Hemoglobin: 8.6 g/dL — ABNORMAL LOW (ref 13.0–17.0)
MCH: 21.6 pg — ABNORMAL LOW (ref 26.0–34.0)
MCHC: 29.6 g/dL — ABNORMAL LOW (ref 30.0–36.0)
MCV: 72.9 fL — ABNORMAL LOW (ref 78.0–100.0)
Platelets: 313 10*3/uL (ref 150–400)
RBC: 3.99 MIL/uL — ABNORMAL LOW (ref 4.22–5.81)
RDW: 24.8 % — AB (ref 11.5–15.5)
WBC: 6 10*3/uL (ref 4.0–10.5)

## 2013-10-20 MED ORDER — IBUPROFEN 600 MG PO TABS: 600.0000 mg | ORAL_TABLET | Freq: Four times a day (QID) | ORAL | Status: DC | PRN

## 2013-10-20 MED ORDER — ADULT MULTIVITAMIN W/MINERALS CH: 1.0000 | ORAL_TABLET | Freq: Every day | ORAL | Status: DC

## 2013-10-20 MED ORDER — ALPRAZOLAM 0.5 MG PO TABS: 0.5000 mg | ORAL_TABLET | Freq: Two times a day (BID) | ORAL | Status: DC | PRN

## 2013-10-20 MED ORDER — BOOST PLUS PO LIQD: 237.0000 mL | Freq: Three times a day (TID) | ORAL | Status: DC

## 2013-10-20 MED ORDER — OXYCODONE-ACETAMINOPHEN 5-325 MG PO TABS: 2.0000 | ORAL_TABLET | ORAL | Status: DC | PRN

## 2013-10-20 MED ORDER — FERROUS SULFATE 325 (65 FE) MG PO TABS: 325.0000 mg | ORAL_TABLET | Freq: Two times a day (BID) | ORAL | Status: DC

## 2013-10-20 NOTE — Progress Notes (Signed)
Pt discharged to home accomp by friends.  Rx given and explained to pt.  Adv. Home care will do follow up with ostomy education.  Pt given supplies for ostomy and was educated by Derald Macleod, RN and demonstrated how to change the ostomy.  Pt has all follow up appts (Dr. Benay Spice for 6/26 and Dr. Ninfa Linden for [facility]).

## 2013-10-20 NOTE — Progress Notes (Signed)
Patient ID: Grant [name], male   DOB: November 11, 1955, 58 y.o.   MRN: [id number] 5 Days Post-Op  Subjective: No complaints this morning. Tolerating regular diet well. Ostomy functioning.  Objective: Vital signs in last 24 hours: Temp:  [97 F (36.1 C)-99.3 F (37.4 C)] 98.4 F (36.9 C) (06/07 0514) Pulse Rate:  [76-85] 85 (06/07 0514) Resp:  [17-18] 18 (06/07 0514) BP: (138)/(75-77) 138/77 mmHg (06/07 0514) SpO2:  [96 %-100 %] 96 % (06/07 0514) Last BM Date: 10/19/13  Intake/Output from previous day: 06/06 0701 - 06/07 0700 In: 2300 [P.O.:1040; I.V.:1260] Out: 351 [Urine:350; Stool:1] Intake/Output this shift:    General appearance: alert, cooperative and no distress GI: normal findings: soft, non-tender and stoma healthy Incision/Wound: clean and dry  Lab Results:   Recent Labs  10/19/13 0545 10/20/13 0455  WBC 9.0 6.0  HGB 10.2* 8.6*  HCT 34.0* 29.1*  PLT 430* 313   BMET No results found for this basename: NA, K, CL, CO2, GLUCOSE, BUN, CREATININE, CALCIUM,  in the last 72 hours   Studies/Results: Ir Nephrostogram Right  10/18/2013   CLINICAL DATA:  58 year old with a large rectal mass and right ureter obstruction. The patient has a right percutaneous nephrostomy tube. Plan for placement of an antegrade ureter stent.  EXAM: PLACEMENT OF ANTEGRADE RIGHT URETER STENT; RIGHT NEPHROSTOGRAM  Physician: Stephan Minister. Henn, MD  FLUOROSCOPY TIME:  5 min and 18 seconds  MEDICATIONS: 2.5 mg versed, 100 mcg fentanyl. A radiology nurse monitored the patient for moderate sedation.  ANESTHESIA/SEDATION: Moderate sedation time: 25 min  PROCEDURE: The procedure was explained to the patient. The risks and benefits of the procedure were discussed and the patient's questions were addressed. Informed consent was obtained from the patient. The patient was placed prone. The right flank and catheter were prepped and draped in a sterile fashion. Maximal barrier sterile technique was utilized including  caps, mask, sterile gowns, sterile gloves, sterile drape, hand hygiene and skin antiseptic. The skin around the catheter was anesthetized with 1% lidocaine. The catheter suture was removed. Contrast was injected through the right nephrostomy tube and a nephrostogram was performed. The catheter was removed over a Bentson wire. A 5 French Kumpe catheter was advanced into the right ureter. Additional images were obtained of the right ureter. Catheter and wire were successfully advanced into the urinary bladder. A stiff Amplatz wire was placed. The 5 French catheter was exchanged for a 7 [place] vascular sheath. The Bentson wire was placed through the sheath as a safety wire. A 8 French x 28 cm ureter stent was selected. The stent was advanced over the Amplatz wire. The distal aspect of the stent was reconstituted in the urinary bladder. The proximal aspect of the stent was successfully reconstituted in the renal pelvis. Contrast was injected through the remaining catheter. The ureter stent was successfully draining. The catheter and safety wire were completely removed. Bandage placed over the puncture site.  FINDINGS: The right nephrostomy tube was well positioned in the renal pelvis. There is blockage of the right ureter in the mid/distal ureter region. Pull-back injection in the ureter demonstrated marked narrowing of the ureter throughout the pelvis and consistent with compression from the rectal mass. The right ureter stent proximal aspect is in the renal pelvis and the distal aspect is in the urinary bladder. The stent was draining at the end of the procedure.  COMPLICATIONS: None  IMPRESSION: Successful placement of a right ureter stent.  Removal of the right nephrostomy tube.  Electronically Signed   By: Markus Daft M.D.   On: 10/18/2013 17:42   Ir Oris Drone Cath Perc Right  10/18/2013   CLINICAL DATA:  58 year old with a large rectal mass and right ureter obstruction. The patient has a right percutaneous  nephrostomy tube. Plan for placement of an antegrade ureter stent.  EXAM: PLACEMENT OF ANTEGRADE RIGHT URETER STENT; RIGHT NEPHROSTOGRAM  Physician: Stephan Minister. Henn, MD  FLUOROSCOPY TIME:  5 min and 18 seconds  MEDICATIONS: 2.5 mg versed, 100 mcg fentanyl. A radiology nurse monitored the patient for moderate sedation.  ANESTHESIA/SEDATION: Moderate sedation time: 25 min  PROCEDURE: The procedure was explained to the patient. The risks and benefits of the procedure were discussed and the patient's questions were addressed. Informed consent was obtained from the patient. The patient was placed prone. The right flank and catheter were prepped and draped in a sterile fashion. Maximal barrier sterile technique was utilized including caps, mask, sterile gowns, sterile gloves, sterile drape, hand hygiene and skin antiseptic. The skin around the catheter was anesthetized with 1% lidocaine. The catheter suture was removed. Contrast was injected through the right nephrostomy tube and a nephrostogram was performed. The catheter was removed over a Bentson wire. A 5 French Kumpe catheter was advanced into the right ureter. Additional images were obtained of the right ureter. Catheter and wire were successfully advanced into the urinary bladder. A stiff Amplatz wire was placed. The 5 French catheter was exchanged for a 7 [place] vascular sheath. The Bentson wire was placed through the sheath as a safety wire. A 8 French x 28 cm ureter stent was selected. The stent was advanced over the Amplatz wire. The distal aspect of the stent was reconstituted in the urinary bladder. The proximal aspect of the stent was successfully reconstituted in the renal pelvis. Contrast was injected through the remaining catheter. The ureter stent was successfully draining. The catheter and safety wire were completely removed. Bandage placed over the puncture site.  FINDINGS: The right nephrostomy tube was well positioned in the renal pelvis. There is  blockage of the right ureter in the mid/distal ureter region. Pull-back injection in the ureter demonstrated marked narrowing of the ureter throughout the pelvis and consistent with compression from the rectal mass. The right ureter stent proximal aspect is in the renal pelvis and the distal aspect is in the urinary bladder. The stent was draining at the end of the procedure.  COMPLICATIONS: None  IMPRESSION: Successful placement of a right ureter stent.  Removal of the right nephrostomy tube.   Electronically Signed   By: Markus Daft M.D.   On: 10/18/2013 17:42    Anti-infectives: Anti-infectives   Start     Dose/Rate Route Frequency Ordered Stop   10/18/13 1400  ciprofloxacin (CIPRO) IVPB 400 mg     400 mg 200 mL/hr over 60 Minutes Intravenous On call 10/18/13 1024 10/18/13 1625   10/09/13 2130  ciprofloxacin (CIPRO) IVPB 400 mg  Status:  Discontinued     400 mg 200 mL/hr over 60 Minutes Intravenous Every 12 hours 10/09/13 2108 10/16/13 0750   10/09/13 2130  metroNIDAZOLE (FLAGYL) IVPB 500 mg  Status:  Discontinued     500 mg 100 mL/hr over 60 Minutes Intravenous Every 8 hours 10/09/13 2109 10/16/13 0750   10/09/13 2045  ciprofloxacin (CIPRO) IVPB 400 mg  Status:  Discontinued     400 mg 200 mL/hr over 60 Minutes Intravenous Every 12 hours 10/09/13 2041 10/09/13 2103   10/09/13 2045  metroNIDAZOLE (FLAGYL) IVPB 500 mg  Status:  Discontinued     500 mg 100 mL/hr over 60 Minutes Intravenous Every 8 hours 10/09/13 2042 10/09/13 2109      Assessment/Plan: s/p Procedure(s): LAP ASSISTED  LOOP COLOSTOMY Obstructing rectal cancer. Ureteral obstruction status post stent Doing well without apparent complication. Appears ready for discharge from surgical perspective.    LOS: 11 days    Edward Jolly 10/20/2013

## 2013-10-20 NOTE — Discharge Summary (Signed)
Physician Discharge Summary  Grant [name] IWL:[id number] DOB: 1956/03/16 DOA: 10/09/2013  PCP: No PCP Per Patient  Admit date: 10/09/2013 Discharge date: 10/20/2013  Time spent: 40 minutes  Recommendations for Outpatient Follow-up:  Discharge home [facility] Patient will follow up with oncology Dr. Learta Codding on 6/26 at 1:30 pm and with surgery Dr Ninfa Linden in 2 weeks  Discharge Diagnoses:  Principal Problem:   Adenocarcinoma of colon  Active Problems:   Colovesical fistula   Ureteral obstruction, right   Severe protein-calorie malnutrition   Microcytic anemia   Rectal mass   Discharge Condition: fair  Diet recommendation: regular with supplements  Filed Weights   10/16/13 0500 10/17/13 0515 10/18/13 0514  Weight: 68.493 kg (151 lb) 67.5 kg (148 lb 13 oz) 71.668 kg (158 lb)    History of present illness:  Please refer to admission H&P for details, but in brief, 58 year old male with no significant past medical history who presented to [facility] ED 10/09/2013 with reports of intermittent diarrhea, significant weight loss of about 20 pounds in last month, unintentional. He did have intentional 150 pound weight loss over past few years. In ED, patient had CT abdomen and pelvis and chest and was found to have large mass in the rectosigmoid colon concerning for locally invasive colorectal cancer likely with contained perforation. There was also gas seen within the urinary bladder concerning for colovesical fistula. Small bilateral lower lobe pulmonary nodules were also seen. Pt underwent colonoscopy and right PCN placement with biopsy on 529/2015. Patient was started on TPN 10/14/2013 for nutritional support. diverting colostomy done on 10/15/2013.    Hospital Course:  Locally advanced rectal cancer and possible contained perforation  Pt underwent colonoscopy on 10/11/2013. Patient was seen and evaluated by surgery and recommend diverting colostomy which was done on 6/2. Tolerated sx well. -Pain  stable on oral percocet. Will add when necessary Motrin as well. Will discharged on pain medications. CEA was 3.7 WNL, CA 19-9 is 10.3 (low), CEA 125 is 7.2 (WNL), AFP is 1.7 (WNL), pre-albumin is low at 5.5.  Dr. Annamaria Boots of oncology has seen and evaluated the pt. Rectal bx shows adenocarcinoma.Plan on chemotherapy as oupt. Has appt with Dr Benay Spice on 11/08/13 at 1:30 pm.  Active Problems:  Large pelvic mass with rectal cancer and possible bladder and right ureteral obstruction  Patient was seen and evaluated by urology. 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  -Right percutaneous nephrostomy tube placed by IR on 5/29. A CT cystogram was done on 6/4 following diversion colostomy which showed no colovesical fistula. antegrade right ureteral stent  placed by IR 6/5 and right percutaneous nephrostomy tube was removed. Patient tolerated procedure well and has good urine output.   E.Coli UTI  E.Coli sensitive to Cipro . completed 7 days of cipro and flagyl .  Acute blood loss anemia  Likely secondary to advanced colorectal cancer and iron deficiency anemia. Has received total of 4 units PRBC blood transfusion since admission.  Hemoglobin remains stable at present .  -Prescribe iron supplements.  Severe protein calorie malnutrition  significant weight loss of 20 pounds in the past one month . Low albumin and prealbumin. Received TNA for nutritional support.  Tolerating a regular diet now. Added supplements   Code Status: full code  Family Communication: none at bedside  Disposition Plan: Patient clinically improving. Will be discharged in a.m.    Consultants:  [place]  IR  Oncology  Urology   Procedures:  Right percutaneous nephrostomy by IR on 5/29  Diversion colostomy on 6 /2  anterograde right ureteral stent placement followed by removal of Right percutaneous nephrostomy by IR on 6/5    Antibiotics:  Completed 7 days of IV cipro     Discharge Exam: Filed Vitals:    10/20/13 0514  BP: 138/77  Pulse: 85  Temp: 98.4 F (36.9 C)  Resp: 18    General: Middle aged male in NAD  HEENT: no pallor, moist mucosa  Cardiovascular: NS1&S2, no murmurs, rubs or gallop  Chest: clear b/l, no added sounds ABD: soft, ND, BS +, colostomy in place, right percutaneous nephrostomy removal site stable  Ext: Warm, no edema  CNS: AAOX3   Discharge Instructions You were cared for by a hospitalist during your hospital stay. If you have any questions about your discharge medications or the care you received while you were in the hospital after you are discharged, you can call the unit and asked to speak with the hospitalist on call if the hospitalist that took care of you is not available. Once you are discharged, your primary care physician will handle any further medical issues. Please note that NO REFILLS for any discharge medications will be authorized once you are discharged, as it is imperative that you return to your primary care physician (or establish a relationship with a primary care physician if you do not have one) for your aftercare needs so that they can reassess your need for medications and monitor your lab values.     Medication List    STOP taking these medications       ALEVE PO      TAKE these medications       ALPRAZolam 0.5 MG tablet  Commonly known as:  XANAX  Take 1 tablet (0.5 mg total) by mouth 2 (two) times daily as needed for anxiety.     ferrous sulfate 325 (65 FE) MG tablet  Take 1 tablet (325 mg total) by mouth 2 (two) times daily with a meal.     ibuprofen 600 MG tablet  Commonly known as:  ADVIL,MOTRIN  Take 1 tablet (600 mg total) by mouth every 6 (six) hours as needed for moderate pain.     lactose free nutrition Liqd  Take 237 mLs by mouth 3 (three) times daily with meals.     multivitamin with minerals Tabs tablet  Take 1 tablet by mouth daily.     oxyCODONE-acetaminophen 5-325 MG per tablet  Commonly known as:   PERCOCET/ROXICET  Take 2 tablets by mouth every 4 (four) hours as needed for severe pain.       Allergies  Allergen Reactions  . Strawberry Anaphylaxis and Hives  . Sunflower Oil Anaphylaxis and Hives    seeds  . Watermelon [Citrullus Vulgaris] Anaphylaxis and Hives       Follow-up Information   Follow up with [facility], MD. Schedule an appointment as soon as possible for a visit in 3 weeks.   Specialty:  General Surgery   Contact information:   [address] [place] [address] [phone]       Follow up with Betsy Coder, MD On 11/08/2013. (1:30 pm)    Specialty:  Oncology   Contact information:   [place] [address] [phone]        The results of significant diagnostics from this hospitalization (including imaging, microbiology, ancillary and laboratory) are listed below for reference.    Significant Diagnostic Studies: Ct Chest Wo Contrast  10/09/2013  CLINICAL DATA:  Pulmonary nodules.  Colon cancer.  EXAM: CT CHEST WITHOUT CONTRAST  TECHNIQUE: Multidetector CT imaging of the chest was performed following the standard protocol without IV contrast.  COMPARISON:  CT scan dated 10/09/2013.  FINDINGS: There is an 8 x 4 mm nodule in the right upper lobe anteriorly on image 21 of series 3. There is a 5 mm nodule at the right lung base posteriorly on image 59 of series 3. There is a 6 mm nodule at the left lung base on image 54 of series 3 adjacent to the diaphragm. There is a vague area of multiple subtle nodularity in the left lower lobe laterally on images 49 through 52 which I suspect is inflammatory or postinflammatory. Heart and other mediastinal structures are normal. No hilar or mediastinal adenopathy. No osseous abnormality.  IMPRESSION: There are 3 indeterminate pulmonary nodules as well as what is probably some minimal postinflammatory nodularity in the left lower lobe. Follow-up CT scan without contrast in 3 months  is recommended given this patient's known colon cancer.   Electronically Signed   By: Rozetta Nunnery M.D.   On: 10/09/2013 20:01   Ct Pelvis Wo Contrast  10/18/2013   CLINICAL DATA:  Cystogram to evaluate for colovesicular fistula.  EXAM: CT PELVIS WITHOUT CONTRAST  TECHNIQUE: Multidetector CT imaging of the pelvis was performed following the standard protocol without intravenous contrast. Prior to imaging, 300 cc of Isovue-300 was injected into the bladder.  COMPARISON:  Abdominal CT 10/09/2013  FINDINGS: 300 cc of iodinated contrast was injected into the bladder. There is good distention of the urinary bladder. No contrast is seen exiting the bladder into the perivesicular space or colon. High-density stool within the anus is likely residual from comparison CT, which used oral contrast. Undulating luminal contour of the posterior bladder wall is likely mucosal redundancy as there was no clear mural invasion on the preceding enhanced CT. A large colorectal adenocarcinoma has a grossly stable appearance. No development of fluid collection.  New extraluminal gas in the left lower quadrant related to diverting colostomy.  Moderate bilateral hip osteoarthritis. Lower lumbar facet osteoarthritis causing advanced spinal canal stenosis at L4-5.  IMPRESSION: No colovesicular fistula identified.   Electronically Signed   By: Jorje Guild M.D.   On: 10/18/2013 02:23   Ct Abdomen Pelvis W Contrast  10/09/2013   CLINICAL DATA:  Weight loss, diarrhea.  EXAM: CT ABDOMEN AND PELVIS WITH CONTRAST  TECHNIQUE: Multidetector CT imaging of the abdomen and pelvis was performed using the standard protocol following bolus administration of intravenous contrast.  CONTRAST:  168mL OMNIPAQUE IOHEXOL 300 MG/ML  SOLN  COMPARISON:  None.  FINDINGS: 5 mm nodule noted in the right lower lobe posteriorly, nonspecific. Triangular nodular density at the left base along the left hemidiaphragm on image 9 measures 7 mm. No effusions. Heart is  normal size.  There is a large heterogeneous complex mass in the pelvis in the region of the rectosigmoid colon. This measures approximately 10.2 x 9.1 cm. Extraluminal locules of gas are noted posterior to the bladder concerning for a contained rupture. There is gas layering within the urinary bladder. This may be root related to recent catheterization. If the patient has not been catheterized, this would be concerning for colovesical fistula. There is right hydronephrosis and perinephric stranding with delayed excretion of contrast from the right kidney. The right ureter is dilated into the pelvis where it appears to be encased by the large rectosigmoid mass.  No evidence  of a hepatic metastases. Tiny hypodensity in the dome of the liver is noted and nonspecific but most likely represents a small cyst. Spleen, pancreas, adrenals and left kidney are unremarkable. Small bowel is decompressed. Moderate stool throughout the colon, but no definitive evidence for colonic obstruction at this time.  No acute bony abnormality or focal bone lesion.  IMPRESSION: Large mass in the rectosigmoid colon concerning for locally invasive colorectal cancer, likely with contained perforation. This appears to encase/ involve the mid to distal right ureter with right hydronephrosis/obstruction.  Gas within the urinary bladder, question catheterization. If the patient has not been catheterized, this would be concerning for colovesical fistula.  Small bilateral lower lobe pulmonary nodules, nonspecific. Recommend attention on followup imaging.  These results were called by telephone at the time of interpretation on 10/09/2013 at 5:07 PM to Dr. Tawnya Crook, who verbally acknowledged these results.   Electronically Signed   By: Rolm Baptise M.D.   On: 10/09/2013 17:08   Ir Nephrostogram Right  10/18/2013   CLINICAL DATA:  58 year old with a large rectal mass and right ureter obstruction. The patient has a right percutaneous nephrostomy tube.  Plan for placement of an antegrade ureter stent.  EXAM: PLACEMENT OF ANTEGRADE RIGHT URETER STENT; RIGHT NEPHROSTOGRAM  Physician: Stephan Minister. Henn, MD  FLUOROSCOPY TIME:  5 min and 18 seconds  MEDICATIONS: 2.5 mg versed, 100 mcg fentanyl. A radiology nurse monitored the patient for moderate sedation.  ANESTHESIA/SEDATION: Moderate sedation time: 25 min  PROCEDURE: The procedure was explained to the patient. The risks and benefits of the procedure were discussed and the patient's questions were addressed. Informed consent was obtained from the patient. The patient was placed prone. The right flank and catheter were prepped and draped in a sterile fashion. Maximal barrier sterile technique was utilized including caps, mask, sterile gowns, sterile gloves, sterile drape, hand hygiene and skin antiseptic. The skin around the catheter was anesthetized with 1% lidocaine. The catheter suture was removed. Contrast was injected through the right nephrostomy tube and a nephrostogram was performed. The catheter was removed over a Bentson wire. A 5 French Kumpe catheter was advanced into the right ureter. Additional images were obtained of the right ureter. Catheter and wire were successfully advanced into the urinary bladder. A stiff Amplatz wire was placed. The 5 French catheter was exchanged for a 7 [place] vascular sheath. The Bentson wire was placed through the sheath as a safety wire. A 8 French x 28 cm ureter stent was selected. The stent was advanced over the Amplatz wire. The distal aspect of the stent was reconstituted in the urinary bladder. The proximal aspect of the stent was successfully reconstituted in the renal pelvis. Contrast was injected through the remaining catheter. The ureter stent was successfully draining. The catheter and safety wire were completely removed. Bandage placed over the puncture site.  FINDINGS: The right nephrostomy tube was well positioned in the renal pelvis. There is blockage of the right  ureter in the mid/distal ureter region. Pull-back injection in the ureter demonstrated marked narrowing of the ureter throughout the pelvis and consistent with compression from the rectal mass. The right ureter stent proximal aspect is in the renal pelvis and the distal aspect is in the urinary bladder. The stent was draining at the end of the procedure.  COMPLICATIONS: None  IMPRESSION: Successful placement of a right ureter stent.  Removal of the right nephrostomy tube.   Electronically Signed   By: Markus Daft M.D.   On:  10/18/2013 17:42   Ir Perc Nephrostomy Right  10/11/2013   INDICATION: History of obstructive rectal mass with associated mild right-sided ureterectasis and pelvicaliectasis, not suitable for retrograde ureteral stent placement per urologic consultation. Please perform ultrasound and fluoroscopic guided percutaneous nephrostomy catheter placement to preserve right-sided renal function and avoid suspected impending obstructive uropathy.  EXAM: 1. ULTRASOUND GUIDANCE FOR PUNCTURE OF THE RIGHT RENAL COLLECTING SYSTEM 2. RIGHT PERCUTANEOUS NEPHROSTOMY TUBE PLACEMENT.  COMPARISON:  CT abdomen pelvis - 10/09/2013  MEDICATIONS: The patient is currently admitted to the hospital and receiving intravenous antibiotics. The antibiotic was administered in an appropriate time frame prior to skin puncture.  ANESTHESIA/SEDATION: Fentanyl 175 mcg IV; Versed 3.5 mg IV  Total Moderate Sedation Time  30 minutes.  CONTRAST:  20 mL Isovue 300 administered into the collecting system  FLUOROSCOPY TIME:  3 minutes 48 seconds.  COMPLICATIONS: None immediate  PROCEDURE: The procedure, risks, benefits, and alternatives were explained to the patient. Questions regarding the procedure were encouraged and answered. The patient understands and consents to the procedure. A timeout was performed prior to the initiation of the procedure.  The right flank region was prepped with Betadine in a sterile fashion, and a sterile drape  was applied covering the operative field. A sterile gown and sterile gloves were used for the procedure. Local anesthesia was provided with 1% Lidocaine with epinephrine. Ultrasound was used to localize the right kidney. Under direct ultrasound guidance, a 21 gauge needle was advanced into the renal collecting system. An ultrasound image documentation was performed. Access within the collecting system was confirmed with the efflux of urine followed by limited contrast injection.  Over a Nitrex wire, the tract was dilated with an Accustick stent. Over a guide wire, a 10-French percutaneous nephrostomy catheter was advanced into the collecting system where the coil was formed and locked. Contrast was injected and several sport radiographs were obtained in various obliquities confirming access. The catheter was secured at the skin with a Prolene retention suture and a gravity bag was placed. A dressing was placed. The patient tolerated procedure well without immediate postprocedural complication.  FINDINGS: Ultrasound scanning demonstrates very mild right-sided pelvicaliectasis, similar to preprocedural abdominal CT.  Under direct ultrasound guidance, a posterior inferior calix was targeted allowing advancement of an 10-French percutaneous nephrostomy catheter under intermittent fluoroscopic guidance. Contrast injection confirmed appropriate positioning.  IMPRESSION: Successful ultrasound and fluoroscopic guided placement of a right sided 10 French PCN.   Electronically Signed   By: Sandi Mariscal M.D.   On: 10/11/2013 18:08   Ir US Guide Bx Asp/drain  10/11/2013   INDICATION: History of obstructive rectal mass with associated mild right-sided ureterectasis and pelvicaliectasis, not suitable for retrograde ureteral stent placement per urologic consultation. Please perform ultrasound and fluoroscopic guided percutaneous nephrostomy catheter placement to preserve right-sided renal function and avoid suspected impending  obstructive uropathy.  EXAM: 1. ULTRASOUND GUIDANCE FOR PUNCTURE OF THE RIGHT RENAL COLLECTING SYSTEM 2. RIGHT PERCUTANEOUS NEPHROSTOMY TUBE PLACEMENT.  COMPARISON:  CT abdomen pelvis - 10/09/2013  MEDICATIONS: The patient is currently admitted to the hospital and receiving intravenous antibiotics. The antibiotic was administered in an appropriate time frame prior to skin puncture.  ANESTHESIA/SEDATION: Fentanyl 175 mcg IV; Versed 3.5 mg IV  Total Moderate Sedation Time  30 minutes.  CONTRAST:  20 mL Isovue 300 administered into the collecting system  FLUOROSCOPY TIME:  3 minutes 48 seconds.  COMPLICATIONS: None immediate  PROCEDURE: The procedure, risks, benefits, and alternatives were explained to the patient.  Questions regarding the procedure were encouraged and answered. The patient understands and consents to the procedure. A timeout was performed prior to the initiation of the procedure.  The right flank region was prepped with Betadine in a sterile fashion, and a sterile drape was applied covering the operative field. A sterile gown and sterile gloves were used for the procedure. Local anesthesia was provided with 1% Lidocaine with epinephrine. Ultrasound was used to localize the right kidney. Under direct ultrasound guidance, a 21 gauge needle was advanced into the renal collecting system. An ultrasound image documentation was performed. Access within the collecting system was confirmed with the efflux of urine followed by limited contrast injection.  Over a Nitrex wire, the tract was dilated with an Accustick stent. Over a guide wire, a 10-French percutaneous nephrostomy catheter was advanced into the collecting system where the coil was formed and locked. Contrast was injected and several sport radiographs were obtained in various obliquities confirming access. The catheter was secured at the skin with a Prolene retention suture and a gravity bag was placed. A dressing was placed. The patient tolerated  procedure well without immediate postprocedural complication.  FINDINGS: Ultrasound scanning demonstrates very mild right-sided pelvicaliectasis, similar to preprocedural abdominal CT.  Under direct ultrasound guidance, a posterior inferior calix was targeted allowing advancement of an 10-French percutaneous nephrostomy catheter under intermittent fluoroscopic guidance. Contrast injection confirmed appropriate positioning.  IMPRESSION: Successful ultrasound and fluoroscopic guided placement of a right sided 10 French PCN.   Electronically Signed   By: Sandi Mariscal M.D.   On: 10/11/2013 18:08   Dg Chest Portable 1 View  10/09/2013   CLINICAL DATA:  Weight loss ever since having the flu earlier this year.  EXAM: PORTABLE CHEST - 1 VIEW  COMPARISON:  None.  FINDINGS: Heart, mediastinum and hila are unremarkable. Lungs are clear. No pleural effusion. No pneumothorax.  Bony thorax is intact.  IMPRESSION: No active disease.   Electronically Signed   By: Lajean Manes M.D.   On: 10/09/2013 14:00   Ir Oris Drone Cath Perc Right  10/18/2013   CLINICAL DATA:  58 year old with a large rectal mass and right ureter obstruction. The patient has a right percutaneous nephrostomy tube. Plan for placement of an antegrade ureter stent.  EXAM: PLACEMENT OF ANTEGRADE RIGHT URETER STENT; RIGHT NEPHROSTOGRAM  Physician: Stephan Minister. Henn, MD  FLUOROSCOPY TIME:  5 min and 18 seconds  MEDICATIONS: 2.5 mg versed, 100 mcg fentanyl. A radiology nurse monitored the patient for moderate sedation.  ANESTHESIA/SEDATION: Moderate sedation time: 25 min  PROCEDURE: The procedure was explained to the patient. The risks and benefits of the procedure were discussed and the patient's questions were addressed. Informed consent was obtained from the patient. The patient was placed prone. The right flank and catheter were prepped and draped in a sterile fashion. Maximal barrier sterile technique was utilized including caps, mask, sterile gowns, sterile  gloves, sterile drape, hand hygiene and skin antiseptic. The skin around the catheter was anesthetized with 1% lidocaine. The catheter suture was removed. Contrast was injected through the right nephrostomy tube and a nephrostogram was performed. The catheter was removed over a Bentson wire. A 5 French Kumpe catheter was advanced into the right ureter. Additional images were obtained of the right ureter. Catheter and wire were successfully advanced into the urinary bladder. A stiff Amplatz wire was placed. The 5 French catheter was exchanged for a 7 [place] vascular sheath. The Bentson wire was placed through the sheath as a  safety wire. A 8 French x 28 cm ureter stent was selected. The stent was advanced over the Amplatz wire. The distal aspect of the stent was reconstituted in the urinary bladder. The proximal aspect of the stent was successfully reconstituted in the renal pelvis. Contrast was injected through the remaining catheter. The ureter stent was successfully draining. The catheter and safety wire were completely removed. Bandage placed over the puncture site.  FINDINGS: The right nephrostomy tube was well positioned in the renal pelvis. There is blockage of the right ureter in the mid/distal ureter region. Pull-back injection in the ureter demonstrated marked narrowing of the ureter throughout the pelvis and consistent with compression from the rectal mass. The right ureter stent proximal aspect is in the renal pelvis and the distal aspect is in the urinary bladder. The stent was draining at the end of the procedure.  COMPLICATIONS: None  IMPRESSION: Successful placement of a right ureter stent.  Removal of the right nephrostomy tube.   Electronically Signed   By: Markus Daft M.D.   On: 10/18/2013 17:42    Microbiology: Recent Results (from the past 240 hour(s))  SURGICAL PCR SCREEN     Status: None   Collection Time    10/14/13  1:40 PM      Result Value Ref Range Status   MRSA, PCR NEGATIVE   NEGATIVE Final   Staphylococcus aureus NEGATIVE  NEGATIVE Final   Comment:            The Xpert SA Assay (FDA     approved for NASAL specimens     in patients over 2 years of age),     is one component of     a comprehensive surveillance     program.  Test performance has     been validated by [employer] for patients greater     than or equal to 9 year old.     It is not intended     to diagnose infection nor to     guide or monitor treatment.     Labs: Basic Metabolic Panel:  Recent Labs Lab 10/14/13 0500 10/15/13 1330 10/17/13 0440  NA 137 137 137  K 4.1 4.4 4.4  CL 101 99 104  CO2 26 29 26   GLUCOSE 117* 129* 91  BUN 5* 12 12  CREATININE 0.94 0.94 0.97  CALCIUM 8.4 8.9 8.6  MG 2.0  --   --   PHOS 3.2  --   --    Liver Function Tests:  Recent Labs Lab 10/14/13 0500  AST 11  ALT 8  ALKPHOS 55  BILITOT 0.3  PROT 5.6*  ALBUMIN 2.0*   No results found for this basename: LIPASE, AMYLASE,  in the last 168 hours No results found for this basename: AMMONIA,  in the last 168 hours CBC:  Recent Labs Lab 10/14/13 0500  10/16/13 0540 10/17/13 0440 10/18/13 0528 10/19/13 0545 10/20/13 0455  WBC 6.2  < > 6.1 5.3 5.2 9.0 6.0  NEUTROABS 4.7  --   --   --   --   --   --   HGB 8.9*  < > 9.7* 9.1* 9.0* 10.2* 8.6*  HCT 28.6*  < > 31.3* 30.1* 30.2* 34.0* 29.1*  MCV 70.4*  < > 71.6* 72.0* 72.9* 72.2* 72.9*  PLT 390  < > 398 381 320 430* 313  < > = values in this interval not displayed. Cardiac Enzymes: No results found  for this basename: CKTOTAL, CKMB, CKMBINDEX, TROPONINI,  in the last 168 hours BNP: BNP (last 3 results) No results found for this basename: PROBNP,  in the last 8760 hours CBG:  Recent Labs Lab 10/15/13 1212 10/15/13 1546 10/15/13 2141 10/16/13 0430 10/16/13 0944  GLUCAP 161* 135* 150* 133* 211*       Signed:  Patryce Depriest  [facility] 10/20/2013, 1:34 PM

## 2013-10-28 NOTE — Anesthesia Postprocedure Evaluation (Signed)
  Anesthesia Post-op Note  Patient: Grant [name]  Procedure(s) Performed: Procedure(s): COLONOSCOPY (N/A)  Patient Location: PACU and Endoscopy Unit  Anesthesia Type:General  Level of Consciousness: awake, oriented and patient cooperative  Airway and Oxygen Therapy: Patient Spontanous Breathing  Post-op Pain: none  Post-op Assessment: Post-op Vital signs reviewed, Patient's Cardiovascular Status Stable, Respiratory Function Stable, Patent Airway, No signs of Nausea or vomiting and Pain level controlled  Post-op Vital Signs: stable  Last Vitals:  Filed Vitals:   10/20/13 0514  BP: 138/77  Pulse: 85  Temp: 36.9 C  Resp: 18    Complications: No apparent anesthesia complications

## 2013-10-30 ENCOUNTER — Telehealth (INDEPENDENT_AMBULATORY_CARE_PROVIDER_SITE_OTHER): Payer: Self-pay

## 2013-10-30 ENCOUNTER — Other Ambulatory Visit: Payer: Self-pay | Admitting: Internal Medicine

## 2013-10-30 NOTE — Telephone Encounter (Signed)
Pt is post op loop colostomy by Dr. Ninfa Linden.  His wife is calling to report he has had a temp 99.0 - 101.0 range since yesterday morning.  Tylenol reduces it but it comes back after several hours.  Pt has chills and generally "feels bad".  Bowels and bladder functioning.  Advised pt's wife to call PCP.  They do not have a PCP, so advised them to go to the ED.

## 2013-11-01 ENCOUNTER — Telehealth: Payer: Self-pay | Admitting: *Deleted

## 2013-11-01 NOTE — Telephone Encounter (Signed)
Spoke with patient by phone and confirmed appointment with Dr. Benay Spice for 11/08/13.  Contact names, numbers, and directions were provided.

## 2013-11-07 ENCOUNTER — Ambulatory Visit: Payer: Self-pay | Attending: Internal Medicine

## 2013-11-08 ENCOUNTER — Telehealth: Payer: Self-pay

## 2013-11-08 ENCOUNTER — Ambulatory Visit: Payer: Self-pay

## 2013-11-08 ENCOUNTER — Telehealth: Payer: Self-pay | Admitting: Oncology

## 2013-11-08 ENCOUNTER — Encounter: Payer: Self-pay | Admitting: Oncology

## 2013-11-08 ENCOUNTER — Other Ambulatory Visit: Payer: Self-pay | Admitting: *Deleted

## 2013-11-08 ENCOUNTER — Ambulatory Visit (HOSPITAL_BASED_OUTPATIENT_CLINIC_OR_DEPARTMENT_OTHER): Payer: Self-pay | Admitting: Oncology

## 2013-11-08 ENCOUNTER — Encounter: Payer: Self-pay | Admitting: *Deleted

## 2013-11-08 VITALS — BP 121/76 | HR 114 | Temp 98.9°F | Resp 18 | Ht 76.0 in | Wt 146.8 lb

## 2013-11-08 DIAGNOSIS — Z800 Family history of malignant neoplasm of digestive organs: Secondary | ICD-10-CM

## 2013-11-08 DIAGNOSIS — Z933 Colostomy status: Secondary | ICD-10-CM

## 2013-11-08 DIAGNOSIS — C20 Malignant neoplasm of rectum: Secondary | ICD-10-CM

## 2013-11-08 DIAGNOSIS — R634 Abnormal weight loss: Secondary | ICD-10-CM

## 2013-11-08 DIAGNOSIS — D509 Iron deficiency anemia, unspecified: Secondary | ICD-10-CM

## 2013-11-08 DIAGNOSIS — R918 Other nonspecific abnormal finding of lung field: Secondary | ICD-10-CM

## 2013-11-08 DIAGNOSIS — R630 Anorexia: Secondary | ICD-10-CM

## 2013-11-08 MED ORDER — OXYCODONE-ACETAMINOPHEN 5-325 MG PO TABS: 2.0000 | ORAL_TABLET | Freq: Four times a day (QID) | ORAL | Status: DC | PRN

## 2013-11-08 MED ORDER — ALPRAZOLAM 0.5 MG PO TABS: 0.5000 mg | ORAL_TABLET | Freq: Two times a day (BID) | ORAL | Status: DC | PRN

## 2013-11-08 NOTE — Progress Notes (Signed)
[facility] Patient Consult   Referring MD: Zakkery Dorian 58 y.o.  08-Dec-1955    Reason for Referral: Rectal cancer   HPI: He presented to the emergency room 10/09/2013 with diarrhea and weight loss. He had a recent 20 pound unintentional weight loss. He was noted to be anemic. A CT the abdomen and pelvis confirmed a large mass in the rectosigmoid colon with encasement of the right ureter, right hydronephrosis, and a possible colovesical fistula. Bilateral lower lobe pulmonary nodules were also seen. Dr. Henrene Pastor was consulted and he was taken to a colonoscopy procedure 10/11/2013. The lumen was occluded with a large malignant-appearing mass in the rectum at 9 cm from the anal verge. The scope could not be passed beyond the mass. A biopsy ([id number]) confirmed adenocarcinoma. Dr. Alinda Money was consulted. A right percutaneous nephrostomy tube was placed. A right ureter stent was placed 10/18/2013 and the nephrostomy tube was removed. A CT cystogram revealed no definite colovesical fistula.  Dr. Ninfa Linden was consult at and he was taken to the operating room for a laparoscopic assisted descending loop colostomy 10/15/2013.  He was discharged 10/20/2013. He reports feeling better. The colostomy is functioning. He passes small amounts of liquid from the rectum.      Past Medical History  Diagnosis Date  .  rectal cancer-obstructing mass 9 cm from the anal verge   10/11/2013     Past Surgical History  Procedure Laterality Date  . Mandible surgery  1983  . Colonoscopy N/A 10/11/2013    Procedure: COLONOSCOPY;  Surgeon: [name], MD;  Location: [facility];  Service: Endoscopy;  Laterality: N/A;  . Colon resection N/A 10/15/2013    Procedure: LAP ASSISTED  LOOP COLOSTOMY;  Surgeon: Harl Bowie, MD;  Location: [facility];  Service: General;  Laterality: N/A;    Medications: Reviewed  Allergies:  Allergies  Allergen Reactions  . Strawberry  Anaphylaxis and Hives  . Sunflower Oil Anaphylaxis and Hives    seeds  . Watermelon [Citrullus Vulgaris] Anaphylaxis and Hives    Family history: His father died of pancreatic cancer dates 53, a 1/2 maternal aunt died of colon cancer in her 28s. He is an only child. No other family history of cancer.  Social History:   He lives in [place]. He is an unemployed Training and development officer. He quit smoking cigarettes in the 1990s. He quit drinking alcohol 4 years ago. He uses snuff. He was transfused during the recent hospital admission. No risk factor for HIV or hepatitis.   ROS:   Positives include: Intentional 150 pound weight loss with dieting several years ago, recent 30 pound unintentional weight loss. Diarrhea prior to surgery, low-grade fever up to 100 for one week following surgery  A complete ROS was otherwise negative.  Physical Exam:  Blood pressure 121/76, pulse 114, temperature 98.9 F (37.2 C), temperature source Oral, resp. rate 18, height 6\' 4"  (1.93 m), weight 146 lb 12.8 oz (66.588 kg), SpO2 100.00%.  HEENT: Poor dentition, oropharynx without visible mass, neck without mass Lungs: Distant breath sounds, clear bilaterally Cardiac: Regular rate and rhythm Abdomen: No hepatosplenomegaly, left lower quadrant colostomy with formed stool, tender in the low mid abdomen, no mass GU: Soft fullness at the left epididymis (she reports this is a chronic finding) , testes without mass  Vascular: No leg edema Lymph nodes: No cervical, supraclavicular, or axillary nodes. "Shotty "bilateral inguinal nodes Neurologic: Alert and oriented, the motor exam appears intact in the  upper and lower extremities Skin: No rash, dry skin over the back Musculoskeletal: No spine tenderness   LAB:  CBC  Lab Results  Component Value Date   WBC 6.0 10/20/2013   HGB 8.6* 10/20/2013   HCT 29.1* 10/20/2013   MCV 72.9* 10/20/2013   PLT 313 10/20/2013   NEUTROABS 4.7 10/14/2013     CMP      Component Value  Date/Time   NA 137 10/17/2013 0440   K 4.4 10/17/2013 0440   CL 104 10/17/2013 0440   CO2 26 10/17/2013 0440   GLUCOSE 91 10/17/2013 0440   BUN 12 10/17/2013 0440   CREATININE 0.97 10/17/2013 0440   CALCIUM 8.6 10/17/2013 0440   PROT 5.6* 10/14/2013 0500   ALBUMIN 2.0* 10/14/2013 0500   AST 11 10/14/2013 0500   ALT 8 10/14/2013 0500   ALKPHOS 55 10/14/2013 0500   BILITOT 0.3 10/14/2013 0500   GFRNONAA >90 10/17/2013 0440   GFRAA >90 10/17/2013 0440    Lab Results  Component Value Date   CEA 3.7 10/10/2013    Imaging:  I reviewed the 10/09/2013 chest, abdomen, and pelvic CTs with Mr. Lindblad and his friends. There is a large rectal mass, air in the bladder, and bilateral small lung nodules   Assessment/Plan:   1. Rectal cancer-large pelvic mass with a colonoscopy 10/11/2013 confirming an obstructing tumor at 9 cm from the anal verge  CTs of the chest, abdomen, and revealed a large complex pelvic mass, gas in the bladder, loculated gas posterior to the bladder, right hydronephrosis, and small bilateral indeterminant lung nodules 2. Right hydronephrosis secondary to the obstructing pelvic mass, status post placement of a right percutaneous nephrostomy tube followed by a right ureter stent  3.   microcytic anemia-likely iron deficiency anemia secondary to bleeding from the rectal tumor-now taking iron  4.   possible colovesical fistula-on the CT 10/09/2013 evaluated by Dr. Alinda Money, followup CT cystogram 10/18/2013 revealed no fistula  5.   anorexia/weight loss secondary to #1  6.   indeterminate lung nodules on a CT of the chest 10/09/2013  7.   family history of pancreas and colon cancer  Disposition:   Mr. Shurley has been diagnosed with locally advanced rectal cancer. There are indeterminate lung nodules that may represent metastatic disease. I discussed treatment options with Mr. Shough and his family. We reviewed the CT images. He understands resection of the primary tumor would require an  extensive procedure with the potential for resection/reconstruction of the bladder and ureter.  I recommend neoadjuvant chemotherapy and radiation. He will be referred to Dr. Lisbeth Renshaw. I recommend capecitabine to be given concurrent with radiation. We reviewed the potential toxicities associated with capecitabine including the chance for mucositis, diarrhea, and hematologic toxicity. We discussed the rash, hyperpigmentation, hand/foot syndrome, and radiation/chemotherapy skin breakdown associated with capecitabine. He agrees to proceed. He will attend a chemotherapy teaching class.  Mr. Raygoza will continue iron. We will check a CBC when he returns for chemotherapy teaching class.  The plan is to begin capecitabine and radiation on approximately 11/25/2013. He will return for an office visit 12/05/2013.  Approximately 50 minutes were spent with the patient today. The majority of the time was used for counseling and coordination of care.  [place], [place] 11/08/2013, 2:30 PM

## 2013-11-08 NOTE — Telephone Encounter (Signed)
Pt is wanting to set up an Est. Care visit.Pt is aware of wait time Pt can be reached at above #

## 2013-11-08 NOTE — Progress Notes (Signed)
Checked in new pt with no insurance.  Pt said he filled out a financial assistance application with Johnnette Barrios yesterday and another rep from the hospital assisted him with filling out a Medicaid application and she will be turning it in for him.  We discussed the drug replacement program and he gave me income information that I will forward to [place].  Pt is not working and was living off of his 401k which is almost depleted so I approved him for the $400 [place] to assist with medication and personal bills.

## 2013-11-08 NOTE — Progress Notes (Signed)
Met with Grant [name] and family. Explained role of nurse navigator. Educational information provided on colorectal cancer  He has a colostomy and was given resource and contact information for [place], [facility], in case [place] services are needed. Referral made to dietician for diet education and SW for assessment. He was seen by SW today.  [place] resources provided to patient, including GI and ostomy support group information.  Referral made to radiation oncology, Dr. Lisbeth Renshaw, and he was given appointment.  Contact names and phone numbers were provided for entire [facility] team.  Teach back method was used.  Will continue to follow as needed.

## 2013-11-08 NOTE — Progress Notes (Signed)
[place] Work  Clinical Social Work was referred by Environmental manager for assessment of psychosocial needs due to new cancer diagnosis and financial concerns.  Clinical Social Worker met with patient, his cousin and lady friend, [name] at [facility] to offer support and assess for needs.   Pt has met with financial counselor and was made aware of grant funds, gas card etc. CSW explained CSW role and Pt and Family Support Team members/resources to assist him. Pt and family pleased with supports available and agree to reach out as needed. They all expressed they were very happy with the team approach to care.    Clinical Social Work interventions: Resource education Supportive listening and emotional support  Loren Racer, LCSW Clinical Social Worker [facility] [place] for [place] Wednesday, Thursday and Friday Phone: [phone] Fax: [phone]

## 2013-11-11 ENCOUNTER — Encounter (INDEPENDENT_AMBULATORY_CARE_PROVIDER_SITE_OTHER): Payer: Self-pay | Admitting: Surgery

## 2013-11-11 ENCOUNTER — Ambulatory Visit (INDEPENDENT_AMBULATORY_CARE_PROVIDER_SITE_OTHER): Payer: Self-pay | Admitting: Surgery

## 2013-11-11 ENCOUNTER — Encounter: Payer: Self-pay | Admitting: Oncology

## 2013-11-11 VITALS — BP 124/76 | HR 110 | Temp 98.0°F | Ht 76.0 in | Wt 146.0 lb

## 2013-11-11 DIAGNOSIS — Z09 Encounter for follow-up examination after completed treatment for conditions other than malignant neoplasm: Secondary | ICD-10-CM

## 2013-11-11 NOTE — Progress Notes (Signed)
[place], [id number], about xeloda assistance for uninsured patients.  Per the rep the company no longer offers xeloda assistance for uninsured or insured because they feel there are other drugs on the market that does the same thing.  She states that in the near future another drug is coming out that they will offer assistance for and is similar to xeloda.

## 2013-11-11 NOTE — Progress Notes (Signed)
GU Location of Tumor / Histology: Rectosigmoid colon mass, 9cm from tthe anal verge  Keevin Panebianco presented  months ago with signs/symptoms of: weight loss ,fecal incontinence,loose intermittent stools,thin anemic   Biopsies of  (if applicable) revealed: Diagnosis 10/11/13: Rectum, biopsy- ADENOCARCINOMA.   Past/Anticipated interventions by urology, if any:  Dr.Borden place a  Right  Percutaneous nephrostomy tube and stent 10/18/13, and the nephrostomy tube was removed   Past/Anticipated interventions by medical oncology, if any: Dr.Sherrill seen  On 11/08/13,follow up visit 12/03/13, chemo education 11/13/13,   Weight changes, if any: 25 lb wt.loss past month, has consult with Pamala Hurry [place],11/12/13,dietician   Bowel/Bladder complaints, if any: loose stools, flatus, has colostomy,still passes small amts liquid from rectum  Nausea/Vomiting, if any:   Pain issues, if any:   SAFETY ISSUES:  Prior radiation? NO  Pacemaker/ICD? NO  Is the patient on methotrexate? 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 Ninfa Linden performed a laparoscopic assisted descending loop colostomy 10/15/13,d/c hospital  06-Nov-2013  Father died pancreatic cancer age 65, 1/2 maternal aunt died of colon cancer in her 65's,

## 2013-11-11 NOTE — Progress Notes (Signed)
Subjective:     Patient ID: Grant [name], male   DOB: Mar 20, 1956, 58 y.o.   MRN: [id number]  HPI He is here for his first postoperative visit status post a laparoscopic-assisted loop descending colostomy for this very large rectal cancer. He is doing well and has no complaints currently. He'll be seeing radiation oncology see if he'll be starting treatment with chemoradiation in about 2 weeks.  Review of Systems     Objective:   Physical Exam On exam, his ostomy is pink and well-perfused. I removed the bar underneath and replaced in the ostomy appliance. His abdomen is soft and nontender and his incisions are healed    Assessment:     Patient stable postop     Plan:     I will have him followup with Dr. Geanie Berlin in approximately 2 months given the fact this is a very large complex tumor.

## 2013-11-12 ENCOUNTER — Ambulatory Visit: Payer: Self-pay | Admitting: Nutrition

## 2013-11-12 ENCOUNTER — Telehealth: Payer: Self-pay | Admitting: General Practice

## 2013-11-12 NOTE — Progress Notes (Signed)
58 year old male diagnosed with rectal cancer status post colostomy.  History includes fecal incontinence.  Medications include Xanax, ferrous sulfate, and multivitamin.  Labs include a CBG of 211 on June 3.  Height: 6 feet 4 inches. Weight: 146 pounds June 29. Usual body weight: 180-200 pounds. BMI: 17.78.  Patient denies nutrition side effects.  He is healing after colostomy.  He does have increased pain.  Plan is for chemoradiation therapy.  Patient consumes high-protein Boost 2-3 times daily.  Patient is underweight with BMI of 17.78.    Patient meets criteria for severe malnutrition in the context of acute illness secondary to 19% weight loss from usual body weight and severe depletion of body fat and muscle mass.  Nutrition diagnosis: Unintended weight loss related to diagnosis of rectal cancer as evidenced by 19% weight loss from usual body weight.  Intervention: Patient was educated on importance of smaller, more frequent meals and snacks utilizing, high-calorie, high-protein foods as tolerated.  Reviewed foods that may cause increased gas or odors.  Also reviewed foods patient should avoid secondary to potential for bowel blockage.  Recommended patient change oral nutrition supplements to boost plus or Ensure Plus 3 times a day to provide additional calories to promote weight gain.  Provided patient and wife with fact sheets.  Gave them samples of both Ensure Plus and boost plus with coupons.  Contact information was given.  Questions were answered and teach back method was used.  Monitoring, evaluation, goals: Patient will tolerate increased calories and protein to promote healing and weight gain with minimal nutrition side effects.  Next visit: I will followup with patient once treatment plan is scheduled.

## 2013-11-12 NOTE — Telephone Encounter (Signed)
Left voicemail for patient to call to establish care.

## 2013-11-13 ENCOUNTER — Other Ambulatory Visit: Payer: Self-pay | Admitting: *Deleted

## 2013-11-13 ENCOUNTER — Encounter: Payer: Self-pay | Admitting: *Deleted

## 2013-11-13 ENCOUNTER — Ambulatory Visit: Payer: Self-pay

## 2013-11-13 ENCOUNTER — Other Ambulatory Visit (HOSPITAL_BASED_OUTPATIENT_CLINIC_OR_DEPARTMENT_OTHER): Payer: Self-pay

## 2013-11-13 DIAGNOSIS — C20 Malignant neoplasm of rectum: Secondary | ICD-10-CM

## 2013-11-13 LAB — COMPREHENSIVE METABOLIC PANEL (CC13)
ALBUMIN: 2.7 g/dL — AB (ref 3.5–5.0)
ALT: 9 U/L (ref 0–55)
ANION GAP: 10 meq/L (ref 3–11)
AST: 9 U/L (ref 5–34)
Alkaline Phosphatase: 77 U/L (ref 40–150)
BUN: 14.9 mg/dL (ref 7.0–26.0)
CO2: 27 mEq/L (ref 22–29)
Calcium: 9.7 mg/dL (ref 8.4–10.4)
Chloride: 97 mEq/L — ABNORMAL LOW (ref 98–109)
Creatinine: 1 mg/dL (ref 0.7–1.3)
GLUCOSE: 194 mg/dL — AB (ref 70–140)
POTASSIUM: 4.7 meq/L (ref 3.5–5.1)
SODIUM: 134 meq/L — AB (ref 136–145)
Total Bilirubin: 0.37 mg/dL (ref 0.20–1.20)
Total Protein: 7.3 g/dL (ref 6.4–8.3)

## 2013-11-13 LAB — CBC WITH DIFFERENTIAL/PLATELET
BASO%: 0.5 % (ref 0.0–2.0)
Basophils Absolute: 0.1 10*3/uL (ref 0.0–0.1)
EOS%: 0.3 % (ref 0.0–7.0)
Eosinophils Absolute: 0 10*3/uL (ref 0.0–0.5)
HCT: 29.5 % — ABNORMAL LOW (ref 38.4–49.9)
HGB: 9.3 g/dL — ABNORMAL LOW (ref 13.0–17.1)
LYMPH%: 4.6 % — AB (ref 14.0–49.0)
MCH: 23 pg — AB (ref 27.2–33.4)
MCHC: 31.7 g/dL — AB (ref 32.0–36.0)
MCV: 72.7 fL — ABNORMAL LOW (ref 79.3–98.0)
MONO#: 0.4 10*3/uL (ref 0.1–0.9)
MONO%: 3 % (ref 0.0–14.0)
NEUT#: 12.1 10*3/uL — ABNORMAL HIGH (ref 1.5–6.5)
NEUT%: 91.6 % — ABNORMAL HIGH (ref 39.0–75.0)
RBC: 4.05 10*6/uL — ABNORMAL LOW (ref 4.20–5.82)
RDW: 27.3 % — ABNORMAL HIGH (ref 11.0–14.6)
WBC: 13.2 10*3/uL — ABNORMAL HIGH (ref 4.0–10.3)
lymph#: 0.6 10*3/uL — ABNORMAL LOW (ref 0.9–3.3)

## 2013-11-13 LAB — TECHNOLOGIST REVIEW

## 2013-11-13 MED ORDER — CAPECITABINE 500 MG PO TABS: 1500.0000 mg | ORAL_TABLET | Freq: Two times a day (BID) | ORAL | Status: DC

## 2013-11-13 NOTE — Progress Notes (Signed)
After meeting with patient in chemo class he and sig. Other, [name] are going to try to get the xeloda on their own with some financial help from friends.   Patient does not want to dot the CI pump.   Spoke with Dr. Benay Spice and Charlena Cross who spoke with [facility] outpt. Pharmacy.  Kenney Houseman will submit the xeloda script to [facility] via escribe.

## 2013-11-14 ENCOUNTER — Telehealth: Payer: Self-pay | Admitting: *Deleted

## 2013-11-14 NOTE — Telephone Encounter (Signed)
Confirmed pharmacy has received script for xeloda from our office and will order medication today. Should arrive by early next week. They will call patient when it is here. Should be covered by his [facility] account.

## 2013-11-18 ENCOUNTER — Ambulatory Visit: Payer: Medicaid Other

## 2013-11-18 ENCOUNTER — Ambulatory Visit
Admission: RE | Admit: 2013-11-18 | Discharge: 2013-11-18 | Disposition: A | Discharge status: Home / Self Care | Payer: Medicaid Other | Source: Ambulatory Visit | Attending: Radiation Oncology | Admitting: Radiation Oncology

## 2013-11-18 DIAGNOSIS — C218 Malignant neoplasm of overlapping sites of rectum, anus and anal canal: Secondary | ICD-10-CM | POA: Insufficient documentation

## 2013-11-18 DIAGNOSIS — Z933 Colostomy status: Secondary | ICD-10-CM | POA: Insufficient documentation

## 2013-11-18 DIAGNOSIS — F1210 Cannabis abuse, uncomplicated: Secondary | ICD-10-CM | POA: Insufficient documentation

## 2013-11-18 DIAGNOSIS — R918 Other nonspecific abnormal finding of lung field: Secondary | ICD-10-CM | POA: Insufficient documentation

## 2013-11-18 DIAGNOSIS — Z87891 Personal history of nicotine dependence: Secondary | ICD-10-CM | POA: Insufficient documentation

## 2013-11-18 DIAGNOSIS — N1330 Unspecified hydronephrosis: Secondary | ICD-10-CM | POA: Insufficient documentation

## 2013-11-18 DIAGNOSIS — Z510 Encounter for antineoplastic radiation therapy: Secondary | ICD-10-CM | POA: Insufficient documentation

## 2013-12-03 ENCOUNTER — Other Ambulatory Visit (HOSPITAL_BASED_OUTPATIENT_CLINIC_OR_DEPARTMENT_OTHER): Payer: Self-pay

## 2013-12-03 ENCOUNTER — Ambulatory Visit (HOSPITAL_BASED_OUTPATIENT_CLINIC_OR_DEPARTMENT_OTHER): Payer: Self-pay | Admitting: Nurse Practitioner

## 2013-12-03 ENCOUNTER — Other Ambulatory Visit: Payer: Self-pay | Admitting: *Deleted

## 2013-12-03 VITALS — BP 136/75 | HR 124 | Temp 98.8°F | Resp 19 | Ht 76.0 in | Wt 139.8 lb

## 2013-12-03 DIAGNOSIS — C20 Malignant neoplasm of rectum: Secondary | ICD-10-CM

## 2013-12-03 DIAGNOSIS — R109 Unspecified abdominal pain: Secondary | ICD-10-CM

## 2013-12-03 DIAGNOSIS — Z800 Family history of malignant neoplasm of digestive organs: Secondary | ICD-10-CM

## 2013-12-03 DIAGNOSIS — N1330 Unspecified hydronephrosis: Secondary | ICD-10-CM

## 2013-12-03 DIAGNOSIS — R630 Anorexia: Secondary | ICD-10-CM

## 2013-12-03 DIAGNOSIS — R634 Abnormal weight loss: Secondary | ICD-10-CM

## 2013-12-03 DIAGNOSIS — R911 Solitary pulmonary nodule: Secondary | ICD-10-CM

## 2013-12-03 DIAGNOSIS — D509 Iron deficiency anemia, unspecified: Secondary | ICD-10-CM

## 2013-12-03 LAB — CBC WITH DIFFERENTIAL/PLATELET
BASO%: 0.2 % (ref 0.0–2.0)
Basophils Absolute: 0 10*3/uL (ref 0.0–0.1)
EOS ABS: 0 10*3/uL (ref 0.0–0.5)
EOS%: 0.3 % (ref 0.0–7.0)
HCT: 29.6 % — ABNORMAL LOW (ref 38.4–49.9)
HGB: 9.6 g/dL — ABNORMAL LOW (ref 13.0–17.1)
LYMPH#: 1 10*3/uL (ref 0.9–3.3)
LYMPH%: 6.6 % — ABNORMAL LOW (ref 14.0–49.0)
MCH: 23.3 pg — ABNORMAL LOW (ref 27.2–33.4)
MCHC: 32.4 g/dL (ref 32.0–36.0)
MCV: 72 fL — ABNORMAL LOW (ref 79.3–98.0)
MONO#: 0.6 10*3/uL (ref 0.1–0.9)
MONO%: 4.2 % (ref 0.0–14.0)
NEUT#: 12.9 10*3/uL — ABNORMAL HIGH (ref 1.5–6.5)
NEUT%: 88.7 % — ABNORMAL HIGH (ref 39.0–75.0)
PLATELETS: 524 10*3/uL — AB (ref 140–400)
RBC: 4.11 10*6/uL — AB (ref 4.20–5.82)
RDW: 22.1 % — ABNORMAL HIGH (ref 11.0–14.6)
WBC: 14.5 10*3/uL — ABNORMAL HIGH (ref 4.0–10.3)

## 2013-12-03 MED ORDER — OXYCODONE-ACETAMINOPHEN 5-325 MG PO TABS: 2.0000 | ORAL_TABLET | Freq: Four times a day (QID) | ORAL | Status: DC | PRN

## 2013-12-03 MED ORDER — ALPRAZOLAM 0.5 MG PO TABS: 0.5000 mg | ORAL_TABLET | Freq: Two times a day (BID) | ORAL | Status: DC | PRN

## 2013-12-03 NOTE — Telephone Encounter (Signed)
Faxed Xanax prescription to [facility].

## 2013-12-03 NOTE — Progress Notes (Addendum)
  Grant [name]   Diagnosis:  Rectal cancer.  INTERVAL HISTORY:   Grant [name] returns as scheduled. He has not started radiation and Xeloda. He is scheduled to see Dr. Lisbeth Renshaw 12/04/2013.  He continues to lose weight. Appetite is poor. He notes that his ostomy has "shrunk" over the past 2 days. He reports continued output from the ostomy. He estimates emptying the collection bag 3 or 4 times a day. He intermittently has a discharge from the rectum. He describes the discharge as clear, occasionally bloody. He continues to have significant abdominal pain. He is taking 2 Percocet tablets about 4 times a day and reports fairly good pain control.  Objective:  Vital signs in last 24 hours:  Blood pressure 136/75, pulse 124, temperature 98.8 F (37.1 C), temperature source Oral, resp. rate 19, height 6\' 4"  (1.93 m), weight 139 lb 12.8 oz (63.413 kg), SpO2 99.00%. repeat heart rate 100.    HEENT: No thrush or ulcerations. Resp: Lungs clear. Cardio: Regular cardiac rhythm. GI: Tender over the low abdomen. No hepatomegaly. Left lower quadrant colostomy. The stoma appears retracted. Vascular: No leg edema.  Skin: Skin surrounding the ostomy site is erythematous.    Lab Results:  Lab Results  Component Value Date   WBC 14.5* 12/03/2013   HGB 9.6* 12/03/2013   HCT 29.6* 12/03/2013   MCV 72.0* 12/03/2013   PLT 524* 12/03/2013   NEUTROABS 12.9* 12/03/2013    Imaging:  No results found.  Medications: I have reviewed the patient's current medications.  Assessment/Plan: 1. Rectal cancer-large pelvic mass with a colonoscopy 10/11/2013 confirming an obstructing tumor at 9 cm from the anal verge CTs of the chest, abdomen, and revealed a large complex pelvic mass, gas in the bladder, loculated gas posterior to the bladder, right hydronephrosis, and small bilateral indeterminant lung nodules 2. Right hydronephrosis secondary to the obstructing pelvic mass, status  post placement of a right percutaneous nephrostomy tube followed by a right ureter stent. 3. Microcytic anemia-likely iron deficiency anemia secondary to bleeding from the rectal tumor-now taking iron.  4. Possible colovesical fistula-on the CT 10/09/2013 evaluated by Dr. Alinda Money, followup CT cystogram 10/18/2013 revealed no fistula.  5. Anorexia/weight loss secondary to #1. 6. Indeterminate lung nodules on a CT of the chest 10/09/2013.  7. Family history of pancreas and colon cancer. 8. Retracted stoma.    Disposition: Grant [name] has the Xeloda pills. He is scheduled to see Dr. Lisbeth Renshaw for an initial visit 12/04/2013. He understands to begin Xeloda coinciding with initiation of radiation.  He was given a new prescription for Percocet 5/325 2 tablets every 6 hours as needed.  We will schedule a return visit approximately 2 weeks into the course of radiation/Xeloda.  We will refer him to the ostomy nurse for assistance with management of the ostomy.   Patient seen with Dr. Benay Spice.   Ned Card ANP/GNP-BC   12/03/2013  12:32 PM  This was a shared visit with Ned Card. Grant [name] was interviewed and examined.  The plan is to begin concurrent Xeloda and radiation as soon as radiation planning can be completed. We will refer him to an ostomy nurse for evaluation of the ostomy site.  Julieanne Manson, M.D.

## 2013-12-03 NOTE — Progress Notes (Signed)
   GU Location of Tumor / Histology: Rectosigmoid colon mass, 9 cm from the anal verge  Grant [name] presented months ago with signs/symptoms of: weight loss , fecal incontinence, loose intermittent stools, thin anemic   Biopsies of (if applicable) revealed: Diagnosis 10/11/13:   Rectum, biopsy- ADENOCARCINOMA.   Past/Anticipated interventions by urology, if any: Dr.Borden place a Right Percutaneous nephrostomy tube and stent 10/18/13, and the nephrostomy tube was removed   Past/Anticipated interventions by medical oncology, if any: Dr.Sherrill seen On 12/03/13 Grant [name] has the Xeloda pills. He is scheduled to see Dr. Lisbeth Renshaw for an initial visit 12/04/2013. He understands to begin Xeloda coinciding with initiation of radiation.  We will schedule a return visit approximately 2 weeks into the course of radiation/Xeloda.   Weight changes, if any: 25 lb wt.loss past month, had consult with Ernestene Kiel, nutritionist on 11/12/13  Bowel/Bladder complaints, if any: loose stools, flatus, has colostomy, still passes small amts clear liquid, blood, feces from rectum   Nausea/Vomiting, if any: no  Pain issues, if any: 8/10 on pain scale in rectum and abdomen. Last dose of Percocet 8:30 am this morning; pt taking 2 tabs every 6 hours.   SAFETY ISSUES: YES Prior radiation? NO  Pacemaker/ICD? NO  Is the patient on methotrexate? 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 Dr. Ninfa Linden performed a laparoscopic assisted descending loop colostomy 10/15/13, d/c hospital Nov 08, 2013  Father died pancreatic cancer age 58, 1/2 maternal aunt died of colon cancer in her 64's.  Patient scheduled to see ostomy RN tomorrow, 11 am

## 2013-12-04 ENCOUNTER — Ambulatory Visit
Admission: RE | Admit: 2013-12-04 | Discharge: 2013-12-04 | Disposition: A | Discharge status: Home / Self Care | Payer: Medicaid Other | Source: Ambulatory Visit | Attending: Radiation Oncology | Admitting: Radiation Oncology

## 2013-12-04 ENCOUNTER — Encounter: Payer: Self-pay | Admitting: Radiation Oncology

## 2013-12-04 VITALS — BP 117/73 | HR 115 | Temp 98.3°F | Resp 20 | Ht 76.0 in | Wt 139.7 lb

## 2013-12-04 DIAGNOSIS — C20 Malignant neoplasm of rectum: Secondary | ICD-10-CM

## 2013-12-04 DIAGNOSIS — Z510 Encounter for antineoplastic radiation therapy: Secondary | ICD-10-CM | POA: Diagnosis present

## 2013-12-04 DIAGNOSIS — F1210 Cannabis abuse, uncomplicated: Secondary | ICD-10-CM | POA: Diagnosis not present

## 2013-12-04 DIAGNOSIS — N1330 Unspecified hydronephrosis: Secondary | ICD-10-CM | POA: Diagnosis not present

## 2013-12-04 DIAGNOSIS — C218 Malignant neoplasm of overlapping sites of rectum, anus and anal canal: Secondary | ICD-10-CM | POA: Diagnosis not present

## 2013-12-04 DIAGNOSIS — Z87891 Personal history of nicotine dependence: Secondary | ICD-10-CM | POA: Diagnosis not present

## 2013-12-04 DIAGNOSIS — Z933 Colostomy status: Secondary | ICD-10-CM | POA: Diagnosis not present

## 2013-12-04 DIAGNOSIS — R918 Other nonspecific abnormal finding of lung field: Secondary | ICD-10-CM | POA: Diagnosis not present

## 2013-12-04 MED ORDER — OXYCODONE-ACETAMINOPHEN 5-325 MG PO TABS
2.0000 | ORAL_TABLET | Freq: Once | ORAL | Status: DC
  Filled 2013-12-04: qty 2

## 2013-12-04 NOTE — Addendum Note (Signed)
Encounter addended by: Andria Rhein, RN on: 12/04/2013  3:46 PM<BR>     Documentation filed: Charges VN, Notes Section

## 2013-12-04 NOTE — Progress Notes (Signed)
Spoke w/[place], SW who has seen pt in past. She will FU w/pt through phone call. Will notify pt.

## 2013-12-04 NOTE — Addendum Note (Signed)
Encounter addended by: Andria Rhein, RN on: 12/04/2013  4:37 PM<BR>     Documentation filed: Notes Section

## 2013-12-04 NOTE — Progress Notes (Signed)
Patient states his "pain is easing off, and by the time I get to 29 it'll be good". Patient was walking more upright as he left clinic today.

## 2013-12-04 NOTE — Progress Notes (Signed)
Please see the Nurse Progress Note in the MD Initial Consult Encounter for this patient. 

## 2013-12-04 NOTE — Addendum Note (Signed)
Encounter addended by: Andria Rhein, RN on: 12/04/2013  3:49 PM<BR>     Documentation filed: Orders

## 2013-12-05 ENCOUNTER — Encounter: Payer: Self-pay | Admitting: *Deleted

## 2013-12-05 ENCOUNTER — Encounter (INDEPENDENT_AMBULATORY_CARE_PROVIDER_SITE_OTHER): Payer: Self-pay | Admitting: Surgery

## 2013-12-05 ENCOUNTER — Ambulatory Visit (INDEPENDENT_AMBULATORY_CARE_PROVIDER_SITE_OTHER): Payer: Self-pay | Admitting: Surgery

## 2013-12-05 VITALS — BP 120/70 | HR 110 | Temp 97.4°F | Ht 76.0 in | Wt 140.0 lb

## 2013-12-05 DIAGNOSIS — C189 Malignant neoplasm of colon, unspecified: Secondary | ICD-10-CM

## 2013-12-05 DIAGNOSIS — C20 Malignant neoplasm of rectum: Secondary | ICD-10-CM

## 2013-12-05 NOTE — Progress Notes (Signed)
General Surgery [facility]  Chief Complaint  Patient presents with  . Post-op Problem    descending colostomy - patient of Dr. Nedra Hai    HISTORY: Patient is a 58 year old male who underwent colostomy for locally advanced colorectal cancer. He is having problems with the function of his colostomy and believes it has retracted. He has not had any significant bowel movements for 4 days.  EXAM: Stoma is retracted. There is a hard rounded inspissated stool present. With gentle manipulation digitally and with judicious use of instrumentation, I am able to deliver some of the hard stool from within the distal bowel. The stoma appears healthy and viable although mildly retracted. With digital exam there are further impacted stool present approximately 8-10 cm from the stoma.  IMPRESSION: Fecal impaction and descending colostomy  PLAN: Colostomy bag is changed in the office. There is an element of retraction but I do not believe this is the principal problem. Patient is impacted.  Instructions are given for administering a fleets enema through the stoma. I would also like the patient to take milk of magnesia, 4 tablespoons, and a large glass of water this evening. I also advised him to start taking MiraLAX twice daily.  Patient will return to see Dr. Ninfa Linden in one to 2 weeks to assess his stoma.  Earnstine Regal, MD, [facility]   Visit Diagnoses: 1. Adenocarcinoma of colon   2. Rectal cancer

## 2013-12-05 NOTE — Progress Notes (Signed)
WOC ostomy consult note  Asked to see patient in the cancer center today per the GI navigator RN Marcellus Scott.  Pt with LLQ, end colostomy. He has been doing fine with the care of this stoma for a month or so. Two days ago he noted the stoma more retracted and below the skin level. He noted at the same time to have a change in his output and only gas and some liquid.  His output had been semiformed prior to this. Evaluated pt sitting and lying down. When he sits up the stoma is completely obstructed by the skin and retracts even further into the abdomen.  Stoma type/location: RLQ, very low and near the pelvic bone.  Stomal assessment/size: stoma is completely retracted below skin level.  I can see the edge of the mucosa at 6-7 o'clock  but I can not she the remainder of the stoma or the os of the stoma.  Peristomal assessment: denudation circumferentially, the opening of the wafer was cut rather large for the retracted stoma at this point.   Treatment options for stomal/peristomal skin: I treated the peristomal skin with ostomy powder today and instructed the patient and family member on the "crusting" technique Output scant liquid, brown output Ostomy pouching: 1pc convex pouch placed today, concerned due to location that this may be too rigid. And due to the complete retraction of the stoma and the skin folds that this may not stay in place for the patient. Therefore I gave him and "Plan B" with flat one pc (which he is accustomed to using) but to add a barrier ring for more flexible convexity.  I am concerned with his pending chemo and radiation that the associated diarrhea is going to be challenging for this patient with the condition of his stoma.  With the recent changes in the bowel pattern in conjunction with the retractation of the stoma it was decided to have him evaluated by the surgery team. Updated Dr. Benay Spice the oncologist on the status of the patient and provided samples of pouching options to  try with this very difficult stoma situation. Convex 1pc and flat 1pc sent home with patient along with barrier rings and ostomy powder to continue to treat his irritated skin.    Please contact [facility] [place] team if further assistance is needed.  Tushar Enns [place] RN,CWOCN [phone]

## 2013-12-05 NOTE — Patient Instructions (Signed)
#  1 given a fleets enema through the stoma for relief of impaction  #2 administer orally milk of magnesia 4 tablespoons followed by a large glass of water  #3 begin MiraLAX twice daily  Earnstine Regal, MD, [facility] Office: [phone]

## 2013-12-05 NOTE — Progress Notes (Signed)
Patient came to [facility] today to meet with [place] RN per request of Dr. Benay Spice.  He was seen by [facility] RN and case discussed qith Dr. Benay Spice.  HE has appointment with Dr. Harlow Asa today at 3:30.

## 2013-12-06 ENCOUNTER — Ambulatory Visit
Admission: RE | Admit: 2013-12-06 | Discharge: 2013-12-06 | Disposition: A | Discharge status: Home / Self Care | Payer: Medicaid Other | Source: Ambulatory Visit | Attending: Radiation Oncology | Admitting: Radiation Oncology

## 2013-12-06 ENCOUNTER — Encounter: Payer: Self-pay | Admitting: *Deleted

## 2013-12-06 DIAGNOSIS — C20 Malignant neoplasm of rectum: Secondary | ICD-10-CM

## 2013-12-06 DIAGNOSIS — Z510 Encounter for antineoplastic radiation therapy: Secondary | ICD-10-CM | POA: Diagnosis not present

## 2013-12-06 NOTE — Progress Notes (Signed)
Radiation Oncology         [phone] ________________________________  Name: Grant [name] MRN: [id number]  Date: 12/04/2013  DOB: 1955/10/11  CC:No PCP Per Patient  [name], MD     REFERRING PHYSICIAN: [name], MD   DIAGNOSIS: The primary encounter diagnosis was Malignant neoplasm of rectum. A diagnosis of Rectal cancer was also pertinent to this visit.   HISTORY OF PRESENT ILLNESS::Grant [name] is a 58 y.o. male who is seen for an initial consultation visit. The patient initially presented to the emergency room in May of 2015. He was experiencing ongoing diarrhea and also had suffered from an approximate loss of 20 pounds recently. Workup demonstrated a low hematocrit. Imaging therefore was performed and a CT scan of the abdomen and pelvis revealed a large tumor involving the rectosigmoid colon. Encasement of the right ureter was noted with associated right-sided hydronephrosis. 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 Some small bilateral pulmonary nodules were also seen.  The patient was taken for a colonoscopy on 10/11/2013. A malignant appearing mass was seen at 9 cm from the anal verge. The lumen was markedly narrowed. A biopsy was obtained and this confirmed adenocarcinoma. A right cutaneous nephrostomy tube has been placed and a right ureteral stent was placed on 10/18/2013 and the nephrostomy tube was removed. A CT cystogram did not reveal any definite fistula.  The patient was seen by surgery and he was taken for a laparoscopic descending loop colostomy on 10/15/2013. The patient states that the colostomy has been working fairly well. He states that he has been passing some air and his urine and this is also discolored as well. He has been seen by Dr. Benay Spice in medical oncology and his case has been discussed at multidisciplinary GI conference. I have been asked to see the patient for consideration of radiation treatment  at this time.   PREVIOUS RADIATION THERAPY: No   PAST MEDICAL HISTORY:  has a past medical history of Fecal incontinence.     PAST SURGICAL HISTORY: Past Surgical History  Procedure Laterality Date  . Mandible surgery  1983  . Colonoscopy N/A 10/11/2013    Procedure: COLONOSCOPY;  Surgeon: [name], MD;  Location: [facility];  Service: Endoscopy;  Laterality: N/A;  . Colon resection N/A 10/15/2013    Procedure: LAP ASSISTED  LOOP COLOSTOMY;  Surgeon: Harl Bowie, MD;  Location: [facility];  Service: General;  Laterality: N/A;     FAMILY HISTORY: family history is not on file.   SOCIAL HISTORY:  reports that he quit smoking about 25 years ago. His smokeless tobacco use includes Snuff. He reports that he drinks alcohol. He reports that he uses illicit drugs (Marijuana).   ALLERGIES: Strawberry; Sunflower oil; and Watermelon   MEDICATIONS:  Current Outpatient Prescriptions  Medication Sig Dispense Refill  . acetaminophen (TYLENOL) 325 MG tablet Take 650 mg by mouth every 6 (six) hours as needed.      . ALPRAZolam (XANAX) 0.5 MG tablet Take 1 tablet (0.5 mg total) by mouth 2 (two) times daily as needed for anxiety.  60 tablet  1  . ferrous sulfate 325 (65 FE) MG tablet Take 1 tablet (325 mg total) by mouth 2 (two) times daily with a meal.  60 tablet  0  . lactose free nutrition (BOOST PLUS) LIQD Take 237 mLs by mouth 3 (three) times daily with meals.  60 Can  0  . Multiple Vitamin (MULTIVITAMIN WITH  MINERALS) TABS tablet Take 1 tablet by mouth daily.  30 tablet  0  . oxyCODONE-acetaminophen (PERCOCET/ROXICET) 5-325 MG per tablet Take 2 tablets by mouth every 6 (six) hours as needed for severe pain.  75 tablet  0  . capecitabine (XELODA) 500 MG tablet Take 3 tablets (1,500 mg total) by mouth 2 (two) times daily after a meal. On days of radiation only (Mon-Fri)  168 tablet  0   No current facility-administered medications for this encounter.     REVIEW OF SYSTEMS:  A 15  point review of systems is documented in the electronic medical record. This was obtained by the nursing staff. However, I reviewed this with the patient to discuss relevant findings and make appropriate changes.  Pertinent items are noted in HPI.    PHYSICAL EXAM:  height is 6\' 4"  (1.93 m) and weight is 139 lb 11.2 oz (63.368 kg). His oral temperature is 98.3 F (36.8 C). His blood pressure is 117/73 and his pulse is 115. His respiration is 20.   ECOG = 1  0 - Asymptomatic (Fully active, able to carry on all predisease activities without restriction)  1 - Symptomatic but completely ambulatory (Restricted in physically strenuous activity but ambulatory and able to carry out work of a light or sedentary nature. For example, light housework, office work)  2 - Symptomatic, <50% in bed during the day (Ambulatory and capable of all self care but unable to carry out any work activities. Up and about more than 50% of waking hours)  3 - Symptomatic, >50% in bed, but not bedbound (Capable of only limited self-care, confined to bed or chair 50% or more of waking hours)  4 - Bedbound (Completely disabled. Cannot carry on any self-care. Totally confined to bed or chair)  5 - Death   Grant [name] MM, Grant [name], Torm[place] DC, et al. [phone]). "Toxicity and response criteria of the [facility]". [place] Oncol. 5 (6): 649-55  General:  thin, in no acute distress HEENT: Normocephalic, atraumatic; oral cavity clear Neck: Supple thin any lymphadenopathy Cardiovascular: Regular rate and rhythm Respiratory: Clear to auscultation bilaterally GI: Soft, nontender, normal bowel sounds Extremities: No edema present Neuro: No focal deficits Rectal: No external lesions. The patient had significant discomfort on rectal exam. I do not palpate any discrete mass but the patient requested that the exam be discontinued due to significant discomfort. No blood on exam glove.     LABORATORY DATA:  Lab  Results  Component Value Date   WBC 14.5* 12/03/2013   HGB 9.6* 12/03/2013   HCT 29.6* 12/03/2013   MCV 72.0* 12/03/2013   PLT 524* 12/03/2013   Lab Results  Component Value Date   NA 134* 11/13/2013   K 4.7 11/13/2013   CL 104 10/17/2013   CO2 27 11/13/2013   Lab Results  Component Value Date   ALT 9 11/13/2013   AST 9 11/13/2013   ALKPHOS 77 11/13/2013   BILITOT 0.37 11/13/2013      RADIOGRAPHY: No results found.     IMPRESSION: The patient has a very bulky rectosigmoid tumor extending to 9 cm from the anal verge. This represents a locally advanced stage III tumor with possible posterior involvement of the bladder.  I believe that the patient is an appropriate candidate for neoadjuvant chemoradiation treatment. This was discussed at GI conference and this was the recommendation. I discussed the rationale of such a treatment with the patient. We discussed the possible benefit as  well as possible side effects and risks as well. I did discuss with him the possible fold issue of a fistula. We discussed that radiation treatment can worsen or cause development of a fistula in such a locally danced setting. However I believe that the benefits of preoperative radiation treatment outweigh the risks. Certainly chemoradiation treatment will hopefully help a margin negative surgical resection be achieved.  All the patient's questions were answered. He is having some discomfort and he is eager to begin treatment as sitting as possible.   PLAN: The patient will undergo a simulation later this week as soon as this can be arranged. We will begin his treatment as soon as possible thereafter and I anticipate that he will begin his radiation treatment on 12/11/2013 with concurrent Xeloda.        ________________________________   Jodelle Gross, MD, PhD   **Disclaimer: This note was dictated with voice recognition software. Similar sounding words can inadvertently be transcribed and this note may contain  transcription errors which may not have been corrected upon publication of note.**

## 2013-12-06 NOTE — Addendum Note (Signed)
Encounter addended by: Marye Round, MD on: 12/06/2013  3:32 PM<BR>     Documentation filed: Notes Section, Problem List, Visit Diagnoses, Follow-up Section, LOS Section

## 2013-12-06 NOTE — Progress Notes (Signed)
[place] Psychosocial Distress Screening Clinical Social Work  Clinical Social Work was referred by distress screening protocol.  The patient scored a 8 on the Psychosocial Distress Thermometer which indicates severe distress. Clinical Social Worker tried to see pt at [facility] and also called him to assess for distress and other psychosocial needs. CSW missed pt at [facility] and left message to call CSW re. the below concerns. Pt has not returned call to date. CSW will try again to see pt and assist him with his needs.   ONCBCN DISTRESS SCREENING 12/04/2013  Screening Type Initial Screening  Elta [place] the number that describes how much distress you have been experiencing in the past week 8  Practical problem type Transportation  Emotional problem type Depression;Nervousness/Anxiety;Adjusting to illness  Physical Problem type Pain;Sleep/insomnia;Getting around;Loss of appetitie;Changes in urination;Skin dry/itchy  Other "having cancer" listed as most distressing    Clinical Social Worker follow up needed: Yes.    If yes, follow up plan: Continue to call, try to see at next appt.   Loren Racer, LCSW Clinical Social Worker [facility] for [place] Wednesday, Thursday and Friday Phone: [phone] Fax: [phone]

## 2013-12-08 NOTE — Progress Notes (Signed)
  Radiation Oncology         [phone] ________________________________  Name: Grant [name] MRN: [id number]  Date: 12/06/2013  DOB: 30-Jul-1955  SIMULATION AND TREATMENT PLANNING NOTE  The patient presented for simulation for the patient's upcoming course of preoperative radiation for the diagnosis of rectal cancer. The patient was placed in a supine position. A customized alpha cradle was constructed toaid in patient immobilization. This complex treatment device will be used on a daily basis during the treatment. In this fashion a CT scan was obtained through the pelvic region and the isocenter was placed near midline within the pelvis.  The patient will initially be planned to receive a course of radiation to a dose of 45 gray. This will be accomplished in 25 fractions at 1.8 gray per fraction. This initial treatment will correspond to a 3-D conformal technique. The gross tumor volume has been contoured in addition to the rectum, bladder and femoral heads. DVH's of each of these structures have been requested and these will be carefully reviewed as part of the 3-D conformal treatment planning process. To accomplish this initial treatment, 4 customized blocks have been designed for this purpose. Each of these 4 complex treatment devices will be used on a daily basis during the initial course of his treatment. It is anticipated that the patient will then receive a boost for an additional 5.4 gray. The anticipated total dose therefore will be 50.4 gray.  Special treatment procedure The patient will receive chemotherapy during the course of radiation treatment. The patient may experience increased or overlapping toxicity due to this combined-modality approach and the patient will be monitored for such problems. This may include extra lab work as necessary. This therefore constitutes a special treatment procedure.    ________________________________  Jodelle Gross, MD, PhD

## 2013-12-10 ENCOUNTER — Encounter (INDEPENDENT_AMBULATORY_CARE_PROVIDER_SITE_OTHER): Payer: Self-pay | Admitting: Surgery

## 2013-12-10 ENCOUNTER — Encounter (HOSPITAL_COMMUNITY): Payer: Self-pay | Admitting: Emergency Medicine

## 2013-12-10 ENCOUNTER — Inpatient Hospital Stay (HOSPITAL_COMMUNITY)
Admission: EM | Admit: 2013-12-10 | Discharge: 2013-12-13 | DRG: 693 | Disposition: A | Discharge status: Other Acute Inpatient Hospital | Payer: Medicaid Other | Attending: General Surgery | Admitting: General Surgery

## 2013-12-10 ENCOUNTER — Emergency Department (HOSPITAL_COMMUNITY): Payer: Medicaid Other

## 2013-12-10 DIAGNOSIS — D500 Iron deficiency anemia secondary to blood loss (chronic): Secondary | ICD-10-CM | POA: Diagnosis present

## 2013-12-10 DIAGNOSIS — N321 Vesicointestinal fistula: Secondary | ICD-10-CM | POA: Diagnosis present

## 2013-12-10 DIAGNOSIS — D72829 Elevated white blood cell count, unspecified: Secondary | ICD-10-CM | POA: Diagnosis present

## 2013-12-10 DIAGNOSIS — Z79899 Other long term (current) drug therapy: Secondary | ICD-10-CM | POA: Diagnosis not present

## 2013-12-10 DIAGNOSIS — R634 Abnormal weight loss: Secondary | ICD-10-CM | POA: Diagnosis present

## 2013-12-10 DIAGNOSIS — D638 Anemia in other chronic diseases classified elsewhere: Secondary | ICD-10-CM

## 2013-12-10 DIAGNOSIS — E860 Dehydration: Secondary | ICD-10-CM

## 2013-12-10 DIAGNOSIS — E43 Unspecified severe protein-calorie malnutrition: Secondary | ICD-10-CM | POA: Diagnosis present

## 2013-12-10 DIAGNOSIS — R64 Cachexia: Secondary | ICD-10-CM | POA: Diagnosis present

## 2013-12-10 DIAGNOSIS — R918 Other nonspecific abnormal finding of lung field: Secondary | ICD-10-CM | POA: Diagnosis present

## 2013-12-10 DIAGNOSIS — Z87440 Personal history of urinary (tract) infections: Secondary | ICD-10-CM | POA: Diagnosis not present

## 2013-12-10 DIAGNOSIS — Z91018 Allergy to other foods: Secondary | ICD-10-CM

## 2013-12-10 DIAGNOSIS — C20 Malignant neoplasm of rectum: Secondary | ICD-10-CM

## 2013-12-10 DIAGNOSIS — K5900 Constipation, unspecified: Secondary | ICD-10-CM | POA: Diagnosis present

## 2013-12-10 DIAGNOSIS — N1330 Unspecified hydronephrosis: Secondary | ICD-10-CM | POA: Diagnosis present

## 2013-12-10 DIAGNOSIS — Z87891 Personal history of nicotine dependence: Secondary | ICD-10-CM | POA: Diagnosis not present

## 2013-12-10 DIAGNOSIS — Z510 Encounter for antineoplastic radiation therapy: Secondary | ICD-10-CM | POA: Diagnosis not present

## 2013-12-10 DIAGNOSIS — D509 Iron deficiency anemia, unspecified: Secondary | ICD-10-CM | POA: Diagnosis present

## 2013-12-10 DIAGNOSIS — Z800 Family history of malignant neoplasm of digestive organs: Secondary | ICD-10-CM

## 2013-12-10 DIAGNOSIS — Z681 Body mass index (BMI) 19 or less, adult: Secondary | ICD-10-CM | POA: Diagnosis not present

## 2013-12-10 DIAGNOSIS — R197 Diarrhea, unspecified: Secondary | ICD-10-CM | POA: Diagnosis present

## 2013-12-10 DIAGNOSIS — C189 Malignant neoplasm of colon, unspecified: Secondary | ICD-10-CM

## 2013-12-10 DIAGNOSIS — N390 Urinary tract infection, site not specified: Secondary | ICD-10-CM | POA: Diagnosis present

## 2013-12-10 DIAGNOSIS — N135 Crossing vessel and stricture of ureter without hydronephrosis: Secondary | ICD-10-CM | POA: Diagnosis present

## 2013-12-10 DIAGNOSIS — Z933 Colostomy status: Secondary | ICD-10-CM | POA: Diagnosis not present

## 2013-12-10 DIAGNOSIS — E871 Hypo-osmolality and hyponatremia: Secondary | ICD-10-CM | POA: Diagnosis present

## 2013-12-10 DIAGNOSIS — R630 Anorexia: Secondary | ICD-10-CM | POA: Diagnosis present

## 2013-12-10 LAB — COMPREHENSIVE METABOLIC PANEL
ALBUMIN: 2.5 g/dL — AB (ref 3.5–5.2)
ALBUMIN: 2.2 g/dL — AB (ref 3.5–5.2)
ALT: 7 U/L (ref 0–53)
ALT: 5 U/L (ref 0–53)
ANION GAP: 14 (ref 5–15)
AST: 14 U/L (ref 0–37)
AST: 15 U/L (ref 0–37)
Alkaline Phosphatase: 109 U/L (ref 39–117)
Alkaline Phosphatase: 94 U/L (ref 39–117)
Anion gap: 16 — ABNORMAL HIGH (ref 5–15)
BILIRUBIN TOTAL: 0.4 mg/dL (ref 0.3–1.2)
BILIRUBIN TOTAL: 0.4 mg/dL (ref 0.3–1.2)
BUN: 18 mg/dL (ref 6–23)
BUN: 16 mg/dL (ref 6–23)
CHLORIDE: 89 meq/L — AB (ref 96–112)
CO2: 23 meq/L (ref 19–32)
CO2: 24 mEq/L (ref 19–32)
CREATININE: 1.01 mg/dL (ref 0.50–1.35)
Calcium: 9.5 mg/dL (ref 8.4–10.5)
Calcium: 8.7 mg/dL (ref 8.4–10.5)
Chloride: 90 mEq/L — ABNORMAL LOW (ref 96–112)
Creatinine, Ser: 0.95 mg/dL (ref 0.50–1.35)
GFR calc Af Amer: >90 mL/min (ref >90)
GFR calc Af Amer: >90 mL/min (ref >90)
GFR calc non Af Amer: 81 mL/min — ABNORMAL LOW (ref >90)
GFR calc non Af Amer: >90 mL/min (ref >90)
Glucose, Bld: 166 mg/dL — ABNORMAL HIGH (ref 70–99)
Glucose, Bld: 147 mg/dL — ABNORMAL HIGH (ref 70–99)
POTASSIUM: 4 meq/L (ref 3.7–5.3)
Potassium: 4.2 mEq/L (ref 3.7–5.3)
Sodium: 128 mEq/L — ABNORMAL LOW (ref 137–147)
Sodium: 128 mEq/L — ABNORMAL LOW (ref 137–147)
TOTAL PROTEIN: 6.8 g/dL (ref 6.0–8.3)
Total Protein: 7.7 g/dL (ref 6.0–8.3)

## 2013-12-10 LAB — CBC WITH DIFFERENTIAL/PLATELET
BASOS ABS: 0 10*3/uL (ref 0.0–0.1)
Basophils Relative: 0 % (ref 0–1)
Eosinophils Absolute: 0 10*3/uL (ref 0.0–0.7)
Eosinophils Relative: 0 % (ref 0–5)
HEMATOCRIT: 31 % — AB (ref 39.0–52.0)
HEMOGLOBIN: 9.7 g/dL — AB (ref 13.0–17.0)
LYMPHS PCT: 7 % — AB (ref 12–46)
Lymphs Abs: 0.9 10*3/uL (ref 0.7–4.0)
MCH: 22.9 pg — ABNORMAL LOW (ref 26.0–34.0)
MCHC: 31.3 g/dL (ref 30.0–36.0)
MCV: 73.3 fL — ABNORMAL LOW (ref 78.0–100.0)
MONO ABS: 0.6 10*3/uL (ref 0.1–1.0)
Monocytes Relative: 5 % (ref 3–12)
NEUTROS PCT: 88 % — AB (ref 43–77)
Neutro Abs: 11.1 10*3/uL — ABNORMAL HIGH (ref 1.7–7.7)
Platelets: 399 10*3/uL (ref 150–400)
RBC: 4.23 MIL/uL (ref 4.22–5.81)
RDW: 18.3 % — ABNORMAL HIGH (ref 11.5–15.5)
WBC: 12.6 10*3/uL — AB (ref 4.0–10.5)

## 2013-12-10 LAB — LIPASE, BLOOD: Lipase: 11 U/L (ref 11–59)

## 2013-12-10 LAB — I-STAT CG4 LACTIC ACID, ED: Lactic Acid, Venous: 2.01 mmol/L (ref 0.5–2.2)

## 2013-12-10 MED ORDER — IOHEXOL 300 MG/ML  SOLN
50.0000 mL | Freq: Once | INTRAMUSCULAR | Status: AC | PRN
  Administered 2013-12-10: 50 mL via ORAL

## 2013-12-10 MED ORDER — SODIUM CHLORIDE 0.9 % IV BOLUS (SEPSIS)
1000.0000 mL | Freq: Once | INTRAVENOUS | Status: AC
  Administered 2013-12-10: 1000 mL via INTRAVENOUS

## 2013-12-10 MED ORDER — ALPRAZOLAM 0.5 MG PO TABS
0.5000 mg | ORAL_TABLET | Freq: Two times a day (BID) | ORAL | Status: DC | PRN
  Administered 2013-12-10 – 2013-12-12 (×3): 0.5 mg via ORAL
  Filled 2013-12-10 (×3): qty 1

## 2013-12-10 MED ORDER — CIPROFLOXACIN IN D5W 400 MG/200ML IV SOLN
400.0000 mg | Freq: Two times a day (BID) | INTRAVENOUS | Status: DC
  Administered 2013-12-11 – 2013-12-12 (×5): 400 mg via INTRAVENOUS
  Filled 2013-12-10 (×5): qty 200

## 2013-12-10 MED ORDER — MORPHINE SULFATE 2 MG/ML IJ SOLN
2.0000 mg | INTRAMUSCULAR | Status: DC | PRN
  Administered 2013-12-11 – 2013-12-12 (×5): 2 mg via INTRAVENOUS
  Filled 2013-12-10 (×5): qty 1

## 2013-12-10 MED ORDER — BOOST PLUS PO LIQD
237.0000 mL | Freq: Three times a day (TID) | ORAL | Status: DC
  Administered 2013-12-11 – 2013-12-12 (×3): 237 mL via ORAL
  Filled 2013-12-10 (×6): qty 237

## 2013-12-10 MED ORDER — IOHEXOL 300 MG/ML  SOLN
100.0000 mL | Freq: Once | INTRAMUSCULAR | Status: AC | PRN
  Administered 2013-12-10: 100 mL via INTRAVENOUS

## 2013-12-10 MED ORDER — HEPARIN SODIUM (PORCINE) 5000 UNIT/ML IJ SOLN
5000.0000 [IU] | Freq: Three times a day (TID) | INTRAMUSCULAR | Status: DC
  Administered 2013-12-10 – 2013-12-12 (×7): 5000 [IU] via SUBCUTANEOUS
  Filled 2013-12-10 (×8): qty 1

## 2013-12-10 MED ORDER — METRONIDAZOLE IN NACL 5-0.79 MG/ML-% IV SOLN
500.0000 mg | Freq: Three times a day (TID) | INTRAVENOUS | Status: DC
  Administered 2013-12-10 – 2013-12-11 (×2): 500 mg via INTRAVENOUS
  Filled 2013-12-10 (×4): qty 100

## 2013-12-10 MED ORDER — ONDANSETRON HCL 4 MG PO TABS: 4.0000 mg | ORAL_TABLET | Freq: Four times a day (QID) | ORAL | Status: DC | PRN

## 2013-12-10 MED ORDER — ADULT MULTIVITAMIN W/MINERALS CH
1.0000 | ORAL_TABLET | Freq: Every day | ORAL | Status: DC
  Administered 2013-12-11 – 2013-12-12 (×2): 1 via ORAL
  Filled 2013-12-10 (×2): qty 1

## 2013-12-10 MED ORDER — SODIUM CHLORIDE 0.9 % IV SOLN
INTRAVENOUS | Status: DC
  Administered 2013-12-10 – 2013-12-12 (×4): via INTRAVENOUS

## 2013-12-10 MED ORDER — ACETAMINOPHEN 650 MG RE SUPP: 650.0000 mg | Freq: Four times a day (QID) | RECTAL | Status: DC | PRN

## 2013-12-10 MED ORDER — ONDANSETRON HCL 4 MG/2ML IJ SOLN: 4.0000 mg | Freq: Four times a day (QID) | INTRAMUSCULAR | Status: DC | PRN

## 2013-12-10 MED ORDER — ACETAMINOPHEN 325 MG PO TABS: 650.0000 mg | ORAL_TABLET | Freq: Four times a day (QID) | ORAL | Status: DC | PRN

## 2013-12-10 MED ORDER — ONDANSETRON HCL 4 MG/2ML IJ SOLN
4.0000 mg | Freq: Once | INTRAMUSCULAR | Status: DC
  Filled 2013-12-10: qty 2

## 2013-12-10 MED ORDER — OXYCODONE-ACETAMINOPHEN 5-325 MG PO TABS
2.0000 | ORAL_TABLET | Freq: Four times a day (QID) | ORAL | Status: DC | PRN
  Administered 2013-12-10 – 2013-12-12 (×7): 2 via ORAL
  Filled 2013-12-10 (×7): qty 2

## 2013-12-10 MED ORDER — FERROUS SULFATE 325 (65 FE) MG PO TABS
325.0000 mg | ORAL_TABLET | Freq: Two times a day (BID) | ORAL | Status: DC
  Administered 2013-12-11 – 2013-12-12 (×4): 325 mg via ORAL
  Filled 2013-12-10 (×5): qty 1

## 2013-12-10 MED ORDER — SODIUM CHLORIDE 0.9 % IV BOLUS (SEPSIS): 1000.0000 mL | Freq: Once | INTRAVENOUS | Status: DC

## 2013-12-10 NOTE — ED Notes (Signed)
Pt was told to take a laxative due to hard stool, since thursday pt has been having stool from his colostomy bag and rectum. Pt also states that since May 27 he has been having feces come out of penis.

## 2013-12-10 NOTE — ED Provider Notes (Signed)
Patient not in room  Ezequiel Essex, MD 12/10/13 1801

## 2013-12-10 NOTE — ED Provider Notes (Signed)
CSN: [id number]     Arrival date & time 12/10/13  1539 History   First MD Initiated Contact with Patient 12/10/13 1734     Chief Complaint  Patient presents with  . colostomy problems    . Diarrhea     (Consider location/radiation/quality/duration/timing/severity/associated sxs/prior Treatment) HPI Comments: Patient presents with diarrhea and increased pain along his colostomy. He has a history of colon cancer with colostomy placed in may. He saw the surgeon in the office on July 23 and was told he was impacted in his stoma. He says the stoma has been retracting below the skin surface for quite some time. Since Saturday has been having diarrhea but from his stoma as well as his anus. He normally does not produce stool from his anus. 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 He denies any fever, nausea or vomiting. Temp is 100.1 in the ED. He was told to come the ED by his surgeon Dr. Ninfa Linden. He is scheduled to begin chemotherapy and radiation tomorrow.  The history is provided by the patient and the spouse.    Past Medical History  Diagnosis Date  . Fecal incontinence    Past Surgical History  Procedure Laterality Date  . Mandible surgery  1983  . Colonoscopy N/A 10/11/2013    Procedure: COLONOSCOPY;  Surgeon: [name], MD;  Location: [facility];  Service: Endoscopy;  Laterality: N/A;  . Colon resection N/A 10/15/2013    Procedure: LAP ASSISTED  LOOP COLOSTOMY;  Surgeon: Harl Bowie, MD;  Location: [facility];  Service: General;  Laterality: N/A;   No family history on file. History  Substance Use Topics  . Smoking status: Former Smoker    Quit date: 05/16/1988  . Smokeless tobacco: Current User    Types: Snuff  . Alcohol Use: Yes     Comment: OCCASIONAL     Review of Systems  Constitutional: Positive for activity change, appetite change and fatigue. Negative for fever.  HENT: Negative for congestion and rhinorrhea.   Eyes: Negative for  visual disturbance.  Respiratory: Negative for cough and chest tightness.   Cardiovascular: Negative for chest pain.  Gastrointestinal: Positive for abdominal pain, diarrhea, blood in stool and rectal pain. Negative for nausea and vomiting.  Musculoskeletal: Positive for arthralgias and myalgias.  Neurological: Positive for weakness. Negative for dizziness and headaches.  A complete 10 system review of systems was obtained and all systems are negative except as noted in the HPI and PMH.      Allergies  Strawberry; Sunflower oil; and Watermelon  Home Medications   Prior to Admission medications   Medication Sig Start Date End Date Taking? Authorizing Provider  ALPRAZolam Duanne Moron) 0.5 MG tablet Take 1 tablet (0.5 mg total) by mouth 2 (two) times daily as needed for anxiety. 12/03/13  Yes Owens Shark, NP  ferrous sulfate 325 (65 FE) MG tablet Take 1 tablet (325 mg total) by mouth 2 (two) times daily with a meal. 10/20/13  Yes Nishant Dhungel, MD  lactose free nutrition (BOOST PLUS) LIQD Take 237 mLs by mouth 3 (three) times daily with meals. 10/20/13  Yes Nishant Dhungel, MD  Multiple Vitamin (MULTIVITAMIN WITH MINERALS) TABS tablet Take 1 tablet by mouth daily. 10/20/13  Yes Nishant Dhungel, MD  oxyCODONE-acetaminophen (PERCOCET/ROXICET) 5-325 MG per tablet Take 2 tablets by mouth every 6 (six) hours as needed for severe pain. 12/03/13  Yes Owens Shark, NP  capecitabine (XELODA) 500 MG tablet Take  3 tablets (1,500 mg total) by mouth 2 (two) times daily after a meal. On days of radiation only (Mon-Fri) 11/13/13   [name], MD   BP 122/72  Pulse 90  Temp(Src) 98.2 F (36.8 C) (Oral)  Resp 16  Ht 6\' 4"  (1.93 m)  Wt 138 lb 14.2 oz (63 kg)  BMI 16.91 kg/m2  SpO2 100% Physical Exam  Nursing note and vitals reviewed. Constitutional: He is oriented to person, place, and time. He appears well-developed. No distress.  Cachectic and malnourished  HENT:  Head: Normocephalic and atraumatic.   Mouth/Throat: Oropharynx is clear and moist. No oropharyngeal exudate.  Dry mucus membranes  Eyes: Conjunctivae and EOM are normal. Pupils are equal, round, and reactive to light.  Neck: Normal range of motion. Neck supple.  No meningismus.  Cardiovascular: Normal rate, regular rhythm, normal heart sounds and intact distal pulses.   No murmur heard. Pulmonary/Chest: Effort normal and breath sounds normal. No respiratory distress.  Abdominal: Soft. There is tenderness. There is no rebound and no guarding.  Left lower quadrant colostomy with stoma below the skin surface. Dark stool in bag. Diffuse abdominal tenderness, no peritoneal signs  Musculoskeletal: Normal range of motion. He exhibits no edema and no tenderness.  Neurological: He is alert and oriented to person, place, and time. No cranial nerve deficit. He exhibits normal muscle tone. Coordination normal.  No ataxia on finger to nose bilaterally. No pronator drift. 5/5 strength throughout. CN 2-12 intact. Negative Romberg. Equal grip strength. Sensation intact. Gait is normal.   Skin: Skin is warm.  Psychiatric: He has a normal mood and affect. His behavior is normal.    ED Course  Procedures (including critical care time) Labs Review Labs Reviewed  CBC WITH DIFFERENTIAL - Abnormal; Notable for the following:    WBC 12.6 (*)    Hemoglobin 9.7 (*)    HCT 31.0 (*)    MCV 73.3 (*)    MCH 22.9 (*)    RDW 18.3 (*)    Neutrophils Relative % 88 (*)    Neutro Abs 11.1 (*)    Lymphocytes Relative 7 (*)    All other components within normal limits  COMPREHENSIVE METABOLIC PANEL - Abnormal; Notable for the following:    Sodium 128 (*)    Chloride 89 (*)    Glucose, Bld 166 (*)    Albumin 2.5 (*)    GFR calc non Af Amer 81 (*)    Anion gap 16 (*)    All other components within normal limits  COMPREHENSIVE METABOLIC PANEL - Abnormal; Notable for the following:    Sodium 128 (*)    Chloride 90 (*)    Glucose, Bld 147 (*)     Albumin 2.2 (*)    All other components within normal limits  CBC WITH DIFFERENTIAL - Abnormal; Notable for the following:    WBC 12.9 (*)    RBC 3.98 (*)    Hemoglobin 9.3 (*)    HCT 29.7 (*)    MCV 74.6 (*)    MCH 23.4 (*)    RDW 18.4 (*)    Platelets 471 (*)    Neutrophils Relative % 87 (*)    Neutro Abs 11.2 (*)    Lymphocytes Relative 8 (*)    All other components within normal limits  COMPREHENSIVE METABOLIC PANEL - Abnormal; Notable for the following:    Sodium 132 (*)    Chloride 93 (*)    Glucose, Bld 117 (*)  Albumin 2.2 (*)    All other components within normal limits  URINE CULTURE  CLOSTRIDIUM DIFFICILE BY PCR  LIPASE, BLOOD  URINALYSIS, ROUTINE W REFLEX MICROSCOPIC  URINALYSIS, ROUTINE W REFLEX MICROSCOPIC  GI PATHOGEN PANEL BY PCR, STOOL  I-STAT CG4 LACTIC ACID, ED    Imaging Review Ct Abdomen Pelvis W Contrast  12/10/2013   CLINICAL DATA:  Colon cancer.  Evaluate for colovesical fistula.  EXAM: CT ABDOMEN AND PELVIS WITH CONTRAST  TECHNIQUE: Multidetector CT imaging of the abdomen and pelvis was performed using the standard protocol following bolus administration of intravenous contrast.  CONTRAST:  175mL OMNIPAQUE IOHEXOL 300 MG/ML SOLN, 61mL OMNIPAQUE IOHEXOL 300 MG/ML SOLN  COMPARISON:  CT 10/18/2013.  FINDINGS: No focal hepatic abnormality. The spleen is normal. Pancreas is normal. Biliary ductal system is nondistended. The gallbladder is distended. No pericholecystic fluid collections are noted. No gallstones identified.  The adrenals are normal. No focal renal parenchymal abnormality. Double-J right ureteral stent is noted. Stent is in good anatomic position. The bladder is decompressed. Air is noted throughout the bladder and right ureter/ renal collecting system. This could be from stent placement, infection, or enterovesical fistula formation. A large colorectal mass is again identified in this patient with known colorectal malignancy. The mass occupies  almost the entire pelvis. The mass has increased in size and measures 14 x 11 cm. Punctate areas of air noted throughout the mass. A linear collection of air in is noted arising from the anterior wall of the rectal mass and abutting the bladder. Again colovesical fistula may be present.  No significant adenopathy. Abdominal aorta is widely patent as are its visceral branches. Portal and splenic vein patent. Hepatic veins are patent.  Colostomy site left lower quadrant. No proximal bowel obstruction. Stool noted throughout the right colon. Stomach is nondistended. No free air. No pneumatosis. Large rectal mass has grade described above.  Heart size normal. Lung bases clear. No acute bony abnormality. Degenerative changes lumbar spine and both hips.  IMPRESSION: 1. Enlarging rectal mass. A rectal mass fills almost the entire pelvis measuring 14 x 11 cm. This is consistent with progressive local malignancy this patient with known colorectal cancer. 2. Air is noted in the bladder and renal collecting system. Although this could be related to stent placement infection and colovesical fistula could present in this fashion. A linear collection of air noted along the anterior wall of the colorectal mass abutting the bladder. This could represent a site of fistulization. 3. Double-J right ureteral stent in good anatomic position.   Electronically Signed   By: Marcello Moores  Register   On: 12/10/2013 21:09     EKG Interpretation None      MDM   Final diagnoses:  Dehydration  Colon cancer    Patient presents with diarrhea, abdominal pain, recent fecal impaction and surgery office. Denies any vomiting. Temp 100.1 on arrival. He appears pale and tachycardic.  Case discussed with Dr. Ninfa Linden. He recommends checking labs and likely admit to medicine service. He does not think there are any surgical options available due to patient's large tumor.  Labs show mild hyponatremia.  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 Increased diarrhea with dehydration on exam in patient with large rectal tumor.  Send stool for C dif.  Patient agreeable to admission for symptom control and hydration. D/w Dr. Posey Pronto.  Ezequiel Essex, MD 12/11/13 [phone]

## 2013-12-10 NOTE — Progress Notes (Signed)
ANTIBIOTIC CONSULT NOTE - INITIAL  Pharmacy Consult for Cipro Indication: UTI  Allergies  Allergen Reactions  . Strawberry Anaphylaxis and Hives  . Sunflower Oil Anaphylaxis and Hives    seeds  . Watermelon [Citrullus Vulgaris] Anaphylaxis and Hives    Patient Measurements: Height: 6\' 4"  (193 cm) Weight: 140 lb (63.504 kg) IBW/kg (Calculated) : 86.8 Adjusted Body Weight:   Vital Signs: Temp: 100.1 F (37.8 C) (07/28 1559) Temp src: Oral (07/28 1559) BP: 122/73 mmHg (07/28 1834) Pulse Rate: 106 (07/28 1834) Intake/Output from previous day:   Intake/Output from this shift:    Labs:  Recent Labs  12/10/13 1838  WBC 12.6*  HGB 9.7*  PLT 399  CREATININE 1.01   Estimated Creatinine Clearance: 73.3 ml/min (by C-G formula based on Cr of 1.01). No results found for this basename: VANCOTROUGH, Corlis Leak, VANCORANDOM, [place], GENTPEAK, GENTRANDOM, TOBRATROUGH, TOBRAPEAK, TOBRARND, AMIKACINPEAK, AMIKACINTROU, AMIKACIN,  in the last 72 hours   Microbiology: Recent Results (from the past 720 hour(s))  TECHNOLOGIST REVIEW     Status: None   Collection Time    11/13/13  9:34 AM      Result Value Ref Range Status   Technologist Review Few ovalos, occ teardrops and fragmented RBCs   Final    Medical History: Past Medical History  Diagnosis Date  . Fecal incontinence     Medications:  Anti-infectives   Start     Dose/Rate Route Frequency Ordered Stop   12/10/13 2300  ciprofloxacin (CIPRO) IVPB 400 mg     400 mg 200 mL/hr over 60 Minutes Intravenous 2 times daily 12/10/13 2255     12/10/13 2245  metroNIDAZOLE (FLAGYL) IVPB 500 mg     500 mg 100 mL/hr over 60 Minutes Intravenous Every 8 hours 12/10/13 2244       Assessment: Patient with UTI.  Goal of Therapy:  Cipro dosed based on patient weight and renal function   Plan:  Follow up culture results Cipro 400mg  iv q12hr  Tyler Deis, Shea Stakes [place] 12/10/2013,10:57 PM

## 2013-12-10 NOTE — ED Notes (Signed)
Report received from [facility],  Pt is here for feces in urine through penis

## 2013-12-10 NOTE — H&P (Signed)
[facility] History and Physical  Patient: Grant [name]  YPP:[id number]  DOB: Jul 18, 1955  DOS: the patient was seen and examined on 12/10/2013 PCP: No PCP Per Patient  Chief Complaint: Diarrhea  HPI: Grant [name] is a 58 y.o. male with Past medical history of rectal cancer with colovesical fistula, recent diverting colostomy, recent UTI, protein calorie malnutrition. Patient presented with complaints of diarrhea. He mentions that he had a colostomy done during his last admission one month ago and after that there was gradual decline in discharge from his anus as well as from his penis with stool. When he went to see his doctor they found that his colostomy was retracting and it may be impacted. He went to see Dr. Arnoldo Morale from surgery who recommended him to use stool softener and MiraLAX and an enema. After using the enema patient started having diarrhea with increasing loose motions from his colostomy bag. Since last few days he also started having increasing bowel motions from his anus as well as increasing discharge with feces from his penis. He had elevated temperature but felt feverish at home. Denies any nausea or vomiting denies any new abdominal pain. Denies any chest pain shortness of breath.  The patient is coming from home. And at his baseline independent for most of his ADL.  Review of Systems: as mentioned in the history of present illness.  A Comprehensive review of the other systems is negative.  Past Medical History  Diagnosis Date  . Fecal incontinence    Past Surgical History  Procedure Laterality Date  . Mandible surgery  1983  . Colonoscopy N/A 10/11/2013    Procedure: COLONOSCOPY;  Surgeon: [name], MD;  Location: [facility];  Service: Endoscopy;  Laterality: N/A;  . Colon resection N/A 10/15/2013    Procedure: LAP ASSISTED  LOOP COLOSTOMY;  Surgeon: Harl Bowie, MD;  Location: [facility];  Service: General;  Laterality: N/A;   Social History:   reports that he quit smoking about 25 years ago. His smokeless tobacco use includes Snuff. He reports that he drinks alcohol. He reports that he uses illicit drugs (Marijuana).  Allergies  Allergen Reactions  . Strawberry Anaphylaxis and Hives  . Sunflower Oil Anaphylaxis and Hives    seeds  . Watermelon [Citrullus Vulgaris] Anaphylaxis and Hives    No family history on file.  Prior to Admission medications   Medication Sig Start Date End Date Taking? Authorizing Provider  ALPRAZolam Duanne Moron) 0.5 MG tablet Take 1 tablet (0.5 mg total) by mouth 2 (two) times daily as needed for anxiety. 12/03/13  Yes Owens Shark, NP  ferrous sulfate 325 (65 FE) MG tablet Take 1 tablet (325 mg total) by mouth 2 (two) times daily with a meal. 10/20/13  Yes Nishant Dhungel, MD  lactose free nutrition (BOOST PLUS) LIQD Take 237 mLs by mouth 3 (three) times daily with meals. 10/20/13  Yes Nishant Dhungel, MD  Multiple Vitamin (MULTIVITAMIN WITH MINERALS) TABS tablet Take 1 tablet by mouth daily. 10/20/13  Yes Nishant Dhungel, MD  oxyCODONE-acetaminophen (PERCOCET/ROXICET) 5-325 MG per tablet Take 2 tablets by mouth every 6 (six) hours as needed for severe pain. 12/03/13  Yes Owens Shark, NP  capecitabine (XELODA) 500 MG tablet Take 3 tablets (1,500 mg total) by mouth 2 (two) times daily after a meal. On days of radiation only (Mon-Fri) 11/13/13   [name], MD    Physical Exam: Filed Vitals:   12/10/13 1559 12/10/13 1834  BP: 106/81 122/73  Pulse: 115 106  Temp: 100.1 F (37.8 C)   TempSrc: Oral   Resp: 18 18  SpO2: 99% 99%    General: Alert, Awake and Oriented to Time, Place and Person. Appear in mild distress Eyes: PERRL ENT: Oral Mucosa clear dry. Neck: No JVD Cardiovascular: S1 and S2 Present, no Murmur, Peripheral Pulses Present Respiratory: Bilateral Air entry equal and Decreased, Clear to Auscultation, no Crackles, no wheezes Abdomen: Bowel Sound Present, Soft and mild diffuse tender Skin: No  Rash Extremities: No Pedal edema, no calf tenderness Neurologic: Grossly no focal neuro deficit.  Labs on Admission:  CBC:  Recent Labs Lab 12/10/13 1838  WBC 12.6*  NEUTROABS 11.1*  HGB 9.7*  HCT 31.0*  MCV 73.3*  PLT 399    CMP     Component Value Date/Time   NA 128* 12/10/2013 1838   NA 134* 11/13/2013 0934   K 4.0 12/10/2013 1838   K 4.7 11/13/2013 0934   CL 89* 12/10/2013 1838   CO2 23 12/10/2013 1838   CO2 27 11/13/2013 0934   GLUCOSE 166* 12/10/2013 1838   GLUCOSE 194* 11/13/2013 0934   BUN 18 12/10/2013 1838   BUN 14.9 11/13/2013 0934   CREATININE 1.01 12/10/2013 1838   CREATININE 1.0 11/13/2013 0934   CALCIUM 9.5 12/10/2013 1838   CALCIUM 9.7 11/13/2013 0934   PROT 7.7 12/10/2013 1838   PROT 7.3 11/13/2013 0934   ALBUMIN 2.5* 12/10/2013 1838   ALBUMIN 2.7* 11/13/2013 0934   AST 14 12/10/2013 1838   AST 9 11/13/2013 0934   ALT 7 12/10/2013 1838   ALT 9 11/13/2013 0934   ALKPHOS 109 12/10/2013 1838   ALKPHOS 77 11/13/2013 0934   BILITOT 0.4 12/10/2013 1838   BILITOT 0.37 11/13/2013 0934   GFRNONAA 81* 12/10/2013 1838   GFRAA >90 12/10/2013 1838     Recent Labs Lab 12/10/13 1838  LIPASE 11   No results found for this basename: AMMONIA,  in the last 168 hours  No results found for this basename: CKTOTAL, CKMB, CKMBINDEX, TROPONINI,  in the last 168 hours BNP (last 3 results) No results found for this basename: PROBNP,  in the last 8760 hours  Radiological Exams on Admission: Ct Abdomen Pelvis W Contrast  12/10/2013   CLINICAL DATA:  Colon cancer.  Evaluate for colovesical fistula.  EXAM: CT ABDOMEN AND PELVIS WITH CONTRAST  TECHNIQUE: Multidetector CT imaging of the abdomen and pelvis was performed using the standard protocol following bolus administration of intravenous contrast.  CONTRAST:  179mL OMNIPAQUE IOHEXOL 300 MG/ML SOLN, 67mL OMNIPAQUE IOHEXOL 300 MG/ML SOLN  COMPARISON:  CT 10/18/2013.  FINDINGS: No focal hepatic abnormality. The spleen is normal. Pancreas is normal. Biliary  ductal system is nondistended. The gallbladder is distended. No pericholecystic fluid collections are noted. No gallstones identified.  The adrenals are normal. No focal renal parenchymal abnormality. Double-J right ureteral stent is noted. Stent is in good anatomic position. The bladder is decompressed. Air is noted throughout the bladder and right ureter/ renal collecting system. This could be from stent placement, infection, or enterovesical fistula formation. A large colorectal mass is again identified in this patient with known colorectal malignancy. The mass occupies almost the entire pelvis. The mass has increased in size and measures 14 x 11 cm. Punctate areas of air noted throughout the mass. A linear collection of air in is noted arising from the anterior wall of the rectal mass and abutting the bladder. Again colovesical fistula may be present.  No  significant adenopathy. Abdominal aorta is widely patent as are its visceral branches. Portal and splenic vein patent. Hepatic veins are patent.  Colostomy site left lower quadrant. No proximal bowel obstruction. Stool noted throughout the right colon. Stomach is nondistended. No free air. No pneumatosis. Large rectal mass has grade described above.  Heart size normal. Lung bases clear. No acute bony abnormality. Degenerative changes lumbar spine and both hips.  IMPRESSION: 1. Enlarging rectal mass. A rectal mass fills almost the entire pelvis measuring 14 x 11 cm. This is consistent with progressive local malignancy this patient with known colorectal cancer. 2. Air is noted in the bladder and renal collecting system. Although this could be related to stent placement infection and colovesical fistula could present in this fashion. A linear collection of air noted along the anterior wall of the colorectal mass abutting the bladder. This could represent a site of fistulization. 3. Double-J right ureteral stent in good anatomic position.   Electronically Signed    By: Marcello Moores  Register   On: 12/10/2013 21:09    Assessment/Plan Principal Problem:   Diarrhea Active Problems:   Colovesical fistula   Ureteral obstruction, right   Microcytic anemia   Rectal cancer   Dehydration   Hyponatremia   1. Diarrhea The patient is presenting with diarrhea. He was recently hospitalized and has undergone multiple GI procedures. He was also treated with oral Cipro and Flagyl for UTI and intra-abdominal infection recently. Now he presents with greenish diarrhea from his colostomy bag as well as bowel movements from his anus and penis. He has a known colovesical fistula with ureteral obstruction. He was found to have fever and some leukocytosis here in the ED. With this the patient will be admitted to hospital. I would treat him with Cipro and Flagyl for possible C. difficile as well as possible UTI recurrence. Gentle IV hydration will be continued. Surgery has been consulted will be following the patient. I will keep the patient n.p.o. after midnight in an elective surgery decides to perform a procedure with his colostomy since he continues to have bowel movements from his anus. Check for C. difficile and GI pathogen panel in her  2. Rectal cancer. Significantly increased in size from last CT scan. At present holding off on Xeloda.  3. Hyponatremia. Likely secondary to dehydration. Continue IV hydration and monitor sodium levels.  4. Microcytic anemia. Continue to monitor.  Consults: Surgery  DVT Prophylaxis: subcutaneous Heparin Nutrition: N.p.o. after midnight  Code Status: Full  Family Communication: Family was present at bedside, opportunity was given to ask question and all questions were answered satisfactorily at the time of interview. Disposition: Admitted to inpatient in med-surge unit.  Author: Berle Mull, MD [facility] Pager: [phone] 12/10/2013, 10:45 PM    If 7PM-7AM, please contact night-coverage [email or web address] Password  [email or web address]  **Disclaimer: This note may have been dictated with voice recognition software. Similar sounding words can inadvertently be transcribed and this note may contain transcription errors which may not have been corrected upon publication of note.**

## 2013-12-11 ENCOUNTER — Ambulatory Visit: Payer: Medicaid Other

## 2013-12-11 ENCOUNTER — Ambulatory Visit: Admission: RE | Admit: 2013-12-11 | Payer: Medicaid Other | Source: Ambulatory Visit | Admitting: Radiation Oncology

## 2013-12-11 ENCOUNTER — Telehealth: Payer: Self-pay | Admitting: *Deleted

## 2013-12-11 DIAGNOSIS — Z510 Encounter for antineoplastic radiation therapy: Secondary | ICD-10-CM | POA: Diagnosis not present

## 2013-12-11 LAB — COMPREHENSIVE METABOLIC PANEL
ALT: 6 U/L (ref 0–53)
AST: 10 U/L (ref 0–37)
Albumin: 2.2 g/dL — ABNORMAL LOW (ref 3.5–5.2)
Alkaline Phosphatase: 97 U/L (ref 39–117)
Anion gap: 15 (ref 5–15)
BILIRUBIN TOTAL: 0.3 mg/dL (ref 0.3–1.2)
BUN: 14 mg/dL (ref 6–23)
CHLORIDE: 93 meq/L — AB (ref 96–112)
CO2: 24 meq/L (ref 19–32)
CREATININE: 0.91 mg/dL (ref 0.50–1.35)
Calcium: 9.1 mg/dL (ref 8.4–10.5)
GLUCOSE: 117 mg/dL — AB (ref 70–99)
Potassium: 3.9 mEq/L (ref 3.7–5.3)
Sodium: 132 mEq/L — ABNORMAL LOW (ref 137–147)
Total Protein: 7 g/dL (ref 6.0–8.3)

## 2013-12-11 LAB — GI PATHOGEN PANEL BY PCR, STOOL
C DIFFICILE TOXIN A/B: NEGATIVE
CAMPYLOBACTER BY PCR: NEGATIVE
Cryptosporidium by PCR: NEGATIVE
E COLI (ETEC) LT/ST: NEGATIVE
E COLI (STEC): NEGATIVE
E coli 0157 by PCR: NEGATIVE
G lamblia by PCR: NEGATIVE
Norovirus GI/GII: NEGATIVE
Rotavirus A by PCR: NEGATIVE
Salmonella by PCR: NEGATIVE
Shigella by PCR: NEGATIVE

## 2013-12-11 LAB — CBC WITH DIFFERENTIAL/PLATELET
BASOS ABS: 0 10*3/uL (ref 0.0–0.1)
Basophils Relative: 0 % (ref 0–1)
EOS PCT: 0 % (ref 0–5)
Eosinophils Absolute: 0 10*3/uL (ref 0.0–0.7)
HCT: 29.7 % — ABNORMAL LOW (ref 39.0–52.0)
HEMOGLOBIN: 9.3 g/dL — AB (ref 13.0–17.0)
LYMPHS ABS: 1 10*3/uL (ref 0.7–4.0)
LYMPHS PCT: 8 % — AB (ref 12–46)
MCH: 23.4 pg — ABNORMAL LOW (ref 26.0–34.0)
MCHC: 31.3 g/dL (ref 30.0–36.0)
MCV: 74.6 fL — ABNORMAL LOW (ref 78.0–100.0)
MONOS PCT: 5 % (ref 3–12)
Monocytes Absolute: 0.6 10*3/uL (ref 0.1–1.0)
NEUTROS ABS: 11.2 10*3/uL — AB (ref 1.7–7.7)
Neutrophils Relative %: 87 % — ABNORMAL HIGH (ref 43–77)
Platelets: 471 10*3/uL — ABNORMAL HIGH (ref 150–400)
RBC: 3.98 MIL/uL — ABNORMAL LOW (ref 4.22–5.81)
RDW: 18.4 % — ABNORMAL HIGH (ref 11.5–15.5)
WBC: 12.9 10*3/uL — AB (ref 4.0–10.5)

## 2013-12-11 LAB — CLOSTRIDIUM DIFFICILE BY PCR: CDIFFPCR: NEGATIVE

## 2013-12-11 NOTE — Progress Notes (Addendum)
Patient ID: Grant [name], male   DOB: January 11, 1956, 58 y.o.   MRN: [id number]  Subjective: Grant [name] is a 58 year male who underwent a lap assisted loop colostomy by Dr. Ninfa Linden on 10/15/13 due to rectal cancer a large obstructing mass at 9cm from the anal verge.  He has advanced rectal cancer and has been followed by Dr. Benay Spice.  He also has a known colovesical fistula.  The patient reports being seen in our office due to a retracted stoma and constipation.  He was advised to administer an enema and start miralax.  He had great results with this, however, he then developed diarrhea from the ostomy and from his rectum.  He had a follow up with Dr. Ninfa Linden yesterday, but due to severity of diarrhea he came to [facility]. He also reports stool in his urine.   He has a negative c diff PCR.  Stool cultures are negative.  White count of 12.9K which has improved when compared to 1 week ago.  He is on cipro/flagyl.  He is hungry, wants to eat.  He continues to have loose stools.  He reports chills yesterday.  Appetite has been adequate.   Objective:  Vital signs:  Filed Vitals:   12/10/13 1834 12/10/13 2250 12/10/13 2304 12/11/13 0507  BP: 122/73  138/72 122/72  Pulse: 106  101 90  Temp:   98.3 F (36.8 C) 98.2 F (36.8 C)  TempSrc:   Oral Oral  Resp: 18   16  Height:  6' 4"  (1.93 m) 6' 4"  (1.93 m)   Weight:  140 lb (63.504 kg) 140 lb (63.504 kg) 138 lb 14.2 oz (63 kg)  SpO2: 99%  100% 100%    Last BM Date: 12/11/13  Intake/Output   Yesterday:  07/28 0701 - 07/29 0700 In: 1010 [I.V.:610; IV Piggyback:400] Out: -  This shift:    I/O last 3 completed shifts: In: 1010 [I.V.:610; IV Piggyback:400] Out: -     Physical Exam: General: Pt awake/alert/oriented x4 in no acute distress Chest:  No chest wall pain w good excursion CV:  Pulses intact.  Regular rhythm MS: Normal AROM mjr joints.  No obvious deformity Abdomen: Soft.  Nondistended. Tender to pelvic region.  Stoma is retracted.   There is stool in the ostomy.  No evidence of peritonitis.  No incarcerated hernias. Ext:  SCDs BLE.  No mjr edema.  No cyanosis Skin: No petechiae / purpura   Problem List:   Principal Problem:   Diarrhea Active Problems:   Colovesical fistula   Ureteral obstruction, right   Microcytic anemia   Rectal cancer   Dehydration   Hyponatremia    Results:   Labs: Results for orders placed during the hospital encounter of 12/10/13 (from the past 48 hour(s))  CBC WITH DIFFERENTIAL     Status: Abnormal   Collection Time    12/10/13  6:38 PM      Result Value Ref Range   WBC 12.6 (*) 4.0 - 10.5 K/uL   RBC 4.23  4.22 - 5.81 MIL/uL   Hemoglobin 9.7 (*) 13.0 - 17.0 g/dL   HCT 31.0 (*) 39.0 - 52.0 %   MCV 73.3 (*) 78.0 - 100.0 fL   MCH 22.9 (*) 26.0 - 34.0 pg   MCHC 31.3  30.0 - 36.0 g/dL   RDW 18.3 (*) 11.5 - 15.5 %   Platelets 399  150 - 400 K/uL   Neutrophils Relative % 88 (*) 43 - 77 %  Neutro Abs 11.1 (*) 1.7 - 7.7 K/uL   Lymphocytes Relative 7 (*) 12 - 46 %   Lymphs Abs 0.9  0.7 - 4.0 K/uL   Monocytes Relative 5  3 - 12 %   Monocytes Absolute 0.6  0.1 - 1.0 K/uL   Eosinophils Relative 0  0 - 5 %   Eosinophils Absolute 0.0  0.0 - 0.7 K/uL   Basophils Relative 0  0 - 1 %   Basophils Absolute 0.0  0.0 - 0.1 K/uL  COMPREHENSIVE METABOLIC PANEL     Status: Abnormal   Collection Time    12/10/13  6:38 PM      Result Value Ref Range   Sodium 128 (*) 137 - 147 mEq/L   Potassium 4.0  3.7 - 5.3 mEq/L   Chloride 89 (*) 96 - 112 mEq/L   CO2 23  19 - 32 mEq/L   Glucose, Bld 166 (*) 70 - 99 mg/dL   BUN 18  6 - 23 mg/dL   Creatinine, Ser 1.01  0.50 - 1.35 mg/dL   Calcium 9.5  8.4 - 10.5 mg/dL   Total Protein 7.7  6.0 - 8.3 g/dL   Albumin 2.5 (*) 3.5 - 5.2 g/dL   AST 14  0 - 37 U/L   ALT 7  0 - 53 U/L   Alkaline Phosphatase 109  39 - 117 U/L   Total Bilirubin 0.4  0.3 - 1.2 mg/dL   GFR calc non Af Amer 81 (*) >90 mL/min   GFR calc Af Amer >90  >90 mL/min   Comment: (NOTE)      The eGFR has been calculated using the CKD EPI equation.     This calculation has not been validated in all clinical situations.     eGFR's persistently <90 mL/min signify possible Chronic Kidney     Disease.   Anion gap 16 (*) 5 - 15  LIPASE, BLOOD     Status: None   Collection Time    12/10/13  6:38 PM      Result Value Ref Range   Lipase 11  11 - 59 U/L  I-STAT CG4 LACTIC ACID, ED     Status: None   Collection Time    12/10/13  6:50 PM      Result Value Ref Range   Lactic Acid, Venous 2.01  0.5 - 2.2 mmol/L  COMPREHENSIVE METABOLIC PANEL     Status: Abnormal   Collection Time    12/10/13 10:44 PM      Result Value Ref Range   Sodium 128 (*) 137 - 147 mEq/L   Potassium 4.2  3.7 - 5.3 mEq/L   Chloride 90 (*) 96 - 112 mEq/L   CO2 24  19 - 32 mEq/L   Glucose, Bld 147 (*) 70 - 99 mg/dL   BUN 16  6 - 23 mg/dL   Creatinine, Ser 0.95  0.50 - 1.35 mg/dL   Calcium 8.7  8.4 - 10.5 mg/dL   Total Protein 6.8  6.0 - 8.3 g/dL   Albumin 2.2 (*) 3.5 - 5.2 g/dL   AST 15  0 - 37 U/L   Comment: SLIGHT HEMOLYSIS     HEMOLYSIS AT THIS LEVEL MAY AFFECT RESULT   ALT 5  0 - 53 U/L   Alkaline Phosphatase 94  39 - 117 U/L   Total Bilirubin 0.4  0.3 - 1.2 mg/dL   GFR calc non Af Amer >90  >90 mL/min   GFR  calc Af Amer >90  >90 mL/min   Comment: (NOTE)     The eGFR has been calculated using the CKD EPI equation.     This calculation has not been validated in all clinical situations.     eGFR's persistently <90 mL/min signify possible Chronic Kidney     Disease.   Anion gap 14  5 - 15  CBC WITH DIFFERENTIAL     Status: Abnormal   Collection Time    12/11/13  6:41 AM      Result Value Ref Range   WBC 12.9 (*) 4.0 - 10.5 K/uL   RBC 3.98 (*) 4.22 - 5.81 MIL/uL   Hemoglobin 9.3 (*) 13.0 - 17.0 g/dL   HCT 29.7 (*) 39.0 - 52.0 %   MCV 74.6 (*) 78.0 - 100.0 fL   MCH 23.4 (*) 26.0 - 34.0 pg   MCHC 31.3  30.0 - 36.0 g/dL   RDW 18.4 (*) 11.5 - 15.5 %   Platelets 471 (*) 150 - 400 K/uL    Neutrophils Relative % 87 (*) 43 - 77 %   Neutro Abs 11.2 (*) 1.7 - 7.7 K/uL   Lymphocytes Relative 8 (*) 12 - 46 %   Lymphs Abs 1.0  0.7 - 4.0 K/uL   Monocytes Relative 5  3 - 12 %   Monocytes Absolute 0.6  0.1 - 1.0 K/uL   Eosinophils Relative 0  0 - 5 %   Eosinophils Absolute 0.0  0.0 - 0.7 K/uL   Basophils Relative 0  0 - 1 %   Basophils Absolute 0.0  0.0 - 0.1 K/uL  COMPREHENSIVE METABOLIC PANEL     Status: Abnormal   Collection Time    12/11/13  6:41 AM      Result Value Ref Range   Sodium 132 (*) 137 - 147 mEq/L   Potassium 3.9  3.7 - 5.3 mEq/L   Chloride 93 (*) 96 - 112 mEq/L   CO2 24  19 - 32 mEq/L   Glucose, Bld 117 (*) 70 - 99 mg/dL   BUN 14  6 - 23 mg/dL   Creatinine, Ser 0.91  0.50 - 1.35 mg/dL   Calcium 9.1  8.4 - 10.5 mg/dL   Total Protein 7.0  6.0 - 8.3 g/dL   Albumin 2.2 (*) 3.5 - 5.2 g/dL   AST 10  0 - 37 U/L   ALT 6  0 - 53 U/L   Alkaline Phosphatase 97  39 - 117 U/L   Total Bilirubin 0.3  0.3 - 1.2 mg/dL   GFR calc non Af Amer >90  >90 mL/min   GFR calc Af Amer >90  >90 mL/min   Comment: (NOTE)     The eGFR has been calculated using the CKD EPI equation.     This calculation has not been validated in all clinical situations.     eGFR's persistently <90 mL/min signify possible Chronic Kidney     Disease.   Anion gap 15  5 - 15  CLOSTRIDIUM DIFFICILE BY PCR     Status: None   Collection Time    12/11/13  8:40 AM      Result Value Ref Range   C difficile by pcr NEGATIVE  NEGATIVE   Comment: Performed at [facility]    Imaging / Studies: Ct Abdomen Pelvis W Contrast  12/10/2013   CLINICAL DATA:  Colon cancer.  Evaluate for colovesical fistula.  EXAM: CT ABDOMEN AND PELVIS WITH CONTRAST  TECHNIQUE: Multidetector CT imaging of the abdomen and pelvis was performed using the standard protocol following bolus administration of intravenous contrast.  CONTRAST:  145m OMNIPAQUE IOHEXOL 300 MG/ML SOLN, 572mOMNIPAQUE IOHEXOL 300 MG/ML SOLN  COMPARISON:   CT 10/18/2013.  FINDINGS: No focal hepatic abnormality. The spleen is normal. Pancreas is normal. Biliary ductal system is nondistended. The gallbladder is distended. No pericholecystic fluid collections are noted. No gallstones identified.  The adrenals are normal. No focal renal parenchymal abnormality. Double-J right ureteral stent is noted. Stent is in good anatomic position. The bladder is decompressed. Air is noted throughout the bladder and right ureter/ renal collecting system. This could be from stent placement, infection, or enterovesical fistula formation. A large colorectal mass is again identified in this patient with known colorectal malignancy. The mass occupies almost the entire pelvis. The mass has increased in size and measures 14 x 11 cm. Punctate areas of air noted throughout the mass. A linear collection of air in is noted arising from the anterior wall of the rectal mass and abutting the bladder. Again colovesical fistula may be present.  No significant adenopathy. Abdominal aorta is widely patent as are its visceral branches. Portal and splenic vein patent. Hepatic veins are patent.  Colostomy site left lower quadrant. No proximal bowel obstruction. Stool noted throughout the right colon. Stomach is nondistended. No free air. No pneumatosis. Large rectal mass has grade described above.  Heart size normal. Lung bases clear. No acute bony abnormality. Degenerative changes lumbar spine and both hips.  IMPRESSION: 1. Enlarging rectal mass. A rectal mass fills almost the entire pelvis measuring 14 x 11 cm. This is consistent with progressive local malignancy this patient with known colorectal cancer. 2. Air is noted in the bladder and renal collecting system. Although this could be related to stent placement infection and colovesical fistula could present in this fashion. A linear collection of air noted along the anterior wall of the colorectal mass abutting the bladder. This could represent a  site of fistulization. 3. Double-J right ureteral stent in good anatomic position.   Electronically Signed   By: ThMarcello MooresRegister   On: 12/10/2013 21:09    Medications / Allergies: per chart  Antibiotics: Anti-infectives   Start     Dose/Rate Route Frequency Ordered Stop   12/10/13 2300  ciprofloxacin (CIPRO) IVPB 400 mg     400 mg 200 mL/hr over 60 Minutes Intravenous 2 times daily 12/10/13 2255     12/10/13 2245  metroNIDAZOLE (FLAGYL) IVPB 500 mg     500 mg 100 mL/hr over 60 Minutes Intravenous Every 8 hours 12/10/13 2244        Assessment/Plan S/p diverting loop colostomy  10/15/13 Dr. BlNinfa Lindendvanced rectal cancer-chemo/radiation by Dr. MoLisbeth Renshawnd Dr. ShBenay Spiceoose stools Retracted stoma Colovesical fistula Severe Protein Calorie Malnutrition.  i suspect his loose stools are due to the enema and miralax.  c diff is negative.  He still looks constipated on CT scan and would not recommend giving any anti-diarrheals/motility.  The retracted stoma is likely from the enlarging mass and we recommend resuming chemo/radiation as soon as possible. We do not recommend surgical revision at this time.  We have discussed this with Dr. AlLeighton Ruffur colorectal specialist who recommends referral to a tertiary care facility.  We recommend treating his dehydration and discharging per primary team.     EmErby PianANWestern [facility] Hospitalur[facility] Pager [phone] [phone] 12/11/2013 12:13 PM

## 2013-12-11 NOTE — Progress Notes (Addendum)
[facility] PROGRESS NOTE  Herold Salguero POE:[id number] DOB: [birth year] DOA: 12/10/2013 PCP: No PCP Per Patient  Assessment/Plan: Principal Problem:   Diarrhea Active Problems:   Colovesical fistula   Ureteral obstruction, right   Microcytic anemia   Rectal cancer   Dehydration   Hyponatremia     S/p diverting loop colostomy 10/15/13 Dr. Ninfa Linden  Advanced rectal cancer-chemo/radiation by Dr. Lisbeth Renshaw and Dr. Benay Spice  Diarrhea rule out C. difficile Retracted stoma  Colovesical fistula Patient being followed by Gen. surgery outpatient Would appreciate if surgery would take over his care from the hospitalist service Have already requested surgery to do so He have nothing to offer him except IV hydration I also discussed this with Dr. Benay Spice   Diarrhea Noninfectious etiology Would recommend to discontinue Flagyl, may continue ciprofloxacin for possible urinary tract infection  Rectal cancer Holding radiation/chemotherapy until further decision from surgery  Hyponatremia.  Likely secondary to dehydration.  Continue IV hydration and monitor sodium levels.   4. Microcytic anemia.  Continue to monitor.    Code Status: full Family Communication: family updated about patient's clinical progress Disposition Plan:  As above    Brief narrative: 58 year male who underwent a lap assisted loop colostomy by Dr. Ninfa Linden on 10/15/13 due to rectal cancer a large obstructing mass at 9cm from the anal verge. He has advanced rectal cancer and has been followed by Dr. Benay Spice. He also has a known colovesical fistula. The patient reports being seen in our office due to a retracted stoma and constipation. He was advised to administer an enema and start miralax. He had great results with this, however, he then developed diarrhea from the ostomy and from his rectum. He had a follow up with Dr. Ninfa Linden yesterday, but due to severity of diarrhea he came to [facility]. He also reports stool  in his urine. He has a negative c diff PCR. Stool cultures are negative. White count of 12.9K which has improved when compared to 1 week ago. He is on cipro/flagyl. He is hungry, wants to eat. He continues to have loose stools. He reports chills yesterday. Appetite has been adequate.    Consultants: *General surgery  Procedures: None  Antibiotics: Cipro and Flagyl  HPI/Subjective: No diarrhea today, patient has low colostomy output  Objective: Filed Vitals:   12/10/13 1834 12/10/13 2250 12/10/13 2304 12/11/13 0507  BP: 122/73  138/72 122/72  Pulse: 106  101 90  Temp:   98.3 F (36.8 C) 98.2 F (36.8 C)  TempSrc:   Oral Oral  Resp: 18   16  Height:  6\' 4"  (1.93 m) 6\' 4"  (1.93 m)   Weight:  63.504 kg (140 lb) 63.504 kg (140 lb) 63 kg (138 lb 14.2 oz)  SpO2: 99%  100% 100%    Intake/Output Summary (Last 24 hours) at 12/11/13 1312 Last data filed at 12/11/13 0600  Gross per 24 hour  Intake   1010 ml  Output      0 ml  Net   1010 ml    Exam:  General: Pt awake/alert/oriented x4 in no acute distress  Chest: No chest wall pain w good excursion  CV: Pulses intact. Regular rhythm  MS: Normal AROM mjr joints. No obvious deformity  Abdomen: Soft. Nondistended. Tender to pelvic region. Stoma is retracted. There is stool in the ostomy. No evidence of peritonitis. No incarcerated hernias.  Ext: SCDs BLE. No mjr edema. No cyanosis  Skin: No petechiae / purpura      Data  Reviewed: Basic Metabolic Panel:  Recent Labs Lab 12/10/13 1838 12/10/13 2244 12/11/13 0641  NA 128* 128* 132*  K 4.0 4.2 3.9  CL 89* 90* 93*  CO2 23 24 24   GLUCOSE 166* 147* 117*  BUN 18 16 14   CREATININE 1.01 0.95 0.91  CALCIUM 9.5 8.7 9.1    Liver Function Tests:  Recent Labs Lab 12/10/13 1838 12/10/13 2244 12/11/13 0641  AST 14 15 10   ALT 7 5 6   ALKPHOS 109 94 97  BILITOT 0.4 0.4 0.3  PROT 7.7 6.8 7.0  ALBUMIN 2.5* 2.2* 2.2*    Recent Labs Lab 12/10/13 1838  LIPASE 11   No  results found for this basename: AMMONIA,  in the last 168 hours  CBC:  Recent Labs Lab 12/10/13 1838 12/11/13 0641  WBC 12.6* 12.9*  NEUTROABS 11.1* 11.2*  HGB 9.7* 9.3*  HCT 31.0* 29.7*  MCV 73.3* 74.6*  PLT 399 471*    Cardiac Enzymes: No results found for this basename: CKTOTAL, CKMB, CKMBINDEX, TROPONINI,  in the last 168 hours BNP (last 3 results) No results found for this basename: PROBNP,  in the last 8760 hours   CBG: No results found for this basename: GLUCAP,  in the last 168 hours  Recent Results (from the past 240 hour(s))  CLOSTRIDIUM DIFFICILE BY PCR     Status: None   Collection Time    12/11/13  8:40 AM      Result Value Ref Range Status   C difficile by pcr NEGATIVE  NEGATIVE Final   Comment: Performed at [facility]     Studies: Ct Abdomen Pelvis W Contrast  12/10/2013   CLINICAL DATA:  Colon cancer.  Evaluate for colovesical fistula.  EXAM: CT ABDOMEN AND PELVIS WITH CONTRAST  TECHNIQUE: Multidetector CT imaging of the abdomen and pelvis was performed using the standard protocol following bolus administration of intravenous contrast.  CONTRAST:  176mL OMNIPAQUE IOHEXOL 300 MG/ML SOLN, 19mL OMNIPAQUE IOHEXOL 300 MG/ML SOLN  COMPARISON:  CT 10/18/2013.  FINDINGS: No focal hepatic abnormality. The spleen is normal. Pancreas is normal. Biliary ductal system is nondistended. The gallbladder is distended. No pericholecystic fluid collections are noted. No gallstones identified.  The adrenals are normal. No focal renal parenchymal abnormality. Double-J right ureteral stent is noted. Stent is in good anatomic position. The bladder is decompressed. Air is noted throughout the bladder and right ureter/ renal collecting system. This could be from stent placement, infection, or enterovesical fistula formation. A large colorectal mass is again identified in this patient with known colorectal malignancy. The mass occupies almost the entire pelvis. The mass has  increased in size and measures 14 x 11 cm. Punctate areas of air noted throughout the mass. A linear collection of air in is noted arising from the anterior wall of the rectal mass and abutting the bladder. Again colovesical fistula may be present.  No significant adenopathy. Abdominal aorta is widely patent as are its visceral branches. Portal and splenic vein patent. Hepatic veins are patent.  Colostomy site left lower quadrant. No proximal bowel obstruction. Stool noted throughout the right colon. Stomach is nondistended. No free air. No pneumatosis. Large rectal mass has grade described above.  Heart size normal. Lung bases clear. No acute bony abnormality. Degenerative changes lumbar spine and both hips.  IMPRESSION: 1. Enlarging rectal mass. A rectal mass fills almost the entire pelvis measuring 14 x 11 cm. This is consistent with progressive local malignancy this patient with known colorectal cancer.  2. Air is noted in the bladder and renal collecting system. Although this could be related to stent placement infection and colovesical fistula could present in this fashion. A linear collection of air noted along the anterior wall of the colorectal mass abutting the bladder. This could represent a site of fistulization. 3. Double-J right ureteral stent in good anatomic position.   Electronically Signed   By: Marcello Moores  Register   On: 12/10/2013 21:09    Scheduled Meds: . ciprofloxacin  400 mg Intravenous BID  . ferrous sulfate  325 mg Oral BID WC  . heparin  5,000 Units Subcutaneous 3 times per day  . lactose free nutrition  237 mL Oral TID WC  . metronidazole  500 mg Intravenous Q8H  . multivitamin with minerals  1 tablet Oral Daily  . ondansetron  4 mg Intravenous Once  . sodium chloride  1,000 mL Intravenous Once   Continuous Infusions: . sodium chloride 100 mL/hr at 12/11/13 1132    Principal Problem:   Diarrhea Active Problems:   Colovesical fistula   Ureteral obstruction, right    Microcytic anemia   Rectal cancer   Dehydration   Hyponatremia    Time spent: 40 minutes   [facility] Pager [phone]. If 8PM-8AM, please contact night-coverage at [email or web address], password [facility] 12/11/2013, 1:12 PM  LOS: 1 day

## 2013-12-11 NOTE — Progress Notes (Signed)
Pt transported to 4th floor via stretcher and ambulated to bed. Significant other and friend at bedside, no distress noted. VSS. Will continue to monitor pt closely. Carnella Guadalaj[place] I

## 2013-12-11 NOTE — Progress Notes (Signed)
Seen, agree with above. Concerned about retraction of lower loop of ostomy secondary to tumor growth.   Looks like not frank diarrhea.  He probably got an impacted piece of stool out with enema and has back up now all coming out.   Reviewed with Dr. Marcello Moores.   With bladder invasion and hydronephrosis, increase in tumor, she and I prefer that she go to tertiary care center.

## 2013-12-11 NOTE — Telephone Encounter (Signed)
Called the  4th floor and spoke with Rolanda Lundborg, asked if patient can come for 2 pm port and treatment rad/onc, per Erasmo Downer she hasn't spoken with the MD yet will call me back at [phone] and update status 10:12 AM

## 2013-12-11 NOTE — Progress Notes (Signed)
INITIAL NUTRITION ASSESSMENT  DOCUMENTATION CODES Per approved criteria  -Severe malnutrition in the context of chronic illness -Underweight  Pt meets criteria for severe MALNUTRITION in the context of chronic illness as evidenced by PO intake <75% for > one month, >5% body weight loss in one month.   INTERVENTION: -Continue to recommend Boost Plus TID -Encouraged intake of low fiber/low residue diet  -Will continue to monitor  NUTRITION DIAGNOSIS: Inadequate oral intake related to loose stools as evidenced by PO intake < 75%, wt loss.   Goal: Pt to meet >/= 90% of their estimated nutrition needs    Monitor:  Total protein/energy intake, labs, weights, GI profile  Reason for Assessment: MST  58 y.o. male  Admitting Dx: Diarrhea  ASSESSMENT: Patient presented with complaints of diarrhea. He mentions that he had a colostomy done during his last admission one month ago and after that there was gradual decline in discharge from his anus as well as from his penis with stool. When he went to see his doctor they found that his colostomy was retracting and it may be impacted. He went to see Dr. Arnoldo Morale from surgery who recommended him to use stool softener and MiraLAX and an enema. After using the enema patient started having diarrhea with increasing loose motions from his colostomy bag. Since last few days he also started having increasing bowel motions from his anus as well as increasing discharge with feces from his penis.  -Pt in restroom during time of RD assessment, information gathered from pt's wife -Pt's appetite has been decreased for past month d/t constipation and with onset of diarrhea.  -PO intake of solid foods has been minimal, diet has largely consisted of Boost Plus supplements, which pt has been able to consume 2-3/daily -Was NPO, diet advanced to Soft just prior to RD assessment. Wife encouraging pt to order lunch. Discussed low fiber/residue diet, with the  incorporation of fiber foods as tolerated. Reviewing "binding fiber" foods -Pt was evaluated by outpatient oncology RD on 10/2013, who recommended pt modify supplement regimen of Boost High Protein to Boost Complete for additional nutrient replenishment, which pt has been complying to. Reviewed nutrition therapy for colostomy -Has experienced an unintentional wt loss of 8 lbs in one month (5.4% body weight loss, severe for time frame)   Height: Ht Readings from Last 1 Encounters:  12/10/13 6\' 4"  (1.93 m)    Weight: Wt Readings from Last 1 Encounters:  12/11/13 138 lb 14.2 oz (63 kg)    Ideal Body Weight: 202 lbs  % Ideal Body Weight: 685  Wt Readings from Last 10 Encounters:  12/11/13 138 lb 14.2 oz (63 kg)  12/05/13 140 lb (63.504 kg)  12/04/13 139 lb 11.2 oz (63.368 kg)  12/03/13 139 lb 12.8 oz (63.413 kg)  11/11/13 146 lb (66.225 kg)  11/08/13 146 lb 12.8 oz (66.588 kg)  10/18/13 158 lb (71.668 kg)  10/18/13 158 lb (71.668 kg)  10/18/13 158 lb (71.668 kg)    Usual Body Weight: 180-200 lbs pre cancer dx  % Usual Body Weight: 77%  BMI:  Body mass index is 16.91 kg/(m^2). Underweight  Estimated Nutritional Needs: Kcal: 2200-2400 Protein: 100-110 Fluid: >/=2200 ml/daily  Skin: WDL  Diet Order: Criss Rosales  EDUCATION NEEDS: -No education needs identified at this time   Intake/Output Summary (Last 24 hours) at 12/11/13 1453 Last data filed at 12/11/13 0600  Gross per 24 hour  Intake   1010 ml  Output      0  ml  Net   1010 ml    Last BM: 7/29   Labs:   Recent Labs Lab 12/10/13 1838 12/10/13 2244 12/11/13 0641  NA 128* 128* 132*  K 4.0 4.2 3.9  CL 89* 90* 93*  CO2 23 24 24   BUN 18 16 14   CREATININE 1.01 0.95 0.91  CALCIUM 9.5 8.7 9.1  GLUCOSE 166* 147* 117*    CBG (last 3)  No results found for this basename: GLUCAP,  in the last 72 hours  Scheduled Meds: . ciprofloxacin  400 mg Intravenous BID  . ferrous sulfate  325 mg Oral BID WC  . heparin   5,000 Units Subcutaneous 3 times per day  . lactose free nutrition  237 mL Oral TID WC  . multivitamin with minerals  1 tablet Oral Daily  . ondansetron  4 mg Intravenous Once  . sodium chloride  1,000 mL Intravenous Once    Continuous Infusions: . sodium chloride 100 mL/hr at 12/11/13 1132    Past Medical History  Diagnosis Date  . Fecal incontinence     Past Surgical History  Procedure Laterality Date  . Mandible surgery  1983  . Colonoscopy N/A 10/11/2013    Procedure: COLONOSCOPY;  Surgeon: [name], MD;  Location: [facility];  Service: Endoscopy;  Laterality: N/A;  . Colon resection N/A 10/15/2013    Procedure: LAP ASSISTED  LOOP COLOSTOMY;  Surgeon: Harl Bowie, MD;  Location: [facility];  Service: General;  Laterality: N/A;    Atlee Abide MS RD LDN Clinical Dietitian [phone]

## 2013-12-12 ENCOUNTER — Telehealth: Payer: Self-pay | Admitting: *Deleted

## 2013-12-12 ENCOUNTER — Ambulatory Visit: Payer: Medicaid Other

## 2013-12-12 DIAGNOSIS — D509 Iron deficiency anemia, unspecified: Secondary | ICD-10-CM

## 2013-12-12 DIAGNOSIS — N1330 Unspecified hydronephrosis: Secondary | ICD-10-CM

## 2013-12-12 DIAGNOSIS — N3289 Other specified disorders of bladder: Secondary | ICD-10-CM

## 2013-12-12 LAB — CBC
HCT: 24.3 % — ABNORMAL LOW (ref 39.0–52.0)
Hemoglobin: 7.6 g/dL — ABNORMAL LOW (ref 13.0–17.0)
MCH: 23 pg — ABNORMAL LOW (ref 26.0–34.0)
MCHC: 31.3 g/dL (ref 30.0–36.0)
MCV: 73.4 fL — AB (ref 78.0–100.0)
PLATELETS: 328 10*3/uL (ref 150–400)
RBC: 3.31 MIL/uL — ABNORMAL LOW (ref 4.22–5.81)
RDW: 18.4 % — AB (ref 11.5–15.5)
WBC: 7.9 10*3/uL (ref 4.0–10.5)

## 2013-12-12 LAB — URINALYSIS, ROUTINE W REFLEX MICROSCOPIC
BILIRUBIN URINE: NEGATIVE
Glucose, UA: NEGATIVE mg/dL
Ketones, ur: NEGATIVE mg/dL
Nitrite: NEGATIVE
PH: 6 (ref 5.0–8.0)
Protein, ur: 100 mg/dL — AB
Specific Gravity, Urine: 1.018 (ref 1.005–1.030)
Urobilinogen, UA: 0.2 mg/dL (ref 0.0–1.0)

## 2013-12-12 LAB — COMPREHENSIVE METABOLIC PANEL
ALBUMIN: 1.9 g/dL — AB (ref 3.5–5.2)
ALT: <5 U/L (ref 0–53)
AST: 9 U/L (ref 0–37)
Alkaline Phosphatase: 82 U/L (ref 39–117)
Anion gap: 12 (ref 5–15)
BILIRUBIN TOTAL: 0.3 mg/dL (ref 0.3–1.2)
BUN: 11 mg/dL (ref 6–23)
CHLORIDE: 99 meq/L (ref 96–112)
CO2: 23 mEq/L (ref 19–32)
CREATININE: 0.9 mg/dL (ref 0.50–1.35)
Calcium: 8.4 mg/dL (ref 8.4–10.5)
GFR calc Af Amer: >90 mL/min (ref >90)
GFR calc non Af Amer: >90 mL/min (ref >90)
Glucose, Bld: 166 mg/dL — ABNORMAL HIGH (ref 70–99)
Potassium: 3.6 mEq/L — ABNORMAL LOW (ref 3.7–5.3)
SODIUM: 134 meq/L — AB (ref 137–147)
Total Protein: 6 g/dL (ref 6.0–8.3)

## 2013-12-12 LAB — PREPARE RBC (CROSSMATCH)

## 2013-12-12 LAB — URINE MICROSCOPIC-ADD ON

## 2013-12-12 LAB — ABO/RH: ABO/RH(D): A POS

## 2013-12-12 MED ORDER — OXYCODONE-ACETAMINOPHEN 5-325 MG PO TABS
2.0000 | ORAL_TABLET | ORAL | Status: DC | PRN
  Administered 2013-12-12 (×2): 2 via ORAL
  Filled 2013-12-12 (×2): qty 2

## 2013-12-12 NOTE — Progress Notes (Signed)
Agree with above.  We will try to transfer to [facility] for further surgical care.

## 2013-12-12 NOTE — Progress Notes (Signed)
Patient ID: Grant [name], male   DOB: 24-Mar-1956, 58 y.o.   MRN: [id number]  Subjective: Continues to have loose stools per rectum and per ostomy.  Confused about whether he's starting chemo today.   Objective:  Vital signs:  Filed Vitals:   12/11/13 0507 12/11/13 1340 12/11/13 2242 12/12/13 0534  BP: 122/72 131/68 123/74 134/90  Pulse: 90 82 93 94  Temp: 98.2 F (36.8 C) 98.3 F (36.8 C) 98.3 F (36.8 C) 98 F (36.7 C)  TempSrc: Oral Oral Oral Oral  Resp: _0 Height:      Weight: 138 lb 14.2 oz (63 kg)   142 lb (64.411 kg)  SpO2: 100% 100% 99% 100%    Last BM Date: 12/11/13  Intake/Output   Yesterday:  07/29 0701 - 07/30 0700 In: 3880 [P.O.:1080; I.V.:2400; IV Piggyback:400] Out: 150 [Urine:150] This shift:    I/O last 3 completed shifts: In: 1660 [P.O.:1080; I.V.:3010; IV Piggyback:800] Out: 150 [Urine:150]   Physical Exam:  General: Pt awake/alert/oriented x4 in no acute distress  Abdomen: Soft. Nondistended. Tender to pelvic region. Stoma is retracted. 2 large stool balls and loose dark stool in bag.  No evidence of peritonitis. No incarcerated hernias.   Problem List:   Principal Problem:   Diarrhea Active Problems:   Colovesical fistula   Ureteral obstruction, right   Microcytic anemia   Rectal cancer   Dehydration   Hyponatremia    Results:   Labs: Results for orders placed during the hospital encounter of 12/10/13 (from the past 48 hour(s))  CBC WITH DIFFERENTIAL     Status: Abnormal   Collection Time    12/10/13  6:38 PM      Result Value Ref Range   WBC 12.6 (*) 4.0 - 10.5 K/uL   RBC 4.23  4.22 - 5.81 MIL/uL   Hemoglobin 9.7 (*) 13.0 - 17.0 g/dL   HCT 31.0 (*) 39.0 - 52.0 %   MCV 73.3 (*) 78.0 - 100.0 fL   MCH 22.9 (*) 26.0 - 34.0 pg   MCHC 31.3  30.0 - 36.0 g/dL   RDW 18.3 (*) 11.5 - 15.5 %   Platelets 399  150 - 400 K/uL   Neutrophils Relative % 88 (*) 43 - 77 %   Neutro Abs 11.1 (*) 1.7 - 7.7 K/uL   Lymphocytes  Relative 7 (*) 12 - 46 %   Lymphs Abs 0.9  0.7 - 4.0 K/uL   Monocytes Relative 5  3 - 12 %   Monocytes Absolute 0.6  0.1 - 1.0 K/uL   Eosinophils Relative 0  0 - 5 %   Eosinophils Absolute 0.0  0.0 - 0.7 K/uL   Basophils Relative 0  0 - 1 %   Basophils Absolute 0.0  0.0 - 0.1 K/uL  COMPREHENSIVE METABOLIC PANEL     Status: Abnormal   Collection Time    12/10/13  6:38 PM      Result Value Ref Range   Sodium 128 (*) 137 - 147 mEq/L   Potassium 4.0  3.7 - 5.3 mEq/L   Chloride 89 (*) 96 - 112 mEq/L   CO2 23  19 - 32 mEq/L   Glucose, Bld 166 (*) 70 - 99 mg/dL   BUN 18  6 - 23 mg/dL   Creatinine, Ser 1.01  0.50 - 1.35 mg/dL   Calcium 9.5  8.4 - 10.5 mg/dL   Total Protein 7.7  6.0 - 8.3 g/dL   Albumin 2.5 (*)  3.5 - 5.2 g/dL   AST 14  0 - 37 U/L   ALT 7  0 - 53 U/L   Alkaline Phosphatase 109  39 - 117 U/L   Total Bilirubin 0.4  0.3 - 1.2 mg/dL   GFR calc non Af Amer 81 (*) >90 mL/min   GFR calc Af Amer >90  >90 mL/min   Comment: (NOTE)     The eGFR has been calculated using the CKD EPI equation.     This calculation has not been validated in all clinical situations.     eGFR's persistently <90 mL/min signify possible Chronic Kidney     Disease.   Anion gap 16 (*) 5 - 15  LIPASE, BLOOD     Status: None   Collection Time    12/10/13  6:38 PM      Result Value Ref Range   Lipase 11  11 - 59 U/L  I-STAT CG4 LACTIC ACID, ED     Status: None   Collection Time    12/10/13  6:50 PM      Result Value Ref Range   Lactic Acid, Venous 2.01  0.5 - 2.2 mmol/L  COMPREHENSIVE METABOLIC PANEL     Status: Abnormal   Collection Time    12/10/13 10:44 PM      Result Value Ref Range   Sodium 128 (*) 137 - 147 mEq/L   Potassium 4.2  3.7 - 5.3 mEq/L   Chloride 90 (*) 96 - 112 mEq/L   CO2 24  19 - 32 mEq/L   Glucose, Bld 147 (*) 70 - 99 mg/dL   BUN 16  6 - 23 mg/dL   Creatinine, Ser 0.95  0.50 - 1.35 mg/dL   Calcium 8.7  8.4 - 10.5 mg/dL   Total Protein 6.8  6.0 - 8.3 g/dL   Albumin 2.2 (*)  3.5 - 5.2 g/dL   AST 15  0 - 37 U/L   Comment: SLIGHT HEMOLYSIS     HEMOLYSIS AT THIS LEVEL MAY AFFECT RESULT   ALT 5  0 - 53 U/L   Alkaline Phosphatase 94  39 - 117 U/L   Total Bilirubin 0.4  0.3 - 1.2 mg/dL   GFR calc non Af Amer >90  >90 mL/min   GFR calc Af Amer >90  >90 mL/min   Comment: (NOTE)     The eGFR has been calculated using the CKD EPI equation.     This calculation has not been validated in all clinical situations.     eGFR's persistently <90 mL/min signify possible Chronic Kidney     Disease.   Anion gap 14  5 - 15  CBC WITH DIFFERENTIAL     Status: Abnormal   Collection Time    12/11/13  6:41 AM      Result Value Ref Range   WBC 12.9 (*) 4.0 - 10.5 K/uL   RBC 3.98 (*) 4.22 - 5.81 MIL/uL   Hemoglobin 9.3 (*) 13.0 - 17.0 g/dL   HCT 29.7 (*) 39.0 - 52.0 %   MCV 74.6 (*) 78.0 - 100.0 fL   MCH 23.4 (*) 26.0 - 34.0 pg   MCHC 31.3  30.0 - 36.0 g/dL   RDW 18.4 (*) 11.5 - 15.5 %   Platelets 471 (*) 150 - 400 K/uL   Neutrophils Relative % 87 (*) 43 - 77 %   Neutro Abs 11.2 (*) 1.7 - 7.7 K/uL   Lymphocytes Relative 8 (*) 12 - 46 %  Lymphs Abs 1.0  0.7 - 4.0 K/uL   Monocytes Relative 5  3 - 12 %   Monocytes Absolute 0.6  0.1 - 1.0 K/uL   Eosinophils Relative 0  0 - 5 %   Eosinophils Absolute 0.0  0.0 - 0.7 K/uL   Basophils Relative 0  0 - 1 %   Basophils Absolute 0.0  0.0 - 0.1 K/uL  COMPREHENSIVE METABOLIC PANEL     Status: Abnormal   Collection Time    12/11/13  6:41 AM      Result Value Ref Range   Sodium 132 (*) 137 - 147 mEq/L   Potassium 3.9  3.7 - 5.3 mEq/L   Chloride 93 (*) 96 - 112 mEq/L   CO2 24  19 - 32 mEq/L   Glucose, Bld 117 (*) 70 - 99 mg/dL   BUN 14  6 - 23 mg/dL   Creatinine, Ser 0.91  0.50 - 1.35 mg/dL   Calcium 9.1  8.4 - 10.5 mg/dL   Total Protein 7.0  6.0 - 8.3 g/dL   Albumin 2.2 (*) 3.5 - 5.2 g/dL   AST 10  0 - 37 U/L   ALT 6  0 - 53 U/L   Alkaline Phosphatase 97  39 - 117 U/L   Total Bilirubin 0.3  0.3 - 1.2 mg/dL   GFR calc non Af  Amer >90  >90 mL/min   GFR calc Af Amer >90  >90 mL/min   Comment: (NOTE)     The eGFR has been calculated using the CKD EPI equation.     This calculation has not been validated in all clinical situations.     eGFR's persistently <90 mL/min signify possible Chronic Kidney     Disease.   Anion gap 15  5 - 15  GI PATHOGEN PANEL BY PCR, STOOL     Status: None   Collection Time    12/11/13  8:40 AM      Result Value Ref Range   Campylobacter by PCR Negative     C difficile toxin A/B Negative     E coli 0157 by PCR Negative     E coli (ETEC) LT/ST Negative     E coli (STEC) Negative     Salmonella by PCR Negative     Shigella by PCR Negative     Norovirus G!/G2 Negative     Rotavirus A by PCR Negative     G lamblia by PCR Negative     Cryptosporidium by PCR Negative     Comment: (NOTE)      ** Normal Reference Range for each Analyte: Not Detected **           The detection and identification of specific gastrointestinal     microbial nucleic acid from individuals exhibiting signs and     symptoms of gastrointestinal infection aids in the diagnosis     of gastrointestinal infection when used in conjunction with     clinical evaluation, laboratory findings, and epidemiological     information.     ** The xTAG Gastrointestinal Pathogen Panel results are presumptive     and must be confirmed by FDA-cleared tests or other acceptable     reference methods.  The results of this test should not be used as     the sole basis for diagnosis, treatment, or other patient management     decisions.     Performed using the [employer] xTAG Gastrointestinal Pathogen Panel test  kit.     Performed at [facility] DIFFICILE BY PCR     Status: None   Collection Time    12/11/13  8:40 AM      Result Value Ref Range   C difficile by pcr NEGATIVE  NEGATIVE   Comment: Performed at [place], ROUTINE W REFLEX MICROSCOPIC     Status: Abnormal   Collection  Time    12/12/13  1:19 AM      Result Value Ref Range   Color, Urine YELLOW  YELLOW   APPearance TURBID (*) CLEAR   Specific Gravity, Urine 1.018  1.005 - 1.030   pH 6.0  5.0 - 8.0   Glucose, UA NEGATIVE  NEGATIVE mg/dL   Hgb urine dipstick LARGE (*) NEGATIVE   Bilirubin Urine NEGATIVE  NEGATIVE   Ketones, ur NEGATIVE  NEGATIVE mg/dL   Protein, ur 100 (*) NEGATIVE mg/dL   Urobilinogen, UA 0.2  0.0 - 1.0 mg/dL   Nitrite NEGATIVE  NEGATIVE   Leukocytes, UA LARGE (*) NEGATIVE  URINE MICROSCOPIC-ADD ON     Status: Abnormal   Collection Time    12/12/13  1:19 AM      Result Value Ref Range   Squamous Epithelial / LPF RARE  RARE   WBC, UA FIELD OBSCURED BY WBC'S  <3 WBC/hpf   Comment: TNTC   RBC / HPF TOO NUMEROUS TO COUNT  <3 RBC/hpf   Bacteria, UA MANY (*) RARE   Urine-Other MUCOUS PRESENT     Comment: FEW YEAST  CBC     Status: Abnormal   Collection Time    12/12/13  4:13 AM      Result Value Ref Range   WBC 7.9  4.0 - 10.5 K/uL   RBC 3.31 (*) 4.22 - 5.81 MIL/uL   Hemoglobin 7.6 (*) 13.0 - 17.0 g/dL   Comment: DELTA CHECK NOTED     REPEATED TO VERIFY   HCT 24.3 (*) 39.0 - 52.0 %   MCV 73.4 (*) 78.0 - 100.0 fL   MCH 23.0 (*) 26.0 - 34.0 pg   MCHC 31.3  30.0 - 36.0 g/dL   RDW 18.4 (*) 11.5 - 15.5 %   Platelets 328  150 - 400 K/uL   Comment: DELTA CHECK NOTED     REPEATED TO VERIFY     SPECIMEN CHECKED FOR CLOTS  COMPREHENSIVE METABOLIC PANEL     Status: Abnormal   Collection Time    12/12/13  4:13 AM      Result Value Ref Range   Sodium 134 (*) 137 - 147 mEq/L   Potassium 3.6 (*) 3.7 - 5.3 mEq/L   Chloride 99  96 - 112 mEq/L   CO2 23  19 - 32 mEq/L   Glucose, Bld 166 (*) 70 - 99 mg/dL   BUN 11  6 - 23 mg/dL   Creatinine, Ser 0.90  0.50 - 1.35 mg/dL   Calcium 8.4  8.4 - 10.5 mg/dL   Total Protein 6.0  6.0 - 8.3 g/dL   Albumin 1.9 (*) 3.5 - 5.2 g/dL   AST 9  0 - 37 U/L   ALT <5  0 - 53 U/L   Alkaline Phosphatase 82  39 - 117 U/L   Total Bilirubin 0.3  0.3 - 1.2  mg/dL   GFR calc non Af Amer >90  >90 mL/min   GFR calc Af Amer >90  >90 mL/min   Comment: (NOTE)  The eGFR has been calculated using the CKD EPI equation.     This calculation has not been validated in all clinical situations.     eGFR's persistently <90 mL/min signify possible Chronic Kidney     Disease.   Anion gap 12  5 - 15    Imaging / Studies: Ct Abdomen Pelvis W Contrast  12/10/2013   CLINICAL DATA:  Colon cancer.  Evaluate for colovesical fistula.  EXAM: CT ABDOMEN AND PELVIS WITH CONTRAST  TECHNIQUE: Multidetector CT imaging of the abdomen and pelvis was performed using the standard protocol following bolus administration of intravenous contrast.  CONTRAST:  18m OMNIPAQUE IOHEXOL 300 MG/ML SOLN, 57mOMNIPAQUE IOHEXOL 300 MG/ML SOLN  COMPARISON:  CT 10/18/2013.  FINDINGS: No focal hepatic abnormality. The spleen is normal. Pancreas is normal. Biliary ductal system is nondistended. The gallbladder is distended. No pericholecystic fluid collections are noted. No gallstones identified.  The adrenals are normal. No focal renal parenchymal abnormality. Double-J right ureteral stent is noted. Stent is in good anatomic position. The bladder is decompressed. Air is noted throughout the bladder and right ureter/ renal collecting system. This could be from stent placement, infection, or enterovesical fistula formation. A large colorectal mass is again identified in this patient with known colorectal malignancy. The mass occupies almost the entire pelvis. The mass has increased in size and measures 14 x 11 cm. Punctate areas of air noted throughout the mass. A linear collection of air in is noted arising from the anterior wall of the rectal mass and abutting the bladder. Again colovesical fistula may be present.  No significant adenopathy. Abdominal aorta is widely patent as are its visceral branches. Portal and splenic vein patent. Hepatic veins are patent.  Colostomy site left lower quadrant. No  proximal bowel obstruction. Stool noted throughout the right colon. Stomach is nondistended. No free air. No pneumatosis. Large rectal mass has grade described above.  Heart size normal. Lung bases clear. No acute bony abnormality. Degenerative changes lumbar spine and both hips.  IMPRESSION: 1. Enlarging rectal mass. A rectal mass fills almost the entire pelvis measuring 14 x 11 cm. This is consistent with progressive local malignancy this patient with known colorectal cancer. 2. Air is noted in the bladder and renal collecting system. Although this could be related to stent placement infection and colovesical fistula could present in this fashion. A linear collection of air noted along the anterior wall of the colorectal mass abutting the bladder. This could represent a site of fistulization. 3. Double-J right ureteral stent in good anatomic position.   Electronically Signed   By: ThMarcello MooresRegister   On: 12/10/2013 21:09    Scheduled Meds: . ciprofloxacin  400 mg Intravenous BID  . ferrous sulfate  325 mg Oral BID WC  . heparin  5,000 Units Subcutaneous 3 times per day  . lactose free nutrition  237 mL Oral TID WC  . multivitamin with minerals  1 tablet Oral Daily  . ondansetron  4 mg Intravenous Once  . sodium chloride  1,000 mL Intravenous Once   Continuous Infusions: . sodium chloride 100 mL/hr at 12/11/13 2151   PRN Meds:.acetaminophen, acetaminophen, ALPRAZolam, morphine injection, ondansetron (ZOFRAN) IV, ondansetron, oxyCODONE-acetaminophen   Antibiotics: Anti-infectives   Start     Dose/Rate Route Frequency Ordered Stop   12/10/13 2300  ciprofloxacin (CIPRO) IVPB 400 mg     400 mg 200 mL/hr over 60 Minutes Intravenous 2 times daily 12/10/13 2255     12/10/13 2245  metroNIDAZOLE (FLAGYL) IVPB 500 mg  Status:  Discontinued     500 mg 100 mL/hr over 60 Minutes Intravenous Every 8 hours 12/10/13 2244 12/11/13 1324      Assessment/Plan  S/p diverting loop colostomy 10/15/13 Dr.  Ninfa Linden  Advanced rectal cancer-chemo/radiation by Dr. Lisbeth Renshaw and Dr. Benay Spice  Loose stools  Retracted stoma  Colovesical fistula   -He has 2 large "balls of stool" in ostomy along with loose stools.  Hopefully this will help alleviate some of his symptoms. Avoid anti-motility meds as he appears constipated on CT scans. -as mentioned yesterday we recommend starting chemo/radiation as soon as possible -we do NOT recommend surgical revision at this time. -he will need to be referred a tertiary care facility but this is not urgent -further management per primary team  Erby Pian, [facility] Surgery Pager [phone] Office [phone]  12/12/2013 9:33 AM

## 2013-12-12 NOTE — Progress Notes (Signed)
Received call from [facility] to give report to [place], Therapist, sports. Patient being placed in bed 7108 at [facility]. Awaiting transportation from [facility]. Pt is stable and updated on care. Will continue to monitor.

## 2013-12-12 NOTE — Progress Notes (Signed)
Please look at my progress note from today, patient being transferred to [facility]. surgery service Dr. Barry Dienes

## 2013-12-12 NOTE — Telephone Encounter (Signed)
Called 4th floor spoke with RON, asked for  patient's nurse,that's IFY,RN" tried to trasnfer call, but went back to Ron, he will have her call me at [phone], need to have patient down here in rad/onc for 215pm rad tx,waiting for call back from RN 10:02 AM

## 2013-12-12 NOTE — Telephone Encounter (Signed)
Called 4th floor and spoke with IFY,RN," radiation on hold per Dr. Benay Spice, per AMY/PA  Call , Dr. Benay Spice wants Dr.Moody to go see the patient first,and will inform Dr.Moody as he is in the [facility] office till 3pm,  10:31 AM

## 2013-12-12 NOTE — Telephone Encounter (Signed)
Grant [name]

## 2013-12-12 NOTE — Progress Notes (Signed)
Was informed by by Estill Bakes Riebock-NP that patient will be transferring to [facility], asked about ordered for transfer, she stated Dr. Allyson Sabal will be taking care of that, I called Dr. Allyson Sabal to let her know and she stated the doctor that wants him transfer will have to order for the transfer and complete the forms. Called the NP Emina to let her know and she stated she will take care of it.

## 2013-12-12 NOTE — Progress Notes (Signed)
Plan to transfer the patient to [facility].  Dr. Cloyd Stagers is the accepting physician.  Making disc copies of all CT scans, spoke with [name] is radiology.  Spoke with [facility] admissions [phone] and requested a bed.  Will plan to transfer him when bed is available.  I updated the patient and family at bedside.  Grant [name], ANP-BC

## 2013-12-12 NOTE — Progress Notes (Addendum)
[facility] PROGRESS NOTE  Grant [name] IOX:[id number] DOB: 10-Feb-1956 DOA: 12/10/2013 PCP: No PCP Per Patient  Assessment/Plan: Principal Problem:   Diarrhea Active Problems:   Colovesical fistula   Ureteral obstruction, right   Microcytic anemia   Rectal cancer   Dehydration   Hyponatremia  Please consider this to be a transfer summary to surgery Dr. Barry Dienes  S/p diverting loop colostomy 10/15/13 Dr. Ninfa Linden  Advanced rectal cancer-chemo/radiation by Dr. Lisbeth Renshaw and Dr. Benay Spice  Diarrhea rule out C. difficile  Retracted stoma  Colovesical fistula  Patient being followed by Gen. surgery/oncology  Awaiting to see Dr. Lisbeth Renshaw  Would appreciate if surgery would take over his care from the hospitalist service  Have already requested surgery to do so  I have nothing to offer him except IV hydration  I also discussed this with Dr. Benay Spice  General Surgery recommends to contemplate starting chemo/ radiation and once stable for discharge, have surgical evaluation at a tertiary facility due to concern of the retraction of lower loop of ostomy secondary to tumor growth, bladder invasion, with hydronephrosis and progression of tumor involvement Transferring the patient to Dr. Barry Dienes after discussing with her nurse practitioner Erby Pian, NP   Diarrhea  Noninfectious etiology  Would recommend to discontinue Flagyl, may continue ciprofloxacin for possible urinary tract infection   Rectal cancer  Holding radiation/chemotherapy until further decision from surgery   Hyponatremia.  Likely secondary to dehydration.  Continue IV hydration and monitor sodium levels.   Anemia  Microcytic anemia-likely iron deficiency anemia secondary to bleeding from the rectal tumor-now taking oral iron bid  Hemoglobin has dropped since admission probably secondary to hemodilution We'll transfuse 1 unit of packed red blood cells and repeat CBC this evening   Code Status: full  Family  Communication: family updated about patient's clinical progress  Disposition Plan: Final disposition per Dr. Barry Dienes   Brief narrative:  58 year male who underwent a lap assisted loop colostomy by Dr. Ninfa Linden on 10/15/13 due to rectal cancer a large obstructing mass at 9cm from the anal verge. He has advanced rectal cancer and has been followed by Dr. Benay Spice. He also has a known colovesical fistula. The patient reports being seen in our office due to a retracted stoma and constipation. He was advised to administer an enema and start miralax. He had great results with this, however, he then developed diarrhea from the ostomy and from his rectum. He had a follow up with Dr. Ninfa Linden yesterday, but due to severity of diarrhea he came to [facility]. He also reports stool in his urine. He has a negative c diff PCR. Stool cultures are negative. White count of 12.9K which has improved when compared to 1 week ago. He is on cipro/flagyl. He is hungry, wants to eat. He continues to have loose stools. He reports chills yesterday. Appetite has been adequate.   Consultants:  *General surgery  Oncology Procedures:  None  Antibiotics:  Cipro  Flagyl 7/29 discontinue   HPI/Subjective: Continues to have loose stools per rectum and per ostomy. Confused about whether he's starting chemo today   Objective: Filed Vitals:   12/11/13 0507 12/11/13 1340 12/11/13 2242 12/12/13 0534  BP: 122/72 131/68 123/74 134/90  Pulse: 90 82 93 94  Temp: 98.2 F (36.8 C) 98.3 F (36.8 C) 98.3 F (36.8 C) 98 F (36.7 C)  TempSrc: Oral Oral Oral Oral  Resp: 16 18 20 18   Height:      Weight: 63 kg (138 lb 14.2 oz)  64.411 kg (142 lb)  SpO2: 100% 100% 99% 100%    Intake/Output Summary (Last 24 hours) at 12/12/13 1226 Last data filed at 12/12/13 0900  Gross per 24 hour  Intake   4000 ml  Output    150 ml  Net   3850 ml    Exam:  Cardiovascular: Normal rate, regular rhythm, normal heart sounds and intact distal pulses.   Pulmonary/Chest: Effort normal and breath sounds normal. No respiratory distress.  General: Pt awake/alert/oriented x4 in no acute distress  Abdomen: Soft. Nondistended. Tender to pelvic region. Stoma is retracted. 2 large stool balls and loose dark stool in bag. No evidence of peritonitis. No incarcerated hernias.   Musculoskeletal: She exhibits no edema and no tenderness.  Neurological: She is alert. No cranial nerve deficit.    Data Reviewed: Basic Metabolic Panel:  Recent Labs Lab 12/10/13 1838 12/10/13 2244 12/11/13 0641 12/12/13 0413  NA 128* 128* 132* 134*  K 4.0 4.2 3.9 3.6*  CL 89* 90* 93* 99  CO2 23 24 24 23   GLUCOSE 166* 147* 117* 166*  BUN 18 16 14 11   CREATININE 1.01 0.95 0.91 0.90  CALCIUM 9.5 8.7 9.1 8.4    Liver Function Tests:  Recent Labs Lab 12/10/13 1838 12/10/13 2244 12/11/13 0641 12/12/13 0413  AST 14 15 10 9   ALT 7 5 6  <5  ALKPHOS 109 94 97 82  BILITOT 0.4 0.4 0.3 0.3  PROT 7.7 6.8 7.0 6.0  ALBUMIN 2.5* 2.2* 2.2* 1.9*    Recent Labs Lab 12/10/13 1838  LIPASE 11   No results found for this basename: AMMONIA,  in the last 168 hours  CBC:  Recent Labs Lab 12/10/13 1838 12/11/13 0641 12/12/13 0413  WBC 12.6* 12.9* 7.9  NEUTROABS 11.1* 11.2*  --   HGB 9.7* 9.3* 7.6*  HCT 31.0* 29.7* 24.3*  MCV 73.3* 74.6* 73.4*  PLT 399 471* 328    Cardiac Enzymes: No results found for this basename: CKTOTAL, CKMB, CKMBINDEX, TROPONINI,  in the last 168 hours BNP (last 3 results) No results found for this basename: PROBNP,  in the last 8760 hours   CBG: No results found for this basename: GLUCAP,  in the last 168 hours  Recent Results (from the past 240 hour(s))  CLOSTRIDIUM DIFFICILE BY PCR     Status: None   Collection Time    12/11/13  8:40 AM      Result Value Ref Range Status   C difficile by pcr NEGATIVE  NEGATIVE Final   Comment: Performed at [facility]     Studies: Ct Abdomen Pelvis W Contrast  12/10/2013    CLINICAL DATA:  Colon cancer.  Evaluate for colovesical fistula.  EXAM: CT ABDOMEN AND PELVIS WITH CONTRAST  TECHNIQUE: Multidetector CT imaging of the abdomen and pelvis was performed using the standard protocol following bolus administration of intravenous contrast.  CONTRAST:  158mL OMNIPAQUE IOHEXOL 300 MG/ML SOLN, 44mL OMNIPAQUE IOHEXOL 300 MG/ML SOLN  COMPARISON:  CT 10/18/2013.  FINDINGS: No focal hepatic abnormality. The spleen is normal. Pancreas is normal. Biliary ductal system is nondistended. The gallbladder is distended. No pericholecystic fluid collections are noted. No gallstones identified.  The adrenals are normal. No focal renal parenchymal abnormality. Double-J right ureteral stent is noted. Stent is in good anatomic position. The bladder is decompressed. Air is noted throughout the bladder and right ureter/ renal collecting system. This could be from stent placement, infection, or enterovesical fistula formation. A large colorectal mass is  again identified in this patient with known colorectal malignancy. The mass occupies almost the entire pelvis. The mass has increased in size and measures 14 x 11 cm. Punctate areas of air noted throughout the mass. A linear collection of air in is noted arising from the anterior wall of the rectal mass and abutting the bladder. Again colovesical fistula may be present.  No significant adenopathy. Abdominal aorta is widely patent as are its visceral branches. Portal and splenic vein patent. Hepatic veins are patent.  Colostomy site left lower quadrant. No proximal bowel obstruction. Stool noted throughout the right colon. Stomach is nondistended. No free air. No pneumatosis. Large rectal mass has grade described above.  Heart size normal. Lung bases clear. No acute bony abnormality. Degenerative changes lumbar spine and both hips.  IMPRESSION: 1. Enlarging rectal mass. A rectal mass fills almost the entire pelvis measuring 14 x 11 cm. This is consistent with  progressive local malignancy this patient with known colorectal cancer. 2. Air is noted in the bladder and renal collecting system. Although this could be related to stent placement infection and colovesical fistula could present in this fashion. A linear collection of air noted along the anterior wall of the colorectal mass abutting the bladder. This could represent a site of fistulization. 3. Double-J right ureteral stent in good anatomic position.   Electronically Signed   By: Marcello Moores  Register   On: 12/10/2013 21:09    Scheduled Meds: . ciprofloxacin  400 mg Intravenous BID  . ferrous sulfate  325 mg Oral BID WC  . heparin  5,000 Units Subcutaneous 3 times per day  . lactose free nutrition  237 mL Oral TID WC  . multivitamin with minerals  1 tablet Oral Daily  . ondansetron  4 mg Intravenous Once  . sodium chloride  1,000 mL Intravenous Once   Continuous Infusions: . sodium chloride 100 mL/hr at 12/12/13 1213    Principal Problem:   Diarrhea Active Problems:   Colovesical fistula   Ureteral obstruction, right   Microcytic anemia   Rectal cancer   Dehydration   Hyponatremia    Time spent: 40 minutes   [facility] Pager [phone]. If 8PM-8AM, please contact night-coverage at [email or web address], password [facility] 12/12/2013, 12:26 PM  LOS: 2 days

## 2013-12-12 NOTE — Progress Notes (Signed)
Parley Pidcock   DOB:03/18/1956   BL#:[id number]   RAQ#:[id number]  Subjective:  Grant [name] is a 58 year male with a history of rectal cancer diagnosed in 09/2013, adenocarcinoma, as described below s/p lap assisted loop colostomy by Dr. Ninfa Linden on 10/15/13. He was to initiate chemo with Xeloda and radiation to start on 7/28  Patient has a known colovesical fistula. He was seen at the General Surgery office due to a retracted stoma and constipation. He was advised to administer an enema and start Miralax with good results. Status was complicated with diarrhea from the ostomy and from his rectum. He continues to lose weight, despite appetite. He denies any nausea or vomiting. He continues to have significant lower abdominal pain. He denied any respiratory or cardiac complaints. No mental status changes.   He had a follow up with Dr. Ninfa Linden on 7/28  but due to severity of diarrhea complicated with stool in his urine, he presented to the [facility]. C diff PCR was negative. Stool cultures were negative. He was on cipro/flagyl on admission.   CT of the abdomen and pelvis with contrast on 7/28 showed the enlarging rectal mass filling almost the entire pelvis measuring 14 x 11 cm, consistent with progressive local malignancy. Air was noted in the bladder and renal collecting system from stent placement, infection, versus colovesical fistula. A linear collection of air noted along the anterior wall of the colorectal mass abutting the bladder could represent a site of fistulization. His Double-J right ureteral stent was well localized. Dr. Barry Dienes consulted the patient in hospital, concluding that there might have been impacted stool instead of frank diarrhea. The concern was the retraction of lower loop of ostomy secondary to tumor growth, and bladder invasion, with hydronephrosis and progression of tumor involvement, recommending referral to a tertiary facility. We have been kindly informed of the patient's  admission.    Oncological History:  1. Rectal cancer-large pelvic mass with a colonoscopy 10/11/2013 confirming an obstructing tumor at 9 cm from the anal verge        CTs of the chest, abdomen, and revealed a large complex pelvic mass, gas in the bladder, loculated gas posterior to the bladder, right hydronephrosis, and small bilateral indeterminate lung nodules             CT of the abdomen and pelvis with contrast on 7/28 showed the enlarging rectal mass filling almost the entire pelvis measuring 14 x 11 cm, consistent with progressive local malignancy. Air was noted in the bladder and renal                   collecting system suspicious for stent placement infection versus colovesical fistula. A linear collection of air noted along the anterior wall of the colorectal mass abutting the bladder could represent a site of fistulization. His                        Double-J right ureteral stent was well localized.   Status post a descending loop colostomy 10/15/2013 2. Right hydronephrosis secondary to the obstructing pelvic mass, status post placement of a right percutaneous nephrostomy tube followed by a right ureter stent. 3. Microcytic anemia-likely iron deficiency anemia secondary to bleeding from the rectal tumor-now taking iron.  4. Colovesical fistula-on the CT 10/09/2013 evaluated by Dr. Alinda Money, followup CT cystogram 10/18/2013 revealed no fistula.  5. Anorexia/weight loss secondary to #1. 6. Indeterminate lung nodules on a CT of the chest 10/09/2013.  7. Family history of pancreas and colon cancer. 8. Retracted stoma.  Scheduled Meds: . ciprofloxacin  400 mg Intravenous BID  . ferrous sulfate  325 mg Oral BID WC  . heparin  5,000 Units Subcutaneous 3 times per day  . lactose free nutrition  237 mL Oral TID WC  . multivitamin with minerals  1 tablet Oral Daily  . ondansetron  4 mg Intravenous Once  . sodium chloride  1,000 mL Intravenous Once   Continuous Infusions: . sodium  chloride 100 mL/hr at 12/11/13 2151   PRN Meds:.acetaminophen, acetaminophen, ALPRAZolam, morphine injection, ondansetron (ZOFRAN) IV, ondansetron, oxyCODONE-acetaminophen   Objective:  Filed Vitals:   12/12/13 0534  BP: 134/90  Pulse: 94  Temp: 98 F (36.7 C)  Resp: 18      Intake/Output Summary (Last 24 hours) at 12/12/13 0724 Last data filed at 12/12/13 0600  Gross per 24 hour  Intake   3880 ml  Output    150 ml  Net   3730 ml    ECOG PERFORMANCE STATUS: 1 Symptomatic but completely ambulatory (Restricted in physically strenuous activity but ambulatory and able to carry out work of a light or sedentary nature. For example, light    GENERAL:alert, no distress and uncomfortable due to pain. Chronically ill appearing. Flat affect. SKIN: skin color pale, dry texture no rashes or significant lesions HEENT:  conjunctiva are pink and non-injected, sclera clear. Temporal wasting noted OROPHARYNX:no exudate, no erythema and lips, buccal mucosa dry, and tongue normal  NECK: supple, thyroid normal size, non-tender, without nodularity LYMPH:  no palpable lymphadenopathy in the cervical, axillary or inguinal LUNGS: clear to auscultation and percussion with normal breathing effort HEART: regular rate & rhythm and no murmurs and no lower extremity edema ABDOMEN:abdomen soft, exquisite tenderness on both lower quadrants with guarding noted. Left colostomy site with dark, semi-formed stools; Stoma retracted. bowel sounds present throughout. Musculoskeletal:no cyanosis of digits and no clubbing  PSYCH: alert & oriented x 3 with fluent speech NEURO: no focal motor/sensory deficits    CBG (last 3)  No results found for this basename: GLUCAP,  in the last 72 hours   Labs:   Recent Labs Lab 12/10/13 1838 12/11/13 0641 12/12/13 0413  WBC 12.6* 12.9* 7.9  HGB 9.7* 9.3* 7.6*  HCT 31.0* 29.7* 24.3*  PLT 399 471* 328  MCV 73.3* 74.6* 73.4*  MCH 22.9* 23.4* 23.0*  MCHC 31.3 31.3 31.3   RDW 18.3* 18.4* 18.4*  LYMPHSABS 0.9 1.0  --   MONOABS 0.6 0.6  --   EOSABS 0.0 0.0  --   BASOSABS 0.0 0.0  --      Chemistries:    Recent Labs Lab 12/10/13 1838 12/10/13 2244 12/11/13 0641 12/12/13 0413  NA 128* 128* 132* 134*  K 4.0 4.2 3.9 3.6*  CL 89* 90* 93* 99  CO2 23 24 24 23   GLUCOSE 166* 147* 117* 166*  BUN 18 16 14 11   CREATININE 1.01 0.95 0.91 0.90  CALCIUM 9.5 8.7 9.1 8.4  AST 14 15 10 9   ALT 7 5 6  <5  ALKPHOS 109 94 97 82  BILITOT 0.4 0.4 0.3 0.3    GFR Estimated Creatinine Clearance: 83.5 ml/min (by C-G formula based on Cr of 0.9).  Liver Function Tests:  Recent Labs Lab 12/10/13 1838 12/10/13 2244 12/11/13 0641 12/12/13 0413  AST 14 15 10 9   ALT 7 5 6  <5  ALKPHOS 109 94 97 82  BILITOT 0.4 0.4 0.3 0.3  PROT  7.7 6.8 7.0 6.0  ALBUMIN 2.5* 2.2* 2.2* 1.9*    Recent Labs Lab 12/10/13 1838  LIPASE 11    Urine Studies     Component Value Date/Time   COLORURINE YELLOW 12/12/2013 0119   APPEARANCEUR TURBID* 12/12/2013 0119   LABSPEC 1.018 12/12/2013 0119   PHURINE 6.0 12/12/2013 0119   GLUCOSEU NEGATIVE 12/12/2013 0119   HGBUR LARGE* 12/12/2013 0119   BILIRUBINUR NEGATIVE 12/12/2013 0119   KETONESUR NEGATIVE 12/12/2013 0119   PROTEINUR 100* 12/12/2013 0119   UROBILINOGEN 0.2 12/12/2013 0119   NITRITE NEGATIVE 12/12/2013 0119   LEUKOCYTESUR LARGE* 12/12/2013 0119    Recent Results (from the past 240 hour(s))  CLOSTRIDIUM DIFFICILE BY PCR     Status: None   Collection Time    12/11/13  8:40 AM      Result Value Ref Range Status   C difficile by pcr NEGATIVE  NEGATIVE Final   Comment: Performed at [facility]       Imaging Studies:  Ct Abdomen Pelvis W Contrast  12/10/2013   CLINICAL DATA:  Colon cancer.  Evaluate for colovesical fistula.  EXAM: CT ABDOMEN AND PELVIS WITH CONTRAST  TECHNIQUE: Multidetector CT imaging of the abdomen and pelvis was performed using the standard protocol following bolus administration of intravenous  contrast.  CONTRAST:  182mL OMNIPAQUE IOHEXOL 300 MG/ML SOLN, 69mL OMNIPAQUE IOHEXOL 300 MG/ML SOLN  COMPARISON:  CT 10/18/2013.  FINDINGS: No focal hepatic abnormality. The spleen is normal. Pancreas is normal. Biliary ductal system is nondistended. The gallbladder is distended. No pericholecystic fluid collections are noted. No gallstones identified.  The adrenals are normal. No focal renal parenchymal abnormality. Double-J right ureteral stent is noted. Stent is in good anatomic position. The bladder is decompressed. Air is noted throughout the bladder and right ureter/ renal collecting system. This could be from stent placement, infection, or enterovesical fistula formation. A large colorectal mass is again identified in this patient with known colorectal malignancy. The mass occupies almost the entire pelvis. The mass has increased in size and measures 14 x 11 cm. Punctate areas of air noted throughout the mass. A linear collection of air in is noted arising from the anterior wall of the rectal mass and abutting the bladder. Again colovesical fistula may be present.  No significant adenopathy. Abdominal aorta is widely patent as are its visceral branches. Portal and splenic vein patent. Hepatic veins are patent.  Colostomy site left lower quadrant. No proximal bowel obstruction. Stool noted throughout the right colon. Stomach is nondistended. No free air. No pneumatosis. Large rectal mass has grade described above.  Heart size normal. Lung bases clear. No acute bony abnormality. Degenerative changes lumbar spine and both hips.  IMPRESSION: 1. Enlarging rectal mass. A rectal mass fills almost the entire pelvis measuring 14 x 11 cm. This is consistent with progressive local malignancy this patient with known colorectal cancer. 2. Air is noted in the bladder and renal collecting system. Although this could be related to stent placement infection and colovesical fistula could present in this fashion. A linear  collection of air noted along the anterior wall of the colorectal mass abutting the bladder. This could represent a site of fistulization. 3. Double-J right ureteral stent in good anatomic position.   Electronically Signed   By: Marcello Moores  Register   On: 12/10/2013 21:09    Assessment/Plan: 58 y.o.  1. Rectal cancer, Adenocarcinoma  Status post a diverting loop colostomy 10/15/2013   2. Retracted stoma  3. Colovesical  fistula    4. Diarrhea-likely secondary to laxative Culture was negative for C diff. Flagyl was discontinued on 7/29, prophylactic  Cipro to continue, as patient reports stool in urine.  5. Hyponatremia.  Likely secondary to dehydration.   IV hydration and monitoring  sodium levels as per IM.   6. Anemia  Microcytic anemia-likely iron deficiency anemia secondary to bleeding from the rectal tumor-now taking oral iron bid    7. Malnutrition   8. Lower abdominal pain Secondary to the rectal/pelvic tumor   9. DVT prophylaxis On Heparin  10. Full Code Other medical issues as per admitting team     **Disclaimer: This note was dictated with voice recognition software. Similar sounding words can inadvertently be transcribed and this note may contain transcription errors which may not have been corrected upon publication of note.** Rondel Jumbo, PA-C 12/12/2013  7:24 AM Mr. Shaul was interviewed and examined. He is symptomatic with pain related to the large rectal tumor. He was admitted with diarrhea, likely secondary to laxatives he took earlier this week. He appears to have a colovesical fistula. He is malnourished.  I discussed the case with Dr. Barry Dienes and Dr. Lisbeth Renshaw. We can proceed with chemotherapy and radiation in an attempt to downstage the tumor. However he remains deconditioned, has significant pain, a fistula, and there are issues with the ostomy. He may be better served by initial surgery followed by adjuvant therapy.  Dr. Barry Dienes plans to contact the  surgical service at [facility] to discuss the case. We will initiate Xeloda and radiation if surgery is not recommended.  I will be out until 12/16/2013. Please contact oncology as needed.

## 2013-12-13 ENCOUNTER — Ambulatory Visit: Payer: Medicaid Other

## 2013-12-13 ENCOUNTER — Ambulatory Visit: Admission: RE | Admit: 2013-12-13 | Payer: Medicaid Other | Source: Ambulatory Visit | Admitting: Radiation Oncology

## 2013-12-13 LAB — TYPE AND SCREEN
ABO/RH(D): A POS
ANTIBODY SCREEN: NEGATIVE
Unit division: 0

## 2013-12-13 LAB — URINE CULTURE

## 2013-12-13 NOTE — Discharge Summary (Signed)
Seen, agree with above. To [facility] for further surgical care.

## 2013-12-13 NOTE — Discharge Summary (Addendum)
Physician Discharge Summary  Byron Peacock XBM:[id number] DOB: Jun 04, 1955 DOA: 12/10/2013  PCP: No PCP Per Patient  Consultation:  Admit date: 12/10/2013 Discharge date: 12/12/2013  Recommendations for Outpatient Follow-up:  Pt transferred to [facility] under care of Dr. Cloyd Stagers   Discharge Diagnoses:  1. Dehydration 2. Hyponatremia 3. constipation 4. Colovesical obstruction 5. Right ureteral obstruction 6. Advanced rectal cancer 7.  Severe protein calorie malnutrition.  Surgical Procedure: none  Discharge Condition: stable Disposition: [facility]    Filed Weights   12/10/13 2304 12/11/13 0507 12/12/13 0534  Weight: 140 lb (63.504 kg) 138 lb 14.2 oz (63 kg) 142 lb (64.411 kg)     Filed Vitals:   12/12/13 2221  BP: 144/72  Pulse: 92  Temp: 99.1 F (37.3 C)  Resp: 16     Hospital Course:  Won Kreuzer is a 58 year old male with a history of rectal cancer who underwent a laparoscopic assisted loop colostomy by Dr. Ninfa Linden on 10/15/13.  He has advanced rectal cancer and has been followed by Dr. Benay Spice.  He was due to start his radiation.  He presented to [facility] ED with complaints of diarrhea.  He was admitted for further evaluation.  He reported constipation prior to onset of symptoms which was alleviated by an enema and miralax, however, then developed diarrhea per rectum and his ostomy.  He was found to have a retracted stoma, but appeared to be functioning.  C diff toxin was negative.  WBC on admission was 12.6 and then normalized to 7.9.  He was treated with cipro and flagyl empirically.  Mr. Ingwersen evaluation also revealed hyponatremia with Na of 128, anemia and dehydration.  We were then consulted to evaluate the patient for the retracted stoma and diarrhea.  Upon evaluation, he was found to have loose stools and was quite constipated on imaging.  The stoma appeared to be retracted which was attributed to the tumor burden and the increase in size.  We recommended start  radiation and eventual transfer to a tertiary facility, however, after consultation with our colorectal specialist, Dr. Leighton Ruff, we recommended more urgent transfer.   He was given 1 unit of PRBCs for h&h of 7.6/24.3 per internal medicine.  His dehydration and hyponatremia resolved, we therefore took over Mr. Chagoya care.  The patient remained hemodynamically stable.  He was felt stable for transfer under the care of Dr. Cloyd Stagers at [facility].  I had a discussion with the patient and significant other regarding further care and they felt that he would best be suited at a higher level of care as we recommended.    Discharge Instructions     Medication List         ALPRAZolam 0.5 MG tablet  Commonly known as:  XANAX  Take 1 tablet (0.5 mg total) by mouth 2 (two) times daily as needed for anxiety.     capecitabine 500 MG tablet  Commonly known as:  XELODA  Take 3 tablets (1,500 mg total) by mouth 2 (two) times daily after a meal. On days of radiation only (Mon-Fri)     ferrous sulfate 325 (65 FE) MG tablet  Take 1 tablet (325 mg total) by mouth 2 (two) times daily with a meal.     lactose free nutrition Liqd  Take 237 mLs by mouth 3 (three) times daily with meals.     multivitamin with minerals Tabs tablet  Take 1 tablet by mouth daily.     oxyCODONE-acetaminophen 5-325 MG per tablet  Commonly known as:  PERCOCET/ROXICET  Take 2 tablets by mouth every 6 (six) hours as needed for severe pain.          The results of significant diagnostics from this hospitalization (including imaging, microbiology, ancillary and laboratory) are listed below for reference.    Significant Diagnostic Studies: Ct Abdomen Pelvis W Contrast  12/10/2013   CLINICAL DATA:  Colon cancer.  Evaluate for colovesical fistula.  EXAM: CT ABDOMEN AND PELVIS WITH CONTRAST  TECHNIQUE: Multidetector CT imaging of the abdomen and pelvis was performed using the standard protocol following bolus administration of  intravenous contrast.  CONTRAST:  149mL OMNIPAQUE IOHEXOL 300 MG/ML SOLN, 11mL OMNIPAQUE IOHEXOL 300 MG/ML SOLN  COMPARISON:  CT 10/18/2013.  FINDINGS: No focal hepatic abnormality. The spleen is normal. Pancreas is normal. Biliary ductal system is nondistended. The gallbladder is distended. No pericholecystic fluid collections are noted. No gallstones identified.  The adrenals are normal. No focal renal parenchymal abnormality. Double-J right ureteral stent is noted. Stent is in good anatomic position. The bladder is decompressed. Air is noted throughout the bladder and right ureter/ renal collecting system. This could be from stent placement, infection, or enterovesical fistula formation. A large colorectal mass is again identified in this patient with known colorectal malignancy. The mass occupies almost the entire pelvis. The mass has increased in size and measures 14 x 11 cm. Punctate areas of air noted throughout the mass. A linear collection of air in is noted arising from the anterior wall of the rectal mass and abutting the bladder. Again colovesical fistula may be present.  No significant adenopathy. Abdominal aorta is widely patent as are its visceral branches. Portal and splenic vein patent. Hepatic veins are patent.  Colostomy site left lower quadrant. No proximal bowel obstruction. Stool noted throughout the right colon. Stomach is nondistended. No free air. No pneumatosis. Large rectal mass has grade described above.  Heart size normal. Lung bases clear. No acute bony abnormality. Degenerative changes lumbar spine and both hips.  IMPRESSION: 1. Enlarging rectal mass. A rectal mass fills almost the entire pelvis measuring 14 x 11 cm. This is consistent with progressive local malignancy this patient with known colorectal cancer. 2. Air is noted in the bladder and renal collecting system. Although this could be related to stent placement infection and colovesical fistula could present in this fashion. A  linear collection of air noted along the anterior wall of the colorectal mass abutting the bladder. This could represent a site of fistulization. 3. Double-J right ureteral stent in good anatomic position.   Electronically Signed   By: Marcello Moores  Register   On: 12/10/2013 21:09    Microbiology: Recent Results (from the past 240 hour(s))  CLOSTRIDIUM DIFFICILE BY PCR     Status: None   Collection Time    12/11/13  8:40 AM      Result Value Ref Range Status   C difficile by pcr NEGATIVE  NEGATIVE Final   Comment: Performed at [facility]  URINE CULTURE     Status: None   Collection Time    12/12/13  1:19 AM      Result Value Ref Range Status   Specimen Description URINE, CLEAN CATCH   Final   Special Requests NONE   Final   Culture  Setup Time     Final   Value: 12/12/2013 08:37     Performed at [facility]     Final   Value: 20,OOO  COLONIES/ML     Performed at [facility]     Final   Value: Multiple bacterial morphotypes present, none predominant. Suggest appropriate recollection if clinically indicated.     Performed at [facility]   Report Status 12/13/2013 FINAL   Final     Labs: Basic Metabolic Panel:  Recent Labs Lab 12/10/13 1838 12/10/13 2244 12/11/13 0641 12/12/13 0413  NA 128* 128* 132* 134*  K 4.0 4.2 3.9 3.6*  CL 89* 90* 93* 99  CO2 23 24 24 23   GLUCOSE 166* 147* 117* 166*  BUN 18 16 14 11   CREATININE 1.01 0.95 0.91 0.90  CALCIUM 9.5 8.7 9.1 8.4   Liver Function Tests:  Recent Labs Lab 12/10/13 1838 12/10/13 2244 12/11/13 0641 12/12/13 0413  AST 14 15 10 9   ALT 7 5 6  <5  ALKPHOS 109 94 97 82  BILITOT 0.4 0.4 0.3 0.3  PROT 7.7 6.8 7.0 6.0  ALBUMIN 2.5* 2.2* 2.2* 1.9*    Recent Labs Lab 12/10/13 1838  LIPASE 11   No results found for this basename: AMMONIA,  in the last 168 hours CBC:  Recent Labs Lab 12/10/13 1838 12/11/13 0641 12/12/13 0413  WBC 12.6* 12.9* 7.9  NEUTROABS  11.1* 11.2*  --   HGB 9.7* 9.3* 7.6*  HCT 31.0* 29.7* 24.3*  MCV 73.3* 74.6* 73.4*  PLT 399 471* 328   Cardiac Enzymes: No results found for this basename: CKTOTAL, CKMB, CKMBINDEX, TROPONINI,  in the last 168 hours BNP: BNP (last 3 results) No results found for this basename: PROBNP,  in the last 8760 hours CBG: No results found for this basename: GLUCAP,  in the last 168 hours  Principal Problem:   Diarrhea Active Problems:   Colovesical fistula   Ureteral obstruction, right   Microcytic anemia   Rectal cancer   Dehydration   Hyponatremia   Time coordinating discharge: <30 mins  Signed:  Emina Riebock, ANP-BC

## 2013-12-15 DIAGNOSIS — C20 Malignant neoplasm of rectum: Secondary | ICD-10-CM | POA: Insufficient documentation

## 2013-12-16 ENCOUNTER — Ambulatory Visit: Payer: Medicaid Other

## 2013-12-17 ENCOUNTER — Telehealth: Payer: Self-pay | Admitting: *Deleted

## 2013-12-17 ENCOUNTER — Telehealth: Payer: Self-pay | Admitting: Oncology

## 2013-12-17 ENCOUNTER — Other Ambulatory Visit: Payer: Self-pay | Admitting: *Deleted

## 2013-12-17 ENCOUNTER — Ambulatory Visit: Payer: Medicaid Other

## 2013-12-17 DIAGNOSIS — C20 Malignant neoplasm of rectum: Secondary | ICD-10-CM

## 2013-12-17 NOTE — Telephone Encounter (Signed)
Attempted to reach patient several times without success.   Left voice message to please return call re: scheduled appointments at [facility] and to assess for any needs.  Contact information and phone number was provided.

## 2013-12-17 NOTE — Telephone Encounter (Signed)
lmonvm for pt re appt for 8/12. schedule mailed.

## 2013-12-17 NOTE — Telephone Encounter (Signed)
Call from [facility] asking to clarify appt for tomorrow. Informed her pt is to begin radiation and Xeloda tomorrow. She stated they will pick up Xeloda this afternoon, being discharged from [facility] today. Xeloda instructions reviewed, encouraged them to call office with questions.

## 2013-12-18 ENCOUNTER — Encounter: Payer: Self-pay | Admitting: *Deleted

## 2013-12-18 ENCOUNTER — Ambulatory Visit: Payer: Medicaid Other

## 2013-12-18 ENCOUNTER — Ambulatory Visit
Admission: RE | Admit: 2013-12-18 | Discharge: 2013-12-18 | Disposition: A | Discharge status: Home / Self Care | Payer: Medicaid Other | Source: Ambulatory Visit | Attending: Radiation Oncology | Admitting: Radiation Oncology

## 2013-12-18 DIAGNOSIS — Z933 Colostomy status: Secondary | ICD-10-CM | POA: Diagnosis not present

## 2013-12-18 DIAGNOSIS — Z87891 Personal history of nicotine dependence: Secondary | ICD-10-CM | POA: Diagnosis not present

## 2013-12-18 DIAGNOSIS — F1210 Cannabis abuse, uncomplicated: Secondary | ICD-10-CM | POA: Diagnosis not present

## 2013-12-18 DIAGNOSIS — R918 Other nonspecific abnormal finding of lung field: Secondary | ICD-10-CM | POA: Diagnosis not present

## 2013-12-18 DIAGNOSIS — C20 Malignant neoplasm of rectum: Secondary | ICD-10-CM

## 2013-12-18 DIAGNOSIS — N1330 Unspecified hydronephrosis: Secondary | ICD-10-CM | POA: Diagnosis not present

## 2013-12-18 DIAGNOSIS — C218 Malignant neoplasm of overlapping sites of rectum, anus and anal canal: Secondary | ICD-10-CM | POA: Diagnosis not present

## 2013-12-18 DIAGNOSIS — Z510 Encounter for antineoplastic radiation therapy: Secondary | ICD-10-CM | POA: Diagnosis not present

## 2013-12-18 HISTORY — DX: Malignant neoplasm of rectum: C20

## 2013-12-18 HISTORY — DX: Chronic kidney disease, unspecified: N18.9

## 2013-12-18 HISTORY — DX: Malignant (primary) neoplasm, unspecified: C80.1

## 2013-12-18 NOTE — Progress Notes (Signed)
  Radiation Oncology         [id number]) [phone] ________________________________  Name: Grant [name] MRN: [id number]  Date: 12/18/2013  DOB: 19-Mar-1956  Simulation Verification Note   NARRATIVE: The patient was brought to the treatment unit and placed in the planned treatment position. The clinical setup was verified. Then port films were obtained and uploaded to the radiation oncology medical record software.  The treatment beams were carefully compared against the planned radiation fields. The position, location, and shape of the radiation fields was reviewed. The targeted volume of tissue appears to be appropriately covered by the radiation beams. Based on my personal review, I approved the simulation verification. The patient's treatment will proceed as planned.  ________________________________   Jodelle Gross, MD, PhD

## 2013-12-19 ENCOUNTER — Ambulatory Visit
Admission: RE | Admit: 2013-12-19 | Discharge: 2013-12-19 | Disposition: A | Discharge status: Home / Self Care | Payer: Medicaid Other | Source: Ambulatory Visit | Attending: Radiation Oncology | Admitting: Radiation Oncology

## 2013-12-19 ENCOUNTER — Telehealth: Payer: Self-pay | Admitting: *Deleted

## 2013-12-19 ENCOUNTER — Ambulatory Visit: Payer: Self-pay | Admitting: Family Medicine

## 2013-12-19 ENCOUNTER — Ambulatory Visit: Payer: Medicaid Other

## 2013-12-19 DIAGNOSIS — Z510 Encounter for antineoplastic radiation therapy: Secondary | ICD-10-CM | POA: Diagnosis not present

## 2013-12-19 NOTE — Telephone Encounter (Signed)
Attempted to reach patient by phone without success.  Will continue to try to contact patient.

## 2013-12-20 ENCOUNTER — Ambulatory Visit
Admission: RE | Admit: 2013-12-20 | Discharge: 2013-12-20 | Disposition: A | Discharge status: Home / Self Care | Payer: Medicaid Other | Source: Ambulatory Visit | Attending: Radiation Oncology | Admitting: Radiation Oncology

## 2013-12-20 ENCOUNTER — Encounter: Payer: Self-pay | Admitting: Radiation Oncology

## 2013-12-20 ENCOUNTER — Ambulatory Visit: Admission: RE | Admit: 2013-12-20 | Payer: Medicaid Other | Source: Ambulatory Visit | Admitting: Radiation Oncology

## 2013-12-20 ENCOUNTER — Ambulatory Visit: Payer: Medicaid Other

## 2013-12-20 VITALS — BP 115/71 | Temp 98.6°F | Resp 16 | Wt 155.8 lb

## 2013-12-20 DIAGNOSIS — Z510 Encounter for antineoplastic radiation therapy: Secondary | ICD-10-CM | POA: Diagnosis not present

## 2013-12-20 DIAGNOSIS — C20 Malignant neoplasm of rectum: Secondary | ICD-10-CM

## 2013-12-20 NOTE — Progress Notes (Signed)
   Department of Radiation Oncology  Phone:  [phone] Fax:        [phone]  Weekly Treatment Note    Name: Grant [name] Date: 12/20/2013 MRN: [id number] DOB: 1956/03/19   Current dose: 5.4 Gy  Current fraction: 3   MEDICATIONS: Current Outpatient Prescriptions  Medication Sig Dispense Refill  . ALPRAZolam (XANAX) 0.5 MG tablet Take 1 tablet (0.5 mg total) by mouth 2 (two) times daily as needed for anxiety.  60 tablet  1  . capecitabine (XELODA) 500 MG tablet Take 3 tablets (1,500 mg total) by mouth 2 (two) times daily after a meal. On days of radiation only (Mon-Fri)  168 tablet  0  . docusate (COLACE) 50 MG/5ML liquid Take 100 mg by mouth.      . ferrous sulfate 325 (65 FE) MG tablet Take 1 tablet (325 mg total) by mouth 2 (two) times daily with a meal.  60 tablet  0  . ferrous sulfate 325 (65 FE) MG tablet Take 325 mg by mouth.      . lactose free nutrition (BOOST PLUS) LIQD Take 237 mLs by mouth 3 (three) times daily with meals.  60 Can  0  . levofloxacin (LEVAQUIN) 500 MG tablet Take 500 mg by mouth.      . metroNIDAZOLE (FLAGYL) 500 MG tablet Take 500 mg by mouth.      . Multiple Vitamin (MULTIVITAMIN WITH MINERALS) TABS tablet Take 1 tablet by mouth daily.  30 tablet  0  . Multiple Vitamins-Minerals ([employer] SPECTRAVITE ULTRA MENS) TABS Take by mouth.      . oxyCODONE-acetaminophen (PERCOCET/ROXICET) 5-325 MG per tablet Take 2 tablets by mouth every 6 (six) hours as needed for severe pain.  75 tablet  0  . polyethylene glycol (MIRALAX / GLYCOLAX) packet Take by mouth.      . senna (SENOKOT) 8.6 MG TABS tablet Take by mouth.       No current facility-administered medications for this encounter.     ALLERGIES: Strawberry; Sunflower oil; and Watermelon   LABORATORY DATA:  Lab Results  Component Value Date   WBC 7.9 12/12/2013   HGB 7.6* 12/12/2013   HCT 24.3* 12/12/2013   MCV 73.4* 12/12/2013   PLT 328 12/12/2013   Lab Results  Component Value Date   NA 134*  12/12/2013   K 3.6* 12/12/2013   CL 99 12/12/2013   CO2 23 12/12/2013   Lab Results  Component Value Date   ALT <5 12/12/2013   AST 9 12/12/2013   ALKPHOS 82 12/12/2013   BILITOT 0.3 12/12/2013     NARRATIVE: Grant [name] was seen today for weekly treatment management. The chart was checked and the patient's films were reviewed. The patient is doing well after having begun his radiation treatment earlier this week on Wednesday. He is quite fatigued but has no specific complaints today with regards to his treatment.  PHYSICAL EXAMINATION: weight is 155 lb 12.8 oz (70.67 kg). His oral temperature is 98.6 F (37 C). His blood pressure is 115/71. His respiration is 16.        ASSESSMENT: The patient is doing satisfactorily with treatment.  PLAN: We will continue with the patient's radiation treatment as planned.

## 2013-12-20 NOTE — Progress Notes (Addendum)
Weekly rad txs pelvis, 3/25 completed, patient education done, sitz bath and radiation therapy and you book given with instructions of [place] of sitz bath, , skin irritation,fatigue,pain, nausea,vomiting,diarrhea, urinary changes,dysuria,urgency/frequency,spasms, rectal discomfort, but imodium and have on hand  And use -as needed, taking Xeloda bid , increase protein in diet, may need to go to low fiber diet for diarrhea episodes, eat smaller meals 5-6x day,softer foods for nausea, brat diet, drink plenty water,stay hydrated, seed MD weekly and prn discomfort a 7/10 abd/back, , will take when he gets home, on iron, miralax, colace,senna to take per [facility] MD's,patient has gined 10lbs in last week, appetite good, uses cane, has a walker at home uses prn, , energy level is low , cat naps at night,sleeps in recliner, hasn't taken xanax last 2 weeks, has Xeloda and has started taking bid

## 2013-12-23 ENCOUNTER — Ambulatory Visit: Payer: Medicaid Other

## 2013-12-23 ENCOUNTER — Ambulatory Visit
Admission: RE | Admit: 2013-12-23 | Discharge: 2013-12-23 | Disposition: A | Discharge status: Home / Self Care | Payer: Medicaid Other | Source: Ambulatory Visit | Attending: Radiation Oncology | Admitting: Radiation Oncology

## 2013-12-23 DIAGNOSIS — Z510 Encounter for antineoplastic radiation therapy: Secondary | ICD-10-CM | POA: Diagnosis not present

## 2013-12-24 ENCOUNTER — Ambulatory Visit: Payer: Medicaid Other

## 2013-12-24 ENCOUNTER — Ambulatory Visit
Admission: RE | Admit: 2013-12-24 | Discharge: 2013-12-24 | Disposition: A | Discharge status: Home / Self Care | Payer: Medicaid Other | Source: Ambulatory Visit | Attending: Radiation Oncology | Admitting: Radiation Oncology

## 2013-12-24 DIAGNOSIS — Z510 Encounter for antineoplastic radiation therapy: Secondary | ICD-10-CM | POA: Diagnosis not present

## 2013-12-25 ENCOUNTER — Other Ambulatory Visit (HOSPITAL_BASED_OUTPATIENT_CLINIC_OR_DEPARTMENT_OTHER): Payer: Self-pay

## 2013-12-25 ENCOUNTER — Ambulatory Visit: Payer: Medicaid Other

## 2013-12-25 ENCOUNTER — Ambulatory Visit
Admission: RE | Admit: 2013-12-25 | Discharge: 2013-12-25 | Disposition: A | Discharge status: Home / Self Care | Payer: Medicaid Other | Source: Ambulatory Visit | Attending: Radiation Oncology | Admitting: Radiation Oncology

## 2013-12-25 ENCOUNTER — Ambulatory Visit (HOSPITAL_BASED_OUTPATIENT_CLINIC_OR_DEPARTMENT_OTHER): Payer: Self-pay | Admitting: Nurse Practitioner

## 2013-12-25 VITALS — BP 129/73 | HR 99 | Temp 98.5°F | Resp 18 | Ht 76.0 in | Wt 147.2 lb

## 2013-12-25 DIAGNOSIS — C20 Malignant neoplasm of rectum: Secondary | ICD-10-CM

## 2013-12-25 DIAGNOSIS — Z510 Encounter for antineoplastic radiation therapy: Secondary | ICD-10-CM | POA: Diagnosis not present

## 2013-12-25 DIAGNOSIS — K6289 Other specified diseases of anus and rectum: Secondary | ICD-10-CM

## 2013-12-25 DIAGNOSIS — R634 Abnormal weight loss: Secondary | ICD-10-CM

## 2013-12-25 DIAGNOSIS — D509 Iron deficiency anemia, unspecified: Secondary | ICD-10-CM

## 2013-12-25 DIAGNOSIS — R630 Anorexia: Secondary | ICD-10-CM

## 2013-12-25 DIAGNOSIS — R918 Other nonspecific abnormal finding of lung field: Secondary | ICD-10-CM

## 2013-12-25 LAB — COMPREHENSIVE METABOLIC PANEL
ALK PHOS: 59 U/L (ref 39–117)
ALT: <5 U/L (ref 0–53)
AST: 11 U/L (ref 0–37)
Albumin: 2.2 g/dL — ABNORMAL LOW (ref 3.5–5.2)
BUN: 16 mg/dL (ref 6–23)
CO2: 27 mEq/L (ref 19–32)
CREATININE: 1.05 mg/dL (ref 0.50–1.35)
Calcium: 9.2 mg/dL (ref 8.4–10.5)
Chloride: 92 mEq/L — ABNORMAL LOW (ref 96–112)
GLUCOSE: 129 mg/dL — AB (ref 70–99)
Potassium: 4.6 mEq/L (ref 3.5–5.3)
Sodium: 131 mEq/L — ABNORMAL LOW (ref 135–145)
Total Bilirubin: 0.2 mg/dL — ABNORMAL LOW (ref 0.2–1.2)
Total Protein: 6.9 g/dL (ref 6.0–8.3)

## 2013-12-25 LAB — CBC WITH DIFFERENTIAL/PLATELET
BASO%: 0.4 % (ref 0.0–2.0)
BASOS ABS: 0 10*3/uL (ref 0.0–0.1)
EOS ABS: 0 10*3/uL (ref 0.0–0.5)
EOS%: 0.6 % (ref 0.0–7.0)
HEMATOCRIT: 25.5 % — AB (ref 38.4–49.9)
HEMOGLOBIN: 8.1 g/dL — AB (ref 13.0–17.1)
LYMPH%: 6.6 % — ABNORMAL LOW (ref 14.0–49.0)
MCH: 24.1 pg — AB (ref 27.2–33.4)
MCHC: 31.9 g/dL — ABNORMAL LOW (ref 32.0–36.0)
MCV: 75.3 fL — AB (ref 79.3–98.0)
MONO#: 0.3 10*3/uL (ref 0.1–0.9)
MONO%: 5.5 % (ref 0.0–14.0)
NEUT#: 4.9 10*3/uL (ref 1.5–6.5)
NEUT%: 86.9 % — AB (ref 39.0–75.0)
Platelets: 563 10*3/uL — ABNORMAL HIGH (ref 140–400)
RBC: 3.38 10*6/uL — ABNORMAL LOW (ref 4.20–5.82)
RDW: 20.8 % — ABNORMAL HIGH (ref 11.0–14.6)
WBC: 5.6 10*3/uL (ref 4.0–10.3)
lymph#: 0.4 10*3/uL — ABNORMAL LOW (ref 0.9–3.3)

## 2013-12-25 MED ORDER — OXYCODONE-ACETAMINOPHEN 5-325 MG PO TABS: 2.0000 | ORAL_TABLET | Freq: Four times a day (QID) | ORAL | Status: DC | PRN

## 2013-12-25 MED ORDER — OXYCODONE-ACETAMINOPHEN 5-325 MG PO TABS
ORAL_TABLET | ORAL | Status: AC
  Filled 2013-12-25: qty 2

## 2013-12-25 MED ORDER — OXYCODONE-ACETAMINOPHEN 5-325 MG PO TABS
2.0000 | ORAL_TABLET | Freq: Once | ORAL | Status: AC
  Administered 2013-12-25: 2 via ORAL

## 2013-12-25 NOTE — Progress Notes (Signed)
  [facility] OFFICE PROGRESS NOTE   Diagnosis:  Rectal cancer.  INTERVAL HISTORY:   Grant [name] returns as scheduled. He began radiation and Xeloda on 12/18/2013. He denies nausea/vomiting. No mouth sores. Colostomy is functioning. He estimates emptying the collection bag 3-4 times a day when it is approximately half full. Output is liquid. No hand or foot pain or redness. He continues to have pain at the rectum. He is taking Percocet about every 4 hours. He notes a mucousy rectal discharge which is mixed with blood at times. He reports developing leg swelling prior to discharge from [facility]. The swelling is improving.  Objective:  Vital signs in last 24 hours:  Blood pressure 129/73, pulse 99, temperature 98.5 F (36.9 C), temperature source Oral, resp. rate 18, height 6\' 4"  (1.93 m), weight 147 lb 3.2 oz (66.769 kg), SpO2 100.00%.    HEENT: No thrush or ulcers. Resp: Lungs clear bilaterally. Cardio: Regular rate and rhythm. GI: Abdomen is soft. No hepatomegaly. Left lower quadrant colostomy. Stoma is pink. Thick liquid output in the ostomy collection bag. Vascular: Pitting edema bilateral ankles/feet. Calves soft and nontender. Skin: Palms without erythema.    Lab Results:  Lab Results  Component Value Date   WBC 5.6 12/25/2013   HGB 8.1* 12/25/2013   HCT 25.5* 12/25/2013   MCV 75.3* 12/25/2013   PLT 563* 12/25/2013   NEUTROABS 4.9 12/25/2013    Imaging:  No results found.  Medications: I have reviewed the patient's current medications.  Assessment/Plan: 1. Rectal cancer-large pelvic mass with a colonoscopy 10/11/2013 confirming an obstructing tumor at 9 cm from the anal verge CTs of the chest, abdomen, and revealed a large complex pelvic mass, gas in the bladder, loculated gas posterior to the bladder, right hydronephrosis, and small bilateral indeterminant lung nodules. Initiation of radiation and Xeloda 12/18/2013. 2. Right hydronephrosis secondary to the  obstructing pelvic mass, status post placement of a right percutaneous nephrostomy tube followed by a right ureter stent. 3. Microcytic anemia-likely iron deficiency anemia secondary to bleeding from the rectal tumor-now taking iron.  4. Possible colovesical fistula-on the CT 10/09/2013 evaluated by Dr. Alinda Money, followup CT cystogram 10/18/2013 revealed no fistula.  5. Anorexia/weight loss secondary to #1. 6. Indeterminate lung nodules on a CT of the chest 10/09/2013.  7. Family history of pancreas and colon cancer. 8. 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 10. Hospitalization 12/10/2013 through 12/12/2013 presenting with diarrhea. CT abdomen/pelvis showed an enlarging rectal mass filling almost the entire pelvis. Air noted in the bladder and renal collecting system. Linear collection of air noted along the anterior wall of the colorectal mass abutting the bladder felt to possibly represent a site of fistulization. Stool noted throughout the right colon. Transferred to [facility]. Neoadjuvant chemotherapy/radiation recommended.   Disposition: Grant [name] continues radiation/Xeloda. Thus far he appears to be tolerating the Xeloda well. He continues to have significant pain at the rectum. We gave him 2 Percocet while in the office today. He was also provided with a new Percocet prescription. He will return for a followup visit in approximately 2 weeks. He will contact the office in the interim with any problems.    Ned Card ANP/GNP-BC   12/25/2013  2:40 PM

## 2013-12-26 ENCOUNTER — Ambulatory Visit
Admission: RE | Admit: 2013-12-26 | Discharge: 2013-12-26 | Disposition: A | Discharge status: Home / Self Care | Payer: Medicaid Other | Source: Ambulatory Visit | Attending: Radiation Oncology | Admitting: Radiation Oncology

## 2013-12-26 ENCOUNTER — Ambulatory Visit: Payer: Medicaid Other

## 2013-12-26 DIAGNOSIS — Z510 Encounter for antineoplastic radiation therapy: Secondary | ICD-10-CM | POA: Diagnosis not present

## 2013-12-27 ENCOUNTER — Encounter: Payer: Self-pay | Admitting: Radiation Oncology

## 2013-12-27 ENCOUNTER — Ambulatory Visit
Admission: RE | Admit: 2013-12-27 | Discharge: 2013-12-27 | Disposition: A | Discharge status: Home / Self Care | Payer: Medicaid Other | Source: Ambulatory Visit | Attending: Radiation Oncology | Admitting: Radiation Oncology

## 2013-12-27 ENCOUNTER — Ambulatory Visit: Payer: Medicaid Other

## 2013-12-27 VITALS — BP 121/77 | HR 98 | Temp 98.8°F | Resp 20

## 2013-12-27 DIAGNOSIS — C20 Malignant neoplasm of rectum: Secondary | ICD-10-CM

## 2013-12-27 DIAGNOSIS — Z510 Encounter for antineoplastic radiation therapy: Secondary | ICD-10-CM | POA: Diagnosis not present

## 2013-12-27 NOTE — Progress Notes (Signed)
   Department of Radiation Oncology  Phone:  [phone] Fax:        [phone]  Weekly Treatment Note    Name: Grant [name] Date: 12/27/2013 MRN: [id number] DOB: [birth year]   Current dose: 14.4 Gy  Current fraction: 8   MEDICATIONS: Current Outpatient Prescriptions  Medication Sig Dispense Refill  . ALPRAZolam (XANAX) 0.5 MG tablet Take 1 tablet (0.5 mg total) by mouth 2 (two) times daily as needed for anxiety.  60 tablet  1  . capecitabine (XELODA) 500 MG tablet Take 3 tablets (1,500 mg total) by mouth 2 (two) times daily after a meal. On days of radiation only (Mon-Fri)  168 tablet  0  . docusate (COLACE) 50 MG/5ML liquid Take 100 mg by mouth.      . ferrous sulfate 325 (65 FE) MG tablet Take 1 tablet (325 mg total) by mouth 2 (two) times daily with a meal.  60 tablet  0  . lactose free nutrition (BOOST PLUS) LIQD Take 237 mLs by mouth 3 (three) times daily with meals.  60 Can  0  . levofloxacin (LEVAQUIN) 500 MG tablet Take 500 mg by mouth.      . metroNIDAZOLE (FLAGYL) 500 MG tablet Take 500 mg by mouth.      . Multiple Vitamins-Minerals ([employer]S SPECTRAVITE ULTRA MENS) TABS Take by mouth.      . oxyCODONE-acetaminophen (PERCOCET/ROXICET) 5-325 MG per tablet Take 2 tablets by mouth every 6 (six) hours as needed for severe pain.  75 tablet  0  . senna (SENOKOT) 8.6 MG TABS tablet Take by mouth.       No current facility-administered medications for this encounter.     ALLERGIES: Strawberry; Sunflower oil; and Watermelon   LABORATORY DATA:  Lab Results  Component Value Date   WBC 5.6 12/25/2013   HGB 8.1* 12/25/2013   HCT 25.5* 12/25/2013   MCV 75.3* 12/25/2013   PLT 563* 12/25/2013   Lab Results  Component Value Date   NA 131* 12/25/2013   K 4.6 12/25/2013   CL 92* 12/25/2013   CO2 27 12/25/2013   Lab Results  Component Value Date   ALT <5 12/25/2013   AST 11 12/25/2013   ALKPHOS 59 12/25/2013   BILITOT 0.2* 12/25/2013     NARRATIVE: Grant [name] was seen  today for weekly treatment management. The chart was checked and the patient's films were reviewed. The patient is doing well today. He is notice some discharge rectally but no real difficulties in terms of bleeding per or diarrhea.  PHYSICAL EXAMINATION: oral temperature is 98.8 F (37.1 C). His blood pressure is 121/77 and his pulse is 98. His respiration is 20.        ASSESSMENT: The patient is doing satisfactorily with treatment.  PLAN: We will continue with the patient's radiation treatment as planned. The patient is doing well overall with no major difficulties this next week. We will contact the social worker regarding advice in terms of helping him obtain ostomy bags which was a question of theirs.

## 2013-12-27 NOTE — Progress Notes (Signed)
Weekly rad tx s pelvis, 8/25 completed, patient has an ostomy bag, thin, liquid black colored stool, he emptys his ostomy bag 3-4 times a day,  he is taking iron bid, eating okay, and drinks boost, in w/c, they are asking about help with cost of ostomy bags, they are paying for a box of 10, $50.00, will defer to Social workers, will in basket them to call patient on Monday, no c/o  Rectal pain  At present ,he is taking his percocet  meds  4every 4 hours prn stated  weak,  3:59 PM

## 2013-12-28 ENCOUNTER — Telehealth: Payer: Self-pay | Admitting: Oncology

## 2013-12-28 NOTE — Telephone Encounter (Signed)
S/W PT RE APPT FOR 8/28.

## 2013-12-30 ENCOUNTER — Ambulatory Visit
Admission: RE | Admit: 2013-12-30 | Discharge: 2013-12-30 | Disposition: A | Discharge status: Home / Self Care | Payer: Medicaid Other | Source: Ambulatory Visit | Attending: Radiation Oncology | Admitting: Radiation Oncology

## 2013-12-30 ENCOUNTER — Ambulatory Visit: Payer: Medicaid Other

## 2013-12-30 DIAGNOSIS — Z510 Encounter for antineoplastic radiation therapy: Secondary | ICD-10-CM | POA: Diagnosis not present

## 2013-12-31 ENCOUNTER — Ambulatory Visit: Payer: Medicaid Other

## 2013-12-31 ENCOUNTER — Telehealth: Payer: Self-pay | Admitting: *Deleted

## 2013-12-31 ENCOUNTER — Ambulatory Visit
Admission: RE | Admit: 2013-12-31 | Discharge: 2013-12-31 | Disposition: A | Discharge status: Home / Self Care | Payer: Medicaid Other | Source: Ambulatory Visit | Attending: Radiation Oncology | Admitting: Radiation Oncology

## 2013-12-31 DIAGNOSIS — Z510 Encounter for antineoplastic radiation therapy: Secondary | ICD-10-CM | POA: Diagnosis not present

## 2013-12-31 NOTE — Telephone Encounter (Signed)
Was notified by SW that patient needs assistance with ostomy supplies.  Spoke with Joelene Millin Programmer, multimedia for patient assistance program at [employer] option 6) re: patient's need for assistance with ostomy supplies and his inability to afford ostomy bags, etc.  She stated she would contact the patient and ask more questions and would arrange to help him by sending him supplies.

## 2014-01-01 ENCOUNTER — Ambulatory Visit: Payer: Medicaid Other

## 2014-01-01 ENCOUNTER — Ambulatory Visit
Admission: RE | Admit: 2014-01-01 | Discharge: 2014-01-01 | Disposition: A | Discharge status: Home / Self Care | Payer: Medicaid Other | Source: Ambulatory Visit | Attending: Radiation Oncology | Admitting: Radiation Oncology

## 2014-01-01 DIAGNOSIS — Z510 Encounter for antineoplastic radiation therapy: Secondary | ICD-10-CM | POA: Diagnosis not present

## 2014-01-01 NOTE — Progress Notes (Signed)
Called and spoke with Raquel Sarna, RT therapist , she stated"patient didn't feel like coming around to be seen  Today",asked if she paged MD so he could go back there to see the patient?,"No, Md can see him Thursday or Friday", will inform Dr.moody 3:51 PM

## 2014-01-02 ENCOUNTER — Ambulatory Visit
Admission: RE | Admit: 2014-01-02 | Discharge: 2014-01-02 | Disposition: A | Discharge status: Home / Self Care | Payer: Medicaid Other | Source: Ambulatory Visit | Attending: Radiation Oncology | Admitting: Radiation Oncology

## 2014-01-02 ENCOUNTER — Ambulatory Visit: Payer: Medicaid Other | Admitting: Radiation Oncology

## 2014-01-02 ENCOUNTER — Encounter: Payer: Self-pay | Admitting: *Deleted

## 2014-01-02 ENCOUNTER — Telehealth: Payer: Self-pay | Admitting: *Deleted

## 2014-01-02 ENCOUNTER — Ambulatory Visit: Payer: Medicaid Other

## 2014-01-02 DIAGNOSIS — Z510 Encounter for antineoplastic radiation therapy: Secondary | ICD-10-CM | POA: Diagnosis not present

## 2014-01-02 NOTE — Progress Notes (Signed)
Hether, RT therapist clled statting"patient refused to come around to see nursing or MD,he's tired and left" 4:03 PM

## 2014-01-02 NOTE — Telephone Encounter (Signed)
Attempted to reach patient without success.  Left voice message to return call with contact name and number.

## 2014-01-03 ENCOUNTER — Ambulatory Visit (HOSPITAL_COMMUNITY)
Admission: RE | Admit: 2014-01-03 | Discharge: 2014-01-03 | Disposition: A | Discharge status: Home / Self Care | Payer: Medicaid Other | Source: Ambulatory Visit | Attending: Oncology | Admitting: Oncology

## 2014-01-03 ENCOUNTER — Other Ambulatory Visit: Payer: Self-pay | Admitting: Nurse Practitioner

## 2014-01-03 ENCOUNTER — Ambulatory Visit: Payer: Medicaid Other

## 2014-01-03 ENCOUNTER — Telehealth: Payer: Self-pay | Admitting: Nurse Practitioner

## 2014-01-03 ENCOUNTER — Ambulatory Visit
Admission: RE | Admit: 2014-01-03 | Discharge: 2014-01-03 | Disposition: A | Discharge status: Home / Self Care | Payer: Medicaid Other | Source: Ambulatory Visit | Attending: Radiation Oncology | Admitting: Radiation Oncology

## 2014-01-03 ENCOUNTER — Telehealth: Payer: Self-pay | Admitting: *Deleted

## 2014-01-03 DIAGNOSIS — R600 Localized edema: Secondary | ICD-10-CM

## 2014-01-03 DIAGNOSIS — R609 Edema, unspecified: Secondary | ICD-10-CM | POA: Insufficient documentation

## 2014-01-03 DIAGNOSIS — R599 Enlarged lymph nodes, unspecified: Secondary | ICD-10-CM | POA: Diagnosis not present

## 2014-01-03 DIAGNOSIS — Z510 Encounter for antineoplastic radiation therapy: Secondary | ICD-10-CM | POA: Diagnosis not present

## 2014-01-03 DIAGNOSIS — C20 Malignant neoplasm of rectum: Secondary | ICD-10-CM

## 2014-01-03 NOTE — Telephone Encounter (Signed)
Called patient home, no answer,left message on patient's phone, to let me know if he had heard from the [place] coordinator that Phillips Grout, had given her your phone number, and Barnett Applebaum had tried leaving word with the patient,without success, will try and get with the patient with his radaiation time today at 250PM 10:38 AM

## 2014-01-03 NOTE — Progress Notes (Signed)
Bilateral lower extremity venous duplex completed:  No evidence of DVT, superficial thrombosis, or Baker's cyst.   

## 2014-01-03 NOTE — Telephone Encounter (Signed)
Cld Kristen at [facility] to set up apt for doppler, cld pt to see about coming in at 5:30 , gave pt's wife #  to call [facility] to set up apt.

## 2014-01-06 ENCOUNTER — Ambulatory Visit: Payer: Medicaid Other

## 2014-01-06 ENCOUNTER — Encounter: Payer: Self-pay | Admitting: Radiation Oncology

## 2014-01-06 ENCOUNTER — Ambulatory Visit
Admission: RE | Admit: 2014-01-06 | Discharge: 2014-01-06 | Disposition: A | Discharge status: Home / Self Care | Payer: Medicaid Other | Source: Ambulatory Visit | Attending: Radiation Oncology | Admitting: Radiation Oncology

## 2014-01-06 VITALS — BP 125/73 | HR 89 | Temp 98.9°F | Resp 20 | Wt 152.7 lb

## 2014-01-06 DIAGNOSIS — Z510 Encounter for antineoplastic radiation therapy: Secondary | ICD-10-CM | POA: Diagnosis not present

## 2014-01-06 DIAGNOSIS — C20 Malignant neoplasm of rectum: Secondary | ICD-10-CM

## 2014-01-06 NOTE — Progress Notes (Signed)
Weekly rad txs prlvis 14/25 compkleted, patient pain abdomen is taking oxiocodone 5mg  q 4hours given last Thursday at [facility], not helping, has run out of percocet, ostomy bag bm's  Emptied 3x day, fatigued, appetite comes and goes, sometimes only eats a few bites and stops, feet swelling, on aldactone now has improved  3:57 PM

## 2014-01-06 NOTE — Progress Notes (Signed)
Department of Radiation Oncology  Phone:  [phone] Fax:        [phone]  Weekly Treatment Note    Name: Grant [name] Date: 01/06/2014 MRN: [id number] DOB: 1955/07/29   Current dose: 25.2 Gy  Current fraction: 14   MEDICATIONS: Current Outpatient Prescriptions  Medication Sig Dispense Refill  . oxyCODONE (OXY IR/ROXICODONE) 5 MG immediate release tablet Take 5 mg by mouth.      . spironolactone (ALDACTONE) 25 MG tablet Take 25 mg by mouth.      . ALPRAZolam (XANAX) 0.5 MG tablet Take 1 tablet (0.5 mg total) by mouth 2 (two) times daily as needed for anxiety.  60 tablet  1  . capecitabine (XELODA) 500 MG tablet Take 3 tablets (1,500 mg total) by mouth 2 (two) times daily after a meal. On days of radiation only (Mon-Fri)  168 tablet  0  . docusate (COLACE) 50 MG/5ML liquid Take 100 mg by mouth.      . ferrous sulfate 325 (65 FE) MG tablet Take 1 tablet (325 mg total) by mouth 2 (two) times daily with a meal.  60 tablet  0  . lactose free nutrition (BOOST PLUS) LIQD Take 237 mLs by mouth 3 (three) times daily with meals.  60 Can  0  . lactose free nutrition (BOOST) LIQD Take by mouth.      . Multiple Vitamins-Minerals ([employer] SPECTRAVITE ULTRA MENS) TABS Take by mouth.      . oxyCODONE-acetaminophen (PERCOCET/ROXICET) 5-325 MG per tablet Take 2 tablets by mouth every 6 (six) hours as needed for severe pain.  75 tablet  0  . senna (SENOKOT) 8.6 MG TABS tablet Take by mouth.       No current facility-administered medications for this encounter.     ALLERGIES: Strawberry; Sunflower oil; and Watermelon   LABORATORY DATA:  Lab Results  Component Value Date   WBC 5.6 12/25/2013   HGB 8.1* 12/25/2013   HCT 25.5* 12/25/2013   MCV 75.3* 12/25/2013   PLT 563* 12/25/2013   Lab Results  Component Value Date   NA 131* 12/25/2013   K 4.6 12/25/2013   CL 92* 12/25/2013   CO2 27 12/25/2013   Lab Results  Component Value Date   ALT <5 12/25/2013   AST 11 12/25/2013   ALKPHOS  59 12/25/2013   BILITOT 0.2* 12/25/2013     NARRATIVE: Grant [name] was seen today for weekly treatment management. The chart was checked and the patient's films were reviewed. The patient states that his current pain medicine and is not adequate. He has changed to oxycodone 5 mg and looking back at his Percocet as he has decreased the dose of oxycodone. He previously was taking 10 mg oxycodone at a time in the form of Percocet. Otherwise the patient states that he is doing well.  PHYSICAL EXAMINATION: weight is 152 lb 11.2 oz (69.264 kg). His oral temperature is 98.9 F (37.2 C). His blood pressure is 125/73 and his pulse is 89. His respiration is 20.        ASSESSMENT: The patient is doing satisfactorily with treatment.  PLAN: We will continue with the patient's radiation treatment as planned. The patient will increase his dose to oxycodone 5 mg 2 tablets every 4 hours when necessary. He will let us know if this is not sufficient. The patient is approximately half way through his treatment and I am very pleased with how he is done so far.

## 2014-01-07 ENCOUNTER — Ambulatory Visit
Admission: RE | Admit: 2014-01-07 | Discharge: 2014-01-07 | Disposition: A | Discharge status: Home / Self Care | Payer: Medicaid Other | Source: Ambulatory Visit | Attending: Radiation Oncology | Admitting: Radiation Oncology

## 2014-01-07 ENCOUNTER — Ambulatory Visit: Payer: Medicaid Other

## 2014-01-07 DIAGNOSIS — Z510 Encounter for antineoplastic radiation therapy: Secondary | ICD-10-CM | POA: Diagnosis not present

## 2014-01-08 ENCOUNTER — Ambulatory Visit
Admission: RE | Admit: 2014-01-08 | Discharge: 2014-01-08 | Disposition: A | Discharge status: Home / Self Care | Payer: Medicaid Other | Source: Ambulatory Visit | Attending: Radiation Oncology | Admitting: Radiation Oncology

## 2014-01-08 ENCOUNTER — Ambulatory Visit: Payer: Medicaid Other

## 2014-01-08 DIAGNOSIS — Z510 Encounter for antineoplastic radiation therapy: Secondary | ICD-10-CM | POA: Diagnosis not present

## 2014-01-09 ENCOUNTER — Encounter: Payer: Self-pay | Admitting: Nutrition

## 2014-01-09 ENCOUNTER — Ambulatory Visit: Payer: Medicaid Other

## 2014-01-09 ENCOUNTER — Telehealth: Payer: Self-pay | Admitting: *Deleted

## 2014-01-09 ENCOUNTER — Encounter: Payer: Self-pay | Admitting: *Deleted

## 2014-01-09 ENCOUNTER — Ambulatory Visit
Admission: RE | Admit: 2014-01-09 | Discharge: 2014-01-09 | Disposition: A | Discharge status: Home / Self Care | Payer: Medicaid Other | Source: Ambulatory Visit | Attending: Radiation Oncology | Admitting: Radiation Oncology

## 2014-01-09 DIAGNOSIS — Z510 Encounter for antineoplastic radiation therapy: Secondary | ICD-10-CM | POA: Diagnosis not present

## 2014-01-09 NOTE — Progress Notes (Signed)
[place] Work  Clinical Social Work was referred by patient navigator for assessment of psychosocial needs due to issues with disabilty.  Clinical Social Worker attempted to contact patient at home to offer support and assess for needs.  CSW left vm for pt to call CSW. CSW will continue to reach out to pt.   Clinical Social Work interventions: Resource follow up  Loren Racer, [place] Worker Doris S. Berry for [place] Wednesday, Thursday and Friday Phone: [phone] Fax: [phone]

## 2014-01-09 NOTE — Telephone Encounter (Signed)
Met with patient on 01/08/14 to follow-up.  He stated [employer] had contacted him and had sent him some ostomy supplies.  He stated he was in the process of completing a financial need application with [employer].  He stated he had not been accepted to receive Medicaid or disability and that he did not understand why.  He could not say who he had been working with on this, but that it was not someone from [facility].   A referral was made to [facility] SW who is going to try to reach the patient and assist as needed.  Patient expressed appreciation and denied further needs.  His significant other was present.

## 2014-01-10 ENCOUNTER — Ambulatory Visit (HOSPITAL_BASED_OUTPATIENT_CLINIC_OR_DEPARTMENT_OTHER): Payer: Self-pay | Admitting: Nurse Practitioner

## 2014-01-10 ENCOUNTER — Other Ambulatory Visit (HOSPITAL_BASED_OUTPATIENT_CLINIC_OR_DEPARTMENT_OTHER): Payer: Self-pay

## 2014-01-10 ENCOUNTER — Ambulatory Visit: Admission: RE | Admit: 2014-01-10 | Payer: Medicaid Other | Source: Ambulatory Visit | Admitting: Radiation Oncology

## 2014-01-10 ENCOUNTER — Telehealth: Payer: Self-pay | Admitting: Nurse Practitioner

## 2014-01-10 ENCOUNTER — Encounter (INDEPENDENT_AMBULATORY_CARE_PROVIDER_SITE_OTHER): Payer: Self-pay | Admitting: General Surgery

## 2014-01-10 ENCOUNTER — Ambulatory Visit
Admission: RE | Admit: 2014-01-10 | Discharge: 2014-01-10 | Disposition: A | Discharge status: Home / Self Care | Payer: Medicaid Other | Source: Ambulatory Visit | Attending: Radiation Oncology | Admitting: Radiation Oncology

## 2014-01-10 ENCOUNTER — Ambulatory Visit: Payer: Medicaid Other

## 2014-01-10 VITALS — BP 104/73 | HR 97 | Temp 98.3°F | Resp 19 | Ht 76.0 in | Wt 138.8 lb

## 2014-01-10 DIAGNOSIS — Z510 Encounter for antineoplastic radiation therapy: Secondary | ICD-10-CM | POA: Diagnosis not present

## 2014-01-10 DIAGNOSIS — C20 Malignant neoplasm of rectum: Secondary | ICD-10-CM

## 2014-01-10 DIAGNOSIS — N1330 Unspecified hydronephrosis: Secondary | ICD-10-CM

## 2014-01-10 LAB — CBC WITH DIFFERENTIAL/PLATELET
BASO%: 0.5 % (ref 0.0–2.0)
Basophils Absolute: 0 10*3/uL (ref 0.0–0.1)
EOS ABS: 0.1 10*3/uL (ref 0.0–0.5)
EOS%: 1.8 % (ref 0.0–7.0)
HEMATOCRIT: 29.6 % — AB (ref 38.4–49.9)
HGB: 9.4 g/dL — ABNORMAL LOW (ref 13.0–17.1)
LYMPH#: 0.4 10*3/uL — AB (ref 0.9–3.3)
LYMPH%: 10.3 % — ABNORMAL LOW (ref 14.0–49.0)
MCH: 25.9 pg — AB (ref 27.2–33.4)
MCHC: 31.8 g/dL — ABNORMAL LOW (ref 32.0–36.0)
MCV: 81.5 fL (ref 79.3–98.0)
MONO#: 0.4 10*3/uL (ref 0.1–0.9)
MONO%: 9 % (ref 0.0–14.0)
NEUT%: 78.4 % — AB (ref 39.0–75.0)
NEUTROS ABS: 3 10*3/uL (ref 1.5–6.5)
Platelets: 341 10*3/uL (ref 140–400)
RBC: 3.63 10*6/uL — ABNORMAL LOW (ref 4.20–5.82)
RDW: 23.1 % — ABNORMAL HIGH (ref 11.0–14.6)
WBC: 3.9 10*3/uL — ABNORMAL LOW (ref 4.0–10.3)

## 2014-01-10 LAB — COMPREHENSIVE METABOLIC PANEL (CC13)
ALBUMIN: 2.8 g/dL — AB (ref 3.5–5.0)
ALT: 7 U/L (ref 0–55)
ANION GAP: 9 meq/L (ref 3–11)
AST: 10 U/L (ref 5–34)
Alkaline Phosphatase: 83 U/L (ref 40–150)
BUN: 17.3 mg/dL (ref 7.0–26.0)
CO2: 27 meq/L (ref 22–29)
Calcium: 9.6 mg/dL (ref 8.4–10.4)
Chloride: 94 mEq/L — ABNORMAL LOW (ref 98–109)
Creatinine: 1 mg/dL (ref 0.7–1.3)
Glucose: 182 mg/dl — ABNORMAL HIGH (ref 70–140)
POTASSIUM: 4.7 meq/L (ref 3.5–5.1)
Sodium: 130 mEq/L — ABNORMAL LOW (ref 136–145)
Total Bilirubin: 0.66 mg/dL (ref 0.20–1.20)
Total Protein: 8 g/dL (ref 6.4–8.3)

## 2014-01-10 MED ORDER — OXYCODONE HCL 5 MG PO TABS: 5.0000 mg | ORAL_TABLET | ORAL | Status: DC | PRN

## 2014-01-10 NOTE — Telephone Encounter (Signed)
Pt confirmed labs/ov per 08/28 POF, gave pt AVS....KJ °

## 2014-01-10 NOTE — Progress Notes (Signed)
  [place] OFFICE PROGRESS NOTE   Diagnosis:  Rectal cancer.  INTERVAL HISTORY:   Grant [name] returns as scheduled. He continues radiation and Xeloda. No nausea except a single mild episode earlier today. No vomiting. No mouth sores. He reports good output from the ostomy. No watery diarrhea. No hand or foot pain or redness. He continues to have rectal pain. He is taking oxycodone as needed. He has intermittent rectal bleeding/discharge. Overall the bleeding is better. The leg edema resolved following initiation of Aldactone. Appetite was good until 3 days ago. He reports good fluid intake. He is drinking 4 nutritional supplements a day.  Objective:  Vital signs in last 24 hours:  Blood pressure 104/73, pulse 97, temperature 98.3 F (36.8 C), temperature source Oral, resp. rate 19, height 6\' 4"  (1.93 m), weight 138 lb 12.8 oz (62.959 kg).    HEENT: No thrush or ulcers. Resp: Lungs clear bilaterally. Cardio: Regular rate and rhythm. GI: Abdomen is soft. No hepatomegaly. Left lower quadrant colostomy. Stoma is pink. Thick liquid output in the ostomy collection bag. Vascular: No leg edema. Neuro: Alert and oriented.  Skin: Palms without erythema. 2 cm area of superficial skin breakdown just to the left of the upper gluteal fold.    Lab Results:  Lab Results  Component Value Date   WBC 3.9* 01/10/2014   HGB 9.4* 01/10/2014   HCT 29.6* 01/10/2014   MCV 81.5 01/10/2014   PLT 341 01/10/2014   NEUTROABS 3.0 01/10/2014    Imaging:  No results found.  Medications: I have reviewed the patient's current medications.  Assessment/Plan: 1. Rectal cancer-large pelvic mass with a colonoscopy 10/11/2013 confirming an obstructing tumor at 9 cm from the anal verge CTs of the chest, abdomen, and revealed a large complex pelvic mass, gas in the bladder, loculated gas posterior to the bladder, right hydronephrosis, and small bilateral indeterminant lung nodules.  Initiation of  radiation and Xeloda 12/18/2013. 2. Right hydronephrosis secondary to the obstructing pelvic mass, status post placement of a right percutaneous nephrostomy tube followed by a right ureter stent. 3. Microcytic anemia-likely iron deficiency anemia secondary to bleeding from the rectal tumor-now taking iron. Improved. 4. Possible colovesical fistula-on the CT 10/09/2013 evaluated by Grant [name], followup CT cystogram 10/18/2013 revealed no fistula.  5. Anorexia/weight loss secondary to #1. 6. Indeterminate lung nodules on a CT of the chest 10/09/2013.  7. Family history of pancreas and colon cancer. 8. 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 10. Hospitalization 12/10/2013 through 12/12/2013 presenting with diarrhea. CT abdomen/pelvis showed an enlarging rectal mass filling almost the entire pelvis. Air noted in the bladder and renal collecting system. Linear collection of air noted along the anterior wall of the colorectal mass abutting the bladder felt to possibly represent a site of fistulization. Stool noted throughout the right colon. Transferred to [facility]. Neoadjuvant chemotherapy/radiation recommended.   Disposition: Mr. Hack appears stable. He continues radiation and Xeloda. He continues to have significant pain at the rectum and was provided with a new prescription for oxycodone at today's visit. We scheduled a return visit on 01/27/2014. He will contact the office in the interim with any problems.  Mr. Walkowiak qualifies for complete medical disability beginning 11/08/2013. The period of disability will be at least one year.  Plan reviewed with Grant [name].  Grant [name] ANP/GNP-BC   01/10/2014  4:40 PM

## 2014-01-13 ENCOUNTER — Ambulatory Visit
Admission: RE | Admit: 2014-01-13 | Discharge: 2014-01-13 | Disposition: A | Discharge status: Home / Self Care | Payer: Medicaid Other | Source: Ambulatory Visit | Attending: Radiation Oncology | Admitting: Radiation Oncology

## 2014-01-13 ENCOUNTER — Encounter: Payer: Self-pay | Admitting: Radiation Oncology

## 2014-01-13 ENCOUNTER — Encounter (INDEPENDENT_AMBULATORY_CARE_PROVIDER_SITE_OTHER): Payer: Self-pay | Admitting: General Surgery

## 2014-01-13 ENCOUNTER — Ambulatory Visit: Payer: Medicaid Other

## 2014-01-13 VITALS — BP 127/75 | HR 93 | Temp 98.4°F | Resp 20 | Wt 143.3 lb

## 2014-01-13 DIAGNOSIS — Z510 Encounter for antineoplastic radiation therapy: Secondary | ICD-10-CM | POA: Diagnosis not present

## 2014-01-13 DIAGNOSIS — C20 Malignant neoplasm of rectum: Secondary | ICD-10-CM

## 2014-01-13 NOTE — Progress Notes (Signed)
   Department of Radiation Oncology  Phone:  [phone] Fax:        [phone]  Weekly Treatment Note    Name: Grant [name] Date: 01/13/2014 MRN: [id number] DOB: 1955/08/12   Current dose: 34.2 Gy  Current fraction: 19   MEDICATIONS: Current Outpatient Prescriptions  Medication Sig Dispense Refill  . ALPRAZolam (XANAX) 0.5 MG tablet Take 1 tablet (0.5 mg total) by mouth 2 (two) times daily as needed for anxiety.  60 tablet  1  . capecitabine (XELODA) 500 MG tablet Take 3 tablets (1,500 mg total) by mouth 2 (two) times daily after a meal. On days of radiation only (Mon-Fri)  168 tablet  0  . docusate (COLACE) 50 MG/5ML liquid Take 100 mg by mouth.      . ferrous sulfate 325 (65 FE) MG tablet Take 1 tablet (325 mg total) by mouth 2 (two) times daily with a meal.  60 tablet  0  . lactose free nutrition (BOOST PLUS) LIQD Take 237 mLs by mouth 3 (three) times daily with meals.  60 Can  0  . lactose free nutrition (BOOST) LIQD Take by mouth.      . Multiple Vitamins-Minerals ([employer] SPECTRAVITE ULTRA MENS) TABS Take by mouth.      . oxyCODONE (OXY IR/ROXICODONE) 5 MG immediate release tablet Take 1-2 tablets (5-10 mg total) by mouth every 4 (four) hours as needed for severe pain.  75 tablet  0  . oxyCODONE-acetaminophen (PERCOCET/ROXICET) 5-325 MG per tablet Take 2 tablets by mouth every 6 (six) hours as needed for severe pain.  75 tablet  0  . senna (SENOKOT) 8.6 MG TABS tablet Take by mouth.      . spironolactone (ALDACTONE) 25 MG tablet Take 25 mg by mouth.       No current facility-administered medications for this encounter.     ALLERGIES: Strawberry; Sunflower oil; and Watermelon   LABORATORY DATA:  Lab Results  Component Value Date   WBC 3.9* 01/10/2014   HGB 9.4* 01/10/2014   HCT 29.6* 01/10/2014   MCV 81.5 01/10/2014   PLT 341 01/10/2014   Lab Results  Component Value Date   NA 130* 01/10/2014   K 4.7 01/10/2014   CL 92* 12/25/2013   CO2 27 01/10/2014   Lab  Results  Component Value Date   ALT 7 01/10/2014   AST 10 01/10/2014   ALKPHOS 83 01/10/2014   BILITOT 0.66 01/10/2014     NARRATIVE: Grant [name] was seen today for weekly treatment management. The chart was checked and the patient's films were reviewed. Patient states he feels that he is getting stronger overall. He is starting to walk more. No worsening pain or difficulty with bowel movements currently.  PHYSICAL EXAMINATION: weight is 143 lb 4.8 oz (65 kg). His oral temperature is 98.4 F (36.9 C). His blood pressure is 127/75 and his pulse is 93. His respiration is 20.        ASSESSMENT: The patient is doing satisfactorily with treatment.  PLAN: We will continue with the patient's radiation treatment as planned. Very pleased with how the patient is doing so far.

## 2014-01-13 NOTE — Progress Notes (Addendum)
Weekly rad txs 19/25,completed, has discharge in  Rectum, , after rad txs , has colostomy bag with soft stool, says some blood in not much, pain a 6/10, took pain med 1.5 hour ago, starting to help, appetite, better, is starting to walk more, fatigued, swelling down in legs/feet, stopped fluid pill for now, , pain med helping better  3:36 PM

## 2014-01-14 ENCOUNTER — Ambulatory Visit
Admission: RE | Admit: 2014-01-14 | Discharge: 2014-01-14 | Disposition: A | Discharge status: Home / Self Care | Payer: Medicaid Other | Source: Ambulatory Visit | Attending: Radiation Oncology | Admitting: Radiation Oncology

## 2014-01-14 ENCOUNTER — Ambulatory Visit: Payer: Medicaid Other

## 2014-01-14 DIAGNOSIS — Z510 Encounter for antineoplastic radiation therapy: Secondary | ICD-10-CM | POA: Diagnosis not present

## 2014-01-15 ENCOUNTER — Ambulatory Visit
Admission: RE | Admit: 2014-01-15 | Discharge: 2014-01-15 | Disposition: A | Discharge status: Home / Self Care | Payer: Medicaid Other | Source: Ambulatory Visit | Attending: Radiation Oncology | Admitting: Radiation Oncology

## 2014-01-15 ENCOUNTER — Ambulatory Visit: Payer: Medicaid Other

## 2014-01-15 DIAGNOSIS — Z510 Encounter for antineoplastic radiation therapy: Secondary | ICD-10-CM | POA: Diagnosis not present

## 2014-01-16 ENCOUNTER — Ambulatory Visit: Payer: Medicaid Other

## 2014-01-16 ENCOUNTER — Ambulatory Visit
Admission: RE | Admit: 2014-01-16 | Discharge: 2014-01-16 | Disposition: A | Discharge status: Home / Self Care | Payer: Medicaid Other | Source: Ambulatory Visit | Attending: Radiation Oncology | Admitting: Radiation Oncology

## 2014-01-16 DIAGNOSIS — Z510 Encounter for antineoplastic radiation therapy: Secondary | ICD-10-CM | POA: Diagnosis not present

## 2014-01-17 ENCOUNTER — Encounter: Payer: Self-pay | Admitting: Radiation Oncology

## 2014-01-17 ENCOUNTER — Ambulatory Visit
Admission: RE | Admit: 2014-01-17 | Discharge: 2014-01-17 | Disposition: A | Discharge status: Home / Self Care | Payer: Medicaid Other | Source: Ambulatory Visit | Attending: Radiation Oncology | Admitting: Radiation Oncology

## 2014-01-17 ENCOUNTER — Ambulatory Visit: Payer: Medicaid Other

## 2014-01-17 VITALS — BP 106/66 | HR 93 | Temp 98.7°F | Resp 20 | Wt 145.2 lb

## 2014-01-17 DIAGNOSIS — C20 Malignant neoplasm of rectum: Secondary | ICD-10-CM

## 2014-01-17 DIAGNOSIS — Z510 Encounter for antineoplastic radiation therapy: Secondary | ICD-10-CM | POA: Diagnosis not present

## 2014-01-17 MED ORDER — OXYCODONE HCL 5 MG PO TABS: 5.0000 mg | ORAL_TABLET | ORAL | Status: DC | PRN

## 2014-01-17 NOTE — Progress Notes (Signed)
  Radiation Oncology         [id number]) [phone] ________________________________  Name: Grant [name] MRN: [id number]  Date: 01/17/2014  DOB: 1955-08-26  COMPLEX SIMULATION  NOTE  Diagnosis: rectal cancer  Narrative The patient has initially been planned to receive a course of radiation treatment to a dose of 45 gray in 25 fractions at 1.8 gray per fraction. The patient will now receive a boost to the high risk target volume for an additional 9 gray. This will be delivered in 3 fractions at 1.8 gray per fraction and a cone down boost technique will be utilized. To accomplish this, an additional 4 customized blocks have been designed for this purpose. A complex isodose plan is requested to ensure that the high-risk target region receives the appropriate radiation dose and that the nearby normal structures continue to be appropriately spared. The patient's final total dose therefore will be 54 gray.   ________________________________ ------------------------------------------------  Jodelle Gross, MD, PhD

## 2014-01-17 NOTE — Progress Notes (Signed)
   Department of Radiation Oncology  Phone:  [phone] Fax:        [phone]  Weekly Treatment Note    Name: Grant [name] Date: 01/17/2014 MRN: [id number] DOB: 1956-04-17   Current dose: 41.4 Gy  Current fraction: 23   MEDICATIONS: Current Outpatient Prescriptions  Medication Sig Dispense Refill  . ALPRAZolam (XANAX) 0.5 MG tablet Take 1 tablet (0.5 mg total) by mouth 2 (two) times daily as needed for anxiety.  60 tablet  1  . capecitabine (XELODA) 500 MG tablet Take 3 tablets (1,500 mg total) by mouth 2 (two) times daily after a meal. On days of radiation only (Mon-Fri)  168 tablet  0  . docusate (COLACE) 50 MG/5ML liquid Take 100 mg by mouth.      . ferrous sulfate 325 (65 FE) MG tablet Take 1 tablet (325 mg total) by mouth 2 (two) times daily with a meal.  60 tablet  0  . lactose free nutrition (BOOST PLUS) LIQD Take 237 mLs by mouth 3 (three) times daily with meals.  60 Can  0  . lactose free nutrition (BOOST) LIQD Take by mouth.      . Multiple Vitamins-Minerals ([employer] SPECTRAVITE ULTRA MENS) TABS Take by mouth.      . oxyCODONE (OXY IR/ROXICODONE) 5 MG immediate release tablet Take 1-2 tablets (5-10 mg total) by mouth every 4 (four) hours as needed for severe pain.  120 tablet  0  . oxyCODONE-acetaminophen (PERCOCET/ROXICET) 5-325 MG per tablet Take 2 tablets by mouth every 6 (six) hours as needed for severe pain.  75 tablet  0  . senna (SENOKOT) 8.6 MG TABS tablet Take by mouth.      . spironolactone (ALDACTONE) 25 MG tablet Take 25 mg by mouth.       No current facility-administered medications for this encounter.     ALLERGIES: Strawberry; Sunflower oil; and Watermelon   LABORATORY DATA:  Lab Results  Component Value Date   WBC 3.9* 01/10/2014   HGB 9.4* 01/10/2014   HCT 29.6* 01/10/2014   MCV 81.5 01/10/2014   PLT 341 01/10/2014   Lab Results  Component Value Date   NA 130* 01/10/2014   K 4.7 01/10/2014   CL 92* 12/25/2013   CO2 27 01/10/2014   Lab  Results  Component Value Date   ALT 7 01/10/2014   AST 10 01/10/2014   ALKPHOS 83 01/10/2014   BILITOT 0.66 01/10/2014     NARRATIVE: Grant [name] was seen today for weekly treatment management. The chart was checked and the patient's films were reviewed. The patient states he is doing relatively well. He requests a refill for his pain medicine. He is quite fatigued. Minimal nausea which has been transient.  PHYSICAL EXAMINATION: weight is 145 lb 3.2 oz (65.862 kg). His oral temperature is 98.7 F (37.1 C). His blood pressure is 106/66 and his pulse is 93. His respiration is 20.        ASSESSMENT: The patient is doing satisfactorily with treatment.  PLAN: We will continue with the patient's radiation treatment as planned. The patient was given a refill on his as requested.

## 2014-01-17 NOTE — Progress Notes (Addendum)
Weekly rad txs pelvis, 23 completed so far pelvis, fatigued, nausea in mornings, but passes quickly stated no emesis, , ostomy bag  Black thick,from iron, needs refill on OXY IR 5mg , legs some swelling,went back taking his fluid pil, dogged tired stated,  3:29 PM

## 2014-01-18 ENCOUNTER — Ambulatory Visit: Payer: Medicaid Other

## 2014-01-20 ENCOUNTER — Ambulatory Visit: Payer: Medicaid Other

## 2014-01-21 ENCOUNTER — Ambulatory Visit
Admission: RE | Admit: 2014-01-21 | Discharge: 2014-01-21 | Disposition: A | Discharge status: Home / Self Care | Payer: Medicaid Other | Source: Ambulatory Visit | Attending: Radiation Oncology | Admitting: Radiation Oncology

## 2014-01-21 ENCOUNTER — Ambulatory Visit: Payer: Medicaid Other | Admitting: Radiation Oncology

## 2014-01-21 DIAGNOSIS — Z510 Encounter for antineoplastic radiation therapy: Secondary | ICD-10-CM | POA: Diagnosis not present

## 2014-01-22 ENCOUNTER — Ambulatory Visit: Payer: Medicaid Other

## 2014-01-22 DIAGNOSIS — Z510 Encounter for antineoplastic radiation therapy: Secondary | ICD-10-CM | POA: Diagnosis not present

## 2014-01-23 ENCOUNTER — Ambulatory Visit: Admission: RE | Admit: 2014-01-23 | Payer: Medicaid Other | Source: Ambulatory Visit

## 2014-01-23 ENCOUNTER — Ambulatory Visit
Admission: RE | Admit: 2014-01-23 | Discharge: 2014-01-23 | Disposition: A | Discharge status: Home / Self Care | Payer: Medicaid Other | Source: Ambulatory Visit | Attending: Radiation Oncology | Admitting: Radiation Oncology

## 2014-01-23 ENCOUNTER — Ambulatory Visit: Payer: Medicaid Other

## 2014-01-23 ENCOUNTER — Encounter: Payer: Self-pay | Admitting: Radiation Oncology

## 2014-01-23 VITALS — BP 108/76 | HR 92 | Temp 98.3°F | Resp 20 | Wt 141.2 lb

## 2014-01-23 DIAGNOSIS — Z510 Encounter for antineoplastic radiation therapy: Secondary | ICD-10-CM | POA: Diagnosis not present

## 2014-01-23 DIAGNOSIS — C20 Malignant neoplasm of rectum: Secondary | ICD-10-CM

## 2014-01-23 NOTE — Progress Notes (Signed)
Weekly rad txs,26/30 pelvis completed, pain 8/10 scale back from lying on table for treatment, takes his pain med, appetite poor loss 4 olbs, drinks 4 ensure daily and eats soft foods, has questions about his xelod,will run out onMonday 4:05 PM

## 2014-01-23 NOTE — Progress Notes (Signed)
Department of Radiation Oncology  Phone:  [phone] Fax:        [phone]  Weekly Treatment Note    Name: Grant [name] Date: 01/23/2014 MRN: [id number] DOB: [birth year]   Current dose: 46.8 Gy  Current fraction:26   MEDICATIONS: Current Outpatient Prescriptions  Medication Sig Dispense Refill  . ALPRAZolam (XANAX) 0.5 MG tablet Take 1 tablet (0.5 mg total) by mouth 2 (two) times daily as needed for anxiety.  60 tablet  1  . capecitabine (XELODA) 500 MG tablet Take 3 tablets (1,500 mg total) by mouth 2 (two) times daily after a meal. On days of radiation only (Mon-Fri)  168 tablet  0  . docusate (COLACE) 50 MG/5ML liquid Take 100 mg by mouth.      . ferrous sulfate 325 (65 FE) MG tablet Take 1 tablet (325 mg total) by mouth 2 (two) times daily with a meal.  60 tablet  0  . lactose free nutrition (BOOST PLUS) LIQD Take 237 mLs by mouth 3 (three) times daily with meals.  60 Can  0  . lactose free nutrition (BOOST) LIQD Take by mouth.      . Multiple Vitamins-Minerals ([employer] SPECTRAVITE ULTRA MENS) TABS Take by mouth.      . oxyCODONE (OXY IR/ROXICODONE) 5 MG immediate release tablet Take 1-2 tablets (5-10 mg total) by mouth every 4 (four) hours as needed for severe pain.  120 tablet  0  . oxyCODONE-acetaminophen (PERCOCET/ROXICET) 5-325 MG per tablet Take 2 tablets by mouth every 6 (six) hours as needed for severe pain.  75 tablet  0  . senna (SENOKOT) 8.6 MG TABS tablet Take by mouth.      . spironolactone (ALDACTONE) 25 MG tablet Take 25 mg by mouth.       No current facility-administered medications for this encounter.     ALLERGIES: Strawberry; Sunflower oil; and Watermelon   LABORATORY DATA:  Lab Results  Component Value Date   WBC 3.9* 01/10/2014   HGB 9.4* 01/10/2014   HCT 29.6* 01/10/2014   MCV 81.5 01/10/2014   PLT 341 01/10/2014   Lab Results  Component Value Date   NA 130* 01/10/2014   K 4.7 01/10/2014   CL 92* 12/25/2013   CO2 27 01/10/2014   Lab  Results  Component Value Date   ALT 7 01/10/2014   AST 10 01/10/2014   ALKPHOS 83 01/10/2014   BILITOT 0.66 01/10/2014     NARRATIVE: Grant [name] was seen today for weekly treatment management. The chart was checked and the patient's films were reviewed. The patient continues to do quite well with treatment. He is receiving a total of 30 fractions of radiation and his low to chemotherapy will be available through Monday, 28 fractions. I discussed with the patient that I do not believe that this is a significant issue. This was the major question the patient had in addition to some irritation in the anal region as expected.  PHYSICAL EXAMINATION: weight is 141 lb 3.2 oz (64.048 kg). His oral temperature is 98.3 F (36.8 C). His blood pressure is 108/76 and his pulse is 92. His respiration is 20.        ASSESSMENT: The patient is doing satisfactorily with treatment.  PLAN: We will continue with the patient's radiation treatment as planned. The patient will use A&D ointment in the anal region. He'll let us know if this is not sufficient and we could give him other skin cream such as ointment with lidocaine if necessary.  Please with how the patient has done and we are on track to finish next week.

## 2014-01-24 ENCOUNTER — Ambulatory Visit: Payer: Medicaid Other

## 2014-01-24 ENCOUNTER — Ambulatory Visit
Admission: RE | Admit: 2014-01-24 | Discharge: 2014-01-24 | Disposition: A | Discharge status: Home / Self Care | Payer: Medicaid Other | Source: Ambulatory Visit | Attending: Radiation Oncology | Admitting: Radiation Oncology

## 2014-01-24 DIAGNOSIS — Z510 Encounter for antineoplastic radiation therapy: Secondary | ICD-10-CM | POA: Diagnosis not present

## 2014-01-27 ENCOUNTER — Telehealth: Payer: Self-pay | Admitting: Nurse Practitioner

## 2014-01-27 ENCOUNTER — Ambulatory Visit: Payer: Medicaid Other

## 2014-01-27 ENCOUNTER — Ambulatory Visit (HOSPITAL_BASED_OUTPATIENT_CLINIC_OR_DEPARTMENT_OTHER): Payer: Self-pay | Admitting: Nurse Practitioner

## 2014-01-27 ENCOUNTER — Other Ambulatory Visit (HOSPITAL_BASED_OUTPATIENT_CLINIC_OR_DEPARTMENT_OTHER): Payer: Self-pay

## 2014-01-27 ENCOUNTER — Ambulatory Visit
Admission: RE | Admit: 2014-01-27 | Discharge: 2014-01-27 | Disposition: A | Discharge status: Home / Self Care | Payer: Medicaid Other | Source: Ambulatory Visit | Attending: Radiation Oncology | Admitting: Radiation Oncology

## 2014-01-27 VITALS — BP 95/70 | HR 91 | Temp 98.4°F | Resp 18 | Ht 76.0 in | Wt 139.4 lb

## 2014-01-27 DIAGNOSIS — C20 Malignant neoplasm of rectum: Secondary | ICD-10-CM

## 2014-01-27 DIAGNOSIS — N1330 Unspecified hydronephrosis: Secondary | ICD-10-CM

## 2014-01-27 DIAGNOSIS — Z510 Encounter for antineoplastic radiation therapy: Secondary | ICD-10-CM | POA: Diagnosis not present

## 2014-01-27 LAB — CBC WITH DIFFERENTIAL/PLATELET
BASO%: 0.3 % (ref 0.0–2.0)
BASOS ABS: 0 10*3/uL (ref 0.0–0.1)
EOS ABS: 0.1 10*3/uL (ref 0.0–0.5)
EOS%: 2.7 % (ref 0.0–7.0)
HCT: 26.3 % — ABNORMAL LOW (ref 38.4–49.9)
HEMOGLOBIN: 8.5 g/dL — AB (ref 13.0–17.1)
LYMPH#: 0.1 10*3/uL — AB (ref 0.9–3.3)
LYMPH%: 2.8 % — ABNORMAL LOW (ref 14.0–49.0)
MCH: 27.9 pg (ref 27.2–33.4)
MCHC: 32.2 g/dL (ref 32.0–36.0)
MCV: 86.6 fL (ref 79.3–98.0)
MONO#: 0.3 10*3/uL (ref 0.1–0.9)
MONO%: 6 % (ref 0.0–14.0)
NEUT#: 3.9 10*3/uL (ref 1.5–6.5)
NEUT%: 88.2 % — ABNORMAL HIGH (ref 39.0–75.0)
Platelets: 331 10*3/uL (ref 140–400)
RBC: 3.03 10*6/uL — ABNORMAL LOW (ref 4.20–5.82)
RDW: 30.4 % — ABNORMAL HIGH (ref 11.0–14.6)
WBC: 4.4 10*3/uL (ref 4.0–10.3)

## 2014-01-27 MED ORDER — OXYCODONE HCL 5 MG PO TABS: 5.0000 mg | ORAL_TABLET | ORAL | Status: DC | PRN

## 2014-01-27 NOTE — Progress Notes (Signed)
  [name] OFFICE PROGRESS NOTE   Diagnosis:  Rectal cancer  INTERVAL HISTORY:   Grant [name] returns as scheduled. He continues radiation and Xeloda. He has intermittent nausea. No mouth sores. No hand or foot pain or redness. He reports good output from the ostomy. Oral intake is described as "fair". Leg swelling continues to be improved. He describes his urine as "murky". He continues to have rectal pain and is taking oxycodone as needed.  Objective:  Vital signs in last 24 hours:  Blood pressure 95/70, pulse 91, temperature 98.4 F (36.9 C), temperature source Oral, resp. rate 18, height 6\' 4"  (1.93 m), weight 139 lb 6.4 oz (63.231 kg).    HEENT: No thrush or ulcers. Resp: Lungs clear bilaterally. Cardio: Regular rate and rhythm. GI: Abdomen soft and nontender. No hepatomegaly. Left lower quadrant colostomy. Stoma pink. Vascular: No leg edema.  Skin: Palms are dry appearing; no erythema or skin breakdown. Skin breakdown along the gluteal fold. Superficial ulceration at the perineum. Erythema over the medial buttocks.    Lab Results:  Lab Results  Component Value Date   WBC 4.4 01/27/2014   HGB 8.5* 01/27/2014   HCT 26.3* 01/27/2014   MCV 86.6 01/27/2014   PLT 331 01/27/2014   NEUTROABS 3.9 01/27/2014    Imaging:  No results found.  Medications: I have reviewed the patient's current medications.  Assessment/Plan: 1. Rectal cancer-large pelvic mass with a colonoscopy 10/11/2013 confirming an obstructing tumor at 9 cm from the anal verge CTs of the chest, abdomen, and revealed a large complex pelvic mass, gas in the bladder, loculated gas posterior to the bladder, right hydronephrosis, and small bilateral indeterminant lung nodules.  Initiation of radiation and Xeloda 12/18/2013. 2. Right hydronephrosis secondary to the obstructing pelvic mass, status post placement of a right percutaneous nephrostomy tube followed by a right ureter stent. 3. Microcytic  anemia-likely iron deficiency anemia secondary to bleeding from the rectal tumor-now taking iron.  4. Possible colovesical fistula-on the CT 10/09/2013 evaluated by Dr. Alinda Money, followup CT cystogram 10/18/2013 revealed no fistula.  5. Anorexia/weight loss secondary to #1. 6. Indeterminate lung nodules on a CT of the chest 10/09/2013.  7. Family history of pancreas and colon cancer. 8. 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 10. Hospitalization 12/10/2013 through 12/12/2013 presenting with diarrhea. CT abdomen/pelvis showed an enlarging rectal mass filling almost the entire pelvis. Air noted in the bladder and renal collecting [name]. Linear collection of air noted along the anterior wall of the colorectal mass abutting the bladder felt to possibly represent a site of fistulization. Stool noted throughout the right colon. Transferred to [name]. Neoadjuvant chemotherapy/radiation recommended. 11. Skin toxicity perineum related to radiation and Xeloda.   Disposition: Grant [name] will complete the course of radiation on 01/29/2014. He will return for a followup CBC in 2 weeks. He is scheduled for an MRI at [name] in early October followed by an appointment with the surgeon. We scheduled a followup visit here on 03/03/2014. He will contact the office in the interim with any problems.  Plan reviewed with Dr. Benay [name].   Grant [name] ANP/GNP-BC   01/27/2014  4:44 PM

## 2014-01-27 NOTE — Telephone Encounter (Signed)
Pt confirmed labs/ov per 09/14 POF, gave pt AVS.....KJ °

## 2014-01-28 ENCOUNTER — Ambulatory Visit
Admission: RE | Admit: 2014-01-28 | Discharge: 2014-01-28 | Disposition: A | Discharge status: Home / Self Care | Payer: Medicaid Other | Source: Ambulatory Visit | Attending: Radiation Oncology | Admitting: Radiation Oncology

## 2014-01-28 ENCOUNTER — Ambulatory Visit: Payer: Medicaid Other

## 2014-01-28 ENCOUNTER — Encounter: Payer: Self-pay | Admitting: Family Medicine

## 2014-01-28 ENCOUNTER — Ambulatory Visit (INDEPENDENT_AMBULATORY_CARE_PROVIDER_SITE_OTHER): Payer: Self-pay | Admitting: Family Medicine

## 2014-01-28 VITALS — BP 117/75 | HR 106 | Temp 98.2°F | Resp 16 | Ht 78.0 in | Wt 138.0 lb

## 2014-01-28 DIAGNOSIS — D638 Anemia in other chronic diseases classified elsewhere: Secondary | ICD-10-CM

## 2014-01-28 DIAGNOSIS — E43 Unspecified severe protein-calorie malnutrition: Secondary | ICD-10-CM

## 2014-01-28 DIAGNOSIS — Z510 Encounter for antineoplastic radiation therapy: Secondary | ICD-10-CM | POA: Diagnosis not present

## 2014-01-28 DIAGNOSIS — F17220 Nicotine dependence, chewing tobacco, uncomplicated: Secondary | ICD-10-CM | POA: Insufficient documentation

## 2014-01-28 DIAGNOSIS — C20 Malignant neoplasm of rectum: Secondary | ICD-10-CM

## 2014-01-28 DIAGNOSIS — K6289 Other specified diseases of anus and rectum: Secondary | ICD-10-CM

## 2014-01-28 DIAGNOSIS — R198 Other specified symptoms and signs involving the digestive system and abdomen: Secondary | ICD-10-CM

## 2014-01-28 DIAGNOSIS — Z933 Colostomy status: Secondary | ICD-10-CM

## 2014-01-28 DIAGNOSIS — F17200 Nicotine dependence, unspecified, uncomplicated: Secondary | ICD-10-CM

## 2014-01-28 DIAGNOSIS — R5383 Other fatigue: Secondary | ICD-10-CM

## 2014-01-28 DIAGNOSIS — R102 Pelvic and perineal pain: Secondary | ICD-10-CM | POA: Insufficient documentation

## 2014-01-28 DIAGNOSIS — N509 Disorder of male genital organs, unspecified: Secondary | ICD-10-CM

## 2014-01-28 DIAGNOSIS — R5381 Other malaise: Secondary | ICD-10-CM | POA: Insufficient documentation

## 2014-01-28 NOTE — Progress Notes (Signed)
Subjective:    Patient ID: Grant [name], male    DOB: 1956/01/08, 58 y.o.   MRN: [id number]  HPI Patient is in the office to establish care.  Patient appears fatigued and cachetic. He is ambulating with an assistive device. He reports that he is undergoing treatment for rectal cancer. He states that he has completed oral Xeloda and his last radiation session is on 01/29/2014.   Patient is complaining of fatigue. Symptoms began shortly after starting radiations. Symptoms of his fatigue have been change in appetite, diffuse soft tissue aches and pains and general malaise. Patient denies fever, unusual rashes and GI blood loss. Symptoms have worsened, beginning a few months ago. Severity has been struggles to carry out day to day responsibilities..    Grant [name] is also complaining of a poor appetite. He states that he does not enjoy food and it is difficult to complete a meal. He states that his tastes have changed and spices are potent. He states that he has added boost to diet to add protein. He also states that he has increased water intake.    Review of Systems  Constitutional: Positive for fatigue and unexpected weight change (weight loss). Negative for fever.  HENT: Negative.   Eyes: Negative.   Respiratory: Negative.   Cardiovascular: Negative.   Endocrine: Negative.  Negative for cold intolerance and heat intolerance.  Genitourinary: Negative.   Musculoskeletal: Negative.   Skin: Positive for wound.       Radiation burns to rectum  Allergic/Immunologic: Positive for food allergies and immunocompromised state. Negative for environmental allergies.  Neurological: Negative.   Hematological: Negative.   Psychiatric/Behavioral: The patient is nervous/anxious (Anxiety controlled on Xanax).        Objective:   Physical Exam  Constitutional: He is oriented to person, place, and time. He appears well-developed and well-nourished.  HENT:  Head: Normocephalic and atraumatic.    Right Ear: External ear normal.  Left Ear: External ear normal.  Mouth/Throat: Oropharynx is clear and moist.  Eyes: Conjunctivae and EOM are normal. Pupils are equal, round, and reactive to light.  Neck: Normal range of motion. Neck supple.  Cardiovascular: Normal rate, regular rhythm, normal heart sounds and intact distal pulses.   Pulmonary/Chest: Effort normal and breath sounds normal.  Abdominal: Soft. Bowel sounds are normal.    Musculoskeletal: Normal range of motion.  Neurological: He is alert and oriented to person, place, and time. He has normal reflexes.  Skin: Skin is warm and dry. Abrasion and burn noted. There is erythema.     Psychiatric: He has a normal mood and affect. His behavior is normal. Judgment and thought content normal.         BP 117/75  Pulse 106  Temp(Src) 98.2 F (36.8 C)  Resp 16  Ht 6\' 6"  (1.981 m)  Wt 138 lb (62.596 kg)  BMI 15.95 kg/m2 Assessment & Plan:   1. Anemia of chronic disease Stable on ferrous sulfate  2. Severe protein-calorie malnutrition Anorexia and weight loss secondary to Patient states that he adds Boost supplement  3 CA of rectum Reviewed previous labs and images. Patient completed oral Xeloda. He is currently undergoing radiation therapy. He states that he is scheduled to have an MRI with [facility]. He states that they will discuss future treatment plans.   4. Other malaise and fatigue  He is conserving steps and taking frequent naps. Reviewed labs from 01/27/2014.  5. Pain in male perineum Skin toxicity perineum related to radiation  and Xeloda. Patient is applying A&D ointment.   7. 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   8. Colostomy in place Stoma appears healthy. Recommend that patient continues to take stool softeners   RTC: CPE with Dr. Zigmund Daniel. Patient to schedule appt after follow up visit with [facility]: Lipid (1 week  prior to to CPE) Vaccinations: Patient will need tetanus, pneumovax and influenza  following chemo/XRT   Colonoscopy: 09/2013  Dorena Dew, FNP

## 2014-01-29 ENCOUNTER — Encounter: Payer: Self-pay | Admitting: Radiation Oncology

## 2014-01-29 ENCOUNTER — Ambulatory Visit: Payer: Medicaid Other

## 2014-01-29 ENCOUNTER — Ambulatory Visit
Admission: RE | Admit: 2014-01-29 | Discharge: 2014-01-29 | Disposition: A | Discharge status: Home / Self Care | Payer: Medicaid Other | Source: Ambulatory Visit | Attending: Radiation Oncology | Admitting: Radiation Oncology

## 2014-01-29 DIAGNOSIS — Z510 Encounter for antineoplastic radiation therapy: Secondary | ICD-10-CM | POA: Diagnosis not present

## 2014-01-30 ENCOUNTER — Ambulatory Visit: Payer: Medicaid Other

## 2014-02-10 ENCOUNTER — Other Ambulatory Visit (HOSPITAL_BASED_OUTPATIENT_CLINIC_OR_DEPARTMENT_OTHER): Payer: Self-pay

## 2014-02-10 DIAGNOSIS — C20 Malignant neoplasm of rectum: Secondary | ICD-10-CM

## 2014-02-10 LAB — CBC WITH DIFFERENTIAL/PLATELET
BASO%: 0.5 % (ref 0.0–2.0)
BASOS ABS: 0 10*3/uL (ref 0.0–0.1)
EOS ABS: 0 10*3/uL (ref 0.0–0.5)
EOS%: 1 % (ref 0.0–7.0)
HCT: 26.6 % — ABNORMAL LOW (ref 38.4–49.9)
HEMOGLOBIN: 8.6 g/dL — AB (ref 13.0–17.1)
LYMPH%: 3.4 % — AB (ref 14.0–49.0)
MCH: 28 pg (ref 27.2–33.4)
MCHC: 32.4 g/dL (ref 32.0–36.0)
MCV: 86.4 fL (ref 79.3–98.0)
MONO#: 0.3 10*3/uL (ref 0.1–0.9)
MONO%: 7.8 % (ref 0.0–14.0)
NEUT%: 87.3 % — ABNORMAL HIGH (ref 39.0–75.0)
NEUTROS ABS: 3.6 10*3/uL (ref 1.5–6.5)
PLATELETS: 347 10*3/uL (ref 140–400)
RBC: 3.08 10*6/uL — ABNORMAL LOW (ref 4.20–5.82)
RDW: 26.3 % — AB (ref 11.0–14.6)
WBC: 4.1 10*3/uL (ref 4.0–10.3)
lymph#: 0.1 10*3/uL — ABNORMAL LOW (ref 0.9–3.3)

## 2014-02-14 NOTE — Progress Notes (Signed)
  Radiation Oncology         [phone] ________________________________  Name: Grant [name] MRN: [id number]  Date: 01/29/2014  DOB: [birth year]  End of Treatment Note  Diagnosis:   Rectal cancer     Indication for treatment:  curative       Radiation treatment dates:   12/18/2013 through 01/29/2014  Site/dose:    The patient was treated to the pelvis including the gross tumor volume and at risk nodal volumes to a dose of 45 Gy at 1.8 Gy per fraction. This was accomplished using a 4 field 3-D conformal technique. The patient then received a boost to the tumor and adjacent high-risk regions for an additional 9 Gy at 1.8 gray per fraction. This was carried out using a coned-down 4 field approach. The patient's total dose was 54 Gy.  Narrative: The patient tolerated radiation treatment relatively well.   I was very pleased that the patient was able to complete his prescribed course of radiation without significant delays or unexpected difficulty. No major issues in terms of GI toxicity during treatment.  Plan: The patient has completed radiation treatment. The patient will return to radiation oncology clinic for routine followup in one month. I advised the patient to call or return sooner if they have any questions or concerns related to their recovery or treatment. ________________________________  Jodelle Gross, M.D., Ph.D.

## 2014-02-20 ENCOUNTER — Other Ambulatory Visit: Payer: Self-pay | Admitting: *Deleted

## 2014-02-20 DIAGNOSIS — C20 Malignant neoplasm of rectum: Secondary | ICD-10-CM

## 2014-02-20 MED ORDER — OXYCODONE HCL 5 MG PO TABS: 5.0000 mg | ORAL_TABLET | ORAL | Status: DC | PRN

## 2014-02-20 NOTE — Telephone Encounter (Signed)
Message from [place] at [phone] and [phone] requesting refill on Oxycodone. Returned call, Rx will be left in prescription book for pick up.

## 2014-02-28 ENCOUNTER — Telehealth: Payer: Self-pay | Admitting: *Deleted

## 2014-02-28 NOTE — Telephone Encounter (Signed)
VM from [facility] to cancel his 10/19 lab/office visit reporting [facility] is doing more testing there. Dr. Benay Spice notified.

## 2014-03-03 ENCOUNTER — Other Ambulatory Visit: Payer: Self-pay

## 2014-03-03 ENCOUNTER — Ambulatory Visit: Payer: Self-pay | Admitting: Oncology

## 2014-03-04 ENCOUNTER — Telehealth: Payer: Self-pay | Admitting: *Deleted

## 2014-03-04 NOTE — Telephone Encounter (Signed)
Gave patient appointment for 10/21 at 11:45 with NP/MD. He understands and agrees.

## 2014-03-05 ENCOUNTER — Ambulatory Visit (HOSPITAL_BASED_OUTPATIENT_CLINIC_OR_DEPARTMENT_OTHER): Payer: Self-pay | Admitting: Nurse Practitioner

## 2014-03-05 ENCOUNTER — Telehealth: Payer: Self-pay | Admitting: *Deleted

## 2014-03-05 VITALS — BP 88/56 | HR 90 | Temp 98.6°F | Resp 18 | Ht 78.0 in | Wt 140.3 lb

## 2014-03-05 DIAGNOSIS — C20 Malignant neoplasm of rectum: Secondary | ICD-10-CM

## 2014-03-05 NOTE — Progress Notes (Addendum)
[place] OFFICE PROGRESS NOTE   Diagnosis:  Rectal cancer  INTERVAL HISTORY:   Mr. Vernon returns as scheduled. He completed the course of radiation and Xeloda on 01/29/2014. Rectal pain is unchanged. He takes oxycodone 10 mg every 4 hours. He denies nausea/vomiting. No mouth sores. No hand or foot pain or redness. He reports good output from the colostomy. He describes his appetite as "fair". His wife feels it has improved. He is gaining some weight. He denies shortness of breath. He has a poor energy level. No rectal bleeding. He continues to have "drainage" from the rectum. He continues to note stool in his urine.  Objective:  Vital signs in last 24 hours:  Blood pressure 88/56, pulse 90, temperature 98.6 F (37 C), temperature source Oral, resp. rate 18, height 6\' 6"  (1.981 m), weight 140 lb 4.8 oz (63.64 kg).    HEENT: No thrush or ulcers Resp: Lungs clear bilaterally Cardio: Regular rate and rhythm GI: Soft. No hepatomegaly. Tender at the low pelvic region. Vascular: No leg edema. Skin: Palms without erythema.    Lab Results:  Lab Results  Component Value Date   WBC 4.1 02/10/2014   HGB 8.6* 02/10/2014   HCT 26.6* 02/10/2014   MCV 86.4 02/10/2014   PLT 347 02/10/2014   NEUTROABS 3.6 02/10/2014    Imaging:  No results found.  Medications: I have reviewed the patient's current medications.  Assessment/Plan: 1. Rectal cancer-large pelvic mass with a colonoscopy 10/11/2013 confirming an obstructing tumor at 9 cm from the anal verge CTs of the chest, abdomen, and revealed a large complex pelvic mass, gas in the bladder, loculated gas posterior to the bladder, right hydronephrosis, and small bilateral indeterminant lung nodules.  Initiation of radiation and Xeloda 12/18/2013; completed 01/29/2014. Restaging CT evaluation at [facility] showed decrease in size of the rectosigmoid mass from 14.6 x 10.5 cm to 9.5 x 8.6 cm. Post radiation changes. Moderate right  hydronephrosis with a double-J ureteral stent in place and mild left hydronephrosis, unchanged. Pulmonary nodules unchanged. Circumferential bladder wall thickening likely secondary to radiation. Rectosigmoid mass abuts the posterior bladder and involvement cannot be excluded. 2. Right hydronephrosis secondary to the obstructing pelvic mass, status post placement of a right percutaneous nephrostomy tube followed by a right ureter stent. 3. Microcytic anemia-likely iron deficiency anemia secondary to bleeding from the rectal tumor-now taking iron.  4. Possible colovesical fistula-on the CT 10/09/2013 evaluated by Dr. Alinda Money, followup CT cystogram 10/18/2013 revealed no fistula.  5. Anorexia/weight loss secondary to #1. 6. Indeterminate lung nodules on a CT of the chest 10/09/2013. Stable on CT 03/03/2014. 7. Family history of pancreas and colon cancer. 8. 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 10. Hospitalization 12/10/2013 through 12/12/2013 presenting with diarrhea. CT abdomen/pelvis showed an enlarging rectal mass filling almost the entire pelvis. Air noted in the bladder and renal collecting system. Linear collection of air noted along the anterior wall of the colorectal mass abutting the bladder felt to possibly represent a site of fistulization. Stool noted throughout the right colon. Transferred to [facility]. Neoadjuvant chemotherapy/radiation recommended. 11. Skin toxicity perineum related to radiation and Xeloda.   Disposition: Mr. Gomillion appears unchanged. Dr. Cecil Cobbs, surgical oncology at [facility], has spoken with Dr. Benay Spice and recommends neoadjuvant chemotherapy prior to surgery in the hope of further tumor shrinkage.  Dr. Benay Spice recommends an oxaliplatin-based regimen, FOLFOX or CAPOX. We reviewed potential toxicities associated with oxaliplatin including myelosuppression, nausea, allergic reaction, neurotoxicity including cold sensitivity, peripheral neuropathy, acute  laryngeal pharyngeal dysesthesia and more rare occurrences such as diplopia, bowel/bladder dysfunction, ataxia. We also reviewed potential toxicities associated with 5-fluorouracil including mouth sores, nausea, diarrhea, skin rash, conjunctivitis, hand-foot syndrome, skin hyperpigmentation.  Mr. Rhett prefers the CAPOX regimen as it is given every 3 weeks and does not require a pump.   He understands he will need a Port-A-Cath. We made a referral to interventional radiology.  We scheduled him for the first cycle on 03/13/2014. He will return for a followup visit and cycle 2 on 04/03/2014. He will contact the office in the interim with any problems.  Patient seen with Dr. Benay Spice. 25 minutes were spent face-to-face at today's visit with the majority of the time involved in counseling/coordination of care.   Ned Card ANP/GNP-BC   03/05/2014  12:37 PM  This was a shared visit with Ned Card. Mr. Schoenfeld was interviewed and examined.  He was seen at [facility] 03/03/2014. A CT confirmed a decrease in the pelvic mass. I discussed the case with Dr. Randon Goldsmith. She reports resection of the pelvic mass will be associated with significant morbidity unless the tumor shrinks further. She recommends oxaliplatin-based chemotherapy.  We discussed CAPOX and FOLFOX with Mr. Boettner. He prefers the CAPOX regimen. We reviewed the potential toxicities associated with this regimen and he agrees to proceed. He will be referred for placement of a Port-A-Cath and is scheduled for a first cycle of CAPOX beginning 03/13/2014.   Julieanne Manson, M.D.

## 2014-03-05 NOTE — Telephone Encounter (Signed)
Per staff message and POF I have scheduled appts. Advised scheduler of appts. JMW  

## 2014-03-06 ENCOUNTER — Encounter (HOSPITAL_COMMUNITY): Payer: Self-pay | Admitting: Pharmacy Technician

## 2014-03-06 ENCOUNTER — Telehealth: Payer: Self-pay | Admitting: *Deleted

## 2014-03-06 NOTE — Telephone Encounter (Signed)
Left Message for patient to call back to follow up on low blood pressure at yesterday's appointment 03/05/14.  Per Ned Card, NP.

## 2014-03-07 ENCOUNTER — Other Ambulatory Visit: Payer: Self-pay | Admitting: Radiology

## 2014-03-11 ENCOUNTER — Other Ambulatory Visit: Payer: Self-pay | Admitting: Radiology

## 2014-03-12 ENCOUNTER — Encounter (HOSPITAL_COMMUNITY): Payer: Self-pay

## 2014-03-12 ENCOUNTER — Other Ambulatory Visit: Payer: Self-pay | Admitting: Oncology

## 2014-03-12 ENCOUNTER — Ambulatory Visit (HOSPITAL_COMMUNITY)
Admission: RE | Admit: 2014-03-12 | Discharge: 2014-03-12 | Disposition: A | Discharge status: Home / Self Care | Payer: Medicaid Other | Source: Ambulatory Visit | Attending: Nurse Practitioner | Admitting: Nurse Practitioner

## 2014-03-12 ENCOUNTER — Other Ambulatory Visit: Payer: Self-pay | Admitting: Nurse Practitioner

## 2014-03-12 ENCOUNTER — Other Ambulatory Visit: Payer: Self-pay | Admitting: *Deleted

## 2014-03-12 DIAGNOSIS — C20 Malignant neoplasm of rectum: Secondary | ICD-10-CM

## 2014-03-12 DIAGNOSIS — Z452 Encounter for adjustment and management of vascular access device: Secondary | ICD-10-CM | POA: Diagnosis not present

## 2014-03-12 DIAGNOSIS — Z79899 Other long term (current) drug therapy: Secondary | ICD-10-CM | POA: Diagnosis not present

## 2014-03-12 DIAGNOSIS — Z87891 Personal history of nicotine dependence: Secondary | ICD-10-CM | POA: Diagnosis not present

## 2014-03-12 LAB — BASIC METABOLIC PANEL
ANION GAP: 10 (ref 5–15)
BUN: 17 mg/dL (ref 6–23)
CHLORIDE: 92 meq/L — AB (ref 96–112)
CO2: 29 meq/L (ref 19–32)
Calcium: 9 mg/dL (ref 8.4–10.5)
Creatinine, Ser: 0.95 mg/dL (ref 0.50–1.35)
GFR calc Af Amer: >90 mL/min (ref >90)
GFR calc non Af Amer: >90 mL/min (ref >90)
GLUCOSE: 81 mg/dL (ref 70–99)
Potassium: 4.4 mEq/L (ref 3.7–5.3)
Sodium: 131 mEq/L — ABNORMAL LOW (ref 137–147)

## 2014-03-12 LAB — CBC WITH DIFFERENTIAL/PLATELET
Basophils Absolute: 0 10*3/uL (ref 0.0–0.1)
Basophils Relative: 0 % (ref 0–1)
Eosinophils Absolute: 0 10*3/uL (ref 0.0–0.7)
Eosinophils Relative: 0 % (ref 0–5)
HEMATOCRIT: 22.2 % — AB (ref 39.0–52.0)
HEMOGLOBIN: 7 g/dL — AB (ref 13.0–17.0)
LYMPHS PCT: 5 % — AB (ref 12–46)
Lymphs Abs: 0.2 10*3/uL — ABNORMAL LOW (ref 0.7–4.0)
MCH: 26.4 pg (ref 26.0–34.0)
MCHC: 31.5 g/dL (ref 30.0–36.0)
MCV: 83.8 fL (ref 78.0–100.0)
MONO ABS: 0.3 10*3/uL (ref 0.1–1.0)
Monocytes Relative: 7 % (ref 3–12)
NEUTROS ABS: 4.4 10*3/uL (ref 1.7–7.7)
Neutrophils Relative %: 88 % — ABNORMAL HIGH (ref 43–77)
Platelets: 340 10*3/uL (ref 150–400)
RBC: 2.65 MIL/uL — ABNORMAL LOW (ref 4.22–5.81)
RDW: 18.3 % — ABNORMAL HIGH (ref 11.5–15.5)
WBC: 5 10*3/uL (ref 4.0–10.5)

## 2014-03-12 LAB — APTT: APTT: 37 s (ref 24–37)

## 2014-03-12 LAB — PROTIME-INR
INR: 1.13 (ref 0.00–1.49)
Prothrombin Time: 14.6 seconds (ref 11.6–15.2)

## 2014-03-12 MED ORDER — MIDAZOLAM HCL 2 MG/2ML IJ SOLN
INTRAMUSCULAR | Status: AC | PRN
  Administered 2014-03-12: 2 mg via INTRAVENOUS

## 2014-03-12 MED ORDER — SODIUM CHLORIDE 0.9 % IV SOLN
INTRAVENOUS | Status: DC
  Administered 2014-03-12: 13:00:00 via INTRAVENOUS

## 2014-03-12 MED ORDER — LIDOCAINE-PRILOCAINE 2.5-2.5 % EX CREA: 1.0000 "application " | TOPICAL_CREAM | CUTANEOUS | Status: DC | PRN

## 2014-03-12 MED ORDER — LIDOCAINE HCL 1 % IJ SOLN
INTRAMUSCULAR | Status: AC
  Filled 2014-03-12: qty 20

## 2014-03-12 MED ORDER — MIDAZOLAM HCL 2 MG/2ML IJ SOLN
INTRAMUSCULAR | Status: AC
  Filled 2014-03-12: qty 4

## 2014-03-12 MED ORDER — FENTANYL CITRATE 0.05 MG/ML IJ SOLN
INTRAMUSCULAR | Status: AC
  Filled 2014-03-12: qty 4

## 2014-03-12 MED ORDER — HEPARIN SOD (PORK) LOCK FLUSH 100 UNIT/ML IV SOLN: 500.0000 [IU] | Freq: Once | INTRAVENOUS | Status: DC

## 2014-03-12 MED ORDER — CEFAZOLIN SODIUM-DEXTROSE 2-3 GM-% IV SOLR
2.0000 g | INTRAVENOUS | Status: AC
  Administered 2014-03-12: 2 g via INTRAVENOUS

## 2014-03-12 MED ORDER — PROCHLORPERAZINE MALEATE 10 MG PO TABS: 10.0000 mg | ORAL_TABLET | Freq: Four times a day (QID) | ORAL | Status: DC | PRN

## 2014-03-12 MED ORDER — HEPARIN SOD (PORK) LOCK FLUSH 100 UNIT/ML IV SOLN
INTRAVENOUS | Status: AC
  Filled 2014-03-12: qty 5

## 2014-03-12 MED ORDER — CEFAZOLIN SODIUM-DEXTROSE 2-3 GM-% IV SOLR
INTRAVENOUS | Status: AC
  Filled 2014-03-12: qty 50

## 2014-03-12 MED ORDER — FENTANYL CITRATE 0.05 MG/ML IJ SOLN
INTRAMUSCULAR | Status: AC | PRN
  Administered 2014-03-12 (×2): 50 ug via INTRAVENOUS

## 2014-03-12 NOTE — H&P (Signed)
Patient seen.  For port placement today.

## 2014-03-12 NOTE — Procedures (Signed)
Procedure:  Porta-cath insertion Access:  Right IJ vein Findings:  SL Power Port with tip at cavoatrial junction. No PTX.  OK to use.

## 2014-03-12 NOTE — Progress Notes (Signed)
Requested pain medication for patient from [facility]. Stated he thinks patient appeared comfortable at present prior to sedation for procedure.

## 2014-03-12 NOTE — Progress Notes (Signed)
Pt has first treatment scheduled for tomorrow via new PAC.  No EMLA cream ordered, PAC not left accessed.  Called Dr. Gearldine Shown office, spoke with Manuela Schwartz, RN, who will escribe a prescription for pt to [facility].  Coolidge Breeze, RN 03/12/2014

## 2014-03-12 NOTE — H&P (Signed)
Chief Complaint: "I'm here to get a port a cath"  Referring Physician(s): Dr. Benay Spice  History of Present Illness: Grant [name] is a 58 y.o. male with history of rectal cancer who presents today for port a cath placement for chemotherapy.  Past Medical History  Diagnosis Date  . Fecal incontinence   . 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 Cancer 10/11/13    rectal cancer-adenocarcinoma  . Chronic kidney disease     right stent    Past Surgical History  Procedure Laterality Date  . Mandible surgery  1983  . Colonoscopy N/A 10/11/2013    Procedure: COLONOSCOPY;  Surgeon: [name], MD;  Location: [facility];  Service: Endoscopy;  Laterality: N/A;  . Colon resection N/A 10/15/2013    Procedure: LAP ASSISTED  LOOP COLOSTOMY;  Surgeon: Harl Bowie, MD;  Location: [place];  Service: General;  Laterality: N/A;  . Nephrostomy tube Right 10/18/13    with stent ,tube removed    Allergies: Strawberry; Sunflower oil; and Watermelon  Medications: Prior to Admission medications   Medication Sig Start Date End Date Taking? Authorizing Provider  ALPRAZolam Duanne Moron) 0.5 MG tablet Take 1 tablet (0.5 mg total) by mouth 2 (two) times daily as needed for anxiety. 12/03/13  Yes Owens Shark, NP  Bisacodyl (DULCOLAX PO) Take 1 tablet by mouth 2 (two) times daily as needed (constipation/laxative.).   Yes Historical Provider, MD  ferrous sulfate 325 (65 FE) MG tablet Take 1 tablet (325 mg total) by mouth 2 (two) times daily with a meal. 10/20/13  Yes Nishant Dhungel, MD  lactose free nutrition (BOOST PLUS) LIQD Take 237 mLs by mouth. From four to eight times daily.   Yes Historical Provider, MD  Multiple Vitamins-Minerals ([employer] SPECTRAVITE ULTRA MENS) TABS Take 1 tablet by mouth every morning.  10/20/13  Yes Historical Provider, MD  Oxycodone HCl 10 MG TABS Take 5-10 mg by mouth every 4 (four) hours as needed (pain.).   Yes Historical Provider, MD  polyethylene glycol  (MIRALAX / GLYCOLAX) packet Take 17 g by mouth daily as needed for mild constipation.   Yes Historical Provider, MD  spironolactone (ALDACTONE) 25 MG tablet Take 25 mg by mouth every morning.  01/02/14 01/02/15 Yes Historical Provider, MD    Family History  Problem Relation Age of Onset  . Cancer Father     pancreatic   . Cancer Maternal Uncle     History   Social History  . Marital Status: Single    Spouse Name: N/A    Number of Children: N/A  . Years of Education: N/A   Social History Main Topics  . Smoking status: Former Smoker    Quit date: 05/16/1988  . Smokeless tobacco: Current User    Types: Snuff  . Alcohol Use: Yes     Comment: OCCASIONAL   . Drug Use: Yes    Special: Marijuana  . Sexual Activity: None   Other Topics Concern  . None   Social History Narrative  . None         Review of Systems   Constitutional: Positive for fatigue and unexpected weight change. Negative for fever and chills.  Respiratory: Negative for cough and shortness of breath.   Cardiovascular: Negative for chest pain.  Gastrointestinal: Positive for abdominal pain and rectal pain. Negative for nausea and vomiting.       Hx stool in urine (colovesical fistula)  Genitourinary: Negative for  hematuria.  Musculoskeletal: Positive for back pain.  Skin: Positive for pallor.  Neurological:       Occ HA's  Hematological: Does not bruise/bleed easily.    Vital Signs: BP 103/66  Pulse 87  Temp(Src) 98.9 F (37.2 C) (Oral)  Resp 20  SpO2 100%  Physical Exam  Constitutional: He is oriented to person, place, and time.  Cachectic appearing WM in NAD  Cardiovascular: Normal rate and regular rhythm.   Pulmonary/Chest: Effort normal.  Distant BS bilat  Abdominal: Soft. Bowel sounds are normal. There is tenderness.  LLQ colostomy intact  Musculoskeletal: Normal range of motion. He exhibits no edema.  Neurological: He is alert and oriented to person, place, and time.     Imaging: No results found.  Labs:  CBC:  Recent Labs  01/10/14 1424 01/27/14 1455 02/10/14 1500 03/12/14 1310  WBC 3.9* 4.4 4.1 5.0  HGB 9.4* 8.5* 8.6* 7.0*  HCT 29.6* 26.3* 26.6* 22.2*  PLT 341 331 347 340    COAGS:  Recent Labs  10/09/13 1331 10/09/13 2138 03/12/14 1310  INR 1.19 1.34 1.13  APTT  --  34 37    BMP:  Recent Labs  12/10/13 1838 12/10/13 2244 12/11/13 0641 12/12/13 0413 12/25/13 1359 01/10/14 1424  NA 128* 128* 132* 134* 131* 130*  K 4.0 4.2 3.9 3.6* 4.6 4.7  CL 89* 90* 93* 99 92*  --   CO2 23 24 24 23 27 27   GLUCOSE 166* 147* 117* 166* 129* 182*  BUN 18 16 14 11 16  17.3  CALCIUM 9.5 8.7 9.1 8.4 9.2 9.6  CREATININE 1.01 0.95 0.91 0.90 1.05 1.0  GFRNONAA 81* >90 >90 >90  --   --   GFRAA >90 >90 >90 >90  --   --     LIVER FUNCTION TESTS:  Recent Labs  12/11/13 0641 12/12/13 0413 12/25/13 1359 01/10/14 1424  BILITOT 0.3 0.3 0.2* 0.66  AST 10 9 11 10   ALT 6 <5 <5 7  ALKPHOS 97 82 59 83  PROT 7.0 6.0 6.9 8.0  ALBUMIN 2.2* 1.9* 2.2* 2.8*    TUMOR MARKERS:  Recent Labs  10/09/13 2138 10/10/13 0345  AFPTM 1.7  --   CEA  --  3.7  CA199 10.3*  --     Assessment and Plan: Grant [name] is a 58 y.o. male with history of rectal cancer who presents today for port a cath placement for chemotherapy. Details/risks of procedure d/w pt with his understanding and consent. Current hgb 7.0- Dr. Kathlene Cote aware.          Signed: Autumn Messing 03/12/2014, 1:53 PM

## 2014-03-13 ENCOUNTER — Ambulatory Visit: Payer: MEDICAID | Admitting: Radiation Oncology

## 2014-03-13 ENCOUNTER — Ambulatory Visit (HOSPITAL_COMMUNITY)
Admission: RE | Admit: 2014-03-13 | Discharge: 2014-03-13 | Disposition: A | Discharge status: Home / Self Care | Payer: Medicaid Other | Source: Ambulatory Visit | Attending: Oncology | Admitting: Oncology

## 2014-03-13 ENCOUNTER — Other Ambulatory Visit: Payer: Self-pay | Admitting: *Deleted

## 2014-03-13 ENCOUNTER — Ambulatory Visit: Payer: Self-pay | Admitting: Nutrition

## 2014-03-13 ENCOUNTER — Other Ambulatory Visit (HOSPITAL_BASED_OUTPATIENT_CLINIC_OR_DEPARTMENT_OTHER): Payer: Self-pay

## 2014-03-13 ENCOUNTER — Ambulatory Visit (HOSPITAL_BASED_OUTPATIENT_CLINIC_OR_DEPARTMENT_OTHER): Payer: Self-pay

## 2014-03-13 DIAGNOSIS — C20 Malignant neoplasm of rectum: Secondary | ICD-10-CM

## 2014-03-13 DIAGNOSIS — Z5111 Encounter for antineoplastic chemotherapy: Secondary | ICD-10-CM

## 2014-03-13 DIAGNOSIS — D649 Anemia, unspecified: Secondary | ICD-10-CM | POA: Insufficient documentation

## 2014-03-13 LAB — COMPREHENSIVE METABOLIC PANEL (CC13)
ALT: 7 U/L (ref 0–55)
ANION GAP: 6 meq/L (ref 3–11)
AST: 7 U/L (ref 5–34)
Albumin: 2.2 g/dL — ABNORMAL LOW (ref 3.5–5.0)
Alkaline Phosphatase: 63 U/L (ref 40–150)
BUN: 17.1 mg/dL (ref 7.0–26.0)
CALCIUM: 9 mg/dL (ref 8.4–10.4)
CO2: 30 meq/L — AB (ref 22–29)
CREATININE: 0.9 mg/dL (ref 0.7–1.3)
Chloride: 98 mEq/L (ref 98–109)
Glucose: 123 mg/dl (ref 70–140)
Potassium: 4.6 mEq/L (ref 3.5–5.1)
Sodium: 134 mEq/L — ABNORMAL LOW (ref 136–145)
TOTAL PROTEIN: 6.6 g/dL (ref 6.4–8.3)
Total Bilirubin: <0.2 mg/dL (ref 0.20–1.20)

## 2014-03-13 LAB — CBC WITH DIFFERENTIAL/PLATELET
BASO%: 0.5 % (ref 0.0–2.0)
BASOS ABS: 0 10*3/uL (ref 0.0–0.1)
EOS ABS: 0.1 10*3/uL (ref 0.0–0.5)
EOS%: 1.2 % (ref 0.0–7.0)
HEMATOCRIT: 22.3 % — AB (ref 38.4–49.9)
HEMOGLOBIN: 6.8 g/dL — AB (ref 13.0–17.1)
LYMPH%: 5.2 % — AB (ref 14.0–49.0)
MCH: 25.5 pg — ABNORMAL LOW (ref 27.2–33.4)
MCHC: 30.5 g/dL — ABNORMAL LOW (ref 32.0–36.0)
MCV: 83.5 fL (ref 79.3–98.0)
MONO#: 0.3 10*3/uL (ref 0.1–0.9)
MONO%: 6.7 % (ref 0.0–14.0)
NEUT#: 3.6 10*3/uL (ref 1.5–6.5)
NEUT%: 86.4 % — ABNORMAL HIGH (ref 39.0–75.0)
Platelets: 345 10*3/uL (ref 140–400)
RBC: 2.67 10*6/uL — AB (ref 4.20–5.82)
RDW: 18.6 % — ABNORMAL HIGH (ref 11.0–14.6)
WBC: 4.2 10*3/uL (ref 4.0–10.3)
lymph#: 0.2 10*3/uL — ABNORMAL LOW (ref 0.9–3.3)
nRBC: 0 % (ref 0–0)

## 2014-03-13 LAB — HOLD TUBE, BLOOD BANK

## 2014-03-13 LAB — PREPARE RBC (CROSSMATCH)

## 2014-03-13 MED ORDER — DEXTROSE 5 % IV SOLN
Freq: Once | INTRAVENOUS | Status: AC
  Administered 2014-03-13: 13:00:00 via INTRAVENOUS

## 2014-03-13 MED ORDER — DEXAMETHASONE SODIUM PHOSPHATE 10 MG/ML IJ SOLN
10.0000 mg | Freq: Once | INTRAMUSCULAR | Status: AC
  Administered 2014-03-13: 10 mg via INTRAVENOUS

## 2014-03-13 MED ORDER — SODIUM CHLORIDE 0.9 % IJ SOLN
10.0000 mL | INTRAMUSCULAR | Status: DC | PRN
  Administered 2014-03-13: 10 mL
  Filled 2014-03-13: qty 10

## 2014-03-13 MED ORDER — OXALIPLATIN CHEMO INJECTION 100 MG/20ML
100.0000 mg/m2 | Freq: Once | INTRAVENOUS | Status: AC
  Administered 2014-03-13: 185 mg via INTRAVENOUS
  Filled 2014-03-13: qty 37

## 2014-03-13 MED ORDER — HEPARIN SOD (PORK) LOCK FLUSH 100 UNIT/ML IV SOLN
500.0000 [IU] | Freq: Once | INTRAVENOUS | Status: AC | PRN
  Administered 2014-03-13: 500 [IU]
  Filled 2014-03-13: qty 5

## 2014-03-13 MED ORDER — DEXAMETHASONE SODIUM PHOSPHATE 10 MG/ML IJ SOLN
INTRAMUSCULAR | Status: AC
  Filled 2014-03-13: qty 1

## 2014-03-13 MED ORDER — ONDANSETRON 8 MG/NS 50 ML IVPB
INTRAVENOUS | Status: AC
Start: 2014-03-13 — End: 2014-03-13
  Filled 2014-03-13: qty 8

## 2014-03-13 MED ORDER — ONDANSETRON 8 MG/50ML IVPB (CHCC)
8.0000 mg | Freq: Once | INTRAVENOUS | Status: AC
  Administered 2014-03-13: 8 mg via INTRAVENOUS

## 2014-03-13 NOTE — Progress Notes (Signed)
Patient with rectal cancer status post colostomy, currently undergoing chemotherapy.   Patient states that his appetite is still fair, but improved. He is currently drinking up to 7 Boost Plus drinks daily. His wife also reports that when he is feeling good, she tries to encourage increased intake. His weight dropped to 138 pounds on 9/15, but has increased to 140 pounds 10/21.   They have questions regarding assistance affording supplement.   Nutrition diagnosis: Unintended weight loss, improved.   Intervention: Reviewed strategies to increase calories and protein. We also discussed alternative supplement options, including Carnation Instant Breakfast. Samples provided. Teach back method used.  Provided patient with 1 case of Ensure Plus.   Monitoring, evaluation, goals: Patient will tolerate increased calories and protein to promote healing and weight gain with minimal nutrition side effects.   Next visit: Thursday, November 19

## 2014-03-13 NOTE — Patient Instructions (Signed)
[facility] Discharge Instructions for Patients Receiving Chemotherapy  Today you received the following chemotherapy agents:  oxaliplatin  To help prevent nausea and vomiting after your treatment, we encourage you to take your nausea medication.  Take it as often as prescribed.     If you develop nausea and vomiting that is not controlled by your nausea medication, call the clinic. If it is after clinic hours your family physician or the after hours number for the clinic or go to the Emergency Department.   BELOW ARE SYMPTOMS THAT SHOULD BE REPORTED IMMEDIATELY:  *FEVER GREATER THAN 100.5 F  *CHILLS WITH OR WITHOUT FEVER  NAUSEA AND VOMITING THAT IS NOT CONTROLLED WITH YOUR NAUSEA MEDICATION  *UNUSUAL SHORTNESS OF BREATH  *UNUSUAL BRUISING OR BLEEDING  TENDERNESS IN MOUTH AND THROAT WITH OR WITHOUT PRESENCE OF ULCERS  *URINARY PROBLEMS  *BOWEL PROBLEMS  UNUSUAL RASH Items with * indicate a potential emergency and should be followed up as soon as possible.  Feel free to call the clinic you have any questions or concerns. The clinic phone number is [phone].   I have been informed and understand all the instructions given to me. I know to contact the clinic, my physician, or go to the Emergency Department if any problems should occur. I do not have any questions at this time, but understand that I may call the clinic during office hours   should I have any questions or need assistance in obtaining follow up care.    __________________________________________  _____________  __________ Signature of Patient or Authorized Representative            Date                   Time    __________________________________________ Nurse's Signature     Oxaliplatin Injection What is this medicine? OXALIPLATIN (ox AL i PLA tin) is a chemotherapy drug. It targets fast dividing cells, like cancer cells, and causes these cells to die. This medicine is used to treat  cancers of the colon and rectum, and many other cancers. This medicine may be used for other purposes; ask your health care provider or pharmacist if you have questions. COMMON BRAND NAME(S): Eloxatin What should I tell my health care provider before I take this medicine? They need to know if you have any of these conditions: -kidney disease -an unusual or allergic reaction to oxaliplatin, other chemotherapy, other medicines, foods, dyes, or preservatives -pregnant or trying to get pregnant -breast-feeding How should I use this medicine? This drug is given as an infusion into a vein. It is administered in a hospital or clinic by a specially trained health care professional. Talk to your pediatrician regarding the use of this medicine in children. Special care may be needed. Overdosage: If you think you have taken too much of this medicine contact a poison control center or emergency room at once. NOTE: This medicine is only for you. Do not share this medicine with others. What if I miss a dose? It is important not to miss a dose. Call your doctor or health care professional if you are unable to keep an appointment. What may interact with this medicine? -medicines to increase blood counts like filgrastim, pegfilgrastim, sargramostim -probenecid -some antibiotics like amikacin, gentamicin, neomycin, polymyxin B, streptomycin, tobramycin -zalcitabine Talk to your doctor or health care professional before taking any of these medicines: -acetaminophen -aspirin -ibuprofen -ketoprofen -naproxen This list may not describe all possible interactions. Give your  health care provider a list of all the medicines, herbs, non-prescription drugs, or dietary supplements you use. Also tell them if you smoke, drink alcohol, or use illegal drugs. Some items may interact with your medicine. What should I watch for while using this medicine? Your condition will be monitored carefully while you are receiving  this medicine. You will need important blood work done while you are taking this medicine. This medicine can make you more sensitive to cold. Do not drink cold drinks or use ice. Cover exposed skin before coming in contact with cold temperatures or cold objects. When out in cold weather wear warm clothing and cover your mouth and nose to warm the air that goes into your lungs. Tell your doctor if you get sensitive to the cold. This drug may make you feel generally unwell. This is not uncommon, as chemotherapy can affect healthy cells as well as cancer cells. Report any side effects. Continue your course of treatment even though you feel ill unless your doctor tells you to stop. In some cases, you may be given additional medicines to help with side effects. Follow all directions for their use. Call your doctor or health care professional for advice if you get a fever, chills or sore throat, or other symptoms of a cold or flu. Do not treat yourself. This drug decreases your body's ability to fight infections. Try to avoid being around people who are sick. This medicine may increase your risk to bruise or bleed. Call your doctor or health care professional if you notice any unusual bleeding. Be careful brushing and flossing your teeth or using a toothpick because you may get an infection or bleed more easily. If you have any dental work done, tell your dentist you are receiving this medicine. Avoid taking products that contain aspirin, acetaminophen, ibuprofen, naproxen, or ketoprofen unless instructed by your doctor. These medicines may hide a fever. Do not become pregnant while taking this medicine. Women should inform their doctor if they wish to become pregnant or think they might be pregnant. There is a potential for serious side effects to an unborn child. Talk to your health care professional or pharmacist for more information. Do not breast-feed an infant while taking this medicine. Call your doctor or  health care professional if you get diarrhea. Do not treat yourself. What side effects may I notice from receiving this medicine? Side effects that you should report to your doctor or health care professional as soon as possible: -allergic reactions like skin rash, itching or hives, swelling of the face, lips, or tongue -low blood counts - This drug may decrease the number of white blood cells, red blood cells and platelets. You may be at increased risk for infections and bleeding. -signs of infection - fever or chills, cough, sore throat, pain or difficulty passing urine -signs of decreased platelets or bleeding - bruising, pinpoint red spots on the skin, black, tarry stools, nosebleeds -signs of decreased red blood cells - unusually weak or tired, fainting spells, lightheadedness -breathing problems -chest pain, pressure -cough -diarrhea -jaw tightness -mouth sores -nausea and vomiting -pain, swelling, redness or irritation at the injection site -pain, tingling, numbness in the hands or feet -problems with balance, talking, walking -redness, blistering, peeling or loosening of the skin, including inside the mouth -trouble passing urine or change in the amount of urine Side effects that usually do not require medical attention (report to your doctor or health care professional if they continue or are bothersome): -changes  in vision -constipation -hair loss -loss of appetite -metallic taste in the mouth or changes in taste -stomach pain This list may not describe all possible side effects. Call your doctor for medical advice about side effects. You may report side effects to FDA at 1-800-FDA-1088. Where should I keep my medicine? This drug is given in a hospital or clinic and will not be stored at home. NOTE: This sheet is a summary. It may not cover all possible information. If you have questions about this medicine, talk to your doctor, pharmacist, or health care provider.  2015,  Elsevier/Gold Standard. (2007-11-27 17:22:47)

## 2014-03-13 NOTE — Progress Notes (Signed)
Ok to treatwith Hgb 6.8 per Dr. Benay Spice.  Pt to receive 2 units blood Saturday.

## 2014-03-14 ENCOUNTER — Encounter: Payer: Self-pay | Admitting: Oncology

## 2014-03-14 ENCOUNTER — Telehealth: Payer: Self-pay | Admitting: *Deleted

## 2014-03-14 MED ORDER — CAPECITABINE 500 MG PO TABS: ORAL_TABLET | ORAL | Status: DC

## 2014-03-14 NOTE — Telephone Encounter (Signed)
No questions or concerns are voiced./

## 2014-03-14 NOTE — Progress Notes (Signed)
Checked in new pt with no financial concerns at this time.  Informed pt I will contact her ins to see if Josem Kaufmann is required for chemo and will obtain that if it is and will also contact foundations that offer copay assistance for chemo if needed.  She has my card if she wants to apply for the [facility] and [place] and for any questions or concerns she may have in the future.

## 2014-03-15 ENCOUNTER — Ambulatory Visit (HOSPITAL_BASED_OUTPATIENT_CLINIC_OR_DEPARTMENT_OTHER): Payer: MEDICAID

## 2014-03-15 VITALS — BP 109/71 | HR 64 | Temp 98.3°F | Resp 18

## 2014-03-15 DIAGNOSIS — C20 Malignant neoplasm of rectum: Secondary | ICD-10-CM

## 2014-03-15 DIAGNOSIS — D649 Anemia, unspecified: Secondary | ICD-10-CM | POA: Diagnosis not present

## 2014-03-15 DIAGNOSIS — D509 Iron deficiency anemia, unspecified: Secondary | ICD-10-CM

## 2014-03-15 MED ORDER — HYDROMORPHONE HCL 4 MG/ML IJ SOLN
2.0000 mg | Freq: Once | INTRAMUSCULAR | Status: AC
  Administered 2014-03-15: 2 mg via INTRAVENOUS

## 2014-03-15 MED ORDER — SODIUM CHLORIDE 0.9 % IJ SOLN
10.0000 mL | INTRAMUSCULAR | Status: AC | PRN
  Administered 2014-03-15: 10 mL
  Filled 2014-03-15: qty 10

## 2014-03-15 MED ORDER — HEPARIN SOD (PORK) LOCK FLUSH 100 UNIT/ML IV SOLN
500.0000 [IU] | Freq: Every day | INTRAVENOUS | Status: AC | PRN
  Administered 2014-03-15: 500 [IU]
  Filled 2014-03-15: qty 5

## 2014-03-15 MED ORDER — HYDROMORPHONE HCL 4 MG/ML IJ SOLN
INTRAMUSCULAR | Status: AC
  Filled 2014-03-15: qty 1

## 2014-03-15 MED ORDER — SODIUM CHLORIDE 0.9 % IV SOLN
250.0000 mL | Freq: Once | INTRAVENOUS | Status: AC
  Administered 2014-03-15: 250 mL via INTRAVENOUS

## 2014-03-15 NOTE — Patient Instructions (Signed)

## 2014-03-16 LAB — TYPE AND SCREEN
ABO/RH(D): A POS
Antibody Screen: NEGATIVE
Unit division: 0
Unit division: 0

## 2014-03-17 ENCOUNTER — Encounter: Payer: Self-pay | Admitting: Oncology

## 2014-03-17 ENCOUNTER — Encounter: Payer: Self-pay | Admitting: Radiation Oncology

## 2014-03-17 NOTE — Progress Notes (Signed)
Called DSS to check status of pt's application.  Ms. Carron Brazen stated that a letter was sent out on Aug. 7, 2015 requesting pt's bank statement and proof of SS disability.  I called Ms. Sandi Mealy (case worker) at [phone] to see if pt turned in the requested docs.  I left a vm for her to return my call.

## 2014-03-18 ENCOUNTER — Encounter: Payer: Self-pay | Admitting: Oncology

## 2014-03-18 NOTE — Progress Notes (Signed)
Ms. Grant [name] (case worker) left a vm stating that the pt was denied Medicaid because he was found not disabled.  Pt requested a hearing to appeal on 02/06/14.  She is waiting for a hearing date.  I will reach out to pt to discuss other options.

## 2014-03-20 ENCOUNTER — Ambulatory Visit
Admission: RE | Admit: 2014-03-20 | Discharge: 2014-03-20 | Disposition: A | Discharge status: Home / Self Care | Payer: Medicaid Other | Source: Ambulatory Visit | Attending: Radiation Oncology | Admitting: Radiation Oncology

## 2014-03-20 ENCOUNTER — Other Ambulatory Visit: Payer: Self-pay | Admitting: *Deleted

## 2014-03-20 ENCOUNTER — Encounter: Payer: Self-pay | Admitting: Radiation Oncology

## 2014-03-20 VITALS — BP 109/63 | HR 84 | Temp 98.3°F | Resp 12 | Wt 144.0 lb

## 2014-03-20 DIAGNOSIS — C20 Malignant neoplasm of rectum: Secondary | ICD-10-CM

## 2014-03-20 HISTORY — DX: Personal history of irradiation: Z92.3

## 2014-03-20 MED ORDER — OXYCODONE HCL 10 MG PO TABS: 5.0000 mg | ORAL_TABLET | ORAL | Status: DC | PRN

## 2014-03-20 MED ORDER — ALPRAZOLAM 0.5 MG PO TABS: 0.5000 mg | ORAL_TABLET | Freq: Two times a day (BID) | ORAL | Status: DC | PRN

## 2014-03-20 NOTE — Progress Notes (Signed)
Radiation Oncology         [phone] ________________________________  Name: Grant [name] MRN: [id number]  Date: 03/20/2014  DOB: 10-23-1955  Follow-Up Visit Note  CC: No PCP Per Patient  [name], MD  Diagnosis:   Rectal cancer  Interval Since Last Radiation:  Approximately one month   Narrative:  The patient returns today for routine follow-up.  The patient has been reevaluated at [facility] They recommended further neoadjuvant chemotherapy. He began this last week and he states that this has gone well. He has some fatigue and poor appetite. Overall however he feels reasonably well/stable. He continues to have some rectal discharge and this has felt more like a regular bowel movement recently. Continued stool with urination without any worsening of this issue.                      ALLERGIES:  is allergic to strawberry; sunflower oil; and watermelon.  Meds: Current Outpatient Prescriptions  Medication Sig Dispense Refill  . ALPRAZolam (XANAX) 0.5 MG tablet Take 1 tablet (0.5 mg total) by mouth 2 (two) times daily as needed for anxiety. 60 tablet 0  . Bisacodyl (DULCOLAX PO) Take 1 tablet by mouth 2 (two) times daily as needed (constipation/laxative.).    Marland Kitchen capecitabine (XELODA) 500 MG tablet Takes 2000 mg in am and 1500 mg in pm X 14 days, 7 day rest 98 tablet 0  . ferrous sulfate 325 (65 FE) MG tablet Take 1 tablet (325 mg total) by mouth 2 (two) times daily with a meal. 60 tablet 0  . lactose free nutrition (BOOST PLUS) LIQD Take 237 mLs by mouth. From four to eight times daily.    Marland Kitchen lidocaine-prilocaine (EMLA) cream Apply 1 application topically as needed. Apply to [facility] site 1-2 hours prior to stick and cover with plastic wrap 30 g 11  . Multiple Vitamins-Minerals ([employer] SPECTRAVITE ULTRA MENS) TABS Take 1 tablet by mouth every morning.     . Oxycodone HCl 10 MG TABS Take 0.5-1 tablets (5-10 mg total) by mouth every 4 (four) hours as needed (pain.). 75  tablet 0  . polyethylene glycol (MIRALAX / GLYCOLAX) packet Take 17 g by mouth daily as needed for mild constipation.    . prochlorperazine (COMPAZINE) 10 MG tablet Take 1 tablet (10 mg total) by mouth every 6 (six) hours as needed for nausea. 60 tablet 1  . spironolactone (ALDACTONE) 25 MG tablet Take 25 mg by mouth every morning.      No current facility-administered medications for this encounter.    Physical Findings: The patient is in no acute distress. Patient is alert and oriented.  weight is 144 lb (65.318 kg). His oral temperature is 98.3 F (36.8 C). His blood pressure is 109/63 and his pulse is 84. His respiration is 12 and oxygen saturation is 100%. .      Lab Findings: Lab Results  Component Value Date   WBC 4.2 03/13/2014   HGB 6.8* 03/13/2014   HCT 22.3* 03/13/2014   MCV 83.5 03/13/2014   PLT 345 03/13/2014     Radiographic Findings: Ir Fluoro Guide Cv Line Left  03/12/2014   CLINICAL DATA:  Rectal carcinoma and need for porta cath to begin chemotherapy.  EXAM: IMPLANTED PORT A CATH PLACEMENT WITH ULTRASOUND AND FLUOROSCOPIC GUIDANCE  ANESTHESIA/SEDATION: 2.0 Mg IV Versed; 100 mcg IV Fentanyl  Total Moderate Sedation Time:  30 minutes  Additional Medications: 2 g IV Ancef. As antibiotic  prophylaxis, Ancef was ordered pre-procedure and administered intravenously within one hour of incision.  FLUOROSCOPY TIME:  12 seconds.  PROCEDURE: The procedure, risks, benefits, and alternatives were explained to the patient. Questions regarding the procedure were encouraged and answered. The patient understands and consents to the procedure.  The right neck and chest were prepped with chlorhexidine in a sterile fashion, and a sterile drape was applied covering the operative field. Maximum barrier sterile technique with sterile gowns and gloves were used for the procedure. A time-out procedure was performed. Local anesthesia was provided with 1% lidocaine.  Ultrasound was used to confirm  patency of the right internal jugular vein. After creating a small venotomy incision, a 21 gauge needle was advanced into the right internal jugular vein under direct, real-time ultrasound guidance. Ultrasound image documentation was performed. After securing guidewire access, an 8 Fr dilator was placed. A J-wire was kinked to measure appropriate catheter length.  A subcutaneous port pocket was then created along the upper chest wall utilizing sharp and blunt dissection. Portable cautery was utilized. The pocket was irrigated with sterile saline.  A single lumen power injectable port was chosen for placement. The 8 Fr catheter was tunneled from the port pocket site to the venotomy incision. The port was placed in the pocket. External catheter was trimmed to appropriate length based on guidewire measurement.  At the venotomy, an 8 Fr peel-away sheath was placed over a guidewire. The catheter was then placed through the sheath and the sheath removed. Final catheter positioning was confirmed and documented with a fluoroscopic spot image. The port was accessed with a needle and aspirated and flushed with heparinized saline. The needle was removed.  The venotomy and port pocket incisions were closed with subcutaneous 3-0 Monocryl and subcuticular 4-0 Vicryl. Dermabond was applied to both incisions.  COMPLICATIONS: None  FINDINGS: After catheter placement, the tip lies at the cavoatrial junction. The catheter aspirates normally and is ready for immediate use.  IMPRESSION: Placement of single lumen port a cath via right internal jugular vein. The catheter tip lies at the cavoatrial junction. A power injectable port a cath was placed and is ready for immediate use.   Electronically Signed   By: Aletta Edouard M.D.   On: 03/12/2014 16:41   Ir US Guide Vasc Access Right  03/12/2014   CLINICAL DATA:  Rectal carcinoma and need for porta cath to begin chemotherapy.  EXAM: IMPLANTED PORT A CATH PLACEMENT WITH ULTRASOUND AND  FLUOROSCOPIC GUIDANCE  ANESTHESIA/SEDATION: 2.0 Mg IV Versed; 100 mcg IV Fentanyl  Total Moderate Sedation Time:  30 minutes  Additional Medications: 2 g IV Ancef. As antibiotic prophylaxis, Ancef was ordered pre-procedure and administered intravenously within one hour of incision.  FLUOROSCOPY TIME:  12 seconds.  PROCEDURE: The procedure, risks, benefits, and alternatives were explained to the patient. Questions regarding the procedure were encouraged and answered. The patient understands and consents to the procedure.  The right neck and chest were prepped with chlorhexidine in a sterile fashion, and a sterile drape was applied covering the operative field. Maximum barrier sterile technique with sterile gowns and gloves were used for the procedure. A time-out procedure was performed. Local anesthesia was provided with 1% lidocaine.  Ultrasound was used to confirm patency of the right internal jugular vein. After creating a small venotomy incision, a 21 gauge needle was advanced into the right internal jugular vein under direct, real-time ultrasound guidance. Ultrasound image documentation was performed. After securing guidewire access, an 8 Fr dilator  was placed. A J-wire was kinked to measure appropriate catheter length.  A subcutaneous port pocket was then created along the upper chest wall utilizing sharp and blunt dissection. Portable cautery was utilized. The pocket was irrigated with sterile saline.  A single lumen power injectable port was chosen for placement. The 8 Fr catheter was tunneled from the port pocket site to the venotomy incision. The port was placed in the pocket. External catheter was trimmed to appropriate length based on guidewire measurement.  At the venotomy, an 8 Fr peel-away sheath was placed over a guidewire. The catheter was then placed through the sheath and the sheath removed. Final catheter positioning was confirmed and documented with a fluoroscopic spot image. The port was  accessed with a needle and aspirated and flushed with heparinized saline. The needle was removed.  The venotomy and port pocket incisions were closed with subcutaneous 3-0 Monocryl and subcuticular 4-0 Vicryl. Dermabond was applied to both incisions.  COMPLICATIONS: None  FINDINGS: After catheter placement, the tip lies at the cavoatrial junction. The catheter aspirates normally and is ready for immediate use.  IMPRESSION: Placement of single lumen port a cath via right internal jugular vein. The catheter tip lies at the cavoatrial junction. A power injectable port a cath was placed and is ready for immediate use.   Electronically Signed   By: Aletta Edouard M.D.   On: 03/12/2014 16:41    Impression:    The patient is doing well approximately one month after completing preoperative chemoradiotherapy for rectal cancer. Surgery is planned after further chemotherapy which she has begun with Dr. Benay Spice.   Plan:  The patient will followup in 6 months.   Jodelle Gross, M.D., Ph.D.

## 2014-03-20 NOTE — Progress Notes (Signed)
He rates his pain as a 7 on a scale of 0-10. Pain is throbbing and intermittent.  Pt complains of, Loss of Sleep, Fatigue, Generalized Weakness and Poor Appetite, drinks boost 7 times a day and consumes oral solid foods as well. Reports urinary frequency and flank pain on bilateral, and Incontinence.  Pt states they urinate more than 5 times per night.  Pt has a ostomy bag which he empties 2-4 times a day. Stoma is bright pink, denies bleeding or foul discharge from stoma. Reports he is having rectal discharge which is stool colored and has a yellow bile appearance.

## 2014-03-20 NOTE — Telephone Encounter (Signed)
Left VM needing refill on xanax and oxycodone. Will be at [facility] at 3:15 today to pick up.

## 2014-03-30 ENCOUNTER — Other Ambulatory Visit: Payer: Self-pay | Admitting: Oncology

## 2014-04-01 ENCOUNTER — Other Ambulatory Visit: Payer: Self-pay | Admitting: *Deleted

## 2014-04-01 MED ORDER — CAPECITABINE 500 MG PO TABS: ORAL_TABLET | ORAL | Status: DC

## 2014-04-01 NOTE — Telephone Encounter (Signed)
Pt's wife called requesting re-fill to be sent to [facility] for Xeloda; states pt's next cycle begins 04/03/14.  Per Dr. Benay Spice; re-fill order sent.

## 2014-04-02 ENCOUNTER — Other Ambulatory Visit: Payer: Self-pay | Admitting: *Deleted

## 2014-04-02 MED ORDER — OXYCODONE HCL 10 MG PO TABS: 5.0000 mg | ORAL_TABLET | ORAL | Status: DC | PRN

## 2014-04-02 NOTE — Telephone Encounter (Signed)
Per Pt request; called and notified pt's wife that pain re-fill request will be available for pick-up at apt 04/03/14 during office visit.  Pt's wife verbalized understanding and expressed appreciation for call back.

## 2014-04-03 ENCOUNTER — Ambulatory Visit: Payer: Self-pay | Admitting: Nutrition

## 2014-04-03 ENCOUNTER — Ambulatory Visit (HOSPITAL_BASED_OUTPATIENT_CLINIC_OR_DEPARTMENT_OTHER): Payer: Self-pay

## 2014-04-03 ENCOUNTER — Other Ambulatory Visit (HOSPITAL_BASED_OUTPATIENT_CLINIC_OR_DEPARTMENT_OTHER): Payer: Self-pay

## 2014-04-03 ENCOUNTER — Encounter: Payer: Self-pay | Admitting: Nutrition

## 2014-04-03 ENCOUNTER — Telehealth: Payer: Self-pay | Admitting: Nurse Practitioner

## 2014-04-03 ENCOUNTER — Ambulatory Visit (HOSPITAL_BASED_OUTPATIENT_CLINIC_OR_DEPARTMENT_OTHER): Payer: Self-pay | Admitting: Oncology

## 2014-04-03 ENCOUNTER — Other Ambulatory Visit: Payer: Self-pay | Admitting: *Deleted

## 2014-04-03 VITALS — BP 102/62 | HR 81 | Temp 99.0°F | Resp 18 | Ht 78.0 in | Wt 149.6 lb

## 2014-04-03 DIAGNOSIS — C20 Malignant neoplasm of rectum: Secondary | ICD-10-CM

## 2014-04-03 DIAGNOSIS — R634 Abnormal weight loss: Secondary | ICD-10-CM

## 2014-04-03 DIAGNOSIS — D509 Iron deficiency anemia, unspecified: Secondary | ICD-10-CM

## 2014-04-03 DIAGNOSIS — Z5111 Encounter for antineoplastic chemotherapy: Secondary | ICD-10-CM

## 2014-04-03 LAB — CBC WITH DIFFERENTIAL/PLATELET
BASO%: 0.3 % (ref 0.0–2.0)
BASOS ABS: 0 10*3/uL (ref 0.0–0.1)
EOS%: 0.5 % (ref 0.0–7.0)
Eosinophils Absolute: 0 10*3/uL (ref 0.0–0.5)
HCT: 26 % — ABNORMAL LOW (ref 38.4–49.9)
HEMOGLOBIN: 8.2 g/dL — AB (ref 13.0–17.1)
LYMPH%: 7.9 % — AB (ref 14.0–49.0)
MCH: 26.7 pg — AB (ref 27.2–33.4)
MCHC: 31.5 g/dL — ABNORMAL LOW (ref 32.0–36.0)
MCV: 84.7 fL (ref 79.3–98.0)
MONO#: 0.2 10*3/uL (ref 0.1–0.9)
MONO%: 4.2 % (ref 0.0–14.0)
NEUT#: 3.3 10*3/uL (ref 1.5–6.5)
NEUT%: 87.1 % — ABNORMAL HIGH (ref 39.0–75.0)
Platelets: 204 10*3/uL (ref 140–400)
RBC: 3.07 10*6/uL — ABNORMAL LOW (ref 4.20–5.82)
RDW: 20.2 % — ABNORMAL HIGH (ref 11.0–14.6)
WBC: 3.8 10*3/uL — ABNORMAL LOW (ref 4.0–10.3)
lymph#: 0.3 10*3/uL — ABNORMAL LOW (ref 0.9–3.3)

## 2014-04-03 LAB — COMPREHENSIVE METABOLIC PANEL (CC13)
ALK PHOS: 76 U/L (ref 40–150)
ALT: <6 U/L (ref 0–55)
AST: 9 U/L (ref 5–34)
Albumin: 2.5 g/dL — ABNORMAL LOW (ref 3.5–5.0)
Anion Gap: 10 mEq/L (ref 3–11)
BILIRUBIN TOTAL: 0.29 mg/dL (ref 0.20–1.20)
BUN: 13.2 mg/dL (ref 7.0–26.0)
CO2: 27 mEq/L (ref 22–29)
Calcium: 9.3 mg/dL (ref 8.4–10.4)
Chloride: 96 mEq/L — ABNORMAL LOW (ref 98–109)
Creatinine: 1 mg/dL (ref 0.7–1.3)
Glucose: 143 mg/dl — ABNORMAL HIGH (ref 70–140)
POTASSIUM: 4.5 meq/L (ref 3.5–5.1)
Sodium: 133 mEq/L — ABNORMAL LOW (ref 136–145)
Total Protein: 6.8 g/dL (ref 6.4–8.3)

## 2014-04-03 LAB — TECHNOLOGIST REVIEW

## 2014-04-03 MED ORDER — HEPARIN SOD (PORK) LOCK FLUSH 100 UNIT/ML IV SOLN
500.0000 [IU] | Freq: Once | INTRAVENOUS | Status: AC | PRN
  Administered 2014-04-03: 500 [IU]
  Filled 2014-04-03: qty 5

## 2014-04-03 MED ORDER — DEXAMETHASONE SODIUM PHOSPHATE 10 MG/ML IJ SOLN
INTRAMUSCULAR | Status: AC
  Filled 2014-04-03: qty 1

## 2014-04-03 MED ORDER — SODIUM CHLORIDE 0.9 % IJ SOLN
10.0000 mL | INTRAMUSCULAR | Status: DC | PRN
  Administered 2014-04-03: 10 mL
  Filled 2014-04-03: qty 10

## 2014-04-03 MED ORDER — OXALIPLATIN CHEMO INJECTION 100 MG/20ML
100.0000 mg/m2 | Freq: Once | INTRAVENOUS | Status: AC
  Administered 2014-04-03: 185 mg via INTRAVENOUS
  Filled 2014-04-03: qty 37

## 2014-04-03 MED ORDER — DEXTROSE 5 % IV SOLN
Freq: Once | INTRAVENOUS | Status: AC
  Administered 2014-04-03: 14:00:00 via INTRAVENOUS

## 2014-04-03 MED ORDER — PALONOSETRON HCL INJECTION 0.25 MG/5ML
0.2500 mg | Freq: Once | INTRAVENOUS | Status: AC
  Administered 2014-04-03: 0.25 mg via INTRAVENOUS

## 2014-04-03 MED ORDER — DEXAMETHASONE SODIUM PHOSPHATE 10 MG/ML IJ SOLN
10.0000 mg | Freq: Once | INTRAMUSCULAR | Status: AC
  Administered 2014-04-03: 10 mg via INTRAVENOUS

## 2014-04-03 MED ORDER — ONDANSETRON HCL 4 MG PO TABS: 4.0000 mg | ORAL_TABLET | Freq: Three times a day (TID) | ORAL | Status: DC | PRN

## 2014-04-03 MED ORDER — HYDROMORPHONE HCL 4 MG/ML IJ SOLN
2.0000 mg | Freq: Once | INTRAMUSCULAR | Status: AC
  Administered 2014-04-03: 2 mg via INTRAVENOUS

## 2014-04-03 MED ORDER — HYDROMORPHONE HCL 4 MG/ML IJ SOLN
INTRAMUSCULAR | Status: AC
  Filled 2014-04-03: qty 1

## 2014-04-03 MED ORDER — PALONOSETRON HCL INJECTION 0.25 MG/5ML
INTRAVENOUS | Status: AC
  Filled 2014-04-03: qty 5

## 2014-04-03 NOTE — Progress Notes (Signed)
[facility] OFFICE PROGRESS NOTE   Diagnosis: Rectal cancer  INTERVAL HISTORY:   Grant [name] returns as scheduled. He completed a first cycle of Capox on 03/13/2014. No neuropathy symptoms. No mouth sores, diarrhea, or hand/foot pain. He reports nausea following chemotherapy that was not relieved with Compazine. No emesis. He has noted a "knot "at the left posterior arm for the past week.  Objective:  Vital signs in last 24 hours:  Blood pressure 102/62, pulse 81, temperature 99 F (37.2 C), temperature source Oral, resp. rate 18, height 6\' 6"  (1.981 m), weight 149 lb 9.6 oz (67.858 kg).    HEENT: No thrush or ulcers Resp: Lungs clear bilaterally Cardio: Distant heart sounds, regular rhythm GI: No hepatomegaly, left lower quadrant colostomy with dark stool Vascular: Trace pitting edema at the left greater than right lower leg and ankle  Skin: Positive without erythema Musculoskeletal: Bony prominence distal to the left olecranon   Portacath/PICC-without erythema  Lab Results:  Lab Results  Component Value Date   WBC 3.8* 04/03/2014   HGB 8.2* 04/03/2014   HCT 26.0* 04/03/2014   MCV 84.7 04/03/2014   PLT 204 04/03/2014   NEUTROABS 3.3 04/03/2014      Lab Results  Component Value Date   CEA 3.7 10/10/2013    Medications: I have reviewed the patient's current medications.  Assessment/Plan: 1. Rectal cancer-large pelvic mass with a colonoscopy 10/11/2013 confirming an obstructing tumor at 9 cm from the anal verge  CTs of the chest, abdomen, and revealed a large complex pelvic mass, gas in the bladder, loculated gas posterior to the bladder, right hydronephrosis, and small bilateral indeterminant lung nodules.   Initiation of radiation and Xeloda 12/18/2013; completed 01/29/2014.  Restaging CT evaluation at [facility] showed decrease in size of the rectosigmoid mass from 14.6 x 10.5 cm to 9.5 x 8.6 cm. Post radiation changes. Moderate right  hydronephrosis with a double-J ureteral stent in place and mild left hydronephrosis, unchanged. Pulmonary nodules unchanged. Circumferential bladder wall thickening likely secondary to radiation. Rectosigmoid mass abuts the posterior bladder and involvement cannot be excluded.  Cycle 1 Capox beginning 03/13/2014 2. Right hydronephrosis secondary to the obstructing pelvic mass, status post placement of a right percutaneous nephrostomy tube followed by a right ureter stent. 3. Microcytic anemia-likely iron deficiency anemia secondary to bleeding from the rectal tumor-now taking iron.  4. Possible colovesical fistula-on the CT 10/09/2013 evaluated by Dr. Alinda Money, followup CT cystogram 10/18/2013 revealed no fistula.  5. Anorexia/weight loss secondary to #1. 6. Indeterminate lung nodules on a CT of the chest 10/09/2013. Stable on CT 03/03/2014. 7. Family history of pancreas and colon cancer. 8. 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 10. Hospitalization 12/10/2013 through 12/12/2013 presenting with diarrhea. CT abdomen/pelvis showed an enlarging rectal mass filling almost the entire pelvis. Air noted in the bladder and renal collecting system. Linear collection of air noted along the anterior wall of the colorectal mass abutting the bladder felt to possibly represent a site of fistulization. Stool noted throughout the right colon. Transferred to [facility]. Neoadjuvant chemotherapy/radiation recommended. 11. Skin toxicity perineum related to radiation and Xeloda.    Disposition:  Grant [name] has completed 1 treatment with Capox. The plan is to proceed with cycle 2 today. We will adjust the anti-emetic regimen with chemotherapy today and he will use Zofran as needed at home. The nodular area at the left forearm is likely a benign finding.  He will return for an office visit and the next cycle of  chemotherapy on 04/24/2014. We will plan a restaging CT evaluation after cycle 3.  Betsy Coder, MD  04/03/2014  2:38 PM

## 2014-04-03 NOTE — Telephone Encounter (Signed)
Pt confirmed labs/ov per 11/19 POF, gave pt AVS.... KJ, sent msg to add chemo

## 2014-04-03 NOTE — Progress Notes (Signed)
Nutrition follow up completed with patient. Weight documented as 149.6 pounds on November 19, increased from 144 pounds November 5.  Patient attributes this to heavy boots he is wearing today. Patient does confirm he does not think he has lost weight.  He continues to have fatigue and poor appetite. Patient reports drinking boost + 4-6 bottles daily in addition to meals.  Nutrition diagnosis: Unintended weight loss improved.  Intervention: Patient educated to continue high-calorie, high-protein meals and snacks. Patient to continue boost +4 -6 bottles daily to promote weight maintenance/gain. Coupons were provided. Questions answered.  Teach back method used.  Monitoring, evaluation, goals: Patient will tolerate adequate calories and protein to promote healing and weight stabilization.  Next visit: Thursday, December 12, during chemotherapy.  **Disclaimer: This note was dictated with voice recognition software. Similar sounding words can inadvertently be transcribed and this note may contain transcription errors which may not have been corrected upon publication of note.**

## 2014-04-16 ENCOUNTER — Telehealth: Payer: Self-pay | Admitting: *Deleted

## 2014-04-16 MED ORDER — OXYCODONE HCL 10 MG PO TABS: 5.0000 mg | ORAL_TABLET | ORAL | Status: DC | PRN

## 2014-04-16 NOTE — Telephone Encounter (Signed)
Call from pt's S/O requesting refill on Oxycodone Rx. Also asking if OK for pt to receive flu and pneumococcal vaccine with PCP? YES, OK for vaccines- per Dr. Benay Spice. Rx will be left in prescription book for pick up.

## 2014-04-18 ENCOUNTER — Other Ambulatory Visit: Payer: Self-pay | Admitting: *Deleted

## 2014-04-18 MED ORDER — CAPECITABINE 500 MG PO TABS: ORAL_TABLET | ORAL | Status: DC

## 2014-04-18 NOTE — Telephone Encounter (Signed)
Message from pt's wife requesting Xeloda Rx to be sent to [facility] so they can pick it up when in the area for PCP visit 04/21/14. Same done, per Dr. Benay Spice.

## 2014-04-20 ENCOUNTER — Other Ambulatory Visit: Payer: Self-pay | Admitting: Oncology

## 2014-04-21 ENCOUNTER — Ambulatory Visit (INDEPENDENT_AMBULATORY_CARE_PROVIDER_SITE_OTHER): Payer: Medicaid Other | Admitting: Internal Medicine

## 2014-04-21 VITALS — BP 104/60 | HR 90 | Temp 98.4°F | Resp 14 | Ht 78.0 in | Wt 143.0 lb

## 2014-04-21 DIAGNOSIS — Z125 Encounter for screening for malignant neoplasm of prostate: Secondary | ICD-10-CM

## 2014-04-21 DIAGNOSIS — Z0000 Encounter for general adult medical examination without abnormal findings: Secondary | ICD-10-CM

## 2014-04-21 DIAGNOSIS — E43 Unspecified severe protein-calorie malnutrition: Secondary | ICD-10-CM

## 2014-04-21 DIAGNOSIS — H543 Unqualified visual loss, both eyes: Secondary | ICD-10-CM

## 2014-04-21 DIAGNOSIS — Z23 Encounter for immunization: Secondary | ICD-10-CM

## 2014-04-21 DIAGNOSIS — R739 Hyperglycemia, unspecified: Secondary | ICD-10-CM

## 2014-04-21 DIAGNOSIS — D509 Iron deficiency anemia, unspecified: Secondary | ICD-10-CM

## 2014-04-21 DIAGNOSIS — K219 Gastro-esophageal reflux disease without esophagitis: Secondary | ICD-10-CM

## 2014-04-21 MED ORDER — OMEPRAZOLE 20 MG PO CPDR: 20.0000 mg | DELAYED_RELEASE_CAPSULE | Freq: Every day | ORAL | Status: DC

## 2014-04-21 NOTE — Progress Notes (Signed)
Patient ID: Grant [name], male   DOB: 07/08/1955, 59 y.o.   MRN: [id number]   Grant [name], is a 58 y.o. male  QVZ:[id number]  [id number]:[id number]  DOB - Sep 15, 1955  CC:  Chief Complaint  Patient presents with  . Annual Exam       HPI: Grant [name] is a 58 y.o. male here today for Annual Physical Visit. He has not had a well visit in several years. He has had 2 cycles of chemotherapy and radiation but this had not suffeciently reduced tumor size. He is currently is undergoing neo-adjuvant therapy with Xeloda and Oxaliplatin for  rectal cancer. He also has a colo-vesicular fistula which is under the care of the surgeon at [facility]. The plan is to further shrink the tumor with chemotherapy and then undergo resection at [facility]. He reports that since diagnosis he has lost a considerable amount of weight and has a variable appetite. He was prescribed marinol for nausea and appetite stimulation but was unable to obtain it as it was cost prohibitive. He has substituted marijuana which he reports has been the best treatment for nausea and appetite stimulation.   Pt has c/o heartburn and has been taking TUMS with some moderate amount of relief. However it has been persistent.   He also has microcytic anemia caused from blood loss associated with bleeding from the tumor. He was started on Iron 6 months ago. His indices now show a normal MCV although Hb remains decreased.  He reports that he has a good support system with his significant other of more than 20 years. She is present here today with him. I have raised the issue of an advanced directive and patient reports that he has not given this much thought. I have made the patient aware of the MOST form and he and his partner will discuss further.   Patient has No headache, No chest pain, No abdominal pain,  No new weakness tingling or numbness, No Cough - SOB.  Allergies  Allergen Reactions  . Strawberry Anaphylaxis and Hives  . Sunflower Oil  Anaphylaxis and Hives    seeds  . Watermelon [Citrullus Vulgaris] Anaphylaxis and Hives   Past Medical History  Diagnosis Date  . Fecal incontinence   . 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 Cancer 10/11/13    rectal cancer-adenocarcinoma  . Chronic kidney disease     right stent  . Hx of radiation therapy 12/18/13-01/29/14    RECTAL / 54gy   Current Outpatient Prescriptions on File Prior to Visit  Medication Sig Dispense Refill  . ALPRAZolam (XANAX) 0.5 MG tablet Take 1 tablet (0.5 mg total) by mouth 2 (two) times daily as needed for anxiety. 60 tablet 0  . Bisacodyl (DULCOLAX PO) Take 1 tablet by mouth 2 (two) times daily as needed (constipation/laxative.).    Marland Kitchen ferrous sulfate 325 (65 FE) MG tablet Take 1 tablet (325 mg total) by mouth 2 (two) times daily with a meal. 60 tablet 0  . lactose free nutrition (BOOST PLUS) LIQD Take 237 mLs by mouth. From four to eight times daily.    Marland Kitchen lidocaine-prilocaine (EMLA) cream Apply 1 application topically as needed. Apply to [facility] site 1-2 hours prior to stick and cover with plastic wrap 30 g 11  . Multiple Vitamins-Minerals ([employer] SPECTRAVITE ULTRA MENS) TABS Take 1 tablet by mouth every morning.     . ondansetron (ZOFRAN) 4 MG tablet Take 1 tablet (4 mg total) by mouth  every 8 (eight) hours as needed for nausea or vomiting. 20 tablet 2  . Oxycodone HCl 10 MG TABS Take 0.5-1 tablets (5-10 mg total) by mouth every 4 (four) hours as needed (pain.). 75 tablet 0  . polyethylene glycol (MIRALAX / GLYCOLAX) packet Take 17 g by mouth daily as needed for mild constipation.    Marland Kitchen spironolactone (ALDACTONE) 25 MG tablet Take 25 mg by mouth every morning.     Derrill Memo ON 04/24/2014] capecitabine (XELODA) 500 MG tablet Takes 2000 mg in am and 1500 mg in pm X 14 days, 7 day rest. Start date 04/03/14. (Patient not taking: Reported on 04/21/2014) 98 tablet 0  . prochlorperazine (COMPAZINE) 10 MG tablet Take 1 tablet (10 mg total) by mouth every 6  (six) hours as needed for nausea. (Patient not taking: Reported on 04/21/2014) 60 tablet 1   No current facility-administered medications on file prior to visit.   Family History  Problem Relation Age of Onset  . Cancer Father     pancreatic   . Cancer Maternal Uncle    History   Social History  . Marital Status: Single    Spouse Name: N/A    Number of Children: N/A  . Years of Education: N/A   Occupational History  . Not on file.   Social History Main Topics  . Smoking status: Former Smoker    Quit date: 05/16/1988  . Smokeless tobacco: Current User    Types: Snuff  . Alcohol Use: Yes     Comment: OCCASIONAL   . Drug Use: Yes    Special: Marijuana  . Sexual Activity: Not on file   Other Topics Concern  . Not on file   Social History Narrative    Review of Systems: Constitutional: Negative for fever, chills, diaphoresis, activity change, appetite change and fatigue. HENT: Negative for ear pain, nosebleeds, congestion, facial swelling, rhinorrhea, neck pain, neck stiffness and ear discharge.  Eyes: Negative for pain, discharge, redness, itching and visual disturbance. Respiratory: Negative for cough, choking, chest tightness, shortness of breath, wheezing and stridor.  Cardiovascular: Negative for chest pain, palpitations and leg swelling. Gastrointestinal: Negative for abdominal distention. Genitourinary: Negative for dysuria, urgency, frequency, hematuria, flank pain, decreased urine volume, difficulty urinating.  Musculoskeletal: Negative for back pain, joint swelling, arthralgia and gait problem. Neurological: Negative for dizziness, tremors, seizures, syncope, facial asymmetry, speech difficulty, weakness, light-headedness, numbness and headaches.  Hematological: Negative for adenopathy. Does not bruise/bleed easily. Psychiatric/Behavioral: Negative for hallucinations, behavioral problems, confusion, dysphoric mood, decreased concentration and agitation.     Objective:   Filed Vitals:   04/21/14 1409  BP: 104/60  Pulse: 90  Temp: 98.4 F (36.9 C)  Resp: 14    Physical Exam: Constitutional: Patient appears cachectic and emaciated. Chronically ill appearing. HENT: Normocephalic, atraumatic, External right and left ear normal. Oropharynx is clear and moist.  Eyes: Conjunctivae and EOM are normal. PERRLA, no scleral icterus. Neck: Normal ROM. Neck supple. No JVD. No tracheal deviation. No thyromegaly. CVS: RRR, S1/S2 +, no murmurs, no gallops, no carotid bruit.  Pulmonary: Effort and breath sounds normal, no stridor, rhonchi, wheezes, rales.  Abdominal: Soft, scaphoid, BS +, no distension, tenderness, rebound or guarding.  Musculoskeletal: Normal range of motion. No edema and no tenderness.  Neuro: Alert. Normal reflexes, muscle tone coordination. No cranial nerve deficit. Skin: Skin is warm and dry. No rash noted. Not diaphoretic. No erythema. No pallor. Psychiatric: Normal mood and affect. Behavior, judgment, thought content normal.  Lab Results  Component Value Date   WBC 3.8* 04/03/2014   HGB 8.2* 04/03/2014   HCT 26.0* 04/03/2014   MCV 84.7 04/03/2014   PLT 204 04/03/2014   Lab Results  Component Value Date   CREATININE 1.0 04/03/2014   BUN 13.2 04/03/2014   NA 133* 04/03/2014   K 4.5 04/03/2014   CL 92* 03/12/2014   CO2 27 04/03/2014    No results found for: HGBA1C Lipid Panel     Component Value Date/Time   TRIG 88 10/14/2013 0500       Assessment and plan:   1.Annual physical exam/Immunization due  - Pt has active cancer and his Physical Performance Index is poor. I will screen for prostate cancer with PSA as risk is increased in the presence of one cancer. Also check lipid levels although given his state of nutrition he will likely have low cholesterol. He denies depression but is appropriately concerned about his condition. He is using a w/c for mobility due to his frail condition. He is not UTD on  Immunizations. There is no contraindication to Influenza or Tdap. However I will only give one immunization at a time due to his immune compromised state. - Lipid panel - PSA - Flu Vaccine QUAD 36+ mos PF IM (Fluarix Quad PF)  2. Impaired vision - Vision screen shows impaired vision and patient agrees that he is unable to see well.  - Ambulatory referral to Opthalmology   3.  Gastroesophageal reflux disease, esophagitis presence not specified - Pt describing symotoms of GERD. I will give a trial of PPI and re-evaluate in 87 weeks. - omeprazole (PRILOSEC) 20 MG capsule; Take 1 capsule (20 mg total) by mouth daily.  Dispense: 30 capsule; Refill: 3   4. Severe protein-calorie malnutrition - Pt has been seeing the Dietitian at he [place]. He is taking boost protein supplements. We have discussed the significance of good nutrition in chronic disease state.   5. Hyperglycemia - Pt has had several elevated blood sugar readings. Will screen for diabetes. - Hemoglobin A1C  6. Microcytic anemia - Pt was started on Iron supplementation 6 months ago. Will check Iron stores. He feels that oral Iron is causing nausea and I willevaluate iron to see if we can adjust the dose to a lesser dose. - Iron and TIBC    Return in about 3 months (around 07/21/2014) for protein calorie malnutrition, GERD, immunizations, Review of lab data.  The patient was given clear instructions to go to ER or return to medical center if symptoms don't improve, worsen or new problems develop. The patient verbalized understanding. The patient was told to call to get lab results if they haven't heard anything in the next week.     This note has been created with Surveyor, quantity. Any transcriptional errors are unintentional.    Jasun Gasparini A., MD [place], [place]   04/21/2014, 3:50 PM

## 2014-04-24 ENCOUNTER — Ambulatory Visit (HOSPITAL_COMMUNITY)
Admission: RE | Admit: 2014-04-24 | Discharge: 2014-04-24 | Disposition: A | Discharge status: Home / Self Care | Payer: Medicaid Other | Source: Ambulatory Visit | Attending: Oncology | Admitting: Oncology

## 2014-04-24 ENCOUNTER — Ambulatory Visit (HOSPITAL_BASED_OUTPATIENT_CLINIC_OR_DEPARTMENT_OTHER): Payer: Medicaid Other | Admitting: Nurse Practitioner

## 2014-04-24 ENCOUNTER — Telehealth: Payer: Self-pay | Admitting: *Deleted

## 2014-04-24 ENCOUNTER — Telehealth: Payer: Self-pay | Admitting: Nurse Practitioner

## 2014-04-24 ENCOUNTER — Other Ambulatory Visit (HOSPITAL_BASED_OUTPATIENT_CLINIC_OR_DEPARTMENT_OTHER): Payer: Medicaid Other

## 2014-04-24 ENCOUNTER — Ambulatory Visit (HOSPITAL_BASED_OUTPATIENT_CLINIC_OR_DEPARTMENT_OTHER): Payer: Medicaid Other

## 2014-04-24 ENCOUNTER — Ambulatory Visit: Payer: Self-pay | Admitting: Nutrition

## 2014-04-24 VITALS — BP 110/67 | HR 94 | Temp 99.0°F | Resp 18 | Ht 78.0 in | Wt 143.5 lb

## 2014-04-24 DIAGNOSIS — C20 Malignant neoplasm of rectum: Secondary | ICD-10-CM

## 2014-04-24 DIAGNOSIS — R918 Other nonspecific abnormal finding of lung field: Secondary | ICD-10-CM | POA: Diagnosis not present

## 2014-04-24 DIAGNOSIS — D609 Acquired pure red cell aplasia, unspecified: Secondary | ICD-10-CM

## 2014-04-24 DIAGNOSIS — Z800 Family history of malignant neoplasm of digestive organs: Secondary | ICD-10-CM | POA: Insufficient documentation

## 2014-04-24 DIAGNOSIS — Z79899 Other long term (current) drug therapy: Secondary | ICD-10-CM | POA: Diagnosis not present

## 2014-04-24 DIAGNOSIS — Z5111 Encounter for antineoplastic chemotherapy: Secondary | ICD-10-CM

## 2014-04-24 DIAGNOSIS — D509 Iron deficiency anemia, unspecified: Secondary | ICD-10-CM | POA: Insufficient documentation

## 2014-04-24 DIAGNOSIS — Z923 Personal history of irradiation: Secondary | ICD-10-CM | POA: Insufficient documentation

## 2014-04-24 DIAGNOSIS — R630 Anorexia: Secondary | ICD-10-CM | POA: Insufficient documentation

## 2014-04-24 DIAGNOSIS — R634 Abnormal weight loss: Secondary | ICD-10-CM | POA: Diagnosis not present

## 2014-04-24 LAB — CBC WITH DIFFERENTIAL/PLATELET
BASO%: 0.2 % (ref 0.0–2.0)
BASOS ABS: 0 10*3/uL (ref 0.0–0.1)
EOS ABS: 0 10*3/uL (ref 0.0–0.5)
EOS%: 0.5 % (ref 0.0–7.0)
HEMATOCRIT: 22.8 % — AB (ref 38.4–49.9)
HEMOGLOBIN: 7.2 g/dL — AB (ref 13.0–17.1)
LYMPH#: 0.3 10*3/uL — AB (ref 0.9–3.3)
LYMPH%: 6.3 % — AB (ref 14.0–49.0)
MCH: 26.5 pg — ABNORMAL LOW (ref 27.2–33.4)
MCHC: 31.6 g/dL — ABNORMAL LOW (ref 32.0–36.0)
MCV: 83.8 fL (ref 79.3–98.0)
MONO#: 0.3 10*3/uL (ref 0.1–0.9)
MONO%: 6.3 % (ref 0.0–14.0)
NEUT#: 3.6 10*3/uL (ref 1.5–6.5)
NEUT%: 86.7 % — ABNORMAL HIGH (ref 39.0–75.0)
PLATELETS: 219 10*3/uL (ref 140–400)
RBC: 2.72 10*6/uL — ABNORMAL LOW (ref 4.20–5.82)
RDW: 22.4 % — ABNORMAL HIGH (ref 11.0–14.6)
WBC: 4.2 10*3/uL (ref 4.0–10.3)

## 2014-04-24 LAB — COMPREHENSIVE METABOLIC PANEL (CC13)
ALT: 8 U/L (ref 0–55)
ANION GAP: 9 meq/L (ref 3–11)
AST: 8 U/L (ref 5–34)
Albumin: 2.3 g/dL — ABNORMAL LOW (ref 3.5–5.0)
Alkaline Phosphatase: 75 U/L (ref 40–150)
BILIRUBIN TOTAL: 0.31 mg/dL (ref 0.20–1.20)
BUN: 14.7 mg/dL (ref 7.0–26.0)
CALCIUM: 9.1 mg/dL (ref 8.4–10.4)
CHLORIDE: 96 meq/L — AB (ref 98–109)
CO2: 30 mEq/L — ABNORMAL HIGH (ref 22–29)
CREATININE: 1 mg/dL (ref 0.7–1.3)
EGFR: 84 mL/min/{1.73_m2} — AB (ref >90)
GLUCOSE: 159 mg/dL — AB (ref 70–140)
Potassium: 4.5 mEq/L (ref 3.5–5.1)
Sodium: 135 mEq/L — ABNORMAL LOW (ref 136–145)
Total Protein: 6.4 g/dL (ref 6.4–8.3)

## 2014-04-24 LAB — PREPARE RBC (CROSSMATCH)

## 2014-04-24 LAB — HOLD TUBE, BLOOD BANK

## 2014-04-24 MED ORDER — DEXAMETHASONE SODIUM PHOSPHATE 10 MG/ML IJ SOLN
10.0000 mg | Freq: Once | INTRAMUSCULAR | Status: AC
  Administered 2014-04-24: 10 mg via INTRAVENOUS

## 2014-04-24 MED ORDER — PALONOSETRON HCL INJECTION 0.25 MG/5ML
0.2500 mg | Freq: Once | INTRAVENOUS | Status: AC
  Administered 2014-04-24: 0.25 mg via INTRAVENOUS

## 2014-04-24 MED ORDER — SODIUM CHLORIDE 0.9 % IJ SOLN
10.0000 mL | INTRAMUSCULAR | Status: DC | PRN
  Administered 2014-04-24: 10 mL
  Filled 2014-04-24: qty 10

## 2014-04-24 MED ORDER — HYDROMORPHONE HCL 1 MG/ML IJ SOLN
2.0000 mg | Freq: Once | INTRAMUSCULAR | Status: AC
  Administered 2014-04-24: 2 mg via INTRAVENOUS
  Filled 2014-04-24: qty 2

## 2014-04-24 MED ORDER — DEXAMETHASONE SODIUM PHOSPHATE 10 MG/ML IJ SOLN
INTRAMUSCULAR | Status: AC
  Filled 2014-04-24: qty 1

## 2014-04-24 MED ORDER — HYDROMORPHONE HCL 4 MG/ML IJ SOLN
INTRAMUSCULAR | Status: AC
  Filled 2014-04-24: qty 1

## 2014-04-24 MED ORDER — PALONOSETRON HCL INJECTION 0.25 MG/5ML
INTRAVENOUS | Status: AC
  Filled 2014-04-24: qty 5

## 2014-04-24 MED ORDER — HEPARIN SOD (PORK) LOCK FLUSH 100 UNIT/ML IV SOLN
500.0000 [IU] | Freq: Once | INTRAVENOUS | Status: AC | PRN
  Administered 2014-04-24: 500 [IU]
  Filled 2014-04-24: qty 5

## 2014-04-24 MED ORDER — OXALIPLATIN CHEMO INJECTION 100 MG/20ML
100.0000 mg/m2 | Freq: Once | INTRAVENOUS | Status: AC
  Administered 2014-04-24: 185 mg via INTRAVENOUS
  Filled 2014-04-24: qty 37

## 2014-04-24 MED ORDER — DEXAMETHASONE SODIUM PHOSPHATE 20 MG/5ML IJ SOLN
INTRAMUSCULAR | Status: AC
  Filled 2014-04-24: qty 5

## 2014-04-24 MED ORDER — DEXTROSE 5 % IV SOLN
Freq: Once | INTRAVENOUS | Status: AC
  Administered 2014-04-24: 14:00:00 via INTRAVENOUS

## 2014-04-24 NOTE — Telephone Encounter (Signed)
Pt confirmed labs/ov per 12/10 POF, gave pt AVS..... KJ, sent msg to add chemo

## 2014-04-24 NOTE — Telephone Encounter (Signed)
Per staff message and POF I have scheduled appts. Advised scheduler of appts. JMW  

## 2014-04-24 NOTE — Progress Notes (Signed)
[facility] OFFICE PROGRESS NOTE   Diagnosis:  Rectal cancer  INTERVAL HISTORY:   Grant [name] returns as scheduled. He completed cycle 2 CAPOX beginning 04/03/2014. He had nausea beginning the evening of chemotherapy and lasting for 2 days. He vomited 1 time. No mouth sores. No significant diarrhea. Oral intake is "fair to poor". He avoided cold for 5 days following the chemotherapy. No numbness or tingling today.  Objective:  Vital signs in last 24 hours:  Blood pressure 110/67, pulse 94, temperature 99 F (37.2 C), temperature source Oral, resp. rate 18, height 6\' 6"  (1.981 m), weight 143 lb 8 oz (65.091 kg).    HEENT: no thrush or ulcers. Resp: lungs are clear bilaterally. Cardio: distant heart sounds. Regular rhythm. GI: no hepatomegaly. Left lower quadrant colostomy. Soft stool in the collection bag. Vascular: 1+ pitting edema at the lower legs bilaterally. Port-A-Cath without erythema.  Lab Results:  Lab Results  Component Value Date   WBC 4.2 04/24/2014   HGB 7.2* 04/24/2014   HCT 22.8* 04/24/2014   MCV 83.8 04/24/2014   PLT 219 04/24/2014   NEUTROABS 3.6 04/24/2014    Imaging:  No results found.  Medications: I have reviewed the patient's current medications.  Assessment/Plan: 1. Rectal cancer-large pelvic mass with a colonoscopy 10/11/2013 confirming an obstructing tumor at 9 cm from the anal verge  CTs of the chest, abdomen, and revealed a large complex pelvic mass, gas in the bladder, loculated gas posterior to the bladder, right hydronephrosis, and small bilateral indeterminant lung nodules.   Initiation of radiation and Xeloda 12/18/2013; completed 01/29/2014.  Restaging CT evaluation at [facility] showed decrease in size of the rectosigmoid mass from 14.6 x 10.5 cm to 9.5 x 8.6 cm. Post radiation changes. Moderate right hydronephrosis with a double-J ureteral stent in place and mild left hydronephrosis, unchanged. Pulmonary nodules  unchanged. Circumferential bladder wall thickening likely secondary to radiation. Rectosigmoid mass abuts the posterior bladder and involvement cannot be excluded.  Cycle 1 Capox beginning 03/13/2014  Cycle 2 CAPOX beginning 04/03/2014.  Cycle 3 CAPOX beginning 04/24/2014. 2. Right hydronephrosis secondary to the obstructing pelvic mass, status post placement of a right percutaneous nephrostomy tube followed by a right ureter stent. 3. Microcytic anemia-likely iron deficiency anemia secondary to bleeding from the rectal tumor-now taking iron.  4. Possible colovesical fistula-on the CT 10/09/2013 evaluated by Dr. Alinda Money, followup CT cystogram 10/18/2013 revealed no fistula.  5. Anorexia/weight loss secondary to #1. 6. Indeterminate lung nodules on a CT of the chest 10/09/2013. Stable on CT 03/03/2014. 7. Family history of pancreas and colon cancer. 8. 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 10. Hospitalization 12/10/2013 through 12/12/2013 presenting with diarrhea. CT abdomen/pelvis showed an enlarging rectal mass filling almost the entire pelvis. Air noted in the bladder and renal collecting system. Linear collection of air noted along the anterior wall of the colorectal mass abutting the bladder felt to possibly represent a site of fistulization. Stool noted throughout the right colon. Transferred to [facility]. Neoadjuvant chemotherapy/radiation recommended. 11. Skin toxicity perineum related to radiation and Xeloda.   Disposition: Grant [name] appears unchanged. He has completed 2 cycles of CAPOX. Plan to proceed with cycle 3 today as scheduled.   He has progressive anemia. We are arranging for a blood transfusion on 04/26/2014.  He will return for a followup visit on 05/14/2014. The plan is for a restaging CT evaluation a few days prior to that visit.    Ned Card ANP/GNP-BC   04/24/2014  1:20 PM

## 2014-04-24 NOTE — Progress Notes (Signed)
Nutrition follow up completed with patient. Current weight documented as 143 pounds December 7. Patient is positive for 1+ pitting edema of bilateral lower legs. Patient reports fair intake.  States he is eating"better" and continues to drink between 4 and 5 bottles of boost plus daily. Patient remains severely malnourished.   Nutrition diagnosis: Unintended weight loss continues.  Intervention: Patient educated on strategies for increasing calories and protein throughout the day. Recommended patient continue oral nutrition supplements. Provided recipes for increasing protein in small volumes. Questions answered and teach back method used.  Monitoring, evaluation, goals: Patient will tolerate increased calories and protein to improve edema and promote lean muscle mass.  Next visit:Wednesday, December 30, during chemotherapy.  **Disclaimer: This note was dictated with voice recognition software. Similar sounding words can inadvertently be transcribed and this note may contain transcription errors which may not have been corrected upon publication of note.**

## 2014-04-24 NOTE — Progress Notes (Signed)
OK to treat with Hgb 7.2  Per Ned Card.  Pt to receive 2 units blood Saturday 12/12.  Type and Cross drawn.

## 2014-04-24 NOTE — Patient Instructions (Signed)
[facility] Discharge Instructions for Patients Receiving Chemotherapy  Today you received the following chemotherapy agents: oxaliplatin  To help prevent nausea and vomiting after your treatment, we encourage you to take your nausea medication.  Take it as often as prescribed.     If you develop nausea and vomiting that is not controlled by your nausea medication, call the clinic. If it is after clinic hours your family physician or the after hours number for the clinic or go to the Emergency Department.   BELOW ARE SYMPTOMS THAT SHOULD BE REPORTED IMMEDIATELY:  *FEVER GREATER THAN 100.5 F  *CHILLS WITH OR WITHOUT FEVER  NAUSEA AND VOMITING THAT IS NOT CONTROLLED WITH YOUR NAUSEA MEDICATION  *UNUSUAL SHORTNESS OF BREATH  *UNUSUAL BRUISING OR BLEEDING  TENDERNESS IN MOUTH AND THROAT WITH OR WITHOUT PRESENCE OF ULCERS  *URINARY PROBLEMS  *BOWEL PROBLEMS  UNUSUAL RASH Items with * indicate a potential emergency and should be followed up as soon as possible.  Feel free to call the clinic you have any questions or concerns. The clinic phone number is [phone].   I have been informed and understand all the instructions given to me. I know to contact the clinic, my physician, or go to the Emergency Department if any problems should occur. I do not have any questions at this time, but understand that I may call the clinic during office hours   should I have any questions or need assistance in obtaining follow up care.    __________________________________________  _____________  __________ Signature of Patient or Authorized Representative            Date                   Time    __________________________________________ Nurse's Signature    

## 2014-04-26 ENCOUNTER — Ambulatory Visit (HOSPITAL_BASED_OUTPATIENT_CLINIC_OR_DEPARTMENT_OTHER): Payer: Medicaid Other

## 2014-04-26 VITALS — BP 99/53 | HR 62 | Temp 98.6°F | Resp 18

## 2014-04-26 DIAGNOSIS — D509 Iron deficiency anemia, unspecified: Secondary | ICD-10-CM

## 2014-04-26 DIAGNOSIS — C20 Malignant neoplasm of rectum: Secondary | ICD-10-CM | POA: Diagnosis not present

## 2014-04-26 DIAGNOSIS — R52 Pain, unspecified: Secondary | ICD-10-CM

## 2014-04-26 MED ORDER — SODIUM CHLORIDE 0.9 % IV SOLN
250.0000 mL | Freq: Once | INTRAVENOUS | Status: AC
  Administered 2014-04-26: 250 mL via INTRAVENOUS
  Administered 2014-04-26: 100 mL via INTRAVENOUS

## 2014-04-26 MED ORDER — HYDROMORPHONE HCL 4 MG/ML IJ SOLN: 2.0000 mg | INTRAMUSCULAR | Status: DC | PRN

## 2014-04-26 MED ORDER — HYDROMORPHONE HCL 4 MG/ML IJ SOLN
2.0000 mg | INTRAMUSCULAR | Status: DC | PRN
  Administered 2014-04-26 (×4): 2 mg via INTRAVENOUS

## 2014-04-26 MED ORDER — SODIUM CHLORIDE 0.9 % IJ SOLN
10.0000 mL | INTRAMUSCULAR | Status: DC | PRN
  Filled 2014-04-26: qty 10

## 2014-04-26 MED ORDER — HEPARIN SOD (PORK) LOCK FLUSH 100 UNIT/ML IV SOLN
250.0000 [IU] | INTRAVENOUS | Status: DC | PRN
  Filled 2014-04-26: qty 5

## 2014-04-26 MED ORDER — HEPARIN SOD (PORK) LOCK FLUSH 100 UNIT/ML IV SOLN
500.0000 [IU] | Freq: Every day | INTRAVENOUS | Status: DC | PRN
  Filled 2014-04-26: qty 5

## 2014-04-26 MED ORDER — HYDROMORPHONE HCL 4 MG/ML IJ SOLN
INTRAMUSCULAR | Status: AC
  Filled 2014-04-26: qty 1

## 2014-04-26 MED ORDER — SODIUM CHLORIDE 0.9 % IJ SOLN
3.0000 mL | INTRAMUSCULAR | Status: DC | PRN
  Filled 2014-04-26: qty 10

## 2014-04-26 NOTE — Patient Instructions (Signed)
Blood transfusion

## 2014-04-26 NOTE — Progress Notes (Signed)
Pt came in for blood transfusion with noted pain of 7 out of 10 - Dr Marin Olp notified and obtained orders for diluadid IV.  Post administration pain was relieved.

## 2014-04-27 LAB — TYPE AND SCREEN
ABO/RH(D): A POS
ANTIBODY SCREEN: NEGATIVE
UNIT DIVISION: 0
Unit division: 0

## 2014-05-01 ENCOUNTER — Other Ambulatory Visit: Payer: Self-pay | Admitting: *Deleted

## 2014-05-01 MED ORDER — OXYCODONE HCL 10 MG PO TABS: 5.0000 mg | ORAL_TABLET | ORAL | Status: DC | PRN

## 2014-05-01 NOTE — Telephone Encounter (Signed)
Call from significant other requesting refill on pain med. Will pick up Friday.

## 2014-05-02 NOTE — Progress Notes (Signed)
Per review noted missing entry per vitals- entered per written history.

## 2014-05-11 ENCOUNTER — Other Ambulatory Visit: Payer: Self-pay | Admitting: Oncology

## 2014-05-12 ENCOUNTER — Encounter (HOSPITAL_COMMUNITY): Payer: Self-pay

## 2014-05-12 ENCOUNTER — Ambulatory Visit (HOSPITAL_COMMUNITY)
Admission: RE | Admit: 2014-05-12 | Discharge: 2014-05-12 | Disposition: A | Discharge status: Home / Self Care | Payer: Medicaid Other | Source: Ambulatory Visit | Attending: Nurse Practitioner | Admitting: Nurse Practitioner

## 2014-05-12 ENCOUNTER — Encounter: Payer: Self-pay | Admitting: Internal Medicine

## 2014-05-12 ENCOUNTER — Other Ambulatory Visit (HOSPITAL_BASED_OUTPATIENT_CLINIC_OR_DEPARTMENT_OTHER): Payer: Medicaid Other

## 2014-05-12 DIAGNOSIS — C20 Malignant neoplasm of rectum: Secondary | ICD-10-CM | POA: Insufficient documentation

## 2014-05-12 DIAGNOSIS — N2889 Other specified disorders of kidney and ureter: Secondary | ICD-10-CM | POA: Insufficient documentation

## 2014-05-12 DIAGNOSIS — R112 Nausea with vomiting, unspecified: Secondary | ICD-10-CM | POA: Insufficient documentation

## 2014-05-12 DIAGNOSIS — N1330 Unspecified hydronephrosis: Secondary | ICD-10-CM

## 2014-05-12 DIAGNOSIS — R918 Other nonspecific abnormal finding of lung field: Secondary | ICD-10-CM

## 2014-05-12 LAB — COMPREHENSIVE METABOLIC PANEL (CC13)
ALT: 13 U/L (ref 0–55)
AST: 16 U/L (ref 5–34)
Albumin: 2.5 g/dL — ABNORMAL LOW (ref 3.5–5.0)
Alkaline Phosphatase: 85 U/L (ref 40–150)
Anion Gap: 9 mEq/L (ref 3–11)
BUN: 15.7 mg/dL (ref 7.0–26.0)
CALCIUM: 9.5 mg/dL (ref 8.4–10.4)
CHLORIDE: 93 meq/L — AB (ref 98–109)
CO2: 32 mEq/L — ABNORMAL HIGH (ref 22–29)
Creatinine: 1.2 mg/dL (ref 0.7–1.3)
EGFR: 69 mL/min/{1.73_m2} — ABNORMAL LOW (ref >90)
Glucose: 148 mg/dl — ABNORMAL HIGH (ref 70–140)
Potassium: 4.5 mEq/L (ref 3.5–5.1)
Sodium: 134 mEq/L — ABNORMAL LOW (ref 136–145)
Total Bilirubin: 0.47 mg/dL (ref 0.20–1.20)
Total Protein: 6.6 g/dL (ref 6.4–8.3)

## 2014-05-12 LAB — CBC WITH DIFFERENTIAL/PLATELET
BASO%: 0.7 % (ref 0.0–2.0)
Basophils Absolute: 0 10*3/uL (ref 0.0–0.1)
EOS ABS: 0 10*3/uL (ref 0.0–0.5)
EOS%: 0.2 % (ref 0.0–7.0)
HCT: 25.5 % — ABNORMAL LOW (ref 38.4–49.9)
HGB: 8.2 g/dL — ABNORMAL LOW (ref 13.0–17.1)
LYMPH#: 0.1 10*3/uL — AB (ref 0.9–3.3)
LYMPH%: 4.5 % — ABNORMAL LOW (ref 14.0–49.0)
MCH: 27 pg — ABNORMAL LOW (ref 27.2–33.4)
MCHC: 32.2 g/dL (ref 32.0–36.0)
MCV: 84 fL (ref 79.3–98.0)
MONO#: 0.4 10*3/uL (ref 0.1–0.9)
MONO%: 12.6 % (ref 0.0–14.0)
NEUT%: 82 % — ABNORMAL HIGH (ref 39.0–75.0)
NEUTROS ABS: 2.6 10*3/uL (ref 1.5–6.5)
Platelets: 380 10*3/uL (ref 140–400)
RBC: 3.04 10*6/uL — AB (ref 4.20–5.82)
RDW: 22.5 % — ABNORMAL HIGH (ref 11.0–14.6)
WBC: 3.2 10*3/uL — AB (ref 4.0–10.3)

## 2014-05-12 MED ORDER — IOHEXOL 300 MG/ML  SOLN
100.0000 mL | Freq: Once | INTRAMUSCULAR | Status: AC | PRN
  Administered 2014-05-12: 100 mL via INTRAVENOUS

## 2014-05-12 MED ORDER — IOHEXOL 300 MG/ML  SOLN
50.0000 mL | Freq: Once | INTRAMUSCULAR | Status: AC | PRN
  Administered 2014-05-12: 50 mL via ORAL

## 2014-05-13 ENCOUNTER — Encounter: Payer: Self-pay | Admitting: Oncology

## 2014-05-13 ENCOUNTER — Inpatient Hospital Stay (HOSPITAL_COMMUNITY): Payer: Medicaid Other | Admitting: Anesthesiology

## 2014-05-13 ENCOUNTER — Encounter (HOSPITAL_COMMUNITY): Payer: Self-pay | Admitting: *Deleted

## 2014-05-13 ENCOUNTER — Ambulatory Visit (HOSPITAL_BASED_OUTPATIENT_CLINIC_OR_DEPARTMENT_OTHER): Payer: Medicaid Other | Admitting: Nurse Practitioner

## 2014-05-13 ENCOUNTER — Encounter: Payer: Self-pay | Admitting: Nurse Practitioner

## 2014-05-13 ENCOUNTER — Telehealth: Payer: Self-pay | Admitting: Oncology

## 2014-05-13 ENCOUNTER — Other Ambulatory Visit: Payer: Self-pay | Admitting: *Deleted

## 2014-05-13 ENCOUNTER — Encounter (HOSPITAL_COMMUNITY)
Admission: AD | Disposition: A | Discharge status: Home Health | Payer: Self-pay | Source: Ambulatory Visit | Attending: Internal Medicine

## 2014-05-13 ENCOUNTER — Inpatient Hospital Stay (HOSPITAL_COMMUNITY)
Admission: AD | Admit: 2014-05-13 | Discharge: 2014-05-22 | DRG: 659 | Disposition: A | Discharge status: Home Health | Payer: Medicaid Other | Source: Ambulatory Visit | Attending: Internal Medicine | Admitting: Internal Medicine

## 2014-05-13 VITALS — BP 123/73 | HR 89 | Temp 98.3°F | Resp 18 | Ht 78.0 in | Wt 131.8 lb

## 2014-05-13 DIAGNOSIS — F1290 Cannabis use, unspecified, uncomplicated: Secondary | ICD-10-CM | POA: Diagnosis present

## 2014-05-13 DIAGNOSIS — Z91018 Allergy to other foods: Secondary | ICD-10-CM

## 2014-05-13 DIAGNOSIS — E43 Unspecified severe protein-calorie malnutrition: Secondary | ICD-10-CM | POA: Diagnosis present

## 2014-05-13 DIAGNOSIS — D638 Anemia in other chronic diseases classified elsewhere: Secondary | ICD-10-CM | POA: Diagnosis present

## 2014-05-13 DIAGNOSIS — N3289 Other specified disorders of bladder: Secondary | ICD-10-CM | POA: Diagnosis present

## 2014-05-13 DIAGNOSIS — N189 Chronic kidney disease, unspecified: Secondary | ICD-10-CM | POA: Diagnosis present

## 2014-05-13 DIAGNOSIS — R059 Cough, unspecified: Secondary | ICD-10-CM

## 2014-05-13 DIAGNOSIS — Z681 Body mass index (BMI) 19 or less, adult: Secondary | ICD-10-CM | POA: Diagnosis not present

## 2014-05-13 DIAGNOSIS — N151 Renal and perinephric abscess: Secondary | ICD-10-CM | POA: Diagnosis present

## 2014-05-13 DIAGNOSIS — D630 Anemia in neoplastic disease: Secondary | ICD-10-CM | POA: Diagnosis present

## 2014-05-13 DIAGNOSIS — Z87891 Personal history of nicotine dependence: Secondary | ICD-10-CM | POA: Diagnosis not present

## 2014-05-13 DIAGNOSIS — C20 Malignant neoplasm of rectum: Secondary | ICD-10-CM

## 2014-05-13 DIAGNOSIS — N131 Hydronephrosis with ureteral stricture, not elsewhere classified: Secondary | ICD-10-CM | POA: Diagnosis present

## 2014-05-13 DIAGNOSIS — Z9049 Acquired absence of other specified parts of digestive tract: Secondary | ICD-10-CM | POA: Diagnosis present

## 2014-05-13 DIAGNOSIS — N1330 Unspecified hydronephrosis: Secondary | ICD-10-CM

## 2014-05-13 DIAGNOSIS — T8389XA Other specified complication of genitourinary prosthetic devices, implants and grafts, initial encounter: Secondary | ICD-10-CM | POA: Diagnosis present

## 2014-05-13 DIAGNOSIS — N135 Crossing vessel and stricture of ureter without hydronephrosis: Secondary | ICD-10-CM

## 2014-05-13 DIAGNOSIS — Z79899 Other long term (current) drug therapy: Secondary | ICD-10-CM | POA: Diagnosis not present

## 2014-05-13 DIAGNOSIS — G893 Neoplasm related pain (acute) (chronic): Secondary | ICD-10-CM | POA: Diagnosis present

## 2014-05-13 DIAGNOSIS — J189 Pneumonia, unspecified organism: Secondary | ICD-10-CM | POA: Diagnosis not present

## 2014-05-13 DIAGNOSIS — Z933 Colostomy status: Secondary | ICD-10-CM | POA: Diagnosis not present

## 2014-05-13 DIAGNOSIS — IMO0002 Reserved for concepts with insufficient information to code with codable children: Secondary | ICD-10-CM

## 2014-05-13 DIAGNOSIS — R110 Nausea: Secondary | ICD-10-CM

## 2014-05-13 DIAGNOSIS — Z923 Personal history of irradiation: Secondary | ICD-10-CM

## 2014-05-13 DIAGNOSIS — R05 Cough: Secondary | ICD-10-CM

## 2014-05-13 DIAGNOSIS — N210 Calculus in bladder: Secondary | ICD-10-CM

## 2014-05-13 DIAGNOSIS — N281 Cyst of kidney, acquired: Secondary | ICD-10-CM

## 2014-05-13 HISTORY — PX: HOLMIUM LASER APPLICATION: SHX5852

## 2014-05-13 HISTORY — PX: CYSTOSCOPY WITH URETEROSCOPY: SHX5123

## 2014-05-13 LAB — CBC WITH DIFFERENTIAL/PLATELET
BASOS ABS: 0 10*3/uL (ref 0.0–0.1)
Basophils Relative: 0 % (ref 0–1)
EOS PCT: 0 % (ref 0–5)
Eosinophils Absolute: 0 10*3/uL (ref 0.0–0.7)
HCT: 23.5 % — ABNORMAL LOW (ref 39.0–52.0)
Hemoglobin: 7.5 g/dL — ABNORMAL LOW (ref 13.0–17.0)
LYMPHS ABS: 0.3 10*3/uL — AB (ref 0.7–4.0)
Lymphocytes Relative: 8 % — ABNORMAL LOW (ref 12–46)
MCH: 27.7 pg (ref 26.0–34.0)
MCHC: 31.9 g/dL (ref 30.0–36.0)
MCV: 86.7 fL (ref 78.0–100.0)
Monocytes Absolute: 0.4 10*3/uL (ref 0.1–1.0)
Monocytes Relative: 9 % (ref 3–12)
NEUTROS ABS: 3.4 10*3/uL (ref 1.7–7.7)
Neutrophils Relative %: 83 % — ABNORMAL HIGH (ref 43–77)
PLATELETS: 285 10*3/uL (ref 150–400)
RBC: 2.71 MIL/uL — ABNORMAL LOW (ref 4.22–5.81)
RDW: 22.1 % — AB (ref 11.5–15.5)
WBC: 4.1 10*3/uL (ref 4.0–10.5)

## 2014-05-13 LAB — COMPREHENSIVE METABOLIC PANEL
ALT: 12 U/L (ref 0–53)
ANION GAP: 7 (ref 5–15)
AST: 18 U/L (ref 0–37)
Albumin: 2.7 g/dL — ABNORMAL LOW (ref 3.5–5.2)
Alkaline Phosphatase: 74 U/L (ref 39–117)
BILIRUBIN TOTAL: 0.6 mg/dL (ref 0.3–1.2)
BUN: 16 mg/dL (ref 6–23)
CO2: 29 mmol/L (ref 19–32)
Calcium: 9 mg/dL (ref 8.4–10.5)
Chloride: 96 mEq/L (ref 96–112)
Creatinine, Ser: 1.15 mg/dL (ref 0.50–1.35)
GFR calc non Af Amer: 69 mL/min — ABNORMAL LOW (ref >90)
GFR, EST AFRICAN AMERICAN: 80 mL/min — AB (ref >90)
GLUCOSE: 107 mg/dL — AB (ref 70–99)
POTASSIUM: 3.9 mmol/L (ref 3.5–5.1)
SODIUM: 132 mmol/L — AB (ref 135–145)
Total Protein: 6.2 g/dL (ref 6.0–8.3)

## 2014-05-13 LAB — PHOSPHORUS: Phosphorus: 3.2 mg/dL (ref 2.3–4.6)

## 2014-05-13 LAB — MAGNESIUM: Magnesium: 1.9 mg/dL (ref 1.5–2.5)

## 2014-05-13 LAB — SURGICAL PCR SCREEN
MRSA, PCR: NEGATIVE
Staphylococcus aureus: NEGATIVE

## 2014-05-13 SURGERY — CYSTOSCOPY WITH URETEROSCOPY
Anesthesia: General | Site: Ureter | Laterality: Right

## 2014-05-13 MED ORDER — ENOXAPARIN SODIUM 40 MG/0.4ML ~~LOC~~ SOLN
40.0000 mg | SUBCUTANEOUS | Status: DC
  Administered 2014-05-14 – 2014-05-21 (×8): 40 mg via SUBCUTANEOUS
  Filled 2014-05-13 (×10): qty 0.4

## 2014-05-13 MED ORDER — FENTANYL CITRATE 0.05 MG/ML IJ SOLN
INTRAMUSCULAR | Status: AC
  Filled 2014-05-13: qty 5

## 2014-05-13 MED ORDER — SODIUM CHLORIDE 0.9 % IV SOLN
INTRAVENOUS | Status: DC
  Administered 2014-05-13 (×2): via INTRAVENOUS
  Administered 2014-05-14: 1000 mL via INTRAVENOUS
  Administered 2014-05-14 – 2014-05-15 (×2): via INTRAVENOUS
  Administered 2014-05-16: 1000 mL via INTRAVENOUS
  Administered 2014-05-16 – 2014-05-18 (×5): via INTRAVENOUS

## 2014-05-13 MED ORDER — LIDOCAINE HCL (CARDIAC) 20 MG/ML IV SOLN
INTRAVENOUS | Status: DC | PRN
  Administered 2014-05-13: 50 mg via INTRAVENOUS

## 2014-05-13 MED ORDER — LACTATED RINGERS IV SOLN: INTRAVENOUS | Status: DC

## 2014-05-13 MED ORDER — ONDANSETRON HCL 4 MG PO TABS: 4.0000 mg | ORAL_TABLET | Freq: Four times a day (QID) | ORAL | Status: DC | PRN

## 2014-05-13 MED ORDER — SODIUM CHLORIDE 0.9 % IR SOLN
Status: DC | PRN
  Administered 2014-05-13 (×3): 3000 mL

## 2014-05-13 MED ORDER — HYDROMORPHONE HCL 2 MG/ML IJ SOLN
INTRAMUSCULAR | Status: AC
  Filled 2014-05-13: qty 1

## 2014-05-13 MED ORDER — HYDROMORPHONE HCL 1 MG/ML IJ SOLN
INTRAMUSCULAR | Status: AC
  Filled 2014-05-13: qty 1

## 2014-05-13 MED ORDER — SPIRONOLACTONE 25 MG PO TABS
25.0000 mg | ORAL_TABLET | Freq: Every day | ORAL | Status: DC | PRN
  Filled 2014-05-13: qty 1

## 2014-05-13 MED ORDER — PROPOFOL 10 MG/ML IV BOLUS
INTRAVENOUS | Status: DC | PRN
  Administered 2014-05-13: 160 mg via INTRAVENOUS

## 2014-05-13 MED ORDER — ONDANSETRON HCL 4 MG PO TABS: 4.0000 mg | ORAL_TABLET | Freq: Three times a day (TID) | ORAL | Status: DC | PRN

## 2014-05-13 MED ORDER — HYDROMORPHONE HCL 1 MG/ML IJ SOLN
INTRAMUSCULAR | Status: DC | PRN
  Administered 2014-05-13: 0.5 mg via INTRAVENOUS
  Administered 2014-05-13: 1 mg via INTRAVENOUS
  Administered 2014-05-13: 0.5 mg via INTRAVENOUS

## 2014-05-13 MED ORDER — HYDROMORPHONE HCL 1 MG/ML IJ SOLN
0.2500 mg | INTRAMUSCULAR | Status: DC | PRN
  Administered 2014-05-13 (×2): 0.5 mg via INTRAVENOUS

## 2014-05-13 MED ORDER — LIDOCAINE HCL 2 % EX GEL
CUTANEOUS | Status: DC | PRN
  Administered 2014-05-13: 1 via TOPICAL

## 2014-05-13 MED ORDER — SUCCINYLCHOLINE CHLORIDE 20 MG/ML IJ SOLN
INTRAMUSCULAR | Status: DC | PRN
  Administered 2014-05-13: 100 mg via INTRAVENOUS

## 2014-05-13 MED ORDER — FENTANYL CITRATE 0.05 MG/ML IJ SOLN
INTRAMUSCULAR | Status: AC
  Filled 2014-05-13: qty 2

## 2014-05-13 MED ORDER — MIDAZOLAM HCL 5 MG/5ML IJ SOLN
INTRAMUSCULAR | Status: DC | PRN
  Administered 2014-05-13: 2 mg via INTRAVENOUS

## 2014-05-13 MED ORDER — ONDANSETRON HCL 4 MG/2ML IJ SOLN
INTRAMUSCULAR | Status: DC | PRN
  Administered 2014-05-13: 4 mg via INTRAVENOUS

## 2014-05-13 MED ORDER — PROMETHAZINE HCL 25 MG/ML IJ SOLN: 6.2500 mg | INTRAMUSCULAR | Status: DC | PRN

## 2014-05-13 MED ORDER — FENTANYL CITRATE 0.05 MG/ML IJ SOLN
INTRAMUSCULAR | Status: AC
Start: 2014-05-13 — End: 2014-05-13
  Administered 2014-05-13: 50 ug via INTRAVENOUS
  Filled 2014-05-13: qty 2

## 2014-05-13 MED ORDER — ONDANSETRON HCL 4 MG/2ML IJ SOLN
INTRAMUSCULAR | Status: AC
  Filled 2014-05-13: qty 2

## 2014-05-13 MED ORDER — OXYCODONE HCL 10 MG PO TABS: 5.0000 mg | ORAL_TABLET | ORAL | Status: DC | PRN

## 2014-05-13 MED ORDER — IOHEXOL 300 MG/ML  SOLN
INTRAMUSCULAR | Status: DC | PRN
  Administered 2014-05-13: 5 mL via URETHRAL

## 2014-05-13 MED ORDER — HYDROMORPHONE HCL 1 MG/ML IJ SOLN
1.0000 mg | INTRAMUSCULAR | Status: DC | PRN
  Administered 2014-05-13 – 2014-05-14 (×6): 1 mg via INTRAVENOUS
  Filled 2014-05-13 (×7): qty 1

## 2014-05-13 MED ORDER — MORPHINE SULFATE 2 MG/ML IJ SOLN
INTRAMUSCULAR | Status: AC
  Filled 2014-05-13: qty 1

## 2014-05-13 MED ORDER — ONDANSETRON HCL 4 MG/2ML IJ SOLN: 4.0000 mg | Freq: Four times a day (QID) | INTRAMUSCULAR | Status: DC | PRN

## 2014-05-13 MED ORDER — ALPRAZOLAM 0.5 MG PO TABS
0.5000 mg | ORAL_TABLET | Freq: Two times a day (BID) | ORAL | Status: DC | PRN
  Administered 2014-05-14 – 2014-05-22 (×12): 0.5 mg via ORAL
  Filled 2014-05-13 (×12): qty 1

## 2014-05-13 MED ORDER — FENTANYL CITRATE 0.05 MG/ML IJ SOLN
25.0000 ug | INTRAMUSCULAR | Status: DC | PRN
  Administered 2014-05-13 – 2014-05-15 (×5): 50 ug via INTRAVENOUS
  Filled 2014-05-13 (×2): qty 2

## 2014-05-13 MED ORDER — MORPHINE SULFATE 2 MG/ML IJ SOLN
1.0000 mg | INTRAMUSCULAR | Status: DC | PRN
  Administered 2014-05-13: 1 mg via INTRAVENOUS

## 2014-05-13 MED ORDER — MEPERIDINE HCL 50 MG/ML IJ SOLN: 6.2500 mg | INTRAMUSCULAR | Status: DC | PRN

## 2014-05-13 MED ORDER — SODIUM CHLORIDE 0.9 % IV SOLN
INTRAVENOUS | Status: DC
  Administered 2014-05-13: 16:00:00 via INTRAVENOUS

## 2014-05-13 MED ORDER — PANTOPRAZOLE SODIUM 40 MG PO TBEC
40.0000 mg | DELAYED_RELEASE_TABLET | Freq: Every day | ORAL | Status: DC
  Administered 2014-05-14 – 2014-05-22 (×9): 40 mg via ORAL
  Filled 2014-05-13 (×11): qty 1

## 2014-05-13 MED ORDER — LIDOCAINE HCL 2 % EX GEL
CUTANEOUS | Status: AC
  Filled 2014-05-13: qty 10

## 2014-05-13 MED ORDER — FENTANYL CITRATE 0.05 MG/ML IJ SOLN
INTRAMUSCULAR | Status: DC | PRN
  Administered 2014-05-13 (×2): 50 ug via INTRAVENOUS
  Administered 2014-05-13: 100 ug via INTRAVENOUS
  Administered 2014-05-13: 50 ug via INTRAVENOUS

## 2014-05-13 MED ORDER — MIDAZOLAM HCL 2 MG/2ML IJ SOLN
INTRAMUSCULAR | Status: AC
  Filled 2014-05-13: qty 2

## 2014-05-13 MED ORDER — PROPOFOL 10 MG/ML IV BOLUS
INTRAVENOUS | Status: AC
Start: 2014-05-13 — End: 2014-05-13
  Filled 2014-05-13: qty 20

## 2014-05-13 MED ORDER — ALPRAZOLAM 0.5 MG PO TABS: 0.5000 mg | ORAL_TABLET | Freq: Two times a day (BID) | ORAL | Status: DC | PRN

## 2014-05-13 MED ORDER — GENTAMICIN SULFATE 40 MG/ML IJ SOLN
240.0000 mg | INTRAVENOUS | Status: AC
  Administered 2014-05-13: 240 mg via INTRAVENOUS
  Filled 2014-05-13: qty 6

## 2014-05-13 MED ORDER — LIDOCAINE HCL (CARDIAC) 20 MG/ML IV SOLN
INTRAVENOUS | Status: AC
  Filled 2014-05-13: qty 5

## 2014-05-13 MED ORDER — CEFTRIAXONE SODIUM IN DEXTROSE 20 MG/ML IV SOLN
1.0000 g | INTRAVENOUS | Status: DC
  Administered 2014-05-13 – 2014-05-18 (×6): 1 g via INTRAVENOUS
  Filled 2014-05-13 (×7): qty 50

## 2014-05-13 SURGICAL SUPPLY — 26 items
BAG URINE DRAINAGE (UROLOGICAL SUPPLIES) ×4 IMPLANT
BAG URO CATCHER STRL LF (DRAPE) ×4 IMPLANT
BASKET ZERO TIP NITINOL 2.4FR (BASKET) IMPLANT
CATH FOLEY 3WAY 30CC 22FR (CATHETERS) ×4 IMPLANT
CATH INTERMIT  6FR 70CM (CATHETERS) ×4 IMPLANT
CLOTH BEACON ORANGE TIMEOUT ST (SAFETY) ×4 IMPLANT
CONT SPEC 4OZ CLIKSEAL STRL BL (MISCELLANEOUS) ×4 IMPLANT
DRAPE CAMERA CLOSED 9X96 (DRAPES) ×4 IMPLANT
FIBER LASER FLEXIVA 1000 (UROLOGICAL SUPPLIES) ×4 IMPLANT
FIBER LASER FLEXIVA 200 (UROLOGICAL SUPPLIES) IMPLANT
FIBER LASER FLEXIVA 365 (UROLOGICAL SUPPLIES) IMPLANT
FIBER LASER TRAC TIP (UROLOGICAL SUPPLIES) IMPLANT
GLOVE BIOGEL M STRL SZ7.5 (GLOVE) ×4 IMPLANT
GOWN STRL REUS W/ TWL XL LVL3 (GOWN DISPOSABLE) ×2 IMPLANT
GOWN STRL REUS W/TWL LRG LVL3 (GOWN DISPOSABLE) ×8 IMPLANT
GOWN STRL REUS W/TWL XL LVL3 (GOWN DISPOSABLE) ×2
GUIDEWIRE ANG ZIPWIRE 038X150 (WIRE) IMPLANT
GUIDEWIRE STR DUAL SENSOR (WIRE) ×4 IMPLANT
MANIFOLD NEPTUNE II (INSTRUMENTS) ×4 IMPLANT
PACK CYSTO (CUSTOM PROCEDURE TRAY) ×4 IMPLANT
PLUG CATH AND CAP STER (CATHETERS) ×4 IMPLANT
STENT CONTOUR 8FR X 28 (STENTS) ×4 IMPLANT
SYR 30ML LL (SYRINGE) ×4 IMPLANT
SYRINGE IRR TOOMEY STRL 70CC (SYRINGE) ×4 IMPLANT
TUBING CONNECTING 10 (TUBING) ×3 IMPLANT
TUBING CONNECTING 10' (TUBING) ×1

## 2014-05-13 NOTE — Anesthesia Preprocedure Evaluation (Signed)
Anesthesia Evaluation  Patient identified by MRN, date of birth, ID band Patient awake    Reviewed: Allergy & Precautions, H&P , NPO status , Patient's Chart, lab work & pertinent test results  Airway Mallampati: II  TM Distance: >3 FB     Dental  (+) Chipped, Dental Advisory Given, Poor Dentition, Caps   Pulmonary former smoker,  breath sounds clear to auscultation        Cardiovascular negative cardio ROS      Neuro/Psych    GI/Hepatic negative GI ROS, Neg liver ROS,   Endo/Other  negative endocrine ROS  Renal/GU negative Renal ROS     Musculoskeletal   Abdominal Normal abdominal exam  (+)   Peds  Hematology  (+) anemia ,   Anesthesia Other Findings   Reproductive/Obstetrics                             Anesthesia Physical  Anesthesia Plan  ASA: III  Anesthesia Plan: General   Post-op Pain Management:    Induction: Intravenous  Airway Management Planned: Oral ETT and Video Laryngoscope Planned  Additional Equipment:   Intra-op Plan:   Post-operative Plan: Extubation in OR  Informed Consent: I have reviewed the patients History and Physical, chart, labs and discussed the procedure including the risks, benefits and alternatives for the proposed anesthesia with the patient or authorized representative who has indicated his/her understanding and acceptance.   Dental advisory given  Plan Discussed with: Anesthesiologist  Anesthesia Plan Comments:         Anesthesia Quick Evaluation

## 2014-05-13 NOTE — Transfer of Care (Signed)
Immediate Anesthesia Transfer of Care Note  Patient: Grant [name]  Procedure(s) Performed: Procedure(s) with comments: CYSTOSCOPY WITH URETEROSCOPY (Right) - CYSTOSCOPY/ LITHOPAXY/RIGHT URETERAL STENT EXCHANGE HOLMIUM LASER APPLICATION (N/A)  Patient Location: PACU  Anesthesia Type:General  Level of Consciousness: awake, alert  and oriented  Airway & Oxygen Therapy: Patient Spontanous Breathing and Patient connected to face mask oxygen  Post-op Assessment: Report given to PACU RN and Post -op Vital signs reviewed and stable  Post vital signs: Reviewed and stable  Complications: No apparent anesthesia complications

## 2014-05-13 NOTE — Op Note (Addendum)
Preoperative diagnosis:  1. Encrusted right ureteral stent 2. Right renal parenchymal abscess   Postoperative diagnosis:  1. As above   Procedure: 1. cystolithalopaxy > 2 cm 2. Complicated right ureteral stent exchange 3. Retrograde pyelogram with interpretation.  Surgeon: Ardis Hughs, MD  Anesthesia: General  Complications: None  Intraoperative findings: Patient was given 1 gm of Ceftriaxone and 240mg  of Gent prior to the procedure. Stone lasered off the distal end of the stent and stone fragments removed.  Patient taken to OR and stone removed from bladder. Stent was exchanged for a 10F x 28cm stent. 50 cc of thick/purulent/foul smelling urine was drained from renal pelvis. The retrograde was performed revealing no significant hydronephrosis. However, given the thick and purulent aspirate I was reluctant to inject too much contrast creating back pressure and bacteremia. The ureter did seem to be of normal caliber. The aspirate was sent to the lab for culture. 70F foley catheter was left at the end of the case.  EBL: Minimal  Specimens: Bladder stones, bladder urine for culture, right renal pelvic urine for culture  Indication:   Unfortunate 58 year old male with a history of advanced rectal cancer with right hydro-ureter and hydronephrosis secondary to extrinsic compression who presents today with a bladder stone which appears to be at the distal curl of the ureteral stent, placed in May 2015, and a right renal abscess. The patient has been undergoing chemotherapy and underwent a CT scan for surveillance of his disease were the above was noted. The patient tells me that he has significant right flank pain which has been progressive but worse over the past several days. He also states that he has had a very low-grade fever. Further, the patient states that he has been voiding blood clots for several months as well as what he describes as fecal matter. The patient states that he  was diagnosed with a vesico-colonic fistula, But has not received any treatment for this. He does have a proximal diverting colostomy. Initially, the patient was treated for his hydro-ureter with a nephrostomy tube which was subsequently converted to a double-J stent in May 2015. He was then lost to follow-up and has not had his stent changed in the interim.   After reviewing the management options for treatment, he elected to proceed with the above surgical procedure(s). We have discussed the potential benefits and risks of the procedure, side effects of the proposed treatment, the likelihood of the patient achieving the goals of the procedure, and any potential problems that might occur during the procedure or recuperation. I nformed consent has been obtained.  Description of procedure:  The patient was taken to the operating room and general anesthesia was induced.  The patient was placed in the dorsal lithotomy position, prepped and draped in the usual sterile fashion, and preoperative antibiotics were administered. A preoperative time-out was performed.   Then gently inserted the 22 French 30 cystoscope into the patient's bladder under visual guidance. The bladder was cloudy with tissue and debris floating around the bladder making visualization difficult. The bladder was cycled several times to clear the debris and allow better visualization. Ultimately I was able to determine that the large tumor mass centimeter stone was incorporated within the distal curl of the stent within the bladder. Then using 1000  fiber with settings of 20 Hz and 0.8 J I was able to fragment the bladder stone into small pieces and remove the stone in its entirety from off of the stent.  I  then passed a 0.38 sensor wire alongside the stent and up into the right renal pelvis using fluoroscopic guidance. I then grasped the stent at the distal aspect and gently pulled it out of the bladder and to the urethral meatus using  stent graspers. This was done carefully and under fluoroscopic guidance to ensure that the proximal end would in fact uncurl and also easily,. The stent with only a small amount of force was able to be removed leaving the wire alongside the stent remaining inside the renal pelvis.  This point a 94 [place] open-ended ureteral catheter was then advanced over the wire and into the right renal pelvis. Prior to placing contrast into the right renal pelvis I aspirated a proximally 50 mL of foul smelling/thick and purulent fluid from the renal pelvis. I then injected 5 mL of Omnipaque contrast gently through the catheter and into the right renal pelvis. I then was able to pass an 8 [place] 28 cm double-J ureteral stent up the ureter with minimal resistance and place it nicely in the upper pole of the right kidney. Stent was then deployed in the bladder and a curl noted at the right ureteral orifice.  I then dilated up the urethral meatus to 28 [place] and passed a 28 French resectoscope sheath into the bladder using the obturator. The operator was exchanged for the resectoscope without the coagulating device. I was then able to remove the stone fragments through the larger sheath much easier. After all the fragments and clot had been removed I was able to get a fairly good look at the patient's bladder which revealed erythematous change but no evidence of tumor growth into the bladder.  I then removed the resectoscope and passed a 22 French 3-way Foley catheter into the patient's bladder. I injected 15 mL of sterile water into the balloon. The effluent was pink and clear, and as such I was able to cap the inflow port and leave the Foley to straight drainage.  The patient was subsequently extubated and returned to the PACU in stable condition. The patient tolerated the procedure well. There were no anesthetic complications and the patient remained hemodynamically stable throughout.   Ardis Hughs, M.D.

## 2014-05-13 NOTE — Progress Notes (Signed)
Spoke with dr Louis Meckel about post op orders, he would like the hospitalists to take care of the post op orders.

## 2014-05-13 NOTE — Assessment & Plan Note (Signed)
Patient is status post initial oral Xeloda and radiation therapy completed on 01/29/2014.  Patient initiated third cycle of his CAPOX chemotherapy regimen (which consist of both oral Xeloda and oxaliplatin chemotherapy) on 04/24/2014.  He is scheduled to initiate cycle 4 of the same regimen tomorrow 05/14/2014.  Due to admission to the hospital this afternoon for further evaluation and care of obstructing kidney stone and possible kidney abscess-all chemotherapy will be held for one week.  Advised patient and his family would contact them regarding rescheduling of his next chemotherapy.

## 2014-05-13 NOTE — Assessment & Plan Note (Signed)
Restaging scan obtained today revealed moderate right sided hydronephrosis with an ill-defined cystic lesion to the right kidney.  Interval development of a 3.3 cm intraluminal latter stone  around the formed loop of the right ureteral catheter.  Questionable right intrarenal abscess noted.  Patient will be direct admitted to the floor per hospitalist.  Gave brief report and history to hospitalist Dr. Doyle Askew prior to patient being transported to the floor for admission.  Also, spoke directly to Dr. Louis Meckel urologist.  Dr. Louis Meckel advised that patient should remain nothing by mouth for probable surgical procedure later this evening.  Called interventional radiology to advise of pending interventional radiology consult for possible abscess drain placement.

## 2014-05-13 NOTE — Telephone Encounter (Signed)
Per 12/29 POF pt to see NP/CB cancelled MD for 12/30.Marland KitchenMarland KitchenMarland KitchenMarland Kitchen Pt is aware

## 2014-05-13 NOTE — H&P (Signed)
[facility] History and Physical  Grant [name] TFT:[id number] DOB: 11/19/1955 DOA: 05/13/2014  Referring physician: ER physician PCP: MATTHEWS,MICHELLE A., MD   Chief Complaint: right flank pain   HPI:  58 year old male with past medical history of rectal cancer (had initiation of radiation and Xeloda 12/18/2013, completed 01/29/2014), restaging CT evaluation at [facility] showed decrease in size of the rectosigmoid mass post radiation changes, has had chemotherapy with Capox starting 03/13/2014 (cycle 3 given 04/24/2014), history of right hydronephrosis secondary to the obstructing pelvic mass, status post placement of a right percutaneous nephrostomy tube followed by a right ureter stent, 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 Patient presented as a direct admission from cancer center. Patient presented to [facility], has seen Dr. Benay Spice because of hematuria. He is staging CT scan  done 05/13/2014 showed obstructed right kidney due to stone and possible right renal abscess. Patient has had complaints of hematuria in addition to right flank pain progressively worsening over past couple of days prior to this admission. He also reported associated low-grade fevers at home. No nausea or vomiting. No other complaints such as chest pain, shortness of breath or palpitations. No lightheadedness or loss of consciousness. Vital signs on admission are stable. Blood work revealed white blood cell count of 3.2, hemoglobin 8.2, normal creatinine. Patient was seen by urology in consultation and plan was for cystoscopy and attempt at the right ureteral stent exchange. He will also likely need drainage of the renal abscess per interventional radiology.  Assessment & Plan    Principal Problem:   Ureteral obstruction, right / right hydronephrosis / right renal abscess  Appreciate urology consult and recommendations. Plan is for cystoscopy and attempt at the right ureteral  stent exchange.  IR consulted for percutaneous drainage for renal abscess.  Patient was placed on Rocephin IV every 24 hours.  Continue supportive care with IV fluids, analgesia as needed. Active Problems:   Anemia of chronic disease  Secondary to history of malignancy.  Hemoglobin is 8.2 on the admission. Follow-up CBC daily.   Severe protein-calorie malnutrition  In the context of chronic illness. Nutrition consulted.   CA of rectum  Management per oncology.   DVT prophylaxis:   Lovenox SQ ordered   Radiological Exams on Admission: Ct Abdomen Pelvis W Contrast 05/13/2014   ADDENDUM REPORT: 05/13/2014 09:41  ADDENDUM: I discussed the results of this study by telephone with Dr. Benay Spice at approximately 0845 hours. Dr. Benay Spice will be seeing the patient in clinic tomorrow. After that discussion, I also spoke with Dr. Louis Meckel with Urology, who was covering for Dr. Alinda Money. Dr. Carlton Adam office will contact the patient ASAP in order to arrange definitive management for the obstructed right kidney and probable right renal phlegmon/abscess.   Electronically Signed   By: Misty Stanley M.D.   On: 05/13/2014 09:41  05/13/2014  Moderate right-sided hydronephrosis with a 4.8 cm ill-defined cystic lesion in the anterior right kidney, generated mass-effect on the renal pelvis and intrarenal collecting system. This does not appear to be related to a dilated phalanx and intrarenal abscess would be indistinct consideration.  Interval development of a 3.3 cm intraluminal bladder stone around the formed loop of the right ureteral catheter.  Interval decrease in size of the large irregular rectal mass involving the pelvic floor.  Electronically Signed: By: Misty Stanley M.D. On: 05/13/2014 08:34    EKG: pending   Code Status: Full Family Communication: Plan of care discussed with the patient  Disposition Plan: Admit  for further evaluation  Leisa Lenz, MD  [facility] Pager  [phone]  Review of Systems:  Constitutional: Negative for fever, chills and malaise/fatigue. Negative for diaphoresis.  HENT: Negative for hearing loss, ear pain, nosebleeds, congestion, sore throat, neck pain, tinnitus and ear discharge.   Eyes: Negative for blurred vision, double vision, photophobia, pain, discharge and redness.  Respiratory: Negative for cough, hemoptysis, sputum production, shortness of breath, wheezing and stridor.   Cardiovascular: Negative for chest pain, palpitations, orthopnea, claudication and leg swelling.  Gastrointestinal: Negative for nausea, vomiting and abdominal pain. Negative for heartburn, constipation, blood in stool and melena.  Genitourinary: Negative for dysuria, urgency, frequency, hematuria and flank pain.  Musculoskeletal: Negative for myalgias, back pain, joint pain and falls.  Skin: Negative for itching and rash.  Neurological: Negative for dizziness and weakness. Negative for tingling, tremors, sensory change, speech change, focal weakness, loss of consciousness and headaches.  Endo/Heme/Allergies: Negative for environmental allergies and polydipsia. Does not bruise/bleed easily.  Psychiatric/Behavioral: Negative for suicidal ideas. The patient is not nervous/anxious.      Past Medical History  Diagnosis Date  . Fecal incontinence   . 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 Cancer 10/11/13    rectal cancer-adenocarcinoma  . Chronic kidney disease     right stent  . Hx of radiation therapy 12/18/13-01/29/14    RECTAL / 55gy   Past Surgical History  Procedure Laterality Date  . Mandible surgery  1983  . Colonoscopy N/A 10/11/2013    Procedure: COLONOSCOPY;  Surgeon: [name], MD;  Location: [facility];  Service: Endoscopy;  Laterality: N/A;  . Colon resection N/A 10/15/2013    Procedure: LAP ASSISTED  LOOP COLOSTOMY;  Surgeon: Harl Bowie, MD;  Location: [facility];  Service: General;  Laterality: N/A;  . Nephrostomy  tube Right 10/18/13    with stent ,tube removed   Social History:  reports that he quit smoking about 26 years ago. His smokeless tobacco use includes Snuff. He reports that he drinks alcohol. He reports that he uses illicit drugs (Marijuana).  Allergies  Allergen Reactions  . Strawberry Anaphylaxis and Hives  . Sunflower Oil Anaphylaxis and Hives    seeds  . Watermelon [Citrullus Vulgaris] Anaphylaxis and Hives  . Other     All Melons cantaloupes, etc    Family History:  Family History  Problem Relation Age of Onset  . Cancer Father     pancreatic   . Cancer Maternal Uncle      Prior to Admission medications   Medication Sig Start Date End Date Taking? Authorizing Provider  ALPRAZolam Duanne Moron) 0.5 MG tablet Take 1 tablet (0.5 mg total) by mouth 2 (two) times daily as needed for anxiety. 05/13/14  Yes Drue Second, NP  Bisacodyl (DULCOLAX PO) Take 1 tablet by mouth 2 (two) times daily as needed (constipation/laxative.).   Yes Historical Provider, MD  capecitabine (XELODA) 500 MG tablet Takes 2000 mg in am and 1500 mg in pm X 14 days, 7 day rest. Start date 04/03/14. 04/24/14  Yes [name], MD  ferrous sulfate 325 (65 FE) MG tablet Take 1 tablet (325 mg total) by mouth 2 (two) times daily with a meal. 10/20/13  Yes Nishant Dhungel, MD  lactose free nutrition (BOOST PLUS) LIQD Take 237 mLs by mouth. From four to eight times daily.   Yes Historical Provider, MD  lidocaine-prilocaine (EMLA) cream Apply 1 application topically as needed. Apply to [facility]  site 1-2 hours prior to stick and cover with plastic wrap 03/12/14  Yes [name], MD  Multiple Vitamins-Minerals ([employer] SPECTRAVITE ULTRA MENS) TABS Take 1 tablet by mouth every morning.  10/20/13  Yes Historical Provider, MD  omeprazole (PRILOSEC) 20 MG capsule Take 1 capsule (20 mg total) by mouth daily. 04/21/14  Yes Leana Gamer, MD  ondansetron (ZOFRAN) 4 MG tablet Take 1 tablet (4 mg total) by mouth every 8 (eight) hours as  needed for nausea or vomiting. 05/13/14  Yes Drue Second, NP  Oxycodone HCl 10 MG TABS Take 0.5-1 tablets (5-10 mg total) by mouth every 4 (four) hours as needed (pain.). Patient taking differently: Take 15 mg by mouth every 4 (four) hours as needed (pain.).  05/13/14  Yes Drue Second, NP  polyethylene glycol (MIRALAX / GLYCOLAX) packet Take 17 g by mouth daily as needed for mild constipation.   Yes Historical Provider, MD  spironolactone (ALDACTONE) 25 MG tablet Take 25 mg by mouth daily as needed (swelling).   Yes Historical Provider, MD  prochlorperazine (COMPAZINE) 10 MG tablet Take 1 tablet (10 mg total) by mouth every 6 (six) hours as needed for nausea. Patient not taking: Reported on 05/13/2014 03/12/14   [name], MD   Physical Exam: Filed Vitals:   05/13/14 1502  BP: 112/69  Pulse: 84  Temp: 98.3 F (36.8 C)  TempSrc: Oral  Resp: 16  Height: 6\' 6"  (1.981 m)  Weight: 58.65 kg (129 lb 4.8 oz)  SpO2: 100%    Physical Exam  Constitutional: Appears well-developed and well-nourished. No distress.  HENT: Normocephalic. No tonsillar erythema or exudates Eyes: Conjunctivae and EOM are normal. PERRLA, no scleral icterus.  Neck: Normal ROM. Neck supple. No JVD. No tracheal deviation. No thyromegaly.  CVS: RRR, S1/S2 +, no murmurs, no gallops, no carotid bruit.  Pulmonary: Effort and breath sounds normal, no stridor, rhonchi, wheezes, rales.  Abdominal: Soft. BS +,  Right flank tenderness, low abdominal pain, no rebound.  Musculoskeletal: Normal range of motion. No edema and no tenderness.  Lymphadenopathy: No lymphadenopathy noted, cervical, inguinal. Neuro: Alert. Normal reflexes, muscle tone coordination. No focal neurologic deficits. Skin: Skin is warm and dry. No rash noted.  No erythema. No pallor.  Psychiatric: Normal mood and affect. Behavior, judgment, thought content normal.   Labs on Admission:  Basic Metabolic Panel:  Recent Labs Lab 05/12/14 1508  NA  134*  K 4.5  CO2 32*  GLUCOSE 148*  BUN 15.7  CREATININE 1.2  CALCIUM 9.5   Liver Function Tests:  Recent Labs Lab 05/12/14 1508  AST 16  ALT 13  ALKPHOS 85  BILITOT 0.47  PROT 6.6  ALBUMIN 2.5*   CBC:  Recent Labs Lab 05/12/14 1508  WBC 3.2*  NEUTROABS 2.6  HGB 8.2*  HCT 25.5*  MCV 84.0  PLT 380    If 7PM-7AM, please contact night-coverage [email or web address] Password [facility] 05/13/2014, 4:46 PM

## 2014-05-13 NOTE — Progress Notes (Signed)
will   SYMPTOM MANAGEMENT CLINIC   HPI: Grant [name] 58 y.o. male diagnosed with rectal cancer.  Patient is status post initial cycle of both oral Xeloda therapy and radiation therapy completed on 01/29/2014.  Currently undergoing  CAPOX chemotherapy regimen.   Patient presented to the [facility] today for review of his restaging scans obtained earlier this morning.  Patient is complaining of continued, chronic hematuria; stating that he does pass multiple clots throughout the day.  He is also complaining of some increased right flank pain as well.  He continues to complain of some mild, chronic nausea; is requesting a refill of his Zofran today.  He is also requesting a refill of both his oxycodone and Xanax today.  Patient states that his ostomy bag continues with adequate output.  He states that his stool remains fairly dark; since he takes iron tablets.  He denies any recent fevers or chills.    HPI  CURRENT THERAPY: Upcoming Treatment Dates - COLORECTAL Xelox (Capeox) q21d Days with orders from any treatment category:  05/14/2014      SCHEDULING COMMUNICATION      palonosetron (ALOXI) injection 0.25 mg      dexamethasone (DECADRON) injection 10 mg      oxaliplatin (ELOXATIN) 185 mg in dextrose 5 % 500 mL chemo infusion      sodium chloride 0.9 % injection 10 mL      heparin lock flush 100 unit/mL      heparin lock flush 100 unit/mL      alteplase (CATHFLO ACTIVASE) injection 2 mg      sodium chloride 0.9 % injection 3 mL      dextrose 5 % solution      TREATMENT CONDITIONS 06/04/2014      SCHEDULING COMMUNICATION      palonosetron (ALOXI) injection 0.25 mg      dexamethasone (DECADRON) injection 10 mg      oxaliplatin (ELOXATIN) 185 mg in dextrose 5 % 500 mL chemo infusion      sodium chloride 0.9 % injection 10 mL      heparin lock flush 100 unit/mL      heparin lock flush 100 unit/mL      alteplase (CATHFLO ACTIVASE) injection 2 mg      sodium chloride 0.9 %  injection 3 mL      dextrose 5 % solution      TREATMENT CONDITIONS    ROS  Past Medical History  Diagnosis Date  . Fecal incontinence   . 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 Cancer 10/11/13    rectal cancer-adenocarcinoma  . Chronic kidney disease     right stent  . Hx of radiation therapy 12/18/13-01/29/14    RECTAL / 55gy    Past Surgical History  Procedure Laterality Date  . Mandible surgery  1983  . Colonoscopy N/A 10/11/2013    Procedure: COLONOSCOPY;  Surgeon: [name], MD;  Location: [facility];  Service: Endoscopy;  Laterality: N/A;  . Colon resection N/A 10/15/2013    Procedure: LAP ASSISTED  LOOP COLOSTOMY;  Surgeon: Harl Bowie, MD;  Location: [facility];  Service: General;  Laterality: N/A;  . Nephrostomy tube Right 10/18/13    with stent ,tube removed    has Colovesical fistula; Ureteral obstruction, right; Anemia of chronic disease; Severe protein-calorie malnutrition; Microcytic anemia; Rectal mass; Rectal cancer; Dehydration; Diarrhea; Hyponatremia; CA of rectum; Other malaise and fatigue; Pain in male perineum; Chewing  tobacco nicotine dependence without complication; Renal cyst; Neoplasm related pain; and Nausea without vomiting on his problem list.     is allergic to strawberry; sunflower oil; watermelon; and other.    Medication List       This list is accurate as of: 05/13/14  2:17 PM.  Always use your most recent med list.               ALDACTONE 25 MG tablet  Generic drug:  spironolactone  Take 25 mg by mouth every morning.     ALPRAZolam 0.5 MG tablet  Commonly known as:  XANAX  Take 1 tablet (0.5 mg total) by mouth 2 (two) times daily as needed for anxiety.     capecitabine 500 MG tablet  Commonly known as:  XELODA  Takes 2000 mg in am and 1500 mg in pm X 14 days, 7 day rest. Start date 04/03/14.     [employer] SPECTRAVITE ULTRA MENS Tabs  Take 1 tablet by mouth every morning.     DULCOLAX PO  Take 1 tablet by mouth 2  (two) times daily as needed (constipation/laxative.).     ferrous sulfate 325 (65 FE) MG tablet  Take 1 tablet (325 mg total) by mouth 2 (two) times daily with a meal.     lactose free nutrition Liqd  Take 237 mLs by mouth. From four to eight times daily.     lidocaine-prilocaine cream  Commonly known as:  EMLA  Apply 1 application topically as needed. Apply to [facility] site 1-2 hours prior to stick and cover with plastic wrap     omeprazole 20 MG capsule  Commonly known as:  PRILOSEC  Take 1 capsule (20 mg total) by mouth daily.     ondansetron 4 MG tablet  Commonly known as:  ZOFRAN  Take 1 tablet (4 mg total) by mouth every 8 (eight) hours as needed for nausea or vomiting.     Oxycodone HCl 10 MG Tabs  Take 0.5-1 tablets (5-10 mg total) by mouth every 4 (four) hours as needed (pain.).     polyethylene glycol packet  Commonly known as:  MIRALAX / GLYCOLAX  Take 17 g by mouth daily as needed for mild constipation.     prochlorperazine 10 MG tablet  Commonly known as:  COMPAZINE  Take 1 tablet (10 mg total) by mouth every 6 (six) hours as needed for nausea.         PHYSICAL EXAMINATION  Blood pressure 123/73, pulse 89, temperature 98.3 F (36.8 C), temperature source Oral, resp. rate 18, height _0  (1.981 m), weight 131 lb 12.8 oz (59.784 kg).  Physical Exam  Constitutional: He is oriented to person, place, and time. Vital signs are normal. He appears malnourished. He appears unhealthy. He appears cachectic.  HENT:  Head: Normocephalic and atraumatic.  Mouth/Throat: Oropharyngeal exudate present.  Eyes: Conjunctivae and EOM are normal. Pupils are equal, round, and reactive to light. Right eye exhibits no discharge. Left eye exhibits no discharge. No scleral icterus.  Neck: Normal range of motion. Neck supple. No JVD present. No tracheal deviation present. No thyromegaly present.  Cardiovascular: Normal rate, regular rhythm, normal heart sounds and intact distal pulses.     Pulmonary/Chest: Effort normal and breath sounds normal. No stridor. No respiratory distress. He has no wheezes. He has no rales. He exhibits no tenderness.  Abdominal: Soft. Bowel sounds are normal. He exhibits no distension and no mass. There is no tenderness. There is no rebound and  no guarding.  Colostomy bag with dark, soft stool.  Stoma pink.  Genitourinary:  Right flank pain with any palpation.  No flank pain to left.  Musculoskeletal: Normal range of motion. He exhibits no edema or tenderness.  Lymphadenopathy:    He has no cervical adenopathy.  Neurological: He is alert and oriented to person, place, and time.  Skin: Skin is warm and dry. No rash noted. No erythema. There is pallor.  Psychiatric: Affect normal.  Nursing note and vitals reviewed.   LABORATORY DATA:. Admission on 05/13/2014  Component Date Value Ref Range Status  . Sodium 05/13/2014 132* 135 - 145 mmol/L Final   Please note change in reference range.  . Potassium 05/13/2014 3.9  3.5 - 5.1 mmol/L Final   Please note change in reference range.  . Chloride 05/13/2014 96  96 - 112 mEq/L Final  . CO2 05/13/2014 29  19 - 32 mmol/L Final  . Glucose, Bld 05/13/2014 107* 70 - 99 mg/dL Final  . BUN 05/13/2014 16  6 - 23 mg/dL Final  . Creatinine, Ser 05/13/2014 1.15  0.50 - 1.35 mg/dL Final  . Calcium 05/13/2014 9.0  8.4 - 10.5 mg/dL Final  . Total Protein 05/13/2014 6.2  6.0 - 8.3 g/dL Final  . Albumin 05/13/2014 2.7* 3.5 - 5.2 g/dL Final  . AST 05/13/2014 18  0 - 37 U/L Final  . ALT 05/13/2014 12  0 - 53 U/L Final  . Alkaline Phosphatase 05/13/2014 74  39 - 117 U/L Final  . Total Bilirubin 05/13/2014 0.6  0.3 - 1.2 mg/dL Final  . GFR calc non Af Amer 05/13/2014 69* >90 mL/min Final  . GFR calc Af Amer 05/13/2014 80* >90 mL/min Final   Comment: (NOTE) The eGFR has been calculated using the CKD EPI equation. This calculation has not been validated in all clinical situations. eGFR's persistently <90 mL/min signify  possible Chronic Kidney Disease.   . Anion gap 05/13/2014 7  5 - 15 Final  . Magnesium 05/13/2014 1.9  1.5 - 2.5 mg/dL Final  . Phosphorus 05/13/2014 3.2  2.3 - 4.6 mg/dL Final  . WBC 05/13/2014 4.1  4.0 - 10.5 K/uL Final  . RBC 05/13/2014 2.71* 4.22 - 5.81 MIL/uL Final  . Hemoglobin 05/13/2014 7.5* 13.0 - 17.0 g/dL Final  . HCT 05/13/2014 23.5* 39.0 - 52.0 % Final  . MCV 05/13/2014 86.7  78.0 - 100.0 fL Final  . MCH 05/13/2014 27.7  26.0 - 34.0 pg Final  . MCHC 05/13/2014 31.9  30.0 - 36.0 g/dL Final  . RDW 05/13/2014 22.1* 11.5 - 15.5 % Final  . Platelets 05/13/2014 285  150 - 400 K/uL Final  . Neutrophils Relative % 05/13/2014 83* 43 - 77 % Final  . Lymphocytes Relative 05/13/2014 8* 12 - 46 % Final  . Monocytes Relative 05/13/2014 9  3 - 12 % Final  . Eosinophils Relative 05/13/2014 0  0 - 5 % Final  . Basophils Relative 05/13/2014 0  0 - 1 % Final  . Neutro Abs 05/13/2014 3.4  1.7 - 7.7 K/uL Final  . Lymphs Abs 05/13/2014 0.3* 0.7 - 4.0 K/uL Final  . Monocytes Absolute 05/13/2014 0.4  0.1 - 1.0 K/uL Final  . Eosinophils Absolute 05/13/2014 0.0  0.0 - 0.7 K/uL Final  . Basophils Absolute 05/13/2014 0.0  0.0 - 0.1 K/uL Final  . Smear Review 05/13/2014 MORPHOLOGY UNREMARKABLE   Final  Appointment on 05/12/2014  Component Date Value Ref Range Status  . WBC  05/12/2014 3.2* 4.0 - 10.3 10e3/uL Final  . NEUT# 05/12/2014 2.6  1.5 - 6.5 10e3/uL Final  . HGB 05/12/2014 8.2* 13.0 - 17.1 g/dL Final  . HCT 05/12/2014 25.5* 38.4 - 49.9 % Final  . Platelets 05/12/2014 380  140 - 400 10e3/uL Final  . MCV 05/12/2014 84.0  79.3 - 98.0 fL Final  . MCH 05/12/2014 27.0* 27.2 - 33.4 pg Final  . MCHC 05/12/2014 32.2  32.0 - 36.0 g/dL Final  . RBC 05/12/2014 3.04* 4.20 - 5.82 10e6/uL Final  . RDW 05/12/2014 22.5* 11.0 - 14.6 % Final  . lymph# 05/12/2014 0.1* 0.9 - 3.3 10e3/uL Final  . MONO# 05/12/2014 0.4  0.1 - 0.9 10e3/uL Final  . Eosinophils Absolute 05/12/2014 0.0  0.0 - 0.5 10e3/uL Final  .  Basophils Absolute 05/12/2014 0.0  0.0 - 0.1 10e3/uL Final  . NEUT% 05/12/2014 82.0* 39.0 - 75.0 % Final  . LYMPH% 05/12/2014 4.5* 14.0 - 49.0 % Final  . MONO% 05/12/2014 12.6  0.0 - 14.0 % Final  . EOS% 05/12/2014 0.2  0.0 - 7.0 % Final  . BASO% 05/12/2014 0.7  0.0 - 2.0 % Final  . Sodium 05/12/2014 134* 136 - 145 mEq/L Final  . Potassium 05/12/2014 4.5  3.5 - 5.1 mEq/L Final  . Chloride 05/12/2014 93* 98 - 109 mEq/L Final  . CO2 05/12/2014 32* 22 - 29 mEq/L Final  . Glucose 05/12/2014 148* 70 - 140 mg/dl Final  . BUN 05/12/2014 15.7  7.0 - 26.0 mg/dL Final  . Creatinine 05/12/2014 1.2  0.7 - 1.3 mg/dL Final  . Total Bilirubin 05/12/2014 0.47  0.20 - 1.20 mg/dL Final  . Alkaline Phosphatase 05/12/2014 85  40 - 150 U/L Final  . AST 05/12/2014 16  5 - 34 U/L Final  . ALT 05/12/2014 13  0 - 55 U/L Final  . Total Protein 05/12/2014 6.6  6.4 - 8.3 g/dL Final  . Albumin 05/12/2014 2.5* 3.5 - 5.0 g/dL Final  . Calcium 05/12/2014 9.5  8.4 - 10.4 mg/dL Final  . Anion Gap 05/12/2014 9  3 - 11 mEq/L Final  . EGFR 05/12/2014 69* >90 ml/min/1.73 m2 Final   eGFR is calculated using the CKD-EPI Creatinine Equation (2009)     RADIOGRAPHIC STUDIES: Ct Abdomen Pelvis W Contrast  05/13/2014   ADDENDUM REPORT: 05/13/2014 09:41  ADDENDUM: I discussed the results of this study by telephone with Dr. Benay Spice at approximately 0845 hours. Dr. Benay Spice will be seeing the patient in clinic tomorrow. After that discussion, I also spoke with Dr. Louis Meckel with Urology, who was covering for Dr. Alinda Money. Dr. Carlton Adam office will contact the patient ASAP in order to arrange definitive management for the obstructed right kidney and probable right renal phlegmon/abscess.   Electronically Signed   By: Misty Stanley M.D.   On: 05/13/2014 09:41   05/13/2014   CLINICAL DATA:  Subsequent encounter for rectal cancer. Nausea and vomiting for 1-2 months duration.  EXAM: CT ABDOMEN AND PELVIS WITH CONTRAST  TECHNIQUE:  Multidetector CT imaging of the abdomen and pelvis was performed using the standard protocol following bolus administration of intravenous contrast.  CONTRAST:  78m OMNIPAQUE IOHEXOL 300 MG/ML SOLN, 1062mOMNIPAQUE IOHEXOL 300 MG/ML SOLN  COMPARISON:  12/10/2013  FINDINGS: Lower chest: 7 mm ground-glass nodule in the right middle lobe on image 1 has been incompletely visualized. Clustered ill-defined nodules in the left lower lobe in a bronchoalveolar distribution (Tree-in-bud) have progressed in the interval. There is trace pericardial  fluid.  Hepatobiliary: No focal abnormality within the liver parenchyma. Gallbladder is distended without evidence for wall thickening or stones. Mild prominence of the intrahepatic bile ducts is stable. No dilatation of the extrahepatic common duct.  Pancreas: No focal mass lesion. No dilatation of the main duct. No intraparenchymal cyst. No peripancreatic edema.  Spleen: No splenomegaly. No focal mass lesion.  Adrenals/Urinary Tract: No adrenal nodule or mass. Left kidney is unremarkable. The right kidney is hydronephrotic and shows decreased perfusion compared to the left kidney. Cystic change in the anterior right kidney appears out of proportion to the degree of hydronephrosis and generates mass effect on the renal pelvis and intrarenal collecting system. Tiny gas loculate is again noted within a dilated upper pole caliectasis. A double-J right internal ureteral stent is evident. The distal loop of the stent is formed in the decompressed urinary bladder. A large 3.3 x 2.8 cm stone has formed around the distal loop of the catheter, within the bladder lumen.  Stomach/Bowel: Stomach is nondistended. No gastric wall thickening. No evidence of outlet obstruction. Duodenum is normally positioned as is the ligament of Treitz. No evidence for small bowel obstruction. Terminal ileum is normal. The appendix is normal. No evidence for colonic obstruction. The large rectal mass appears to  of decreased in the interval. The a amorphous area of irregular enhancement in the central pelvis now measures 9.4 x 8.4 cm compared to 10.6 x 13.8 cm previously. The degree of apparent extraluminal gas associated with this region on the previous study has also decreased in the interval.  Vascular/Lymphatic: No abdominal aortic aneurysm. The portal vein and superior mesenteric vein or prominent, but patent. Splenic vein is patent.  Reproductive: Prostate gland is poorly discerned given the neoplastic changes in the central pelvis.  Other: No substantial intraperitoneal free fluid.  Musculoskeletal: Degenerative changes are noted in both hips.  IMPRESSION: Moderate right-sided hydronephrosis with a 4.8 cm ill-defined cystic lesion in the anterior right kidney, generated mass-effect on the renal pelvis and intrarenal collecting system. This does not appear to be related to a dilated phalanx and intrarenal abscess would be indistinct consideration.  Interval development of a 3.3 cm intraluminal bladder stone around the formed loop of the right ureteral catheter.  Interval decrease in size of the large irregular rectal mass involving the pelvic floor.  Electronically Signed: By: Misty Stanley M.D. On: 05/13/2014 08:34    ASSESSMENT/PLAN:    Nausea without vomiting Patient continues to complain of some mild, chronic/intermittent nausea; but denies any vomiting.  Patient is requesting and was given a refill of his Zofran today.    Neoplasm related pain Patient is complaining of progressive right flank pain; as well as generalized pain. Patient was given a prescription refill of both his oxycodone and his Xanax per his request.  Rectal cancer Patient is status post initial oral Xeloda and radiation therapy completed on 01/29/2014.  Patient initiated third cycle of his CAPOX chemotherapy regimen (which consist of both oral Xeloda and oxaliplatin chemotherapy) on 04/24/2014.  He is scheduled to initiate cycle 4  of the same regimen tomorrow 05/14/2014.  Due to admission to the hospital this afternoon for further evaluation and care of obstructing kidney stone and possible kidney abscess-all chemotherapy will be held for one week.  Advised patient and his family would contact them regarding rescheduling of his next chemotherapy.  Ureteral obstruction, right Restaging scan obtained today revealed moderate right sided hydronephrosis with an ill-defined cystic lesion to the right kidney.  Interval development of a 3.3 cm intraluminal latter stone  around the formed loop of the right ureteral catheter.  Questionable right intrarenal abscess noted.  Patient will be direct admitted to the floor per hospitalist.  Gave brief report and history to hospitalist Dr. Doyle Askew prior to patient being transported to the floor for admission.  Also, spoke directly to Dr. Louis Meckel urologist.  Dr. Louis Meckel advised that patient should remain nothing by mouth for probable surgical procedure later this evening.  Called interventional radiology to advise of pending interventional radiology consult for possible abscess drain placement.    Patient stated understanding of all instructions; and was in agreement with this plan of care. The patient knows to call the clinic with any problems, questions or concerns.   Review/collaboration with Dr. Benay Spice  regarding all aspects of patient's visit today.   Total time spent with patient was greater than 70 minutes of extended time;  with greater than75 percent of that time spent in face to face counseling regahis symptoms, and coordination of care and follow up.  Disclaimer: This note was dictated with voice recognition software. Similar sounding words can inadvertently be transcribed and may not be corrected upon review.   Drue Second, NP 05/13/2014   This was a shared visit with Drue Second.  He has completed 3 cycles of Capox. The restaging CT indicates improvement in the pelvic  mass.  He has persistent right hydronephrosis, a bladder stone, and a possible renal abscess. The urology service has been consulted. He will be admitted for further evaluation. The next cycle of chemotherapy will be placed on hold.  Julieanne Manson, M.D.

## 2014-05-13 NOTE — Assessment & Plan Note (Signed)
Patient continues to complain of some mild, chronic/intermittent nausea; but denies any vomiting.  Patient is requesting and was given a refill of his Zofran today.

## 2014-05-13 NOTE — Assessment & Plan Note (Signed)
Patient is complaining of progressive right flank pain; as well as generalized pain. Patient was given a prescription refill of both his oxycodone and his Xanax per his request.

## 2014-05-13 NOTE — Progress Notes (Signed)
Patient was given 1 gm of Ceftriaxone and 240mg  of Gent prior to the procedure.  Patient taken to OR and stone removed from bladder.  Stent was exchanged for a 70F x 28cm stent.  50 cc of thick/purulent/foul smelling urine was drained from renal pelvis.  This was sent to the lab for culture.  83F foley catheter was left at the end of the case.  Rec: Would recommend renal u/s tomorrow morning to evaluate for abscess - What I drained in OR was consistent with abscess drainage.  Consult IR if abscess remains. Remove catheter in AM if no ongoing gross hematuria and no fever. Continue broad spectrum ABX and f/u cultures. He will need a repeat urine culture and CT scan to ensure that his infection clears and abscess resolves within the next 14 days and prior to start of additional chemotherapy. He will need his stent exchanged in no longer than 3 months.  I will schedule him with Dr. Alinda Money - his original urologist here in [place] to ensure that he does not get lost again b/w here and [facility].

## 2014-05-13 NOTE — Consult Note (Signed)
I have been asked to see the patient by Dr. Clemetine Marker, for evaluation and management of encrusted right ureteral stent.  History of present illness: Unfortunate 58 year old male with a history of advanced rectal cancer with right hydro-ureter and hydronephrosis secondary to extrinsic compression who presents today with a bladder stone which appears to be at the distal curl of the ureteral stent, placed in May 2015, and a right renal abscess. The patient has been undergoing chemotherapy and underwent a CT scan for surveillance of his disease were the above was noted. The patient tells me that he has significant right flank pain which has been progressive but worse over the past several days. He also states that he has had a very low-grade fever. Further, the patient states that he has been voiding blood clots for several months as well as what he describes as fecal matter. The patient states that he was diagnosed with a vesico-colonic fistula, But has not received any treatment for this. He does have a proximal diverting colostomy. Initially, the patient was treated for his hydro-ureter with a nephrostomy tube which was subsequently converted to a double-J stent in May 2015. He was then lost to follow-up and has not had his stent changed in the interim.   Review of systems: A 12 point comprehensive review of systems was obtained and is negative unless otherwise stated in the history of present illness.  Patient Active Problem List   Diagnosis Date Noted  . Other malaise and fatigue 01/28/2014  . Pain in male perineum 01/28/2014  . 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 CA of rectum 12/15/2013  . Dehydration 12/10/2013  . Diarrhea 12/10/2013  . Hyponatremia 12/10/2013  . Rectal cancer 11/08/2013  . 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 Ureteral obstruction, right 10/10/2013  . Anemia of chronic disease 10/10/2013  . Severe protein-calorie malnutrition 10/10/2013  .  Microcytic anemia 10/10/2013  . Rectal mass 10/10/2013    No current facility-administered medications on file prior to encounter.   Current Outpatient Prescriptions on File Prior to Encounter  Medication Sig Dispense Refill  . ALPRAZolam (XANAX) 0.5 MG tablet Take 1 tablet (0.5 mg total) by mouth 2 (two) times daily as needed for anxiety. 60 tablet 0  . Bisacodyl (DULCOLAX PO) Take 1 tablet by mouth 2 (two) times daily as needed (constipation/laxative.).    Marland Kitchen capecitabine (XELODA) 500 MG tablet Takes 2000 mg in am and 1500 mg in pm X 14 days, 7 day rest. Start date 04/03/14. 98 tablet 0  . ferrous sulfate 325 (65 FE) MG tablet Take 1 tablet (325 mg total) by mouth 2 (two) times daily with a meal. 60 tablet 0  . lactose free nutrition (BOOST PLUS) LIQD Take 237 mLs by mouth. From four to eight times daily.    Marland Kitchen lidocaine-prilocaine (EMLA) cream Apply 1 application topically as needed. Apply to [facility] site 1-2 hours prior to stick and cover with plastic wrap 30 g 11  . Multiple Vitamins-Minerals ([employer] SPECTRAVITE ULTRA MENS) TABS Take 1 tablet by mouth every morning.     Marland Kitchen omeprazole (PRILOSEC) 20 MG capsule Take 1 capsule (20 mg total) by mouth daily. 30 capsule 3  . ondansetron (ZOFRAN) 4 MG tablet Take 1 tablet (4 mg total) by mouth every 8 (eight) hours as needed for nausea or vomiting. 30 tablet 2  . Oxycodone HCl 10 MG TABS Take 0.5-1 tablets (5-10 mg total) by mouth every 4 (four) hours as needed (pain.). (Patient taking differently:  Take 15 mg by mouth every 4 (four) hours as needed (pain.). ) 75 tablet 0  . polyethylene glycol (MIRALAX / GLYCOLAX) packet Take 17 g by mouth daily as needed for mild constipation.    . prochlorperazine (COMPAZINE) 10 MG tablet Take 1 tablet (10 mg total) by mouth every 6 (six) hours as needed for nausea. (Patient not taking: Reported on 05/13/2014) 60 tablet 1    Past Medical History  Diagnosis Date  . Fecal incontinence   . Rectal cancer  10/11/13 bx      rectum adenocarcinoma   . Cancer 10/11/13    rectal cancer-adenocarcinoma  . Chronic kidney disease     right stent  . Hx of radiation therapy 12/18/13-01/29/14    RECTAL / 54gy    Past Surgical History  Procedure Laterality Date  . Mandible surgery  1983  . Colonoscopy N/A 10/11/2013    Procedure: COLONOSCOPY;  Surgeon: [name], MD;  Location: [place];  Service: Endoscopy;  Laterality: N/A;  . Colon resection N/A 10/15/2013    Procedure: LAP ASSISTED  LOOP COLOSTOMY;  Surgeon: Harl Bowie, MD;  Location: [place];  Service: General;  Laterality: N/A;  . Nephrostomy tube Right 10/18/13    with stent ,tube removed    History  Substance Use Topics  . Smoking status: Former Smoker    Quit date: 05/16/1988  . Smokeless tobacco: Current User    Types: Snuff  . Alcohol Use: Yes     Comment: OCCASIONAL     Family History  Problem Relation Age of Onset  . Cancer Father     pancreatic   . Cancer Maternal Uncle     PE: Filed Vitals:   05/13/14 1502  BP: 112/69  Pulse: 84  Temp: 98.3 F (36.8 C)  TempSrc: Oral  Resp: 16  Height: 6\' 6"  (1.981 m)  Weight: 58.65 kg (129 lb 4.8 oz)  SpO2: 100%   Patient appears to be in no acute distress, very thin  patient is alert and oriented x3 Atraumatic normocephalic head No cervical or supraclavicular lymphadenopathy appreciated No increased work of breathing, no audible wheezes/rhonchi Regular sinus rhythm/rate Abdomen is soft,with suprapubic tenderness as well as right CVA tenderness.  Lower extremities are symmetric without appreciable edema Grossly neurologically intact No identifiable skin lesions   Recent Labs  05/12/14 1508  WBC 3.2*  HGB 8.2*  HCT 25.5*    Recent Labs  05/12/14 1508  NA 134*  K 4.5  CO2 32*  GLUCOSE 148*  BUN 15.7  CREATININE 1.2  CALCIUM 9.5   No results for input(s): LABPT, INR in the last 72 hours. No results for input(s): LABURIN in the last 72 hours. Results for orders  placed or performed in visit on 04/03/14  TECHNOLOGIST REVIEW     Status: None   Collection Time: 04/03/14 11:46 AM  Result Value Ref Range Status   Technologist Review [employer]  present  Final    Imaging:I've independently reviewed the patient's CT scan revealing a stent in the right ureter with a large stone over the distal coil within the patient's bladder. In addition, the patient has mild to moderate hydronephrosis and either a renal abscess or a dilated calyx within the right upper pole.   Imp:  the patient has an encrusted right ureteral stent likely resulting in obstruction from that renal unit. The CT scan appears to show a renal abscess but this also could be a dilated calyx. Although the patient does  not look critically ill, he has been receiving chemotherapy and is immunocompromised.   Recommendations:  I have discussed the situation with his family as well as the patient and recommended that we proceed to the operating room for cystoscopy litholapaxy and attempt at right ureteral stent exchange. Once the kidney is decompressed we can reevaluate with ultrasound to see if the patient continues to have an abscess or whether the area is drained. The patient continues to have a renal abscess following drainage of his kidney he will likely need a percutaneous drain placed within the abscess in interventional radiology. The patient should also be placed on broad-spectrum antibiotics, I will try and obtain a clean urine culture today in the operating room token used to treat his infection within the urinary tract.    Louis Meckel W

## 2014-05-13 NOTE — Progress Notes (Signed)
XELOX is not replaceable drugs

## 2014-05-14 ENCOUNTER — Telehealth: Payer: Self-pay | Admitting: *Deleted

## 2014-05-14 ENCOUNTER — Other Ambulatory Visit: Payer: Self-pay | Admitting: Nurse Practitioner

## 2014-05-14 ENCOUNTER — Ambulatory Visit: Payer: Self-pay

## 2014-05-14 ENCOUNTER — Ambulatory Visit: Payer: Self-pay | Admitting: Oncology

## 2014-05-14 ENCOUNTER — Other Ambulatory Visit: Payer: Self-pay

## 2014-05-14 ENCOUNTER — Inpatient Hospital Stay (HOSPITAL_COMMUNITY): Payer: Medicaid Other

## 2014-05-14 ENCOUNTER — Encounter: Payer: Self-pay | Admitting: Nutrition

## 2014-05-14 ENCOUNTER — Telehealth: Payer: Self-pay | Admitting: Nurse Practitioner

## 2014-05-14 DIAGNOSIS — IMO0002 Reserved for concepts with insufficient information to code with codable children: Secondary | ICD-10-CM | POA: Insufficient documentation

## 2014-05-14 DIAGNOSIS — N151 Renal and perinephric abscess: Secondary | ICD-10-CM | POA: Insufficient documentation

## 2014-05-14 DIAGNOSIS — E43 Unspecified severe protein-calorie malnutrition: Secondary | ICD-10-CM

## 2014-05-14 LAB — PROTIME-INR
INR: 1.35 (ref 0.00–1.49)
Prothrombin Time: 16.8 seconds — ABNORMAL HIGH (ref 11.6–15.2)

## 2014-05-14 LAB — BASIC METABOLIC PANEL
ANION GAP: 9 (ref 5–15)
BUN: 13 mg/dL (ref 6–23)
CHLORIDE: 92 meq/L — AB (ref 96–112)
CO2: 27 mmol/L (ref 19–32)
CREATININE: 1.14 mg/dL (ref 0.50–1.35)
Calcium: 8 mg/dL — ABNORMAL LOW (ref 8.4–10.5)
GFR calc Af Amer: 81 mL/min — ABNORMAL LOW (ref >90)
GFR calc non Af Amer: 70 mL/min — ABNORMAL LOW (ref >90)
GLUCOSE: 95 mg/dL (ref 70–99)
Potassium: 4 mmol/L (ref 3.5–5.1)
Sodium: 128 mmol/L — ABNORMAL LOW (ref 135–145)

## 2014-05-14 LAB — URINALYSIS, ROUTINE W REFLEX MICROSCOPIC
GLUCOSE, UA: NEGATIVE mg/dL
KETONES UR: 15 mg/dL — AB
Nitrite: NEGATIVE
PH: 6.5 (ref 5.0–8.0)
Protein, ur: >300 mg/dL — AB
Specific Gravity, Urine: 1.018 (ref 1.005–1.030)
Urobilinogen, UA: 1 mg/dL (ref 0.0–1.0)

## 2014-05-14 LAB — CBC
HEMATOCRIT: 20.6 % — AB (ref 39.0–52.0)
Hemoglobin: 6.8 g/dL — CL (ref 13.0–17.0)
MCH: 28.5 pg (ref 26.0–34.0)
MCHC: 33 g/dL (ref 30.0–36.0)
MCV: 86.2 fL (ref 78.0–100.0)
Platelets: 200 10*3/uL (ref 150–400)
RBC: 2.39 MIL/uL — ABNORMAL LOW (ref 4.22–5.81)
RDW: 22.5 % — ABNORMAL HIGH (ref 11.5–15.5)
WBC: 6.4 10*3/uL (ref 4.0–10.5)

## 2014-05-14 LAB — RETICULOCYTES
RBC.: 2.58 MIL/uL — ABNORMAL LOW (ref 4.22–5.81)
RETIC CT PCT: 2.2 % (ref 0.4–3.1)
Retic Count, Absolute: 56.8 10*3/uL (ref 19.0–186.0)

## 2014-05-14 LAB — HEMOGLOBIN AND HEMATOCRIT, BLOOD
HCT: 23.5 % — ABNORMAL LOW (ref 39.0–52.0)
Hemoglobin: 7.7 g/dL — ABNORMAL LOW (ref 13.0–17.0)

## 2014-05-14 LAB — IRON AND TIBC
Iron: <10 ug/dL — ABNORMAL LOW (ref 42–165)
UIBC: 195 ug/dL (ref 125–400)

## 2014-05-14 LAB — URINE MICROSCOPIC-ADD ON

## 2014-05-14 LAB — PREPARE RBC (CROSSMATCH)

## 2014-05-14 LAB — VITAMIN B12: Vitamin B-12: 1563 pg/mL — ABNORMAL HIGH (ref 211–911)

## 2014-05-14 LAB — APTT: aPTT: 38 seconds — ABNORMAL HIGH (ref 24–37)

## 2014-05-14 LAB — FOLATE: Folate: >20 ng/mL

## 2014-05-14 LAB — FERRITIN: Ferritin: 782 ng/mL — ABNORMAL HIGH (ref 22–322)

## 2014-05-14 MED ORDER — HYDROMORPHONE HCL 1 MG/ML IJ SOLN
1.0000 mg | INTRAMUSCULAR | Status: AC
  Administered 2014-05-14: 1 mg via INTRAVENOUS

## 2014-05-14 MED ORDER — BISACODYL 5 MG PO TBEC: 5.0000 mg | DELAYED_RELEASE_TABLET | Freq: Two times a day (BID) | ORAL | Status: DC | PRN

## 2014-05-14 MED ORDER — SODIUM CHLORIDE 0.9 % IV SOLN: Freq: Once | INTRAVENOUS | Status: AC

## 2014-05-14 MED ORDER — BOOST PLUS PO LIQD
237.0000 mL | Freq: Four times a day (QID) | ORAL | Status: DC
  Administered 2014-05-14 – 2014-05-22 (×26): 237 mL via ORAL
  Filled 2014-05-14 (×35): qty 237

## 2014-05-14 MED ORDER — POLYETHYLENE GLYCOL 3350 17 G PO PACK
17.0000 g | PACK | Freq: Every day | ORAL | Status: DC | PRN
  Administered 2014-05-21: 17 g via ORAL
  Filled 2014-05-14 (×3): qty 1

## 2014-05-14 MED ORDER — OXYCODONE HCL 5 MG PO TABS
10.0000 mg | ORAL_TABLET | Freq: Four times a day (QID) | ORAL | Status: DC | PRN
Start: 2014-05-14 — End: 2014-05-15
  Administered 2014-05-14 – 2014-05-15 (×3): 10 mg via ORAL
  Filled 2014-05-14 (×3): qty 2

## 2014-05-14 NOTE — Progress Notes (Signed)
Progress Note   [name] IRS:[id number] DOB: 07-Apr-1956 DOA: 05/13/2014 PCP: MATTHEWS,MICHELLE A., MD   Brief Narrative:   Grant [name] is an 58 y.o. male with past medical history of rectal cancer (had initiation of radiation and Xeloda 12/18/2013, completed 01/29/2014), restaging CT evaluation at [facility] showed decrease in size of the rectosigmoid mass post radiation changes, has had chemotherapy with Capox starting 03/13/2014 (cycle 3 given 04/24/2014), history of right hydronephrosis secondary to the obstructing pelvic mass, status post placement of a right percutaneous nephrostomy tube followed by a right ureter stent, history of colovesical fistula and has proximal diverting colostomy who presented 05/13/14 as a direct admission from cancer center for evaluation of hematuria.  He had a staging CT scandone 05/13/2014 which showed an obstructed right kidney due to stone and possible right renal abscess.  He subsequently underwent cystolithalopaxy and right ureteral stent exchange with aspiration of 50cc of purulent fluid from right kidney.   Assessment/Plan:   Principal Problem:  Ureteral obstruction, right / right hydronephrosis / right renal abscess  S/P cystolithalopaxy and right ureteral stent exchange with aspiration of 50cc of purulent fluid from right kidney.  IR consulted for percutaneous drainage for renal abscess.  Patient was placed on Rocephin IV every 24 hours.  Continue supportive care with IV fluids, analgesia as needed.  Active Problems:  Anemia of chronic disease  Secondary to history of malignancy.  Given 1 unit of packed red blood cells today for hemoglobin of 6.8 mg/dL.   Severe protein-calorie malnutrition  In the context of chronic illness. Nutrition consulted.   CA of rectum  Management per oncology.    DVT prophylaxis:   Lovenox SQ ordered.  Code Status: Full. Family Communication: Updated at the bedside. Disposition Plan:  Home when stable.   IV Access:    Porta-Cath   Procedures and diagnostic studies:   Ct Abdomen Pelvis W Contrast 05/13/2014: Moderate right-sided hydronephrosis with a 4.8 cm ill-defined cystic lesion in the anterior right kidney, generated mass-effect on the renal pelvis and intrarenal collecting system. This does not appear to be related to a dilated phalanx and intrarenal abscess would be indistinct consideration.  Interval development of a 3.3 cm intraluminal bladder stone around the formed loop of the right ureteral catheter.  Interval decrease in size of the large irregular rectal mass involving the pelvic floor.    Medical Consultants:    Dr. Louis Meckel, Urology.  Anti-Infectives:    Rocephin 05/13/14--->  Subjective:    Bryston Colocho continues to have some penile irritation and oozing of blood about the meatus. He has a Foley catheter in its draining blood-tinged urine. He has a left lower quadrant ostomy with dark colored stool and air in the bag (patient can takes iron). No current nausea or vomiting.  Objective:    Filed Vitals:   05/14/14 0016 05/14/14 0147 05/14/14 0532 05/14/14 0700  BP: 98/53 104/60 90/53 102/54  Pulse: 103 97 97   Temp: 97.9 F (36.6 C) 99.6 F (37.6 C) 98.8 F (37.1 C)   TempSrc: Oral Oral Oral   Resp: 16 16 16    Height:      Weight:   59.693 kg (131 lb 9.6 oz)   SpO2: 100% 100% 100%     Intake/Output Summary (Last 24 hours) at 05/14/14 0811 Last data filed at 05/14/14 0532  Gross per 24 hour  Intake   1150 ml  Output    430 ml  Net    720  ml    Exam: Gen:  NAD, emaciated Cardiovascular:  RRR, No M/R/G, LLQ ostomy Respiratory:  Lungs CTAB Gastrointestinal:  Abdomen soft, NT/ND, + BS Extremities:  No C/E/C   Data Reviewed:    Labs: Basic Metabolic Panel:  Recent Labs Lab 05/12/14 1508 05/13/14 1700 05/14/14 0550  NA 134* 132* 128*  K 4.5 3.9 4.0  CL  --  96 92*  CO2 32* 29 27  GLUCOSE 148* 107* 95    BUN 15.7 16 13   CREATININE 1.2 1.15 1.14  CALCIUM 9.5 9.0 8.0*  MG  --  1.9  --   PHOS  --  3.2  --    GFR Estimated Creatinine Clearance: 61.1 mL/min (by C-G formula based on Cr of 1.14). Liver Function Tests:  Recent Labs Lab 05/12/14 1508 05/13/14 1700  AST 16 18  ALT 13 12  ALKPHOS 85 74  BILITOT 0.47 0.6  PROT 6.6 6.2  ALBUMIN 2.5* 2.7*   CBC:  Recent Labs Lab 05/12/14 1508 05/13/14 1700 05/14/14 0550  WBC 3.2* 4.1 6.4  NEUTROABS 2.6 3.4  --   HGB 8.2* 7.5* 6.8*  HCT 25.5* 23.5* 20.6*  MCV 84.0 86.7 86.2  PLT 380 285 200   Anemia work up:  Recent Labs  05/14/14 0730  RETICCTPCT 2.2    Microbiology Recent Results (from the past 240 hour(s))  Surgical pcr screen     Status: None   Collection Time: 05/13/14  3:58 PM  Result Value Ref Range Status   MRSA, PCR NEGATIVE NEGATIVE Final   Staphylococcus aureus NEGATIVE NEGATIVE Final    Comment:        The Xpert SA Assay (FDA approved for NASAL specimens in patients over 9 years of age), is one component of a comprehensive surveillance program.  Test performance has been validated by [employer] for patients greater than or equal to 53 year old. It is not intended to diagnose infection nor to guide or monitor treatment.      Medications:   . sodium chloride   Intravenous Once  . cefTRIAXone (ROCEPHIN)  IV  1 g Intravenous Q24H  . enoxaparin (LOVENOX) injection  40 mg Subcutaneous Q24H  . fentaNYL      . HYDROmorphone      . pantoprazole  40 mg Oral Daily   Continuous Infusions: . sodium chloride 75 mL/hr at 05/14/14 0738    Time spent: 25 minutes.   LOS: 1 day   RAMA,CHRISTINA  [facility] Pager [phone]. If unable to reach me by pager, please call my cell phone at [phone].  *Please refer to [email or web address], password [email or web address] to get updated schedule on who will round on this patient, as hospitalists switch teams weekly. If 7PM-7AM, please contact night-coverage at [email or web address],  password [email or web address] for any overnight needs.  05/14/2014, 8:11 AM

## 2014-05-14 NOTE — Telephone Encounter (Signed)
Per staff message and POF I have scheduled appts. Advised scheduler of appts. JMW  

## 2014-05-14 NOTE — Progress Notes (Signed)
INITIAL NUTRITION ASSESSMENT  DOCUMENTATION CODES Per approved criteria  -Severe malnutrition in the context of chronic illness -Underweight  Pt meets criteria for severe MALNUTRITION in the context of chronic illness as evidenced by 19% body weight loss in 6 months, PO intake <75% for > one month, severe muscle wasting and fat loss.   INTERVENTION: -Recommend Boost Plus QID, each supplement provides 360 kcal, 14 gram protein -Encouraged intake of small frequent high protein meals and snacks -Reviewed nutrition therapy to assist in taste changes -RD to provide nutrition coupons for Boost supplement prior to d/c   NUTRITION DIAGNOSIS: Unintentional wt loss related to inadequate oral intake/chronic disease as evidenced by 30 lb wt loss in 6 months.   Goal: Pt to meet >/= 90% of their estimated nutrition needs    Monitor:  Total protein/energy intake, labs, weights, Gi profile, education needs  Reason for Assessment: MST/Underweight BMI/Consult to Assess  58 y.o. 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 Was 160 lb in 10/2013; indicating a 30 lb wt loss (19% body weight loss, severe for time frame) -Diet recall indicates pt consuming 4-7 Boost supplements daily,  depending on tolerance. Will try to consume small meals and snacks in-between meals; however reported feelings of early satiety and taste changes that inhibit intake -Reviewed nutrition therapies to improve "too sweet" or "too salty" tastes; encouraged use of Biotene and lemon drops/mints to cleanse palate -Has not consumed any foods for past 2 days d/t nausea. Was consuming > 50% of breakfast during time of assessment -Pt reported use of marijuana to assist in appetite stimulation -RD to order Boost QID w/meals per pt preference and will provide coupons prior to d/c -Severe muscle and fat wasting noted in all regions of body  Height: Ht Readings from Last 1 Encounters:  05/13/14 6\' 6"  (1.981 m)    Weight: Wt Readings from Last 1 Encounters:  05/14/14 131 lb 9.6 oz (59.693 kg)    Ideal Body Weight: 214 lb  % Ideal Body Weight: 61%  Wt Readings from Last 10 Encounters:  05/14/14 131 lb 9.6 oz (59.693 kg)  05/13/14 131 lb 12.8 oz (59.784 kg)  04/24/14 143 lb 8 oz (65.091 kg)  04/21/14 143 lb (64.864 kg)  04/03/14 149 lb 9.6 oz (67.858 kg)  03/20/14 144 lb (65.318 kg)  03/05/14 140 lb 4.8 oz (63.64 kg)  01/28/14 138 lb (62.596 kg)  01/27/14 139 lb 6.4 oz (63.231 kg)  01/23/14 141 lb 3.2 oz (64.048 kg)    Usual Body Weight: 180-200 lb  % Usual Body Weight: ~60-70%  BMI:  Body mass index is 15.21 kg/(m^2). Underweight  Estimated Nutritional Needs: Kcal: 2100-2300 Protein: 120-135 gram Fluid: >/=2100 ml daily  Skin: WDL  Diet Order: Diet regular  EDUCATION NEEDS: -Education needs addressed   Intake/Output Summary (Last 24 hours) at 05/14/14 1150 Last data filed at 05/14/14 [phone]  Gross per 24 hour  Intake   1280 ml  Output    730 ml  Net    550 ml    Last BM: 12/29- colostomy   Labs:   Recent Labs Lab 05/12/14 1508 05/13/14 1700 05/14/14 0550  NA 134* 132* 128*  K 4.5 3.9 4.0  CL  --  96 92*  CO2 32* 29 27  BUN 15.7 16 13   CREATININE 1.2 1.15 1.14   CALCIUM 9.5 9.0 8.0*  MG  --  1.9  --   PHOS  --  3.2  --   GLUCOSE 148* 107* 95    CBG (last 3)  No results for input(s): GLUCAP in the last 72 hours.  Scheduled Meds: . sodium chloride   Intravenous Once  . cefTRIAXone (ROCEPHIN)  IV  1 g Intravenous Q24H  . enoxaparin (LOVENOX) injection  40 mg Subcutaneous Q24H  . lactose free nutrition  237 mL Oral QID  . pantoprazole  40 mg Oral Daily    Continuous Infusions: . sodium chloride 75 mL/hr at 05/14/14 0388    Past Medical History  Diagnosis Date  . Fecal incontinence   . 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 Cancer 10/11/13    rectal cancer-adenocarcinoma  . Chronic kidney disease     right stent  . Hx of radiation therapy 12/18/13-01/29/14    RECTAL / 55gy    Past Surgical History  Procedure Laterality Date  . Mandible surgery  1983  . Colonoscopy N/A 10/11/2013    Procedure: COLONOSCOPY;  Surgeon: [name], MD;  Location: [place];  Service: Endoscopy;  Laterality: N/A;  . Colon resection N/A 10/15/2013    Procedure: LAP ASSISTED  LOOP COLOSTOMY;  Surgeon: Harl Bowie, MD;  Location: [place];  Service: General;  Laterality: N/A;  . Nephrostomy tube Right 10/18/13    with stent ,tube removed    Atlee Abide MS RD LDN Clinical Dietitian [phone]

## 2014-05-14 NOTE — Progress Notes (Signed)
Patient with increased pain in bladder and feeling of urgency with leakage of blood tinged urine around foley catheter - irrigated x 2 with 30cc NS each with return of pink tinged urine.  Patient verbalized relief of bladder pain and pressure after irrigation.

## 2014-05-14 NOTE — Progress Notes (Signed)
CRITICAL VALUE ALERT  Critical value received:  Hgb 6.8  Date of notification:  05/14/14  Time of notification:  0625  Critical value read back: yes  Nurse who received alert:  N. Aquan Kope  MD notified (1st page):  Baltazar Najjar  Time of first page:  0628  MD notified (2nd page):  Time of second page:  Responding MD:  Baltazar Najjar  Time MD responded:  [phone], new orders given

## 2014-05-14 NOTE — Progress Notes (Signed)
S/p cystolithalopaxy and right ureteral stent exchange with aspiration of 50cc of purulent fluid from right kidney on 05/13/14  Intv: Pain in penis and right flank in addition on going pelvic pain Stable overnight  PE: NAD Abdomen soft Foley draining blood tinged urine   Recent Labs  05/12/14 1508 05/13/14 1700 05/14/14 0550  WBC 3.2* 4.1 6.4  HGB 8.2* 7.5* 6.8*  HCT 25.5* 23.5* 20.6*    Recent Labs  05/12/14 1508 05/13/14 1700 05/14/14 0550  NA 134* 132* 128*  K 4.5 3.9 4.0  CL  --  96 92*  CO2 32* 29 27  GLUCOSE 148* 107* 95  BUN 15.7 16 13   CREATININE 1.2 1.15 1.14  CALCIUM 9.5 9.0 8.0*   No results for input(s): LABPT, INR in the last 72 hours. No results for input(s): LABURIN in the last 72 hours. Results for orders placed or performed during the hospital encounter of 05/13/14  Surgical pcr screen     Status: None   Collection Time: 05/13/14  3:58 PM  Result Value Ref Range Status   MRSA, PCR NEGATIVE NEGATIVE Final   Staphylococcus aureus NEGATIVE NEGATIVE Final    Comment:        The Xpert SA Assay (FDA approved for NASAL specimens in patients over 32 years of age), is one component of a comprehensive surveillance program.  Test performance has been validated by [employer] for patients greater than or equal to 1 year old. It is not intended to diagnose infection nor to guide or monitor treatment.      Imp:  Pyelitis with obstructed right ureteral stent secondary to bladder stone/stent encrustation Rec: keep foley today - consider removal tomorrow if hematuria clears.  Renal ultrasound today to eval for abscess.  We will continue to follow.

## 2014-05-14 NOTE — Consult Note (Signed)
Reason for consult: right renal abscess drainage  Referring Physician(s):  Dr. Louis Meckel  History of Present Illness: Grant [name] is a 58 y.o. 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 He had a staging CT scandone 05/13/2014 which showed an obstructed right kidney due to stone and possible right renal abscess. He subsequently underwent cystolithalopaxy and encrusted right ureteral stent exchange 12/29 with aspiration of 50cc of purulent fluid from right kidney. Follow up renal US 12/30 revealed persistent 6.6 cm right renal abscess and request now made for CT guided drainage.   Past Medical History  Diagnosis Date  . Fecal incontinence   . 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 Cancer 10/11/13    rectal cancer-adenocarcinoma  . Chronic kidney disease     right stent  . Hx of radiation therapy 12/18/13-01/29/14    RECTAL / 55gy    Past Surgical History  Procedure Laterality Date  . Mandible surgery  1983  . Colonoscopy N/A 10/11/2013    Procedure: COLONOSCOPY;  Surgeon: [name], MD;  Location: [place];  Service: Endoscopy;  Laterality: N/A;  . Colon resection N/A 10/15/2013    Procedure: LAP ASSISTED  LOOP COLOSTOMY;  Surgeon: Harl Bowie, MD;  Location: [place];  Service: General;  Laterality: N/A;  . Nephrostomy tube Right 10/18/13    with stent ,tube removed     Allergies: Strawberry; Sunflower oil; Watermelon; and Other  Medications: Prior to Admission medications   Medication Sig Start Date End Date Taking? Authorizing Provider  ALPRAZolam Duanne Moron) 0.5 MG tablet Take 1 tablet (0.5 mg total) by mouth 2 (two) times daily as needed for anxiety. 05/13/14  Yes Drue Second, NP  Bisacodyl (DULCOLAX PO) Take 1 tablet by mouth 2 (two) times daily as needed (constipation/laxative.).   Yes Historical Provider, MD  capecitabine (XELODA) 500 MG tablet Takes 2000 mg in am and 1500 mg in pm X 14 days, 7 day rest. Start date 04/03/14. 04/24/14  Yes [name], MD  ferrous sulfate 325 (65 FE) MG tablet Take 1 tablet (325 mg total) by mouth 2 (two) times daily with a meal. 10/20/13  Yes Nishant Dhungel, MD  lactose free nutrition (BOOST PLUS) LIQD Take 237 mLs by mouth. From four to eight times daily.   Yes Historical Provider, MD  lidocaine-prilocaine (EMLA) cream Apply 1 application topically as needed. Apply to [facility] site 1-2 hours prior to stick and cover with plastic wrap 03/12/14  Yes [name], MD  Multiple Vitamins-Minerals ([employer] SPECTRAVITE ULTRA MENS) TABS Take 1 tablet by mouth every morning.  10/20/13  Yes Historical Provider, MD  omeprazole (PRILOSEC) 20 MG capsule Take 1 capsule (20 mg total) by mouth daily. 04/21/14  Yes Leana Gamer, MD  ondansetron (ZOFRAN) 4 MG tablet Take 1 tablet (4 mg total) by mouth every 8 (eight) hours as needed for nausea or vomiting. 05/13/14  Yes Drue Second, NP  Oxycodone HCl 10 MG  TABS Take 0.5-1 tablets (5-10 mg total) by mouth every 4 (four) hours as needed (pain.). Patient taking differently: Take 15 mg by mouth every 4 (four) hours as needed (pain.).  05/13/14  Yes Drue Second, NP  polyethylene glycol (MIRALAX / GLYCOLAX) packet Take 17 g by mouth daily as needed for mild constipation.   Yes Historical Provider, MD  spironolactone (ALDACTONE) 25 MG tablet Take 25 mg by mouth daily as needed (swelling).    Yes Historical Provider, MD  prochlorperazine (COMPAZINE) 10 MG tablet Take 1 tablet (10 mg total) by mouth every 6 (six) hours as needed for nausea. Patient not taking: Reported on 05/13/2014 03/12/14   [name], MD    Family History  Problem Relation Age of Onset  . Cancer Father     pancreatic   . Cancer Maternal Uncle     History   Social History  . Marital Status: Single    Spouse Name: N/A    Number of Children: N/A  . Years of Education: N/A   Social History Main Topics  . Smoking status: Former Smoker    Quit date: 05/16/1988  . Smokeless tobacco: Current User    Types: Snuff  . Alcohol Use: Yes     Comment: OCCASIONAL   . Drug Use: Yes    Special: Marijuana  . Sexual Activity: None   Other Topics Concern  . None   Social History Narrative      Review of Systems  Constitutional: Negative for fever, appetite change and unexpected weight change.       Occ chills  Respiratory: Negative for cough and shortness of breath.   Cardiovascular: Negative for chest pain.  Gastrointestinal: Positive for nausea and abdominal pain. Negative for vomiting.  Genitourinary: Positive for penile pain.       Some bleeding from meatus(foley in place)  Musculoskeletal: Positive for back pain.  Neurological: Positive for weakness. Negative for headaches.    Vital Signs: BP 87/50 mmHg  Pulse 92  Temp(Src) 100.2 F (37.9 C) (Oral)  Resp 16  Ht 6\' 6"  (1.981 m)  Wt 131 lb 9.6 oz (59.693 kg)  BMI 15.21 kg/m2  SpO2 100%  Physical Exam  Constitutional: He is oriented to person, place, and time.  Cachetic WM in NAD  Cardiovascular: Normal rate and regular rhythm.   Pulmonary/Chest: Effort normal and breath sounds normal.  Clean, intact rt chest wall PAC  Abdominal: Soft. Bowel sounds are normal. There is tenderness.  Intact LLQ colostomy  Musculoskeletal: Normal range of motion. He exhibits no edema.  Neurological: He is alert and oriented to person, place, and  time.    Imaging: Ct Abdomen Pelvis W Contrast  05/13/2014   ADDENDUM REPORT: 05/13/2014 09:41  ADDENDUM: I discussed the results of this study by telephone with Dr. Benay Spice at approximately 0845 hours. Dr. Benay Spice will be seeing the patient in clinic tomorrow. After that discussion, I also spoke with Dr. Louis Meckel with Urology, who was covering for Dr. Alinda Money. Dr. Carlton Adam office will contact the patient ASAP in order to arrange definitive management for the obstructed right kidney and probable right renal phlegmon/abscess.   Electronically Signed   By: Misty Stanley M.D.   On: 05/13/2014 09:41   05/13/2014   CLINICAL DATA:  Subsequent encounter for rectal cancer. Nausea and vomiting for 1-2 months duration.  EXAM: CT ABDOMEN AND PELVIS WITH CONTRAST  TECHNIQUE: Multidetector CT imaging of the abdomen and pelvis was performed using the standard protocol following  bolus administration of intravenous contrast.  CONTRAST:  12mL OMNIPAQUE IOHEXOL 300 MG/ML SOLN, 168mL OMNIPAQUE IOHEXOL 300 MG/ML SOLN  COMPARISON:  12/10/2013  FINDINGS: Lower chest: 7 mm ground-glass nodule in the right middle lobe on image 1 has been incompletely visualized. Clustered ill-defined nodules in the left lower lobe in a bronchoalveolar distribution (Tree-in-bud) have progressed in the interval. There is trace pericardial fluid.  Hepatobiliary: No focal abnormality within the liver parenchyma. Gallbladder is distended without evidence for wall thickening or stones. Mild prominence of the intrahepatic bile ducts is stable. No dilatation of the extrahepatic common duct.  Pancreas: No focal mass lesion. No dilatation of the main duct. No intraparenchymal cyst. No peripancreatic edema.  Spleen: No splenomegaly. No focal mass lesion.  Adrenals/Urinary Tract: No adrenal nodule or mass. Left kidney is unremarkable. The right kidney is hydronephrotic and shows decreased perfusion compared to the left kidney. Cystic change in the anterior  right kidney appears out of proportion to the degree of hydronephrosis and generates mass effect on the renal pelvis and intrarenal collecting system. Tiny gas loculate is again noted within a dilated upper pole caliectasis. A double-J right internal ureteral stent is evident. The distal loop of the stent is formed in the decompressed urinary bladder. A large 3.3 x 2.8 cm stone has formed around the distal loop of the catheter, within the bladder lumen.  Stomach/Bowel: Stomach is nondistended. No gastric wall thickening. No evidence of outlet obstruction. Duodenum is normally positioned as is the ligament of Treitz. No evidence for small bowel obstruction. Terminal ileum is normal. The appendix is normal. No evidence for colonic obstruction. The large rectal mass appears to of decreased in the interval. The a amorphous area of irregular enhancement in the central pelvis now measures 9.4 x 8.4 cm compared to 10.6 x 13.8 cm previously. The degree of apparent extraluminal gas associated with this region on the previous study has also decreased in the interval.  Vascular/Lymphatic: No abdominal aortic aneurysm. The portal vein and superior mesenteric vein or prominent, but patent. Splenic vein is patent.  Reproductive: Prostate gland is poorly discerned given the neoplastic changes in the central pelvis.  Other: No substantial intraperitoneal free fluid.  Musculoskeletal: Degenerative changes are noted in both hips.  IMPRESSION: Moderate right-sided hydronephrosis with a 4.8 cm ill-defined cystic lesion in the anterior right kidney, generated mass-effect on the renal pelvis and intrarenal collecting system. This does not appear to be related to a dilated phalanx and intrarenal abscess would be indistinct consideration.  Interval development of a 3.3 cm intraluminal bladder stone around the formed loop of the right ureteral catheter.  Interval decrease in size of the large irregular rectal mass involving the pelvic  floor.  Electronically Signed: By: Misty Stanley M.D. On: 05/13/2014 08:34   US Renal  05/14/2014   CLINICAL DATA:  Renal abscess, hydronephrosis. Recent cystoscopic exchange of occluded ureteral stent.  EXAM: RENAL/URINARY TRACT ULTRASOUND COMPLETE  COMPARISON:  CT 05/12/2014  FINDINGS: Right Kidney:  Length: 11 cm. There is a hypoechoic somewhat lobular 66 x 43 x 48 mm process in the mid upper pole right kidney corresponding to the low-attenuation collection seen on prior CT, not appreciably decompressed. Interval resolution of hydronephrosis. Echogenic regions in the collecting system may represent the double-J stent or gas from recent procedure.  Left Kidney:  Length: 12.3 cm. Echogenicity within normal limits. No mass or hydronephrosis visualized.  Bladder:  Decompressed by Foley catheter.  Distal end of double-J stent noted.  IMPRESSION: 1. Persistent 6.6   cm Right renal abscess. 2. Interval resolution of hydronephrosis.   Electronically Signed   By: Arne Cleveland M.D.   On: 05/14/2014 15:53    Labs:  CBC:  Recent Labs  04/24/14 1147 05/12/14 1508 05/13/14 1700 05/14/14 0550  WBC 4.2 3.2* 4.1 6.4  HGB 7.2* 8.2* 7.5* 6.8*  HCT 22.8* 25.5* 23.5* 20.6*  PLT 219 380 285 200    COAGS:  Recent Labs  10/09/13 1331 10/09/13 2138 03/12/14 1310  INR 1.19 1.34 1.13  APTT  --  34 37    BMP:  Recent Labs  12/12/13 0413 12/25/13 1359  03/12/14 1310  04/24/14 1147 05/12/14 1508 05/13/14 1700 05/14/14 0550  NA 134* 131*  < > 131*  < > 135* 134* 132* 128*  K 3.6* 4.6  < > 4.4  < > 4.5 4.5 3.9 4.0  CL 99 92*  --  92*  --   --   --  96 92*  CO2 23 27  < > 29  < > 30* 32* 29 27  GLUCOSE 166* 129*  < > 81  < > 159* 148* 107* 95  BUN 11 16  < > 17  < > 14.7 15.7 16 13   CALCIUM 8.4 9.2  < > 9.0  < > 9.1 9.5 9.0 8.0*  CREATININE 0.90 1.05  < > 0.95  < > 1.0 1.2 1.15 1.14  GFRNONAA >90  --   --  >90  --   --   --  69* 70*  GFRAA >90  --   --  >90  --   --   --  80* 81*  < > =  values in this interval not displayed.  LIVER FUNCTION TESTS:  Recent Labs  04/03/14 1146 04/24/14 1147 05/12/14 1508 05/13/14 1700  BILITOT 0.29 0.31 0.47 0.6  AST 9 8 16 18   ALT 6 8 13 12   ALKPHOS 76 75 85 74  PROT 6.8 6.4 6.6 6.2  ALBUMIN 2.5* 2.3* 2.5* 2.7*    TUMOR MARKERS:  Recent Labs  10/09/13 2138 10/10/13 0345  AFPTM 1.7  --   CEA  --  3.7  CA199 10.3*  --     Assessment and Plan: Jahkari Maclin is a 58 y.o. 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 He had a staging CT scandone 05/13/2014 which showed an obstructed right kidney due to stone and possible right renal abscess. He subsequently underwent cystolithalopaxy and encrusted right ureteral stent exchange 12/29 with aspiration of 50cc of purulent fluid from right kidney. Follow up renal US 12/30 revealed persistent 6.6 cm right renal abscess and request now made for CT guided drainage. Imaging studies have been reviewed . Tent plan based on urology recommendations is for CT guided drainage of right renal abscess on 12/31. Attempts were made to contact Dr. Louis Meckel this afternoon but unsuccessful. Details/risks of procedure d/w pt with his understanding and consent. Will recheck labs in am and proceed  on 12/31 with case if pt stable/urology concurs.    I spent a total of 20 minutes face to face in clinical consultation, greater  than 50% of which was counseling/coordinating care for right renal abscess drainage  Signed: Autumn Messing 05/14/2014, 4:21 PM

## 2014-05-14 NOTE — Anesthesia Postprocedure Evaluation (Signed)
  Anesthesia Post-op Note  Patient: Grant [name]  Procedure(s) Performed: Procedure(s) (LRB): CYSTOSCOPY WITH URETEROSCOPY (Right) HOLMIUM LASER APPLICATION (N/A)  Patient Location: PACU  Anesthesia Type: General  Level of Consciousness: awake and alert   Airway and Oxygen Therapy: Patient Spontanous Breathing  Post-op Pain: mild  Post-op Assessment: Post-op Vital signs reviewed, Patient's Cardiovascular Status Stable, Respiratory Function Stable, Patent Airway and No signs of Nausea or vomiting  Last Vitals:  Filed Vitals:   05/14/14 0700  BP: 102/54  Pulse:   Temp:   Resp:     Post-op Vital Signs: stable   Complications: No apparent anesthesia complications

## 2014-05-14 NOTE — Telephone Encounter (Signed)
Confirm appt for 05/23/14. Pt will check mychart

## 2014-05-15 ENCOUNTER — Inpatient Hospital Stay (HOSPITAL_COMMUNITY): Payer: Medicaid Other

## 2014-05-15 ENCOUNTER — Encounter (HOSPITAL_COMMUNITY): Payer: Self-pay | Admitting: Urology

## 2014-05-15 ENCOUNTER — Encounter: Payer: Self-pay | Admitting: *Deleted

## 2014-05-15 LAB — CBC
HCT: 22.9 % — ABNORMAL LOW (ref 39.0–52.0)
Hemoglobin: 7.6 g/dL — ABNORMAL LOW (ref 13.0–17.0)
MCH: 28.5 pg (ref 26.0–34.0)
MCHC: 33.2 g/dL (ref 30.0–36.0)
MCV: 85.8 fL (ref 78.0–100.0)
Platelets: 181 10*3/uL (ref 150–400)
RBC: 2.67 MIL/uL — AB (ref 4.22–5.81)
RDW: 20.8 % — ABNORMAL HIGH (ref 11.5–15.5)
WBC: 4.8 10*3/uL (ref 4.0–10.5)

## 2014-05-15 LAB — BASIC METABOLIC PANEL
Anion gap: 5 (ref 5–15)
BUN: 13 mg/dL (ref 6–23)
CO2: 28 mmol/L (ref 19–32)
Calcium: 7.9 mg/dL — ABNORMAL LOW (ref 8.4–10.5)
Chloride: 96 mEq/L (ref 96–112)
Creatinine, Ser: 0.95 mg/dL (ref 0.50–1.35)
GFR calc Af Amer: >90 mL/min (ref >90)
GFR calc non Af Amer: >90 mL/min (ref >90)
GLUCOSE: 127 mg/dL — AB (ref 70–99)
Potassium: 3.9 mmol/L (ref 3.5–5.1)
Sodium: 129 mmol/L — ABNORMAL LOW (ref 135–145)

## 2014-05-15 LAB — TYPE AND SCREEN
ABO/RH(D): A POS
Antibody Screen: NEGATIVE
UNIT DIVISION: 0

## 2014-05-15 LAB — URINE CULTURE
COLONY COUNT: NO GROWTH
CULTURE: NO GROWTH

## 2014-05-15 MED ORDER — MIDAZOLAM HCL 2 MG/2ML IJ SOLN
INTRAMUSCULAR | Status: AC | PRN
  Administered 2014-05-15 (×4): 1 mg via INTRAVENOUS

## 2014-05-15 MED ORDER — FENTANYL CITRATE 0.05 MG/ML IJ SOLN
INTRAMUSCULAR | Status: AC
  Filled 2014-05-15: qty 4

## 2014-05-15 MED ORDER — FENTANYL CITRATE 0.05 MG/ML IJ SOLN
INTRAMUSCULAR | Status: AC | PRN
  Administered 2014-05-15: 50 ug via INTRAVENOUS
  Administered 2014-05-15 (×2): 25 ug via INTRAVENOUS

## 2014-05-15 MED ORDER — KETOROLAC TROMETHAMINE 30 MG/ML IJ SOLN
30.0000 mg | Freq: Four times a day (QID) | INTRAMUSCULAR | Status: AC | PRN
  Administered 2014-05-15 – 2014-05-19 (×7): 30 mg via INTRAVENOUS
  Filled 2014-05-15 (×7): qty 1

## 2014-05-15 MED ORDER — OXYCODONE HCL 5 MG PO TABS
10.0000 mg | ORAL_TABLET | ORAL | Status: DC | PRN
  Administered 2014-05-15 – 2014-05-22 (×30): 10 mg via ORAL
  Filled 2014-05-15 (×32): qty 2

## 2014-05-15 MED ORDER — FLUMAZENIL 0.5 MG/5ML IV SOLN
INTRAVENOUS | Status: AC
  Filled 2014-05-15: qty 5

## 2014-05-15 MED ORDER — NALOXONE HCL 0.4 MG/ML IJ SOLN
INTRAMUSCULAR | Status: AC
  Filled 2014-05-15: qty 1

## 2014-05-15 MED ORDER — MIDAZOLAM HCL 2 MG/2ML IJ SOLN
INTRAMUSCULAR | Status: AC
  Filled 2014-05-15: qty 6

## 2014-05-15 MED ORDER — HYDROMORPHONE HCL 1 MG/ML IJ SOLN
1.0000 mg | INTRAMUSCULAR | Status: DC | PRN
  Administered 2014-05-15 – 2014-05-22 (×7): 1 mg via INTRAVENOUS
  Filled 2014-05-15 (×7): qty 1

## 2014-05-15 NOTE — Progress Notes (Signed)
Follow-up requested 24-48 hours per [employer]. Patient admitted from office visit and currently admitted.

## 2014-05-15 NOTE — Progress Notes (Signed)
S/p cystolithalopaxy and right ureteral stent exchange with aspiration of 50cc of purulent fluid from right kidney on 05/13/14  Intv: Stable overnight Bladder spasms and ongoing pelvic pain U/s shows 6cm right renal abscess  PE: NAD Abdomen soft Foley draining clear yellow urine   Recent Labs  05/13/14 1700 05/14/14 0550 05/14/14 1907 05/15/14 0615  WBC 4.1 6.4  --  4.8  HGB 7.5* 6.8* 7.7* 7.6*  HCT 23.5* 20.6* 23.5* 22.9*    Recent Labs  05/13/14 1700 05/14/14 0550 05/15/14 0615  NA 132* 128* 129*  K 3.9 4.0 3.9  CL 96 92* 96  CO2 29 27 28   GLUCOSE 107* 95 127*  BUN 16 13 13   CREATININE 1.15 1.14 0.95  CALCIUM 9.0 8.0* 7.9*    Recent Labs  05/14/14 1858  INR 1.35   No results for input(s): LABURIN in the last 72 hours. Results for orders placed or performed during the hospital encounter of 05/13/14  Surgical pcr screen     Status: None   Collection Time: 05/13/14  3:58 PM  Result Value Ref Range Status   MRSA, PCR NEGATIVE NEGATIVE Final   Staphylococcus aureus NEGATIVE NEGATIVE Final    Comment:        The Xpert SA Assay (FDA approved for NASAL specimens in patients over 28 years of age), is one component of a comprehensive surveillance program.  Test performance has been validated by [employer] for patients greater than or equal to 56 year old. It is not intended to diagnose infection nor to guide or monitor treatment.      Imp:  Pyelitis with obstructed right ureteral stent secondary to bladder stone/stent encrustation and right renal abscess. Rec: would keep foley until after IR procedure, he's on their schedule today.  Ok to remove foley afterwards.  NPO. Broad spectrum ABx while urine cultures are pending.

## 2014-05-15 NOTE — Procedures (Signed)
Interventional Radiology Procedure Note  Procedure: US guided placement of a 2F drain into right renal abscess with aspiration of 40 mL foul smelling purulent material.  Sent for culture Complications: None Recommendations: - Maintain to JP bulb - Do NOT flush - Repeat renal US before removing drain  Signed,  Criselda Peaches, MD

## 2014-05-15 NOTE — Progress Notes (Signed)
Progress Note   Grant [name] EKC:[id number] DOB: 08-14-1955 DOA: 05/13/2014 PCP: MATTHEWS,MICHELLE A., MD   Brief Narrative:   Grant [name] is an 58 y.o. 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  He had a staging CT scandone 05/13/2014 which showed an obstructed right kidney due to stone and possible right renal abscess.  He subsequently underwent cystolithalopaxy and right ureteral stent exchange with aspiration of 50cc of purulent fluid from right kidney.   Assessment/Plan:   Principal Problem:  Ureteral obstruction, right / right hydronephrosis / right renal abscess  S/P cystolithalopaxy and right ureteral stent exchange with aspiration of 50cc of purulent fluid from right kidney.  IR consulted for percutaneous drainage for renal abscess, scheduled for today.  Continue Rocephin IV every 24 hours.  Continue supportive care with IV fluids, analgesia as needed.  Active Problems:   Cancer related pain  Change oxycodone to to 10 mg Q 4 hours PRN (from Q 6).  Add Toradol.   Anemia of chronic disease  Secondary to history of malignancy.  Given 1 unit of packed red blood cells 05/14/14 for hemoglobin of 6.8 mg/dL. Hemoglobin 7.6 this morning.   Severe protein-calorie malnutrition  In the context of chronic illness. Nutrition consulted.   CA of rectum  Management per  oncology.    DVT prophylaxis:   Lovenox SQ ordered.  Code Status: Full. Family Communication: Significant other updated at the bedside. Disposition Plan: Home when stable.   IV Access:    Porta-Cath   Procedures and diagnostic studies:   Ct Abdomen Pelvis W Contrast 05/13/2014: Moderate right-sided hydronephrosis with a 4.8 cm ill-defined cystic lesion in the anterior right kidney, generated mass-effect on the renal pelvis and intrarenal collecting system. This does not appear to be related to a dilated phalanx and intrarenal abscess would be indistinct consideration.  Interval development of a 3.3 cm intraluminal bladder stone around the formed loop of the right ureteral catheter.  Interval decrease in size of the large irregular rectal mass involving the pelvic floor.    Medical Consultants:    Dr. Louis Meckel, Urology.  Anti-Infectives:    Rocephin 05/13/14--->  Subjective:   Grant [name] feels fatigued.  "I'm tired and worn out".  Says he has fever/chills "all the time", awoke in a sweat overnight.  Ongoing bladder, lower back, hip and pelvic pain.  Legs also hurt.  Some nausea.  Objective:    Filed Vitals:   05/14/14 2117 05/15/14 0015 05/15/14 0350 05/15/14 0507  BP: 101/60   90/51  Pulse: 81   80  Temp: 98.4 F (36.9 C) 99.9 F (37.7 C) 97.8 F (36.6 C) 97.8 F (36.6 C)  TempSrc: Oral Oral Oral Oral  Resp: 16   16  Height:      Weight:    61.735 kg (136 lb 1.6 oz)  SpO2: 100%   99%    Intake/Output Summary (Last 24 hours) at 05/15/14 0806 Last data filed at 05/15/14 0615  Gross per 24  hour  Intake 2919.58 ml  Output   1800 ml  Net 1119.58 ml    Exam: Gen:  NAD, emaciated Cardiovascular:  RRR, No M/R/G, LLQ ostomy Respiratory:  Lungs CTAB Gastrointestinal:  Abdomen soft, NT/ND, + BS Extremities:  No C/E/C   Data Reviewed:    Labs: Basic Metabolic Panel:  Recent Labs Lab 05/12/14 1508  05/13/14 1700 05/14/14 0550  05/15/14 0615  NA 134*  --  132* 128* 129*  K 4.5  < > 3.9 4.0 3.9  CL  --   --  96 92* 96  CO2 32*  --  29 27 28   GLUCOSE 148*  --  107* 95 127*  BUN 15.7  --  16 13 13   CREATININE 1.2  --  1.15 1.14 0.95  CALCIUM 9.5  --  9.0 8.0* 7.9*  MG  --   --  1.9  --   --   PHOS  --   --  3.2  --   --   < > = values in this interval not displayed. GFR Estimated Creatinine Clearance: 74.9 mL/min (by C-G formula based on Cr of 0.95). Liver Function Tests:  Recent Labs Lab 05/12/14 1508 05/13/14 1700  AST 16 18  ALT 13 12  ALKPHOS 85 74  BILITOT 0.47 0.6  PROT 6.6 6.2  ALBUMIN 2.5* 2.7*   CBC:  Recent Labs Lab 05/12/14 1508 05/13/14 1700 05/14/14 0550 05/14/14 1907 05/15/14 0615  WBC 3.2* 4.1 6.4  --  4.8  NEUTROABS 2.6 3.4  --   --   --   HGB 8.2* 7.5* 6.8* 7.7* 7.6*  HCT 25.5* 23.5* 20.6* 23.5* 22.9*  MCV 84.0 86.7 86.2  --  85.8  PLT 380 285 200  --  181   Anemia work up:  Recent Labs  05/14/14 0730  VITAMINB12 1563*  FOLATE >20.0  FERRITIN 782*  TIBC Not calculated due to Iron <10.  IRON <10*  RETICCTPCT 2.2    Microbiology Recent Results (from the past 240 hour(s))  Surgical pcr screen     Status: None   Collection Time: 05/13/14  3:58 PM  Result Value Ref Range Status   MRSA, PCR NEGATIVE NEGATIVE Final   Staphylococcus aureus NEGATIVE NEGATIVE Final    Comment:        The Xpert SA Assay (FDA approved for NASAL specimens in patients over 59 years of age), is one component of a comprehensive surveillance program.  Test performance has been validated by [employer] for patients greater than or equal to 14 year old. It is not intended to diagnose infection nor to guide or monitor treatment.      Medications:   . cefTRIAXone (ROCEPHIN)  IV  1 g Intravenous Q24H  . enoxaparin (LOVENOX) injection  40 mg Subcutaneous Q24H  . lactose free nutrition  237 mL Oral QID  . pantoprazole  40 mg Oral Daily   Continuous Infusions: . sodium chloride  1,000 mL (05/14/14 2315)    Time spent: 25 minutes.   LOS: 2 days   RAMA,CHRISTINA  [facility] Pager [phone]. If unable to reach me by pager, please call my cell phone at [phone].  *Please refer to [email or web address], password [email or web address] to get updated schedule on who will round on this patient, as hospitalists switch teams weekly. If 7PM-7AM, please contact night-coverage at [email or web address], password [email or web address] for any overnight needs.  05/15/2014, 8:06 AM

## 2014-05-15 NOTE — Plan of Care (Signed)
Problem: Phase I Progression Outcomes Goal: Pain controlled with appropriate interventions Outcome: Progressing Patient reports that oxyir works "better" for pain than dilaudid 1mg . Paged NP on call and orders received for oxyir.

## 2014-05-16 DIAGNOSIS — Q6100 Congenital renal cyst, unspecified: Secondary | ICD-10-CM

## 2014-05-16 LAB — CBC
HEMATOCRIT: 24.1 % — AB (ref 39.0–52.0)
Hemoglobin: 7.6 g/dL — ABNORMAL LOW (ref 13.0–17.0)
MCH: 27.7 pg (ref 26.0–34.0)
MCHC: 31.5 g/dL (ref 30.0–36.0)
MCV: 88 fL (ref 78.0–100.0)
Platelets: 176 10*3/uL (ref 150–400)
RBC: 2.74 MIL/uL — ABNORMAL LOW (ref 4.22–5.81)
RDW: 20.7 % — AB (ref 11.5–15.5)
WBC: 3.9 10*3/uL — ABNORMAL LOW (ref 4.0–10.5)

## 2014-05-16 LAB — BASIC METABOLIC PANEL
Anion gap: 5 (ref 5–15)
BUN: 14 mg/dL (ref 6–23)
CO2: 27 mmol/L (ref 19–32)
CREATININE: 0.94 mg/dL (ref 0.50–1.35)
Calcium: 8.1 mg/dL — ABNORMAL LOW (ref 8.4–10.5)
Chloride: 101 mEq/L (ref 96–112)
Glucose, Bld: 134 mg/dL — ABNORMAL HIGH (ref 70–99)
Potassium: 4.1 mmol/L (ref 3.5–5.1)
SODIUM: 133 mmol/L — AB (ref 135–145)

## 2014-05-16 MED ORDER — GUAIFENESIN ER 600 MG PO TB12
600.0000 mg | ORAL_TABLET | Freq: Two times a day (BID) | ORAL | Status: DC
  Administered 2014-05-16 – 2014-05-22 (×13): 600 mg via ORAL
  Filled 2014-05-16 (×14): qty 1

## 2014-05-16 NOTE — Progress Notes (Signed)
Progress Note   Grant [name] YHC:[id number] DOB: 01/01/1956 DOA: 05/13/2014 PCP: MATTHEWS,MICHELLE A., MD   Brief Narrative:   Grant [name] is an 59 y.o. 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  He had a staging CT scandone 05/13/2014 which showed an obstructed right kidney due to stone and possible right renal abscess.  He subsequently underwent cystolithalopaxy and right ureteral stent exchange with aspiration of 50cc of purulent fluid from right kidney.   Assessment/Plan:   Principal Problem:  Ureteral obstruction, right / right hydronephrosis / right renal abscess  S/P cystolithalopaxy and right ureteral stent exchange with aspiration of 50cc of purulent fluid from right kidney.  S/P US guided placement of a 10 F drain into right renal abscess with aspiration of 40 mL of foul smelling pus.  Cultures sent.  Continue Rocephin IV every 24 hours.  Continue supportive care with IV fluids, analgesia as needed.  Active Problems:   Cancer related pain  Continue oxycodone 10 mg Q 4 hours PRN.  Dilaudid added for breakthrough pain.  Continue PRN Toradol.   Anemia of chronic disease  Secondary to history of malignancy.  Given 1 unit of packed red blood cells 05/14/14 for hemoglobin of 6.8 mg/dL. Hemoglobin 7.6 this morning.   Severe protein-calorie malnutrition  In the context  of chronic illness. Nutrition consulted.   CA of rectum  Management per oncology.    DVT prophylaxis:   Lovenox SQ ordered.  Code Status: Full. Family Communication: No family at bedside today. Disposition Plan: Home when stable.   IV Access:    Porta-Cath   Procedures and diagnostic studies:   Ct Abdomen Pelvis W Contrast 05/13/2014: Moderate right-sided hydronephrosis with a 4.8 cm ill-defined cystic lesion in the anterior right kidney, generated mass-effect on the renal pelvis and intrarenal collecting system. This does not appear to be related to a dilated phalanx and intrarenal abscess would be indistinct consideration.  Interval development of a 3.3 cm intraluminal bladder stone around the formed loop of the right ureteral catheter.  Interval decrease in size of the large irregular rectal mass involving the pelvic floor.    US Renal 05/14/2014: 1. Persistent 6.6   cm Right renal abscess. 2. Interval resolution of hydronephrosis.    [place] Abscess Drain 05/15/2014: 1. Successful placement of a 10 French percutaneous drainage catheter into the right renal abscess with aspiration of 40 mL of frankly purulent and very foul smelling fluid. A sample of fluid was sent for Gram stain and culture.  PLAN: 1. Maintain drainage tube to JP bulb suction. 2. Do not flush drainage catheter. Vigorous flushing could result in renal cortical injury and bleeding. 3. Recommend repeat renal ultrasound prior to tube removal to ensure resolution of the abscess collection.  Medical Consultants:    Dr. Louis Meckel, Urology.  Anti-Infectives:    Rocephin 05/13/14--->  Subjective:   Grant [name] says his hip pain is controlled.  Appetite is good.  Has some chest congestion, and RN did some  chest percussion and he was able to cough up sputum.  Bowels moving.  Ambulated yesterday.  Objective:    Filed Vitals:   05/15/14 2140 05/15/14 2152 05/16/14 0538 05/16/14 0712  BP:  95/53 107/61    Pulse: 70 78 75   Temp:  98.2 F (36.8 C) 97.9 F (36.6 C)   TempSrc:  Oral Oral   Resp:  16 16   Height:      Weight:    61.236 kg (135 lb)  SpO2:  100% 100%     Intake/Output Summary (Last 24 hours) at 05/16/14 0732 Last data filed at 05/16/14 0538  Gross per 24 hour  Intake 1876.25 ml  Output   2562 ml  Net -685.75 ml    Exam: Gen:  NAD, emaciated Cardiovascular:  RRR, No M/R/G, LLQ ostomy Respiratory:  Lungs course Gastrointestinal:  Abdomen soft, NT/ND, + BS Extremities:  No C/E/C   Data Reviewed:    Labs: Basic Metabolic Panel:  Recent Labs Lab 05/12/14 1508  05/13/14 1700 05/14/14 0550 05/15/14 0615 05/16/14 0348  NA 134*  --  132* 128* 129* 133*  K 4.5  < > 3.9 4.0 3.9 4.1  CL  --   --  96 92* 96 101  CO2 32*  --  29 27 28 27   GLUCOSE 148*  --  107* 95 127* 134*  BUN 15.7  --  16 13 13 14   CREATININE 1.2  --  1.15 1.14 0.95 0.94  CALCIUM 9.5  --  9.0 8.0* 7.9* 8.1*  MG  --   --  1.9  --   --   --   PHOS  --   --  3.2  --   --   --   < > = values in this interval not displayed. GFR Estimated Creatinine Clearance: 75.1 mL/min (by C-G formula based on Cr of 0.94). Liver Function Tests:  Recent Labs Lab 05/12/14 1508 05/13/14 1700  AST 16 18  ALT 13 12  ALKPHOS 85 74  BILITOT 0.47 0.6  PROT 6.6 6.2  ALBUMIN 2.5* 2.7*   CBC:  Recent Labs Lab 05/12/14 1508 05/13/14 1700 05/14/14 0550 05/14/14 1907 05/15/14 0615 05/16/14 0348  WBC 3.2* 4.1 6.4  --  4.8 3.9*  NEUTROABS 2.6 3.4  --   --   --   --   HGB 8.2* 7.5* 6.8* 7.7* 7.6* 7.6*  HCT 25.5* 23.5* 20.6* 23.5* 22.9* 24.1*  MCV 84.0 86.7 86.2  --  85.8 88.0  PLT 380 285 200  --  181 176   Anemia work up:  Recent Labs  05/14/14 0730  VITAMINB12 1563*  FOLATE >20.0  FERRITIN 782*  TIBC Not calculated due to Iron <10.  IRON <10*  RETICCTPCT 2.2    Microbiology Recent Results (from the past 240 hour(s))  Surgical pcr screen     Status: None   Collection Time: 05/13/14   3:58 PM  Result Value Ref Range Status   MRSA, PCR NEGATIVE NEGATIVE Final   Staphylococcus aureus NEGATIVE NEGATIVE Final    Comment:        The Xpert SA Assay (FDA approved for NASAL specimens in patients over 49 years of age), is one component of a comprehensive surveillance program.  Test performance has been validated by [employer] for patients greater than or equal to 18 year old. It is not intended to diagnose infection nor to guide or monitor treatment.   Urine culture  Status: None (Preliminary result)   Collection Time: 05/13/14  6:06 PM  Result Value Ref Range Status   Specimen Description URINE, CATHETERIZED  Final   Special Requests NONE  Final   Colony Count PENDING  Incomplete   Culture   Final    Culture reincubated for better growth Performed at [facility]    Report Status PENDING  Incomplete  Urine culture     Status: None (Preliminary result)   Collection Time: 05/13/14  6:29 PM  Result Value Ref Range Status   Specimen Description URINE, CATHETERIZED RIGHT RENAL PELVIS  Final   Special Requests NONE  Final   Colony Count PENDING  Incomplete   Culture   Final    Culture reincubated for better growth Performed at [facility]    Report Status PENDING  Incomplete  Urine culture     Status: None   Collection Time: 05/14/14  5:37 AM  Result Value Ref Range Status   Specimen Description URINE, CATHETERIZED  Final   Special Requests NONE  Final   Colony Count NO GROWTH Performed at [facility]   Final   Culture NO GROWTH Performed at [facility]   Final   Report Status 05/15/2014 FINAL  Final     Medications:   . cefTRIAXone (ROCEPHIN)  IV  1 g Intravenous Q24H  . enoxaparin (LOVENOX) injection  40 mg Subcutaneous Q24H  . lactose free nutrition  237 mL Oral QID  . pantoprazole  40 mg Oral Daily   Continuous Infusions: . sodium chloride 1,000 mL (05/16/14 0130)    Time spent: 25 minutes.   LOS: 3  days   [place] [facility] Pager [phone]. If unable to reach me by pager, please call my cell phone at [phone].  *Please refer to [email or web address], password [email or web address] to get updated schedule on who will round on this patient, as hospitalists switch teams weekly. If 7PM-7AM, please contact night-coverage at [email or web address], password [email or web address] for any overnight needs.  05/16/2014, 7:32 AM

## 2014-05-16 NOTE — Progress Notes (Signed)
Subjective: Pt ok. No new c/o   Objective: Physical Exam: BP 107/61 mmHg  Pulse 75  Temp(Src) 97.9 F (36.6 C) (Oral)  Resp 16  Ht 6\' 6"  (1.981 m)  Wt 135 lb (61.236 kg)  BMI 15.60 kg/m2  SpO2 100% (R)flank drain intact, site clean. Mildly tender Output serosanguinous, some stranding.    Labs: CBC  Recent Labs  05/15/14 0615 05/16/14 0348  WBC 4.8 3.9*  HGB 7.6* 7.6*  HCT 22.9* 24.1*  PLT 181 176   BMET  Recent Labs  05/15/14 0615 05/16/14 0348  NA 129* 133*  K 3.9 4.1  CL 96 101  CO2 28 27  GLUCOSE 127* 134*  BUN 13 14  CREATININE 0.95 0.94  CALCIUM 7.9* 8.1*   LFT  Recent Labs  05/13/14 1700  PROT 6.2  ALBUMIN 2.7*  AST 18  ALT 12  ALKPHOS 74  BILITOT 0.6   PT/INR  Recent Labs  05/14/14 1858  LABPROT 16.8*  INR 1.35     Studies/Results: [place] Abscess Drain  05/15/2014   CLINICAL DATA:  59 year old male with a large abscess in the right kidney despite adequate drainage via internal double-J ureteral stent. He presents for ultrasound guided abscess drain placement.  EXAM: ULTRASOUND GUIDED ABSCESS DRAINAGE  Date: 05/15/2014  PROCEDURE: 1. Ultrasound-guided placement of a 10 French drain into the renal abscess Interventional Radiologist:  Criselda Peaches, MD  ANESTHESIA/SEDATION: Moderate (conscious) sedation was used. Four mg Versed, 100 mcg Fentanyl were administered intravenously. The patient's vital signs were monitored continuously by radiology nursing throughout the procedure.  Sedation Time: 20 minutes  MEDICATIONS: None additional. Patient receiving Rocephin IV daily for antibiotic coverage.  TECHNIQUE: Informed consent was obtained from the patient following explanation of the procedure, risks, benefits and alternatives. The patient understands, agrees and consents for the procedure. All questions were addressed. A time out was performed.  The right flank was interrogated with ultrasound. The right kidney, of the liver and the colon  were all identified under anatomic positions noted. There is a large complex fluid collection within the kidney corresponding to the known renal abscess. A suitable skin entry site was selected and marked. The region was then sterilely prepped and draped in standard fashion using Betadine skin prep. Local anesthesia was attained by infiltration with 1% lidocaine. Under real-time sonographic guidance, an 18 gauge trocar needle was advanced through a short trans parenchymal tract and into the fluid collection. A short Amplatz wire was then coiled within the fluid collection in the skin tract was dilated to 10 [place].  A Cook 10 [place] all-purpose drainage catheter was then advanced over the wire and formed within the fluid collection under ultrasound guidance. Approximately 40 mL of foul-smelling frankly purulent fluid was then aspirated. The fluid was sent for Gram stain and culture.  The drainage catheter was then very gently flushed and connected to JP bulb suction. The catheter was secured to the skin with 0 Prolene suture and an adhesive fixation device. The patient tolerated the procedure well.  COMPLICATIONS: None  IMPRESSION: 1. Successful placement of a 10 French percutaneous drainage catheter into the right renal abscess with aspiration of 40 mL of frankly purulent and very foul smelling fluid. A sample of fluid was sent for Gram stain and culture.  PLAN: 1. Maintain drainage tube to JP bulb suction. 2. Do not flush drainage catheter. Vigorous flushing could result in renal cortical injury and bleeding. 3. Recommend repeat renal ultrasound prior to tube removal to  ensure resolution of the abscess collection. Signed,  Criselda Peaches, MD  [facility]  [facility]   Electronically Signed   By: Jacqulynn Cadet M.D.   On: 05/15/2014 17:50   US Renal  05/14/2014   CLINICAL DATA:  Renal abscess, hydronephrosis. Recent cystoscopic exchange of occluded  ureteral stent.  EXAM: RENAL/URINARY TRACT ULTRASOUND COMPLETE  COMPARISON:  CT 05/12/2014  FINDINGS: Right Kidney:  Length: 11 cm. There is a hypoechoic somewhat lobular 66 x 43 x 48 mm process in the mid upper pole right kidney corresponding to the low-attenuation collection seen on prior CT, not appreciably decompressed. Interval resolution of hydronephrosis. Echogenic regions in the collecting system may represent the double-J stent or gas from recent procedure.  Left Kidney:  Length: 12.3 cm. Echogenicity within normal limits. No mass or hydronephrosis visualized.  Bladder:  Decompressed by Foley catheter.  Distal end of double-J stent noted.  IMPRESSION: 1. Persistent 6.6   cm Right renal abscess. 2. Interval resolution of hydronephrosis.   Electronically Signed   By: Arne Cleveland M.D.   On: 05/14/2014 15:53    Assessment/Plan: (R)renal abscess s/p perc drain No internal flushing at this point. Instructed RN that she can flush tubing to keep free of debris IR following    LOS: 3 days    Ascencion Dike PA-C 05/16/2014 8:31 AM

## 2014-05-17 NOTE — Progress Notes (Signed)
S/p cystolithalopaxy and right ureteral stent exchange with aspiration of 50cc of purulent fluid from right kidney on 05/13/14  Intv: Stable overnight, pelvic pain, but flank pain significantly improved.  PE: NAD Abdomen soft Right flank drain with serosanguinous effluent Foley draining clear yellow urine   Recent Labs  05/14/14 1907 05/15/14 0615 05/16/14 0348  WBC  --  4.8 3.9*  HGB 7.7* 7.6* 7.6*  HCT 23.5* 22.9* 24.1*    Recent Labs  05/15/14 0615 05/16/14 0348  NA 129* 133*  K 3.9 4.1  CL 96 101  CO2 28 27  GLUCOSE 127* 134*  BUN 13 14  CREATININE 0.95 0.94  CALCIUM 7.9* 8.1*    Recent Labs  05/14/14 1858  INR 1.35   No results for input(s): LABURIN in the last 72 hours. Results for orders placed or performed during the hospital encounter of 05/13/14  Surgical pcr screen     Status: None   Collection Time: 05/13/14  3:58 PM  Result Value Ref Range Status   MRSA, PCR NEGATIVE NEGATIVE Final   Staphylococcus aureus NEGATIVE NEGATIVE Final    Comment:        The Xpert SA Assay (FDA approved for NASAL specimens in patients over 72 years of age), is one component of a comprehensive surveillance program.  Test performance has been validated by [employer] for patients greater than or equal to 60 year old. It is not intended to diagnose infection nor to guide or monitor treatment.   Urine culture     Status: None (Preliminary result)   Collection Time: 05/13/14  6:06 PM  Result Value Ref Range Status   Specimen Description URINE, CATHETERIZED  Final   Special Requests NONE  Final   Colony Count PENDING  Incomplete   Culture   Final    Culture reincubated for better growth Performed at [facility]    Report Status PENDING  Incomplete  Urine culture     Status: None (Preliminary result)   Collection Time: 05/13/14  6:29 PM  Result Value Ref Range Status   Specimen Description URINE, CATHETERIZED RIGHT RENAL PELVIS  Final   Special  Requests NONE  Final   Colony Count PENDING  Incomplete   Culture   Final    Culture reincubated for better growth Performed at [facility]    Report Status PENDING  Incomplete  Urine culture     Status: None   Collection Time: 05/14/14  5:37 AM  Result Value Ref Range Status   Specimen Description URINE, CATHETERIZED  Final   Special Requests NONE  Final   Colony Count NO GROWTH Performed at [facility]   Final   Culture NO GROWTH Performed at [facility]   Final   Report Status 05/15/2014 FINAL  Final     Imp:  Pyelitis with obstructed right ureteral stent secondary to bladder stone/stent encrustation and right renal abscess. Rec: renal abscess drain to gravity.  d/c foley catheter today.  Continue broad spectrum abx until culture returns.  Will continue to follow.

## 2014-05-17 NOTE — Progress Notes (Signed)
Progress Note   Grant [name] ZYS:[id number] DOB: 26-May-1955 DOA: 05/13/2014 PCP: MATTHEWS,MICHELLE A., MD   Brief Narrative:   Grant [name] is an 59 y.o. 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  He had a staging CT scandone 05/13/2014 which showed an obstructed right kidney due to stone and possible right renal abscess.  He subsequently underwent cystolithalopaxy and right ureteral stent exchange with aspiration of 50cc of purulent fluid from right kidney.   Assessment/Plan:   Principal Problem:  Ureteral obstruction, right / right hydronephrosis / right renal abscess  S/P cystolithalopaxy and right ureteral stent exchange with aspiration of 50cc of purulent fluid from right kidney.  S/P US guided placement of a 10 F drain into right renal abscess with aspiration of 40 mL of foul smelling pus.  Cultures pending.  Continue Rocephin IV every 24 hours.  Continue supportive care with IV fluids, analgesia as needed.  Active Problems:   Cancer related pain  Continue oxycodone 10 mg Q 4 hours PRN.  Dilaudid added for breakthrough pain.  Continue PRN Toradol.   Anemia of chronic disease  Secondary to history of malignancy.  Given 1 unit of packed red blood cells 05/14/14 for hemoglobin of 6.8 mg/dL. Hemoglobin stable.   Severe protein-calorie malnutrition  In the context of  chronic illness. Nutrition consulted.   CA of rectum  Management per oncology.    DVT prophylaxis:   Lovenox SQ ordered.  Code Status: Full. Family Communication: No family at bedside today. Disposition Plan: Home when stable.   IV Access:    Porta-Cath   Procedures and diagnostic studies:   Ct Abdomen Pelvis W Contrast 05/13/2014: Moderate right-sided hydronephrosis with a 4.8 cm ill-defined cystic lesion in the anterior right kidney, generated mass-effect on the renal pelvis and intrarenal collecting system. This does not appear to be related to a dilated phalanx and intrarenal abscess would be indistinct consideration.  Interval development of a 3.3 cm intraluminal bladder stone around the formed loop of the right ureteral catheter.  Interval decrease in size of the large irregular rectal mass involving the pelvic floor.    US Renal 05/14/2014: 1. Persistent 6.6   cm Right renal abscess. 2. Interval resolution of hydronephrosis.    [place] Abscess Drain 05/15/2014: 1. Successful placement of a 10 French percutaneous drainage catheter into the right renal abscess with aspiration of 40 mL of frankly purulent and very foul smelling fluid. A sample of fluid was sent for Gram stain and culture.  PLAN: 1. Maintain drainage tube to JP bulb suction. 2. Do not flush drainage catheter. Vigorous flushing could result in renal cortical injury and bleeding. 3. Recommend repeat renal ultrasound prior to tube removal to ensure resolution of the abscess collection.  Medical Consultants:    Dr. Louis Meckel, Urology.  Anti-Infectives:    Rocephin 05/13/14--->  Subjective:   Grant [name] has a cough with purulent sputum.  Hip pain controlled.  No N/V.   Objective:    Filed Vitals:  05/16/14 0712 05/16/14 1322 05/16/14 2132 05/17/14 0538  BP:  112/70 99/60 112/67  Pulse:  75 77 76  Temp:  98.3 F (36.8 C) 97.9 F (36.6 C) 98.3 F (36.8 C)  TempSrc:  Oral Oral Oral  Resp:   16 16 16   Height:      Weight: 61.236 kg (135 lb)   60.782 kg (134 lb)  SpO2:  100% 100% 100%    Intake/Output Summary (Last 24 hours) at 05/17/14 0810 Last data filed at 05/17/14 0539  Gross per 24 hour  Intake 2294.5 ml  Output   2930 ml  Net -635.5 ml    Exam: Gen:  NAD, emaciated Cardiovascular:  RRR, No M/R/G, LLQ ostomy Respiratory:  Lungs course with scattered rhonchi and wheezes Gastrointestinal:  Abdomen soft, NT/ND, + BS Extremities:  No C/E/C   Data Reviewed:    Labs: Basic Metabolic Panel:  Recent Labs Lab 05/12/14 1508  05/13/14 1700 05/14/14 0550 05/15/14 0615 05/16/14 0348  NA 134*  --  132* 128* 129* 133*  K 4.5  < > 3.9 4.0 3.9 4.1  CL  --   --  96 92* 96 101  CO2 32*  --  29 27 28 27   GLUCOSE 148*  --  107* 95 127* 134*  BUN 15.7  --  16 13 13 14   CREATININE 1.2  --  1.15 1.14 0.95 0.94  CALCIUM 9.5  --  9.0 8.0* 7.9* 8.1*  MG  --   --  1.9  --   --   --   PHOS  --   --  3.2  --   --   --   < > = values in this interval not displayed. GFR Estimated Creatinine Clearance: 74.6 mL/min (by C-G formula based on Cr of 0.94). Liver Function Tests:  Recent Labs Lab 05/12/14 1508 05/13/14 1700  AST 16 18  ALT 13 12  ALKPHOS 85 74  BILITOT 0.47 0.6  PROT 6.6 6.2  ALBUMIN 2.5* 2.7*   CBC:  Recent Labs Lab 05/12/14 1508 05/13/14 1700 05/14/14 0550 05/14/14 1907 05/15/14 0615 05/16/14 0348  WBC 3.2* 4.1 6.4  --  4.8 3.9*  NEUTROABS 2.6 3.4  --   --   --   --   HGB 8.2* 7.5* 6.8* 7.7* 7.6* 7.6*  HCT 25.5* 23.5* 20.6* 23.5* 22.9* 24.1*  MCV 84.0 86.7 86.2  --  85.8 88.0  PLT 380 285 200  --  181 176   Anemia work up: No results for input(s): VITAMINB12, FOLATE, FERRITIN, TIBC, IRON, RETICCTPCT in the last 72 hours.  Microbiology Recent Results (from the past 240 hour(s))  Surgical pcr screen     Status: None   Collection Time: 05/13/14  3:58 PM  Result Value Ref Range Status   MRSA, PCR NEGATIVE NEGATIVE Final   Staphylococcus  aureus NEGATIVE NEGATIVE Final    Comment:        The Xpert SA Assay (FDA approved for NASAL specimens in patients over 55 years of age), is one component of a comprehensive surveillance program.  Test performance has been validated by [employer] for patients greater than or equal to 56 year old. It is not intended to diagnose infection nor to guide or monitor treatment.   Urine culture     Status: None (Preliminary result)   Collection Time: 05/13/14  6:06 PM  Result Value Ref Range Status   Specimen Description URINE, CATHETERIZED  Final   Special Requests  NONE  Final   Colony Count PENDING  Incomplete   Culture   Final    Culture reincubated for better growth Performed at [facility]    Report Status PENDING  Incomplete  Urine culture     Status: None (Preliminary result)   Collection Time: 05/13/14  6:29 PM  Result Value Ref Range Status   Specimen Description URINE, CATHETERIZED RIGHT RENAL PELVIS  Final   Special Requests NONE  Final   Colony Count PENDING  Incomplete   Culture   Final    Culture reincubated for better growth Performed at [facility]    Report Status PENDING  Incomplete  Urine culture     Status: None   Collection Time: 05/14/14  5:37 AM  Result Value Ref Range Status   Specimen Description URINE, CATHETERIZED  Final   Special Requests NONE  Final   Colony Count NO GROWTH Performed at [facility]   Final   Culture NO GROWTH Performed at [facility]   Final   Report Status 05/15/2014 FINAL  Final     Medications:   . cefTRIAXone (ROCEPHIN)  IV  1 g Intravenous Q24H  . enoxaparin (LOVENOX) injection  40 mg Subcutaneous Q24H  . guaiFENesin  600 mg Oral BID  . lactose free nutrition  237 mL Oral QID  . pantoprazole  40 mg Oral Daily   Continuous Infusions: . sodium chloride 75 mL/hr at 05/17/14 0152    Time spent: 25 minutes.   LOS: 4 days   [place] [facility] Pager [phone].  If unable to reach me by pager, please call my cell phone at [phone].  *Please refer to [email or web address], password [email or web address] to get updated schedule on who will round on this patient, as hospitalists switch teams weekly. If 7PM-7AM, please contact night-coverage at [email or web address], password [email or web address] for any overnight needs.  05/17/2014, 8:10 AM

## 2014-05-18 LAB — CBC
HCT: 25.4 % — ABNORMAL LOW (ref 39.0–52.0)
Hemoglobin: 7.9 g/dL — ABNORMAL LOW (ref 13.0–17.0)
MCH: 27.6 pg (ref 26.0–34.0)
MCHC: 31.1 g/dL (ref 30.0–36.0)
MCV: 88.8 fL (ref 78.0–100.0)
PLATELETS: 226 10*3/uL (ref 150–400)
RBC: 2.86 MIL/uL — AB (ref 4.22–5.81)
RDW: 20.2 % — ABNORMAL HIGH (ref 11.5–15.5)
WBC: 3.5 10*3/uL — AB (ref 4.0–10.5)

## 2014-05-18 LAB — URINE CULTURE
Colony Count: 40000
Colony Count: >=100000

## 2014-05-18 LAB — BASIC METABOLIC PANEL
Anion gap: 4 — ABNORMAL LOW (ref 5–15)
BUN: 11 mg/dL (ref 6–23)
CALCIUM: 8.1 mg/dL — AB (ref 8.4–10.5)
CHLORIDE: 105 meq/L (ref 96–112)
CO2: 26 mmol/L (ref 19–32)
Creatinine, Ser: 0.92 mg/dL (ref 0.50–1.35)
GFR calc non Af Amer: >90 mL/min (ref >90)
GLUCOSE: 131 mg/dL — AB (ref 70–99)
Potassium: 4.7 mmol/L (ref 3.5–5.1)
Sodium: 135 mmol/L (ref 135–145)

## 2014-05-18 LAB — CULTURE, ROUTINE-ABSCESS: Special Requests: NORMAL

## 2014-05-18 NOTE — Progress Notes (Signed)
Progress Note   Grant [name] GEX:[id number] DOB: March 16, 1956 DOA: 05/13/2014 PCP: MATTHEWS,MICHELLE A., MD   Brief Narrative:   Grant [name] is an 59 y.o. 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  He had a staging CT scandone 05/13/2014 which showed an obstructed right kidney due to stone and possible right renal abscess.  He subsequently underwent cystolithalopaxy and right ureteral stent exchange with aspiration of 50cc of purulent fluid from right kidney.   Assessment/Plan:   Principal Problem:  Ureteral obstruction, right / right hydronephrosis / right renal abscess  S/P cystolithalopaxy and right ureteral stent exchange with aspiration of 50cc of purulent fluid from right kidney.  S/P US guided placement of a 10 F drain into right renal abscess with aspiration of 40 mL of foul smelling pus.  Cultures pending.  Continue Rocephin IV every 24 hours.  Continue supportive care with IV fluids, analgesia as needed.  Active Problems:   Cancer related pain  Continue oxycodone 10 mg Q 4 hours PRN.  Dilaudid added for breakthrough pain.  Continue PRN Toradol.   Anemia of chronic disease  Secondary to history of malignancy.  Given 1 unit of packed red blood cells 05/14/14 for hemoglobin of 6.8 mg/dL. Hemoglobin stable.   Severe protein-calorie malnutrition  In the context of  chronic illness. Nutrition consult pending.   CA of rectum  Management per oncology.    DVT prophylaxis:   Lovenox SQ ordered.  Code Status: Full. Family Communication: No family at bedside today. Disposition Plan: Home when stable.   IV Access:    Porta-Cath   Procedures and diagnostic studies:   Ct Abdomen Pelvis W Contrast 05/13/2014: Moderate right-sided hydronephrosis with a 4.8 cm ill-defined cystic lesion in the anterior right kidney, generated mass-effect on the renal pelvis and intrarenal collecting system. This does not appear to be related to a dilated phalanx and intrarenal abscess would be indistinct consideration.  Interval development of a 3.3 cm intraluminal bladder stone around the formed loop of the right ureteral catheter.  Interval decrease in size of the large irregular rectal mass involving the pelvic floor.    US Renal 05/14/2014: 1. Persistent 6.6   cm Right renal abscess. 2. Interval resolution of hydronephrosis.    [place] Abscess Drain 05/15/2014: 1. Successful placement of a 10 French percutaneous drainage catheter into the right renal abscess with aspiration of 40 mL of frankly purulent and very foul smelling fluid. A sample of fluid was sent for Gram stain and culture.  PLAN: 1. Maintain drainage tube to JP bulb suction. 2. Do not flush drainage catheter. Vigorous flushing could result in renal cortical injury and bleeding. 3. Recommend repeat renal ultrasound prior to tube removal to ensure resolution of the abscess collection.  Medical Consultants:    Dr. Louis Meckel, Urology.  Anti-Infectives:    Rocephin 05/13/14--->  Subjective:   Grant [name] is breathing better.  Hip/pelvic pain controlled.  Appetite good.  No N/V.  Urinating without difficulty since catheter removed.  No further hematuria.   Objective:    Filed Vitals:   05/17/14 0538 05/17/14 1500 05/17/14 2211 05/18/14 0700  BP: 112/67 125/82 110/73 112/78  Pulse: 76 86 83  80  Temp: 98.3 F (36.8 C) 98.4 F (36.9 C) 98.6 F (37 C) 98.5 F (36.9 C)  TempSrc: Oral Oral Oral Oral  Resp: 16 18 18 18   Height:      Weight: 60.782 kg (134 lb)   60.924 kg (134 lb 5 oz)  SpO2: 100% 100% 100% 100%    Intake/Output Summary (Last 24 hours) at 05/18/14 1028 Last data filed at 05/18/14 0700  Gross per 24 hour  Intake   1920 ml  Output   1781 ml  Net    139 ml    Exam: Gen:  NAD, emaciated Cardiovascular:  RRR, No M/R/G, LLQ ostomy Respiratory:  Lungs diminished but clear today. Gastrointestinal:  Abdomen soft, NT/ND, + BS Extremities:  No C/E/C   Data Reviewed:    Labs: Basic Metabolic Panel:  Recent Labs Lab 05/13/14 1700 05/14/14 0550 05/15/14 0615 05/16/14 0348 05/18/14 0610  NA 132* 128* 129* 133* 135  K 3.9 4.0 3.9 4.1 4.7  CL 96 92* 96 101 105  CO2 29 27 28 27 26   GLUCOSE 107* 95 127* 134* 131*  BUN 16 13 13 14 11   CREATININE 1.15 1.14 0.95 0.94 0.92  CALCIUM 9.0 8.0* 7.9* 8.1* 8.1*  MG 1.9  --   --   --   --   PHOS 3.2  --   --   --   --    GFR Estimated Creatinine Clearance: 76.3 mL/min (by C-G formula based on Cr of 0.92). Liver Function Tests:  Recent Labs Lab 05/12/14 1508 05/13/14 1700  AST 16 18  ALT 13 12  ALKPHOS 85 74  BILITOT 0.47 0.6  PROT 6.6 6.2  ALBUMIN 2.5* 2.7*   CBC:  Recent Labs Lab 05/12/14 1508  05/13/14 1700 05/14/14 0550 05/14/14 1907 05/15/14 0615 05/16/14 0348 05/18/14 0610  WBC 3.2*  --  4.1 6.4  --  4.8 3.9* 3.5*  NEUTROABS 2.6  --  3.4  --   --   --   --   --   HGB 8.2*  < > 7.5* 6.8* 7.7* 7.6* 7.6* 7.9*  HCT 25.5*  < > 23.5* 20.6* 23.5* 22.9* 24.1* 25.4*  MCV 84.0  --  86.7 86.2  --  85.8 88.0 88.8  PLT 380  --  285 200  --  181 176 226  < > = values in this interval not displayed.   Microbiology Recent Results (from the past 240 hour(s))  Surgical pcr screen     Status: None   Collection Time: 05/13/14  3:58 PM  Result Value Ref Range Status   MRSA, PCR NEGATIVE NEGATIVE  Final   Staphylococcus aureus NEGATIVE NEGATIVE Final    Comment:        The Xpert SA Assay (FDA approved for NASAL specimens in patients over 19 years of age), is one component of a comprehensive surveillance program.  Test performance has been validated by [employer] for patients greater than or equal to 45 year old. It is not intended to diagnose infection nor to guide or monitor treatment.   Urine culture     Status: None (Preliminary result)   Collection Time: 05/13/14  6:06 PM  Result Value Ref Range Status   Specimen Description URINE, CATHETERIZED  Final   Special Requests  NONE  Final   Colony Count PENDING  Incomplete   Culture   Final    Culture reincubated for better growth Performed at [facility]    Report Status PENDING  Incomplete  Urine culture     Status: None (Preliminary result)   Collection Time: 05/13/14  6:29 PM  Result Value Ref Range Status   Specimen Description URINE, CATHETERIZED RIGHT RENAL PELVIS  Final   Special Requests NONE  Final   Colony Count PENDING  Incomplete   Culture   Final    Culture reincubated for better growth Performed at [facility]    Report Status PENDING  Incomplete  Urine culture     Status: None   Collection Time: 05/14/14  5:37 AM  Result Value Ref Range Status   Specimen Description URINE, CATHETERIZED  Final   Special Requests NONE  Final   Colony Count NO GROWTH Performed at [facility]   Final   Culture NO GROWTH Performed at [facility]   Final   Report Status 05/15/2014 FINAL  Final     Medications:   . cefTRIAXone (ROCEPHIN)  IV  1 g Intravenous Q24H  . enoxaparin (LOVENOX) injection  40 mg Subcutaneous Q24H  . guaiFENesin  600 mg Oral BID  . lactose free nutrition  237 mL Oral QID  . pantoprazole  40 mg Oral Daily   Continuous Infusions: . sodium chloride 75 mL/hr at 05/18/14 0401    Time spent: 25 minutes.   LOS: 5 days   [facility] Pager [phone]. If unable to reach me by pager, please call my cell phone at [phone].  *Please refer to [email or web address], password [email or web address] to get updated schedule on who will round on this patient, as hospitalists switch teams weekly. If 7PM-7AM, please contact night-coverage at [email or web address], password [email or web address] for any overnight needs.  05/18/2014, 10:28 AM

## 2014-05-18 NOTE — Progress Notes (Signed)
Subjective: Pt ok. No new c/o Feeling better   Objective: Physical Exam: BP 112/78 mmHg  Pulse 80  Temp(Src) 98.5 F (36.9 C) (Oral)  Resp 18  Ht 6\' 6"  (1.981 m)  Wt 134 lb 5 oz (60.924 kg)  BMI 15.52 kg/m2  SpO2 100% (R)flank drain intact, site clean. Mildly tender Output serosanguinous, some stranding.  Output past 48 hrs has decreased.   Labs: CBC  Recent Labs  05/16/14 0348 05/18/14 0610  WBC 3.9* 3.5*  HGB 7.6* 7.9*  HCT 24.1* 25.4*  PLT 176 226   BMET  Recent Labs  05/16/14 0348 05/18/14 0610  NA 133* 135  K 4.1 4.7  CL 101 105  CO2 27 26  GLUCOSE 134* 131*  BUN 14 11  CREATININE 0.94 0.92  CALCIUM 8.1* 8.1*    Studies/Results: No results found.  Assessment/Plan: (R)renal abscess s/p perc drain No internal flushing at this point. Instructed RN that she can flush tubing to keep free of debris IR following If output remains low, recommend repeat imaging (CT or [place]) to re-eval abscess prior to drain removal.    LOS: 5 days    Ascencion Dike PA-C 05/18/2014 11:15 AM

## 2014-05-19 MED ORDER — LEVOFLOXACIN IN D5W 750 MG/150ML IV SOLN
750.0000 mg | INTRAVENOUS | Status: DC
  Administered 2014-05-19 – 2014-05-22 (×4): 750 mg via INTRAVENOUS
  Filled 2014-05-19 (×4): qty 150

## 2014-05-19 NOTE — Progress Notes (Signed)
Progress Note   Grant [name] BSJ:[id number] DOB: 1956-03-07 DOA: 05/13/2014 PCP: MATTHEWS,MICHELLE A., MD   Brief Narrative:   Grant [name] is an 59 y.o. 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  He had a staging CT scandone 05/13/2014 which showed an obstructed right kidney due to stone and possible right renal abscess.  He subsequently underwent cystolithalopaxy and right ureteral stent exchange with aspiration of 50cc of purulent fluid from right kidney. Cultures have returned with enterococcus/Escherichia coli in urine and microaerophilic strep and abscess. Barrier to discharge is significant weakness and plans for a follow-up CT/ultrasound in next 24-48 hours per IR recommendations to determine if his abscess drain can be removed.  Assessment/Plan:   Principal Problem:  Ureteral obstruction, right / right hydronephrosis / right renal abscess  S/P cystolithalopaxy and right ureteral stent exchange with aspiration of 50cc of purulent fluid from right kidney.  S/P US guided placement of a 10 F drain into right renal abscess with aspiration of 40 mL of foul smelling pus.  Initially treated with Rocephin. Urine Cultures grew Escherichia coli and enterococcus. Abscess culture grew microaerophilic strep. Will change antibiotics to Levaquin.  Continue supportive care with IV fluids, analgesia as  needed.  Active Problems:   Cancer related pain  Continue oxycodone 10 mg Q 4 hours PRN.  Dilaudid added for breakthrough pain.  Continue PRN Toradol.  Pain controlled.   Anemia of chronic disease  Secondary to history of malignancy.  Given 1 unit of packed red blood cells 05/14/14 for hemoglobin of 6.8 mg/dL. Hemoglobin stable.   Severe protein-calorie malnutrition  In the context of chronic illness. Being followed by the dietitian with recommendations for ongoing boost supplements.   CA of rectum  Management per oncology.    DVT prophylaxis:   Lovenox SQ ordered.  Code Status: Full. Family Communication: No family at bedside today. Disposition Plan: Home when stable.   IV Access:    Porta-Cath   Procedures and diagnostic studies:   Ct Abdomen Pelvis W Contrast 05/13/2014: Moderate right-sided hydronephrosis with a 4.8 cm ill-defined cystic lesion in the anterior right kidney, generated mass-effect on the renal pelvis and intrarenal collecting system. This does not appear to be related to a dilated phalanx and intrarenal abscess would be indistinct consideration.  Interval development of a 3.3 cm intraluminal bladder stone around the formed loop of the right ureteral catheter.  Interval decrease in size of the large irregular rectal mass involving the pelvic floor.    US Renal 05/14/2014: 1. Persistent 6.6   cm Right renal abscess. 2. Interval resolution of hydronephrosis.    [place] Abscess Drain 05/15/2014: 1. Successful placement of a 10 French percutaneous drainage catheter into the right renal abscess with aspiration of 40 mL of frankly purulent and very foul smelling fluid. A sample of fluid was sent for Gram stain and culture.  PLAN: 1. Maintain drainage tube to JP bulb suction. 2. Do not flush drainage catheter. Vigorous flushing could result in renal  cortical injury and bleeding. 3. Recommend repeat renal ultrasound prior to tube removal to ensure resolution of  the abscess collection.  Medical Consultants:    Dr. Louis Meckel, Urology.  Interventional radiology  Anti-Infectives:    Rocephin 05/13/14---> 05/19/14  Levaquin 05/19/14--->  Subjective:   Grant [name] says he feels very weak after working with the physical therapist today. He tells me he is gaining weight and that his appetite is good. His flank pain is much improved, but continues to report pain from his "tumor ". No nausea or vomiting. Dyspnea improved.   Objective:    Filed Vitals:   05/18/14 1430 05/18/14 2120 05/19/14 0451 05/19/14 0643  BP: 129/82 124/71 117/68   Pulse: 88 81 76   Temp: 98.1 F (36.7 C) 98.1 F (36.7 C) 98.4 F (36.9 C)   TempSrc: Oral Oral Oral   Resp: 18 16 16    Height:      Weight:    66.407 kg (146 lb 6.4 oz)  SpO2: 100% 100% 100%     Intake/Output Summary (Last 24 hours) at 05/19/14 0755 Last data filed at 05/19/14 4696  Gross per 24 hour  Intake   1840 ml  Output   3307 ml  Net  -1467 ml    Exam: Gen:  NAD, emaciated Cardiovascular:  RRR, No M/R/G Respiratory:  Lungs with an occasional faint wheeze. Gastrointestinal:  Abdomen soft, NT/ND, + BS, ostomy left lower quadrant Extremities:  No C/E/C   Data Reviewed:    Labs: Basic Metabolic Panel:  Recent Labs Lab 05/13/14 1700 05/14/14 0550 05/15/14 0615 05/16/14 0348 05/18/14 0610  NA 132* 128* 129* 133* 135  K 3.9 4.0 3.9 4.1 4.7  CL 96 92* 96 101 105  CO2 29 27 28 27 26   GLUCOSE 107* 95 127* 134* 131*  BUN 16 13 13 14 11   CREATININE 1.15 1.14 0.95 0.94 0.92  CALCIUM 9.0 8.0* 7.9* 8.1* 8.1*  MG 1.9  --   --   --   --   PHOS 3.2  --   --   --   --    GFR Estimated Creatinine Clearance: 83.2 mL/min (by C-G formula based on Cr of 0.92). Liver Function Tests:  Recent Labs Lab 05/12/14 1508 05/13/14 1700  AST 16 18  ALT 13 12  ALKPHOS 85 74  BILITOT 0.47 0.6  PROT 6.6 6.2  ALBUMIN 2.5* 2.7*   CBC:  Recent Labs Lab 05/12/14 1508   05/13/14 1700 05/14/14 0550 05/14/14 1907 05/15/14 0615 05/16/14 0348 05/18/14 0610  WBC 3.2*  --  4.1 6.4  --  4.8 3.9* 3.5*  NEUTROABS 2.6  --  3.4  --   --   --   --   --   HGB 8.2*  < > 7.5* 6.8* 7.7* 7.6* 7.6* 7.9*  HCT 25.5*  < > 23.5* 20.6* 23.5* 22.9* 24.1* 25.4*  MCV 84.0  --  86.7 86.2  --  85.8 88.0 88.8  PLT 380  --  285 200  --  181 176 226  < > = values in this interval not displayed.   Microbiology Recent Results (from the past 240 hour(s))  Surgical pcr screen     Status: None   Collection Time: 05/13/14  3:58 PM  Result Value Ref Range Status   MRSA, PCR NEGATIVE NEGATIVE Final   Staphylococcus aureus NEGATIVE NEGATIVE Final    Comment:        The Xpert SA Assay (FDA approved  for NASAL specimens in patients over 57 years of age), is one component of a comprehensive surveillance program.  Test performance has been validated by [employer] for patients greater than or equal to 56 year old. It is not intended to diagnose infection nor to guide or monitor treatment.   Urine culture     Status: None   Collection Time: 05/13/14  6:06 PM  Result Value Ref Range Status   Specimen Description URINE, CATHETERIZED  Final   Special Requests NONE  Final   Colony Count   Final    40,000 COLONIES/ML Performed at [facility]    Culture   Final    ESCHERICHIA COLI ENTEROCOCCUS SPECIES Performed at [facility]    Report Status 05/18/2014 FINAL  Final   Organism ID, Bacteria ESCHERICHIA COLI  Final   Organism ID, Bacteria ENTEROCOCCUS SPECIES  Final      Susceptibility   Escherichia coli - MIC*    AMPICILLIN <=2 SENSITIVE Sensitive     CEFAZOLIN <=4 SENSITIVE Sensitive     CEFTRIAXONE <=1 SENSITIVE Sensitive     CIPROFLOXACIN <=0.25 SENSITIVE Sensitive     GENTAMICIN <=1 SENSITIVE Sensitive     LEVOFLOXACIN <=0.12 SENSITIVE Sensitive     NITROFURANTOIN <=16 SENSITIVE Sensitive     TOBRAMYCIN <=1 SENSITIVE Sensitive     TRIMETH/SULFA <=20  SENSITIVE Sensitive     PIP/TAZO <=4 SENSITIVE Sensitive     * ESCHERICHIA COLI   Enterococcus species - MIC*    AMPICILLIN <=2 SENSITIVE Sensitive     LEVOFLOXACIN 1 SENSITIVE Sensitive     NITROFURANTOIN <=16 SENSITIVE Sensitive     VANCOMYCIN 2 SENSITIVE Sensitive     TETRACYCLINE <=1 SENSITIVE Sensitive     * ENTEROCOCCUS SPECIES  Urine culture     Status: None   Collection Time: 05/13/14  6:29 PM  Result Value Ref Range Status   Specimen Description URINE, CATHETERIZED RIGHT RENAL PELVIS  Final   Special Requests NONE  Final   Colony Count   Final    >=100,000 COLONIES/ML Performed at [facility]    Culture   Final    STREPTOCOCCUS GROUP D;high probability for S.bovis Performed at [facility]    Report Status 05/18/2014 FINAL  Final   Organism ID, Bacteria STREPTOCOCCUS GROUP D;high probability for S.bovis  Final      Susceptibility   Streptococcus group d;high probability for s.bovis - MIC (ETEST)*    PENICILLIN 0.125 SENSITIVE Sensitive     * STREPTOCOCCUS GROUP D;high probability for S.bovis  Urine culture     Status: None   Collection Time: 05/14/14  5:37 AM  Result Value Ref Range Status   Specimen Description URINE, CATHETERIZED  Final   Special Requests NONE  Final   Colony Count NO GROWTH Performed at [facility]   Final   Culture NO GROWTH Performed at [facility]   Final   Report Status 05/15/2014 FINAL  Final  Culture, routine-abscess     Status: None   Collection Time: 05/15/14 11:41 AM  Result Value Ref Range Status   Specimen Description ABSCESS RT KIDNEY  Final   Special Requests Normal  Final   Gram Stain   Final    ABUNDANT WBC PRESENT,BOTH PMN AND MONONUCLEAR NO SQUAMOUS EPITHELIAL CELLS SEEN ABUNDANT GRAM POSITIVE COCCI IN PAIRS IN CLUSTERS FEW GRAM NEGATIVE RODS Performed at [facility]    Culture   Final    ABUNDANT MICROAEROPHILIC  STREPTOCOCCI Note: Standardized susceptibility testing for  this organism is not available. Performed at [facility]    Report Status 05/18/2014 FINAL  Final     Medications:   . cefTRIAXone (ROCEPHIN)  IV  1 g Intravenous Q24H  . enoxaparin (LOVENOX) injection  40 mg Subcutaneous Q24H  . guaiFENesin  600 mg Oral BID  . lactose free nutrition  237 mL Oral QID  . pantoprazole  40 mg Oral Daily   Continuous Infusions: . sodium chloride 75 mL/hr at 05/18/14 1723    Time spent: 25 minutes.   LOS: 6 days   [place] [facility] Pager [phone]. If unable to reach me by pager, please call my cell phone at [phone].  *Please refer to [email or web address], password [email or web address] to get updated schedule on who will round on this patient, as hospitalists switch teams weekly. If 7PM-7AM, please contact night-coverage at [email or web address], password [email or web address] for any overnight needs.  05/19/2014, 7:55 AM
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
## 2014-05-19 NOTE — Progress Notes (Signed)
NUTRITION FOLLOW-UP  INTERVENTION: -Continue Boost Plus QID, each supplement provides 360 kcal, 14 gram protein -RD to provide nutrition coupons for Boost supplement prior to d/c  NUTRITION DIAGNOSIS: Unintentional wt loss related to inadequate oral intake/chronic disease as evidenced by 30 lb wt loss in 6 months, continues  Goal: Pt to meet >/= 90% of their estimated nutrition needs   Monitor:  Total protein/energy intake, labs, weights, Gi profile, education needs  ASSESSMENT: 59 year old male with past medical history of rectal cancer (had initiation of radiation and Xeloda 12/18/2013, completed 01/29/2014), restaging CT evaluation at [facility] showed decrease in size of the rectosigmoid mass post radiation changes, has had chemotherapy with Capox starting 03/13/2014 (cycle 3 given 04/24/2014), history of right hydronephrosis secondary to the obstructing pelvic mass, status post placement of a right percutaneous nephrostomy tube followed by a right ureter stent, 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 Another RD initially assessed pt on 12/30. Pt was diagnosed with severe malnutrition and was ordered Boost Plus QID.  PO intake: 85% of regular diet.  Pt is drinking Boost Plus supplements. RD to continue current interventions.  Labs reviewed: Glucose 131  Height: Ht Readings from Last 1 Encounters:  05/13/14 6\' 6"  (1.981 m)    Weight: Wt Readings from Last 1 Encounters:  05/19/14 146 lb 6.4 oz (66.407 kg)    BMI:  Body mass index is 16.92 kg/(m^2). Underweight  Estimated Nutritional Needs: Kcal: 2100-2300 Protein: 120-135 gram Fluid: >/=2100 ml daily  Skin: WDL  Diet Order: Diet regular  EDUCATION NEEDS: -Education needs addressed   Intake/Output Summary (Last 24 hours) at 05/19/14 1352 Last data filed at 05/19/14 1121  Gross per 24 hour  Intake    720 ml  Output   2876 ml  Net  -2156 ml     Last BM: 1/4- colostomy   Labs:   Recent Labs Lab 05/13/14 1700  05/15/14 0615 05/16/14 0348 05/18/14 0610  NA 132*  < > 129* 133* 135  K 3.9  < > 3.9 4.1 4.7  CL 96  < > 96 101 105  CO2 29  < > 28 27 26   BUN 16  < > 13 14 11   CREATININE 1.15  < > 0.95 0.94 0.92  CALCIUM 9.0  < > 7.9* 8.1* 8.1*  MG 1.9  --   --   --   --   PHOS 3.2  --   --   --   --   GLUCOSE 107*  < > 127* 134* 131*  < > = values in this interval not displayed.  CBG (last 3)  No results for input(s): GLUCAP in the last 72 hours.  Scheduled Meds: . enoxaparin (LOVENOX) injection  40 mg Subcutaneous Q24H  . guaiFENesin  600 mg Oral BID  . lactose free nutrition  237 mL Oral QID  . levofloxacin (LEVAQUIN) IV  750 mg Intravenous Q24H  . pantoprazole  40 mg Oral Daily    Continuous Infusions: . sodium chloride 75 mL/hr at 05/18/14 1723   Clayton Bibles, MS, RD, LDN Pager: [phone] After Hours Pager: [phone]

## 2014-05-19 NOTE — Progress Notes (Signed)
Patient ID: Lenus Trauger, male   DOB: 12/05/1955, 59 y.o.   MRN: [id number]   6 Days Post-Op Subjective: Pt known to me from prior admission last spring.  He was admitted with right flank pain, anorexia, and general malaise.  He has been treated with neoadjuvant chemotherapy and radiation and is apparently scheduled for surgical treatment of his large rectal adenocarcinoma at [facility] in the near future. He had a right percutaneous nephrostomy tube placed last spring for right ureteral obstruction.  This was apparently converted to an antegrade indwelling stent.  Pt did not follow up with me and apparently has not seen Urology at [facility]. He was found to have a large bladder stone on the distal curl of his stent and this was managed by Dr. Louis Meckel last week.  In addition, he was found to have a right renal abscess.  He underwent percutaneous drainage and is on levofloxacin for his cultures that were positive for Enterococcus and E. Coli.  Pt feeling better with less flank pain since drain placed on 12/31.  Also voiding better since stent changed last week by Dr. Louis Meckel.  Objective: Vital signs in last 24 hours: Temp:  [98.1 F (36.7 C)-98.4 F (36.9 C)] 98.2 F (36.8 C) (01/04 1412) Pulse Rate:  [76-86] 86 (01/04 1412) Resp:  [16] 16 (01/04 1412) BP: (117-135)/(64-71) 135/64 mmHg (01/04 1412) SpO2:  [100 %] 100 % (01/04 1412) Weight:  [66.407 kg (146 lb 6.4 oz)] 66.407 kg (146 lb 6.4 oz) (01/04 0643)  Intake/Output from previous day: 01/03 0701 - 01/04 0700 In: [place] [P.O.:1840] Out: 3307 [Urine:3300; Drains:5; Stool:2] Intake/Output this shift: Total I/O In: 240 [P.O.:240] Out: 600 [Urine:600]  Physical Exam:  General: Alert and oriented CV: RRR Lungs: Clear Abdomen: Soft, ND, minimal right flank pain. Right flank drain with minimal bloody drainage.  Lab Results:  Recent Labs  05/18/14 0610  HGB 7.9*  HCT 25.4*   CBC Latest Ref Rng 05/18/2014 05/16/2014 05/15/2014   WBC 4.0 - 10.5 K/uL 3.5(L) 3.9(L) 4.8  Hemoglobin 13.0 - 17.0 g/dL 7.9(L) 7.6(L) 7.6(L)  Hematocrit 39.0 - 52.0 % 25.4(L) 24.1(L) 22.9(L)  Platelets 150 - 400 K/uL 226 176 181      BMET  Recent Labs  05/18/14 0610  NA 135  K 4.7  CL 105  CO2 26  GLUCOSE 131*  BUN 11  CREATININE 0.92  CALCIUM 8.1*     Studies/Results: No results found.  Assessment/Plan: 1) Rectal adenocarcinoma with right ureteral obstruction: Pt is tentatively scheduled to undergo surgical treatment by Dr. Andreas Newport at [facility] following neoadjuvant chemotherapy completion.  He has not yet seen [facility] and will need to be seen by them prior to any planned surgery as he is at risk for needing a cystectomy and urinary diversion at that time.  There has also been concern of a possible fistula that would need evaluation prior to any planned procedure.  2) Right renal abscess: Continue drainage.  Repeat CT of abdomen tomorrow with IV contrast to reassess abscess and see if it has been adequately drained.  If so and if drainage becomes decreased from drain, can consider removal of drain at that time.  He will need 6 weeks of culture specific antibiotics (levofloxacin) and will need repeat renal imaging at that time to ensure complete resolution.  This needs to be resolved prior to any planned surgery for his malignancy to avoid any additional complications at that time.   LOS: 6 days  Eisa Conaway,LES 05/19/2014, 4:50 PM

## 2014-05-19 NOTE — Progress Notes (Signed)
6 Days Post-Op  Subjective: Pt doing well; has some mild rt flank discomfort but overall feels better  Objective: Vital signs in last 24 hours: Temp:  [98.1 F (36.7 C)-98.4 F (36.9 C)] 98.4 F (36.9 C) (01/04 0451) Pulse Rate:  [76-88] 76 (01/04 0451) Resp:  [16-18] 16 (01/04 0451) BP: (117-129)/(68-82) 117/68 mmHg (01/04 0451) SpO2:  [100 %] 100 % (01/04 0451) Weight:  [146 lb 6.4 oz (66.407 kg)] 146 lb 6.4 oz (66.407 kg) (01/04 0643) Last BM Date: 05/18/14  Intake/Output from previous day: 01/03 0701 - 01/04 0700 In: [place] [P.O.:1840] Out: 3307 [Urine:3300; Drains:5; Stool:2] Intake/Output this shift:    Rt renal abscess drain intact, insertion site mildly tender, output minimal amt bloody fluid; cx's- strept  Lab Results:   Recent Labs  05/18/14 0610  WBC 3.5*  HGB 7.9*  HCT 25.4*  PLT 226   BMET  Recent Labs  05/18/14 0610  NA 135  K 4.7  CL 105  CO2 26  GLUCOSE 131*  BUN 11  CREATININE 0.92  CALCIUM 8.1*   PT/INR No results for input(s): LABPROT, INR in the last 72 hours. ABG No results for input(s): PHART, HCO3 in the last 72 hours.  Invalid input(s): PCO2, PO2  Studies/Results: No results found.  Anti-infectives: Anti-infectives    Start     Dose/Rate Route Frequency Ordered Stop   05/19/14 0900  levofloxacin (LEVAQUIN) IVPB 750 mg     750 mg100 mL/hr over 90 Minutes Intravenous Every 24 hours 05/19/14 0757     05/13/14 1800  cefTRIAXone (ROCEPHIN) 1 g in dextrose 5 % 50 mL IVPB - Premix  Status:  Discontinued     1 g100 mL/hr over 30 Minutes Intravenous Every 24 hours 05/13/14 1650 05/19/14 0757   05/13/14 1800  gentamicin (GARAMYCIN) 240 mg in dextrose 5 % 100 mL IVPB     240 mg106 mL/hr over 60 Minutes Intravenous STAT 05/13/14 1759 05/13/14 1820      Assessment/Plan: S/p right renal abscess drain 12/31; recommend f/u CT/US in next 24-48 hours if output remains low before considering drain removal   LOS: 6 days    Railynn Ballo,D  [facility] 05/19/2014

## 2014-05-20 ENCOUNTER — Encounter (HOSPITAL_COMMUNITY): Payer: Self-pay | Admitting: Radiology

## 2014-05-20 ENCOUNTER — Inpatient Hospital Stay (HOSPITAL_COMMUNITY): Payer: Medicaid Other

## 2014-05-20 MED ORDER — SODIUM CHLORIDE 0.9 % IJ SOLN
INTRAMUSCULAR | Status: AC
  Administered 2014-05-21: 10 mL
  Filled 2014-05-20: qty 10

## 2014-05-20 MED ORDER — IOHEXOL 300 MG/ML  SOLN
100.0000 mL | Freq: Once | INTRAMUSCULAR | Status: AC | PRN
  Administered 2014-05-20: 100 mL via INTRAVENOUS

## 2014-05-20 NOTE — Plan of Care (Signed)
Problem: Phase I Progression Outcomes Goal: OOB as tolerated unless otherwise ordered Outcome: Progressing Working with PT

## 2014-05-20 NOTE — Progress Notes (Signed)
Physical Therapy Treatment Patient Details Name: Grant [name] MRN: [id number] DOB: 04-19-1956 Today's Date: 05/20/2014    History of Present Illness Grant [name] is an 59 y.o. 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  He had a staging CT scan done 05/13/2014 which showed an obstructed right kidney due to stone and possible right renal abscess.  He subsequently underwent cystolithalopaxy and right ureteral stent exchange with aspiration of 50cc of purulent fluid from right kidney.    PT Comments    Pt brought in his fathers old RW from home.  Looks to be in good condition and appropriate height.  Pt tolerated amb greater distance and had no nausea.  Good safety cognition.  Follow Up Recommendations  Home health PT     Equipment Recommendations   (pt brought in his fathers RW)    Recommendations for Other Services       Precautions / Restrictions Precautions Precautions: Fall Precaution Comments: R drain Restrictions Weight Bearing Restrictions: No    Mobility  Bed Mobility Overal bed mobility: Modified Independent                Transfers Overall transfer level: Needs assistance Equipment used: None Transfers: Sit to/from Stand Sit to Stand: Min guard         General transfer comment: good use of hands and good safety cognition  Ambulation/Gait Ambulation/Gait assistance:  Min guard Ambulation Distance (Feet): 500 Feet Assistive device: Rolling walker (2 wheeled);Straight cane Gait Pattern/deviations: Step-through pattern;Decreased stride length Gait velocity: decreased   General Gait Details: pt tolerated amb around full unit with increased time and x 3 standing rest breaks.  "feels good to walk".  no dizziness and no LOB.     Stairs            Wheelchair Mobility    Modified Rankin (Stroke Patients Only)       Balance                                    Cognition                            Exercises      General Comments        Pertinent Vitals/Pain Pain Assessment: No/denies pain    Home Living                      Prior Function            PT Goals (current goals can now be found in the care plan section) Progress towards PT goals: Progressing toward goals    Frequency  Min 3X/week    PT Plan      Co-evaluation             End of Session Equipment Utilized During Treatment: Gait belt Activity Tolerance: Patient tolerated treatment well Patient left:  in bed;with call bell/phone within reach     Time: 1450-1515 PT Time Calculation (min) (ACUTE ONLY): 25 min  Charges:  $Gait Training: 23-37 mins                    G Codes:      Rica Koyanagi  PTA [facility]  Acute  Rehab Pager      [phone]

## 2014-05-20 NOTE — Progress Notes (Signed)
Patient ID: Matthan Sledge, male   DOB: Oct 02, 1955, 59 y.o.   MRN: [id number]  7 Days Post-Op Subjective: Pt without new complaints.  Objective: Vital signs in last 24 hours: Temp:  [97.8 F (36.6 C)-98.2 F (36.8 C)] 98.2 F (36.8 C) (01/05 0426) Pulse Rate:  [72-86] 78 (01/05 0426) Resp:  [16-18] 16 (01/05 0426) BP: (121-135)/(64-84) 123/72 mmHg (01/05 0426) SpO2:  [100 %] 100 % (01/05 0426)  Intake/Output from previous day: 01/04 0701 - 01/05 0700 In: 5519.3 [P.O.:780; I.V.:4739.3] Out: 2105 [Urine:2100; Drains:5] Intake/Output this shift:    Physical Exam:  General: Alert and oriented Right flank drain with minimal output  Lab Results:  Recent Labs  05/18/14 0610  HGB 7.9*  HCT 25.4*   Lab Results  Component Value Date   WBC 3.5* 05/18/2014   HGB 7.9* 05/18/2014   HCT 25.4* 05/18/2014   MCV 88.8 05/18/2014   PLT 226 05/18/2014    BMET  Recent Labs  05/18/14 0610  NA 135  K 4.7  CL 105  CO2 26  GLUCOSE 131*  BUN 11  CREATININE 0.92  CALCIUM 8.1*     Studies/Results: No results found.  Assessment/Plan: 1) Rectal adenocarcinoma with right ureteral obstruction: Pt is tentatively scheduled to undergo surgical treatment by Dr. Rolla Etienne at [facility] following neoadjuvant chemotherapy completion. He has not yet seen [facility]rology and will need to be seen by them prior to any planned surgery as he is at risk for needing a cystectomy and urinary diversion at that time. There has also been concern of a possible fistula that would need evaluation prior to any planned procedure.  2) Right renal abscess: Continue drainage and levofloxacin. Repeat CT of abdomen today with IV contrast to reassess abscess and see if it has been adequately drained. If so, can consider removal of drain. He will need 6 weeks of culture specific antibiotics (levofloxacin) and will need repeat renal imaging at that time to ensure complete resolution. This needs to  be resolved prior to any planned surgery for his malignancy to avoid any additional complications at that time.  Repeat imagining can be coordinated with [facility] urology or surgery or Dr. Benay Spice depending on any other planned imaging studies.  Pt with enterococcus and E. Coli from percutaneous drainage procedure and appropriately treated with levofloxacin.  Also noted to have S. Bovis from renal pelvis culture although repeat urine culture on 12/31 was negative.  Would probably continue levofloxacin only pending imaging studies today.   LOS: 7 days   Mio Schellinger,LES 05/20/2014, 7:56 AM

## 2014-05-20 NOTE — Progress Notes (Signed)
Patient with 100% pink skin to coccyx/sacral area. Skin dry and area "tender" per patient. Patient has own donut cushion from home. Applied allevyn dsg over area for protection.

## 2014-05-20 NOTE — Progress Notes (Signed)
7 Days Post-Op  Subjective: Pt without acute changes  Objective: Vital signs in last 24 hours: Temp:  [97.8 F (36.6 C)-98.2 F (36.8 C)] 98.2 F (36.8 C) (01/05 0426) Pulse Rate:  [72-82] 78 (01/05 0426) Resp:  [16-18] 16 (01/05 0426) BP: (121-123)/(72-84) 123/72 mmHg (01/05 0426) SpO2:  [100 %] 100 % (01/05 0426) Last BM Date: 05/19/14  Intake/Output from previous day: 01/04 0701 - 01/05 0700 In: 5519.3 [P.O.:780; I.V.:4739.3] Out: 2105 [Urine:2100; Drains:5] Intake/Output this shift: Total I/O In: -  Out: 500 [Urine:500]  Rt renal drain intact, insertion site ok, mildly tender, output minimal amt bloody fluid; cx's- strept/e coli/enterococcus  Lab Results:   Recent Labs  05/18/14 0610  WBC 3.5*  HGB 7.9*  HCT 25.4*  PLT 226   BMET  Recent Labs  05/18/14 0610  NA 135  K 4.7  CL 105  CO2 26  GLUCOSE 131*  BUN 11  CREATININE 0.92  CALCIUM 8.1*   PT/INR No results for input(s): LABPROT, INR in the last 72 hours. ABG No results for input(s): PHART, HCO3 in the last 72 hours.  Invalid input(s): PCO2, PO2  Studies/Results: Ct Abdomen W Contrast  05/20/2014   CLINICAL DATA:  59 year old male with history of renal abscess status post drainage, now with minimal drain output.  EXAM: CT ABDOMEN WITH CONTRAST  TECHNIQUE: Multidetector CT imaging of the abdomen was performed using the standard protocol following bolus administration of intravenous contrast.  CONTRAST:  198mL OMNIPAQUE IOHEXOL 300 MG/ML  SOLN  COMPARISON:  CT of the abdomen and pelvis 05/12/2014.  FINDINGS: Lower chest: Patchy areas of peribronchovascular micro and macronodularity in the lung bases bilaterally, increased compared to the recent prior study from 05/12/2014, now with a pleural-based area of consolidation in the posterior left lower lobe, most compatible with evolving multilobar bronchopneumonia. Central venous catheter terminating in the right atrium.  Hepatobiliary: Sub cm lesions in the  segments 7 and 8 of the liver are too small to definitively characterize, but are similar to prior studies, favored to represent tiny cysts. No other suspicious appearing hepatic lesions. No intra or extrahepatic biliary ductal dilatation. Gallbladder is unremarkable in appearance.  Pancreas: Unremarkable.  Spleen: The spleen is enlarged measuring 14.3 x 6.6 x 15.1 cm (estimated splenic volume of 713 mL).  Adrenals/Urinary Tract: There is a pigtail drainage catheter noted in the in lateral aspect of the upper pole of the right kidney, at the site of the previously diagnosed renal abscess. The abscess cavity has nearly completely resolved compared to the prior study, with a small 2.0 x 0.8 cm residual cavity noted on image 41 of series 2. There is also a right-sided ureteral stent in position, the proximal loop properly reformed in the upper pole collecting system. No right-sided hydroureteronephrosis in the visualized abdomen. Small amount of right-sided perinephric stranding. 11 mm exophytic intermediate to high attenuation area in the lower pole of the right kidney is slightly smaller than the most recent prior study, but new compared to prior study 12/10/2013, likely a small amount of resolving perinephric hematoma at site of prior percutaneous access for a nephrostomy tube placement. The left kidney is normal in appearance. Bilateral adrenal glands are normal in appearance.  Stomach/Bowel: Normal appearance of the stomach. No pathologic dilatation of small bowel or colon in the visualized portions of the abdomen.  Vascular/Lymphatic: Atherosclerosis in the thoracic aorta, without evidence of aneurysm or dissection. No lymphadenopathy in the visualized portions of the abdomen.  Other: No significant  volume of ascites. No pneumoperitoneum. Small amount a subcutaneous emphysema in the anterior abdominal wall.  Musculoskeletal: There are no aggressive appearing lytic or blastic lesions noted in the visualized  portions of the skeleton.  IMPRESSION: 1. Pigtail drainage catheter in the previously described right renal abscess has nearly completely drained the abscess at this time. There is only a small 2.0 x 0.8 cm fluid collection remaining in the upper pole of the right kidney. 2. The appearance of the lung bases suggests worsening multilobar bronchopneumonia. 3. Additional findings, as above.   Electronically Signed   By: Vinnie Langton M.D.   On: 05/20/2014 10:46    Anti-infectives: Anti-infectives    Start     Dose/Rate Route Frequency Ordered Stop   05/19/14 0900  levofloxacin (LEVAQUIN) IVPB 750 mg     750 mg100 mL/hr over 90 Minutes Intravenous Every 24 hours 05/19/14 0757     05/13/14 1800  cefTRIAXone (ROCEPHIN) 1 g in dextrose 5 % 50 mL IVPB - Premix  Status:  Discontinued     1 g100 mL/hr over 30 Minutes Intravenous Every 24 hours 05/13/14 1650 05/19/14 0757   05/13/14 1800  gentamicin (GARAMYCIN) 240 mg in dextrose 5 % 100 mL IVPB     240 mg106 mL/hr over 60 Minutes Intravenous STAT 05/13/14 1759 05/13/14 1820      Assessment/Plan: S/p right renal abscess drainage 12/31; renal fxn nl;  f/u CT today reveals much improved but not complete drainage of renal abscess; Images were reviewed by Dr. Earleen Newport and he recommends continued drainage for now with f/u in our drain clinic ([phone]) next week if d/c'd home soon; PNA tx per primary.   LOS: 7 days    Jadon Harbaugh,D [facility] 05/20/2014

## 2014-05-20 NOTE — Progress Notes (Signed)
[facility] PROGRESS NOTE  Grant [name] IHK:[id number] DOB: 12/15/1955    PCP:   MATTHEWS,MICHELLE A., MD   Grant [name] is an 59 y.o. male with hx of rectal cancer admitted for pelvic mass obstructing right ureter requiring stent and right nephrostomy tube which drained purulent infection. He also has hx of colovesical fistula with proximal diverting colostomy.  He underwent cystolitholopaxy.  He is getting Ct scan to be sure the abscess had drained adequately.  Dr Wynetta Emery has been consulted and is following him.    Rewiew of Systems:  Constitutional: Negative for malaise, fever and chills. No significant weight loss or weight gain Eyes: Negative for eye pain, redness and discharge, diplopia, visual changes, or flashes of light. ENMT: Negative for ear pain, hoarseness, nasal congestion, sinus pressure and sore throat. No headaches; tinnitus, drooling, or problem swallowing. Cardiovascular: Negative for chest pain, palpitations, diaphoresis, dyspnea and peripheral edema. ; No orthopnea, PND Respiratory: Negative for cough, hemoptysis, wheezing and stridor. No pleuritic chestpain. Gastrointestinal: Negative for nausea, vomiting, diarrhea, constipation, abdominal pain, melena, blood in stool, hematemesis, jaundice and rectal bleeding.    Genitourinary: Negative for frequency, dysuria, incontinence,flank pain and hematuria; Musculoskeletal: Negative for back pain and neck pain. Negative for swelling and trauma.;  Skin: . Negative for pruritus, rash, abrasions, bruising and skin lesion.; ulcerations Neuro: Negative for headache, lightheadedness and neck stiffness. Negative for weakness, altered level of consciousness , altered mental status, extremity weakness, burning feet, involuntary movement, seizure and syncope.  Psych: negative for anxiety, depression, insomnia, tearfulness, panic attacks, hallucinations, paranoia, suicidal or homicidal ideation    Past Medical History   Diagnosis Date  . Fecal incontinence   . 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 Cancer 10/11/13    rectal cancer-adenocarcinoma  . Chronic kidney disease     right stent  . Hx of radiation therapy 12/18/13-01/29/14    RECTAL / 55gy    Past Surgical History  Procedure Laterality Date  . Mandible surgery  1983  . Colonoscopy N/A 10/11/2013    Procedure: COLONOSCOPY;  Surgeon: [name], MD;  Location: [place];  Service: Endoscopy;  Laterality: N/A;  . Colon resection N/A 10/15/2013    Procedure: LAP ASSISTED  LOOP COLOSTOMY;  Surgeon: Harl Bowie, MD;  Location: [place];  Service: General;  Laterality: N/A;  . Nephrostomy tube Right 10/18/13    with stent ,tube removed  . Cystoscopy with ureteroscopy Right 05/13/2014    Procedure: CYSTOSCOPY WITH URETEROSCOPY;  Surgeon: Ardis Hughs, MD;  Location: [facility];  Service: Urology;  Laterality: Right;  CYSTOSCOPY/ LITHOPAXY/RIGHT URETERAL STENT EXCHANGE  . Holmium laser application N/A 75/64/3329    Procedure: HOLMIUM LASER APPLICATION;  Surgeon: Ardis Hughs, MD;  Location: [facility];  Service: Urology;  Laterality: N/A;    Medications:  HOME MEDS: Prior to Admission medications   Medication Sig Start Date End Date Taking? Authorizing Provider  ALPRAZolam Duanne Moron) 0.5 MG tablet Take 1 tablet (0.5 mg total) by mouth 2 (two) times daily as needed for anxiety. 05/13/14  Yes Drue Second, NP  Bisacodyl (DULCOLAX PO) Take 1 tablet by mouth 2 (two) times daily as needed (constipation/laxative.).   Yes Historical Provider, MD  capecitabine (XELODA) 500 MG tablet Takes 2000 mg in am and 1500 mg in pm X 14 days, 7 day rest. Start date 04/03/14. 04/24/14  Yes [name], MD  ferrous sulfate 325 (65 FE) MG tablet Take 1 tablet (  325 mg total) by mouth 2 (two) times daily with a meal. 10/20/13  Yes Nishant Dhungel, MD  lactose free nutrition (BOOST PLUS) LIQD Take 237 mLs by mouth. From four to eight times daily.    Yes Historical Provider, MD  lidocaine-prilocaine (EMLA) cream Apply 1 application topically as needed. Apply to [facility] site 1-2 hours prior to stick and cover with plastic wrap 03/12/14  Yes [name], MD  Multiple Vitamins-Minerals ([employer] SPECTRAVITE ULTRA MENS) TABS Take 1 tablet by mouth every morning.  10/20/13  Yes Historical Provider, MD  omeprazole (PRILOSEC) 20 MG capsule Take 1 capsule (20 mg total) by mouth daily. 04/21/14  Yes Leana Gamer, MD  ondansetron (ZOFRAN) 4 MG tablet Take 1 tablet (4 mg total) by mouth every 8 (eight) hours as needed for nausea or vomiting. 05/13/14  Yes Drue Second, NP  Oxycodone HCl 10 MG TABS Take 0.5-1 tablets (5-10 mg total) by mouth every 4 (four) hours as needed (pain.). Patient taking differently: Take 15 mg by mouth every 4 (four) hours as needed (pain.).  05/13/14  Yes Drue Second, NP  polyethylene glycol (MIRALAX / GLYCOLAX) packet Take 17 g by mouth daily as needed for mild constipation.   Yes Historical Provider, MD  spironolactone (ALDACTONE) 25 MG tablet Take 25 mg by mouth daily as needed (swelling).   Yes Historical Provider, MD  prochlorperazine (COMPAZINE) 10 MG tablet Take 1 tablet (10 mg total) by mouth every 6 (six) hours as needed for nausea. Patient not taking: Reported on 05/13/2014 03/12/14   [name], MD     Allergies:  Allergies  Allergen Reactions  . Strawberry Anaphylaxis and Hives  . Sunflower Oil Anaphylaxis and Hives    seeds  . Watermelon [Citrullus Vulgaris] Anaphylaxis and Hives  . Other     All Melons cantaloupes, etc    Social History:   reports that he quit smoking about 26 years ago. His smokeless tobacco use includes Snuff. He reports that he drinks alcohol. He reports that he uses illicit drugs (Marijuana).  Family History: Family History  Problem Relation Age of Onset  . Cancer Father     pancreatic   . Cancer Maternal Uncle      Physical Exam: Filed Vitals:   05/19/14 2035  05/19/14 2144 05/20/14 0426 05/20/14 1310  BP:  121/84 123/72 125/80  Pulse: 82 72 78 85  Temp:  97.8 F (36.6 C) 98.2 F (36.8 C) 98.6 F (37 C)  TempSrc:  Oral Oral Oral  Resp:  18 16 16   Height:      Weight:      SpO2:  100% 100% 100%   Blood pressure 125/80, pulse 85, temperature 98.6 F (37 C), temperature source Oral, resp. rate 16, height 6\' 6"  (1.981 m), weight 66.407 kg (146 lb 6.4 oz), SpO2 100 %.  GEN:  Pleasant  patient lying in the stretcher in no acute distress; cooperative with exam. PSYCH:  alert and oriented x4; does not appear anxious or depressed; affect is appropriate. HEENT: Mucous membranes pink and anicteric; PERRLA; EOM intact; no cervical lymphadenopathy nor thyromegaly or carotid bruit; no JVD; There were no stridor. Neck is very supple. Breasts:: Not examined CHEST WALL: No tenderness CHEST: Normal respiration, clear to auscultation bilaterally.  HEART: Regular rate and rhythm.  There are no murmur, rub, or gallops.   BACK: No kyphosis or scoliosis; no CVA tenderness ABDOMEN: soft and non-tender; no masses, no organomegaly, normal abdominal bowel sounds; no pannus;  no intertriginous candida. There is no rebound and no distention. He has a  Drain tube on the right flank. Rectal Exam: Not done EXTREMITIES: No bone or joint deformity; age-appropriate arthropathy of the hands and knees; no edema; no ulcerations.  There is no calf tenderness. Genitalia: not examined PULSES: 2+ and symmetric SKIN: Normal hydration no rash or ulceration CNS: Cranial nerves 2-12 grossly intact no focal lateralizing neurologic deficit.  Speech is fluent; uvula elevated with phonation, facial symmetry and tongue midline. DTR are normal bilaterally, cerebella exam is intact, barbinski is negative and strengths are equaled bilaterally.  No sensory loss.   Labs on Admission:  Basic Metabolic Panel:  Recent Labs Lab 05/14/14 0550 05/15/14 0615 05/16/14 0348 05/18/14 0610  NA 128*  129* 133* 135  K 4.0 3.9 4.1 4.7  CL 92* 96 101 105  CO2 27 28 27 26   GLUCOSE 95 127* 134* 131*  BUN 13 13 14 11   CREATININE 1.14 0.95 0.94 0.92  CALCIUM 8.0* 7.9* 8.1* 8.1*     Recent Labs Lab 05/14/14 0550 05/14/14 1907 05/15/14 0615 05/16/14 0348 05/18/14 0610  WBC 6.4  --  4.8 3.9* 3.5*  HGB 6.8* 7.7* 7.6* 7.6* 7.9*  HCT 20.6* 23.5* 22.9* 24.1* 25.4*  MCV 86.2  --  85.8 88.0 88.8  PLT 200  --  181 176 226   Cardiac Enzymes: No results for input(s): CKTOTAL, CKMB, CKMBINDEX, TROPONINI in the last 168 hours.  CBG: No results for input(s): GLUCAP in the last 168 hours.   Radiological Exams on Admission: Ct Abdomen W Contrast  05/20/2014   CLINICAL DATA:  59 year old male with history of renal abscess status post drainage, now with minimal drain output.  EXAM: CT ABDOMEN WITH CONTRAST  TECHNIQUE: Multidetector CT imaging of the abdomen was performed using the standard protocol following bolus administration of intravenous contrast.  CONTRAST:  186mL OMNIPAQUE IOHEXOL 300 MG/ML  SOLN  COMPARISON:  CT of the abdomen and pelvis 05/12/2014.  FINDINGS: Lower chest: Patchy areas of peribronchovascular micro and macronodularity in the lung bases bilaterally, increased compared to the recent prior study from 05/12/2014, now with a pleural-based area of consolidation in the posterior left lower lobe, most compatible with evolving multilobar bronchopneumonia. Central venous catheter terminating in the right atrium.  Hepatobiliary: Sub cm lesions in the segments 7 and 8 of the liver are too small to definitively characterize, but are similar to prior studies, favored to represent tiny cysts. No other suspicious appearing hepatic lesions. No intra or extrahepatic biliary ductal dilatation. Gallbladder is unremarkable in appearance.  Pancreas: Unremarkable.  Spleen: The spleen is enlarged measuring 14.3 x 6.6 x 15.1 cm (estimated splenic volume of 713 mL).  Adrenals/Urinary Tract: There is a  pigtail drainage catheter noted in the in lateral aspect of the upper pole of the right kidney, at the site of the previously diagnosed renal abscess. The abscess cavity has nearly completely resolved compared to the prior study, with a small 2.0 x 0.8 cm residual cavity noted on image 41 of series 2. There is also a right-sided ureteral stent in position, the proximal loop properly reformed in the upper pole collecting system. No right-sided hydroureteronephrosis in the visualized abdomen. Small amount of right-sided perinephric stranding. 11 mm exophytic intermediate to high attenuation area in the lower pole of the right kidney is slightly smaller than the most recent prior study, but new compared to prior study 12/10/2013, likely a small amount of resolving perinephric hematoma at site of  prior percutaneous access for a nephrostomy tube placement. The left kidney is normal in appearance. Bilateral adrenal glands are normal in appearance.  Stomach/Bowel: Normal appearance of the stomach. No pathologic dilatation of small bowel or colon in the visualized portions of the abdomen.  Vascular/Lymphatic: Atherosclerosis in the thoracic aorta, without evidence of aneurysm or dissection. No lymphadenopathy in the visualized portions of the abdomen.  Other: No significant volume of ascites. No pneumoperitoneum. Small amount a subcutaneous emphysema in the anterior abdominal wall.  Musculoskeletal: There are no aggressive appearing lytic or blastic lesions noted in the visualized portions of the skeleton.  IMPRESSION: 1. Pigtail drainage catheter in the previously described right renal abscess has nearly completely drained the abscess at this time. There is only a small 2.0 x 0.8 cm fluid collection remaining in the upper pole of the right kidney. 2. The appearance of the lung bases suggests worsening multilobar bronchopneumonia. 3. Additional findings, as above.   Electronically Signed   By: Vinnie Langton M.D.   On:  05/20/2014 10:46    Assessment/Plan Present on Admission:  . Anemia of chronic disease . Severe protein-calorie malnutrition . CA of rectum   PLAN:  It appears that the abscess is nearly gone.  Will speak with Dr Alinda Money about d/c the tube.  His lung base via CT showed worsening PNA, but he is not clinically getting worse.  He will need urology consult prior to his surgery as per Dr Alinda Money.  WIll continue with current antibiotic at this time.   Other plans as per orders.  Code Status: FULL Haskel Khan, MD. [facility] Pager [phone] 7pm to 7am.  05/20/2014, 5:29 PM

## 2014-05-20 NOTE — Care Management Note (Signed)
CARE MANAGEMENT NOTE 05/20/2014  Patient:  Grant [name], Grant [name]   Account Number:  [id number]  Date Initiated:  05/20/2014  Documentation initiated by:  Marney Doctor  Subjective/Objective Assessment:   58 yo admitted with Ureteral Obstruction     Action/Plan:   From home with spouse   Anticipated DC Date:  05/22/2014   Anticipated DC Plan:  [place]  CM consult      [facility] Choice  HOME HEALTH   Choice offered to / List presented to:  C-1 Patient        [place] arranged  HH-1 RN      [place].   Status of service:  In process, will continue to follow Medicare Important Message given?   (If response is "NO", the following Medicare IM given date fields will be blank) Date Medicare IM given:   Medicare IM given by:   Date Additional Medicare IM given:   Additional Medicare IM given by:    Discharge Disposition:    Per UR Regulation:  Reviewed for med. necessity/level of care/duration of stay  If discussed at [place] of Stay Meetings, dates discussed:    Comments:  05/20/14 Marney Doctor RN,BSN,NCM [phone] PT is recommending HHPT.  Pt has Medicaid and is only able to receive [facility] for safety eval.  Pt offered choice and would like to use [facility] as he has used them before. [facility] rep given referral.  Pt has a tall RW that was used by his father and fits him well. CM will continue to follow for needs

## 2014-05-21 DIAGNOSIS — N151 Renal and perinephric abscess: Principal | ICD-10-CM

## 2014-05-21 DIAGNOSIS — J189 Pneumonia, unspecified organism: Secondary | ICD-10-CM

## 2014-05-21 DIAGNOSIS — C20 Malignant neoplasm of rectum: Secondary | ICD-10-CM

## 2014-05-21 DIAGNOSIS — N1330 Unspecified hydronephrosis: Secondary | ICD-10-CM

## 2014-05-21 NOTE — Progress Notes (Signed)
8 Days Post-Op  Subjective: Pt doing well; no new c/o  Objective: Vital signs in last 24 hours: Temp:  [98.1 F (36.7 C)-98.7 F (37.1 C)] 98.7 F (37.1 C) (01/06 1401) Pulse Rate:  [80-87] 87 (01/06 1401) Resp:  [16] 16 (01/06 1401) BP: (120-135)/(65-81) 135/74 mmHg (01/06 1401) SpO2:  [100 %] 100 % (01/06 1401) Weight:  [144 lb 6.4 oz (65.499 kg)] 144 lb 6.4 oz (65.499 kg) (01/06 0442) Last BM Date: 05/21/14  Intake/Output from previous day: 01/05 0701 - 01/06 0700 In: 1270 [P.O.:1260; I.V.:10] Out: 5002 [Urine:5002] Intake/Output this shift: Total I/O In: 1140 [P.O.:1140] Out: 1625 [Urine:1625]  Rt renal drain intact, insertion site ok, minimal output Lab Results:  No results for input(s): WBC, HGB, HCT, PLT in the last 72 hours. BMET No results for input(s): NA, K, CL, CO2, GLUCOSE, BUN, CREATININE, CALCIUM in the last 72 hours. PT/INR No results for input(s): LABPROT, INR in the last 72 hours. ABG No results for input(s): PHART, HCO3 in the last 72 hours.  Invalid input(s): PCO2, PO2  Studies/Results: Ct Abdomen W Contrast  05/20/2014   CLINICAL DATA:  59 year old male with history of renal abscess status post drainage, now with minimal drain output.  EXAM: CT ABDOMEN WITH CONTRAST  TECHNIQUE: Multidetector CT imaging of the abdomen was performed using the standard protocol following bolus administration of intravenous contrast.  CONTRAST:  153mL OMNIPAQUE IOHEXOL 300 MG/ML  SOLN  COMPARISON:  CT of the abdomen and pelvis 05/12/2014.  FINDINGS: Lower chest: Patchy areas of peribronchovascular micro and macronodularity in the lung bases bilaterally, increased compared to the recent prior study from 05/12/2014, now with a pleural-based area of consolidation in the posterior left lower lobe, most compatible with evolving multilobar bronchopneumonia. Central venous catheter terminating in the right atrium.  Hepatobiliary: Sub cm lesions in the segments 7 and 8 of the liver are  too small to definitively characterize, but are similar to prior studies, favored to represent tiny cysts. No other suspicious appearing hepatic lesions. No intra or extrahepatic biliary ductal dilatation. Gallbladder is unremarkable in appearance.  Pancreas: Unremarkable.  Spleen: The spleen is enlarged measuring 14.3 x 6.6 x 15.1 cm (estimated splenic volume of 713 mL).  Adrenals/Urinary Tract: There is a pigtail drainage catheter noted in the in lateral aspect of the upper pole of the right kidney, at the site of the previously diagnosed renal abscess. The abscess cavity has nearly completely resolved compared to the prior study, with a small 2.0 x 0.8 cm residual cavity noted on image 41 of series 2. There is also a right-sided ureteral stent in position, the proximal loop properly reformed in the upper pole collecting system. No right-sided hydroureteronephrosis in the visualized abdomen. Small amount of right-sided perinephric stranding. 11 mm exophytic intermediate to high attenuation area in the lower pole of the right kidney is slightly smaller than the most recent prior study, but new compared to prior study 12/10/2013, likely a small amount of resolving perinephric hematoma at site of prior percutaneous access for a nephrostomy tube placement. The left kidney is normal in appearance. Bilateral adrenal glands are normal in appearance.  Stomach/Bowel: Normal appearance of the stomach. No pathologic dilatation of small bowel or colon in the visualized portions of the abdomen.  Vascular/Lymphatic: Atherosclerosis in the thoracic aorta, without evidence of aneurysm or dissection. No lymphadenopathy in the visualized portions of the abdomen.  Other: No significant volume of ascites. No pneumoperitoneum. Small amount a subcutaneous emphysema in the anterior abdominal  wall.  Musculoskeletal: There are no aggressive appearing lytic or blastic lesions noted in the visualized portions of the skeleton.  IMPRESSION:  1. Pigtail drainage catheter in the previously described right renal abscess has nearly completely drained the abscess at this time. There is only a small 2.0 x 0.8 cm fluid collection remaining in the upper pole of the right kidney. 2. The appearance of the lung bases suggests worsening multilobar bronchopneumonia. 3. Additional findings, as above.   Electronically Signed   By: Vinnie Langton M.D.   On: 05/20/2014 10:46    Anti-infectives: Anti-infectives    Start     Dose/Rate Route Frequency Ordered Stop   05/19/14 0900  levofloxacin (LEVAQUIN) IVPB 750 mg     750 mg100 mL/hr over 90 Minutes Intravenous Every 24 hours 05/19/14 0757     05/13/14 1800  cefTRIAXone (ROCEPHIN) 1 g in dextrose 5 % 50 mL IVPB - Premix  Status:  Discontinued     1 g100 mL/hr over 30 Minutes Intravenous Every 24 hours 05/13/14 1650 05/19/14 0757   05/13/14 1800  gentamicin (GARAMYCIN) 240 mg in dextrose 5 % 100 mL IVPB     240 mg106 mL/hr over 60 Minutes Intravenous STAT 05/13/14 1759 05/13/14 1820      Assessment/Plan: S/p right renal abscess drainage 12/31; output from drain remains minimal; unable to aspirate or flush drain with NS (probably clotted- did not attempt forceful flush due to potential bleeding risk)); case d/w Drs. Yamagata/Borden; rt renal drain removed today in its entirety without immediate complications. Gauze dressing applied to site.    LOS: 8 days    ALLRED,D [facility] 05/21/2014

## 2014-05-21 NOTE — Progress Notes (Signed)
Patient ID: Grant [name], male   DOB: [birth year], 59 y.o.   MRN: [id number]  8 Days Post-Op Subjective: Pt continuing to feel better.  He is able to ambulate now.  Flank pain is minimal.  Drain output is minimal.  Objective: Vital signs in last 24 hours: Temp:  [98.1 F (36.7 C)-98.6 F (37 C)] 98.1 F (36.7 C) (01/06 0442) Pulse Rate:  [80-85] 84 (01/06 0442) Resp:  [16] 16 (01/06 0442) BP: (120-126)/(65-81) 120/65 mmHg (01/06 0442) SpO2:  [100 %] 100 % (01/06 0442) Weight:  [65.499 kg (144 lb 6.4 oz)] 65.499 kg (144 lb 6.4 oz) (01/06 0442)  Intake/Output from previous day: 01/05 0701 - 01/06 0700 In: 1260 [P.O.:1260] Out: 4402 [Urine:4402] Intake/Output this shift:    Physical Exam:  General: Alert and oriented Abdomen: Soft, ND, NT, No CVAT Drain: Minimal bloody output (5 cc per day for last few days Ext: NT, No erythema   Studies/Results: Ct Abdomen W Contrast  05/20/2014   CLINICAL DATA:  59 year old male with history of renal abscess status post drainage, now with minimal drain output.  EXAM: CT ABDOMEN WITH CONTRAST  TECHNIQUE: Multidetector CT imaging of the abdomen was performed using the standard protocol following bolus administration of intravenous contrast.  CONTRAST:  144mL OMNIPAQUE IOHEXOL 300 MG/ML  SOLN  COMPARISON:  CT of the abdomen and pelvis 05/12/2014.  FINDINGS: Lower chest: Patchy areas of peribronchovascular micro and macronodularity in the lung bases bilaterally, increased compared to the recent prior study from 05/12/2014, now with a pleural-based area of consolidation in the posterior left lower lobe, most compatible with evolving multilobar bronchopneumonia. Central venous catheter terminating in the right atrium.  Hepatobiliary: Sub cm lesions in the segments 7 and 8 of the liver are too small to definitively characterize, but are similar to prior studies, favored to represent tiny cysts. No other suspicious appearing hepatic lesions. No intra or  extrahepatic biliary ductal dilatation. Gallbladder is unremarkable in appearance.  Pancreas: Unremarkable.  Spleen: The spleen is enlarged measuring 14.3 x 6.6 x 15.1 cm (estimated splenic volume of 713 mL).  Adrenals/Urinary Tract: There is a pigtail drainage catheter noted in the in lateral aspect of the upper pole of the right kidney, at the site of the previously diagnosed renal abscess. The abscess cavity has nearly completely resolved compared to the prior study, with a small 2.0 x 0.8 cm residual cavity noted on image 41 of series 2. There is also a right-sided ureteral stent in position, the proximal loop properly reformed in the upper pole collecting system. No right-sided hydroureteronephrosis in the visualized abdomen. Small amount of right-sided perinephric stranding. 11 mm exophytic intermediate to high attenuation area in the lower pole of the right kidney is slightly smaller than the most recent prior study, but new compared to prior study 12/10/2013, likely a small amount of resolving perinephric hematoma at site of prior percutaneous access for a nephrostomy tube placement. The left kidney is normal in appearance. Bilateral adrenal glands are normal in appearance.  Stomach/Bowel: Normal appearance of the stomach. No pathologic dilatation of small bowel or colon in the visualized portions of the abdomen.  Vascular/Lymphatic: Atherosclerosis in the thoracic aorta, without evidence of aneurysm or dissection. No lymphadenopathy in the visualized portions of the abdomen.  Other: No significant volume of ascites. No pneumoperitoneum. Small amount a subcutaneous emphysema in the anterior abdominal wall.  Musculoskeletal: There are no aggressive appearing lytic or blastic lesions noted in the visualized portions of the skeleton.  IMPRESSION:  1. Pigtail drainage catheter in the previously described right renal abscess has nearly completely drained the abscess at this time. There is only a small 2.0 x 0.8 cm  fluid collection remaining in the upper pole of the right kidney. 2. The appearance of the lung bases suggests worsening multilobar bronchopneumonia. 3. Additional findings, as above.   Electronically Signed   By: Vinnie Langton M.D.   On: 05/20/2014 10:46    Assessment/Plan: 1) Renal abscess: Abscess is significantly reduced on CT scan yesterday. Drain output is currently minimal.  If drain output remains minimal over next 24 hours, I would remove drain at that time as it does not appear to be providing any benefit with scant drainage. He will need to continue levofloxacin for 6 weeks and will then need repeat abdominal imaging for reassessment.  I spoke with Dr. Benay Spice this morning and will coordinate repeat imaging based on his oncologic followup imaging in the future.  2) Right ureteral obstruction/possible fistula: Patient is planning to have surgical removal of his rectal cancer mass in the near future possibly following additional neoadjuvant chemotherapy. Will need to have Grant [name] see [facility] in anticipation of that surgery for evaluation as any surgery will likely require cystectomy and need for urinary diversion.   LOS: 8 days   Grant [name],LES 05/21/2014, 7:10 AM

## 2014-05-21 NOTE — Progress Notes (Signed)
Physical Therapy Treatment Patient Details Name: Anterio Scheel MRN: [id number] DOB: April 12, 1956 Today's Date: 05/21/2014    History of Present Illness Kabeer Hoagland is an 59 y.o. 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  He had a staging CT scan done 05/13/2014 which showed an obstructed right kidney due to stone and possible right renal abscess.  He subsequently underwent cystolithalopaxy and right ureteral stent exchange with aspiration of 50cc of purulent fluid from right kidney.    PT Comments    Assisted pt OOB to amb full unit.  Slow but steady gait.  Does tend to look down to minimize dizziness which pt states is "from the pain meds".  Pt progressing well.  Follow Up Recommendations  Home health PT     Equipment Recommendations       Recommendations for Other Services       Precautions / Restrictions Precautions Precautions: Fall Precaution Comments: R drain Restrictions Weight Bearing Restrictions: No    Mobility  Bed Mobility Overal bed mobility: Modified Independent                Transfers Overall transfer level: Needs assistance Equipment used: None Transfers: Sit to/from Stand Sit to Stand: Supervision;Min guard         General transfer comment: good use of hands and good safety cognition  Ambulation/Gait Ambulation/Gait assistance: Min guard Ambulation  Distance (Feet): 275 Feet (decreased amb distance today due to increased c/o fatihue after the fact) Assistive device: Rolling walker (2 wheeled);Straight cane Gait Pattern/deviations: Step-through pattern;Decreased stride length;Trunk flexed Gait velocity: decreased   General Gait Details: slow gait.  Tends to look down to minimize dizziness (pain meds).     Stairs            Wheelchair Mobility    Modified Rankin (Stroke Patients Only)       Balance                                    Cognition Arousal/Alertness: Awake/alert Behavior During Therapy: WFL for tasks assessed/performed Overall Cognitive Status: Within Functional Limits for tasks assessed                      Exercises      General Comments        Pertinent Vitals/Pain Pain Assessment: No/denies pain    Home Living                      Prior Function            PT Goals (current goals can now be found in the care plan section) Progress towards PT goals: Progressing toward goals    Frequency  Min 3X/week    PT Plan      Co-evaluation             End of Session Equipment Utilized During Treatment: Gait belt Activity Tolerance:  Patient tolerated treatment well Patient left: in bed;with call bell/phone within reach     Time: 1411-1438 PT Time Calculation (min) (ACUTE ONLY): 27 min  Charges:  $Gait Training: 23-37 mins                    G Codes:      Rica Koyanagi  PTA [facility]  Acute  Rehab Pager      [phone]

## 2014-05-21 NOTE — Progress Notes (Signed)
[facility] PROGRESS NOTE  Grant [name] UYQ:[id number] DOB: 1956-02-08    PCP:   MATTHEWS,MICHELLE A., MD   HPI: Grant [name] is an 59 y.o. male with hx of rectal cancer admitted for pelvic mass obstructing right ureter requiring stent and right nephrostomy tube which drained purulent infection. He also has hx of colovesical fistula with proximal diverting colostomy. He underwent cystolitholopaxy. He is getting Ct scan to be sure the abscess had drained adequately. Dr Wynetta Emery has been consulted and is following him. Drain was clotted, so IR pulled it uneventfully.     Rewiew of Systems:  Constitutional: Negative for malaise, fever and chills. No significant weight loss or weight gain Eyes: Negative for eye pain, redness and discharge, diplopia, visual changes, or flashes of light. ENMT: Negative for ear pain, hoarseness, nasal congestion, sinus pressure and sore throat. No headaches; tinnitus, drooling, or problem swallowing. Cardiovascular: Negative for chest pain, palpitations, diaphoresis, dyspnea and peripheral edema. ; No orthopnea, PND Respiratory: Negative for cough, hemoptysis, wheezing and stridor. No pleuritic chestpain. Gastrointestinal: Negative for nausea, vomiting, diarrhea, constipation, abdominal pain, melena, blood in stool, hematemesis, jaundice and rectal bleeding.    Genitourinary: Negative for frequency, dysuria, incontinence,flank pain and hematuria; Musculoskeletal: Negative for back pain and neck pain. Negative for swelling and trauma.;  Skin: . Negative for pruritus, rash, abrasions, bruising and skin lesion.; ulcerations Neuro: Negative for headache, lightheadedness and neck stiffness. Negative for weakness, altered level of consciousness , altered mental status, extremity weakness, burning feet, involuntary movement, seizure and syncope.  Psych: negative for anxiety, depression, insomnia, tearfulness, panic attacks, hallucinations, paranoia, suicidal  or homicidal ideation    Past Medical History  Diagnosis Date  . Fecal incontinence   . 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 Cancer 10/11/13    rectal cancer-adenocarcinoma  . Chronic kidney disease     right stent  . Hx of radiation therapy 12/18/13-01/29/14    RECTAL / 55gy    Past Surgical History  Procedure Laterality Date  . Mandible surgery  1983  . Colonoscopy N/A 10/11/2013    Procedure: COLONOSCOPY;  Surgeon: [name], MD;  Location: [place];  Service: Endoscopy;  Laterality: N/A;  . Colon resection N/A 10/15/2013    Procedure: LAP ASSISTED  LOOP COLOSTOMY;  Surgeon: Harl Bowie, MD;  Location: [place];  Service: General;  Laterality: N/A;  . Nephrostomy tube Right 10/18/13    with stent ,tube removed  . Cystoscopy with ureteroscopy Right 05/13/2014    Procedure: CYSTOSCOPY WITH URETEROSCOPY;  Surgeon: Ardis Hughs, MD;  Location: [facility];  Service: Urology;  Laterality: Right;  CYSTOSCOPY/ LITHOPAXY/RIGHT URETERAL STENT EXCHANGE  . Holmium laser application N/A 63/87/5643    Procedure: HOLMIUM LASER APPLICATION;  Surgeon: Ardis Hughs, MD;  Location: [facility];  Service: Urology;  Laterality: N/A;    Medications:  HOME MEDS: Prior to Admission medications   Medication Sig Start Date End Date Taking? Authorizing Provider  ALPRAZolam Duanne Moron) 0.5 MG tablet Take 1 tablet (0.5 mg total) by mouth 2 (two) times daily as needed for anxiety. 05/13/14  Yes Drue Second, NP  Bisacodyl (DULCOLAX PO) Take 1 tablet by mouth 2 (two) times daily as needed (constipation/laxative.).   Yes Historical Provider, MD  capecitabine (XELODA) 500 MG tablet Takes 2000 mg in am and 1500 mg in pm X 14 days, 7 day rest. Start date 04/03/14. 04/24/14  Yes [name], MD  ferrous sulfate 325 (  65 FE) MG tablet Take 1 tablet (325 mg total) by mouth 2 (two) times daily with a meal. 10/20/13  Yes Nishant Dhungel, MD  lactose free nutrition (BOOST PLUS) LIQD Take 237  mLs by mouth. From four to eight times daily.   Yes Historical Provider, MD  lidocaine-prilocaine (EMLA) cream Apply 1 application topically as needed. Apply to [facility] site 1-2 hours prior to stick and cover with plastic wrap 03/12/14  Yes [name], MD  Multiple Vitamins-Minerals ([employer] SPECTRAVITE ULTRA MENS) TABS Take 1 tablet by mouth every morning.  10/20/13  Yes Historical Provider, MD  omeprazole (PRILOSEC) 20 MG capsule Take 1 capsule (20 mg total) by mouth daily. 04/21/14  Yes Leana Gamer, MD  ondansetron (ZOFRAN) 4 MG tablet Take 1 tablet (4 mg total) by mouth every 8 (eight) hours as needed for nausea or vomiting. 05/13/14  Yes Drue Second, NP  Oxycodone HCl 10 MG TABS Take 0.5-1 tablets (5-10 mg total) by mouth every 4 (four) hours as needed (pain.). Patient taking differently: Take 15 mg by mouth every 4 (four) hours as needed (pain.).  05/13/14  Yes Drue Second, NP  polyethylene glycol (MIRALAX / GLYCOLAX) packet Take 17 g by mouth daily as needed for mild constipation.   Yes Historical Provider, MD  spironolactone (ALDACTONE) 25 MG tablet Take 25 mg by mouth daily as needed (swelling).   Yes Historical Provider, MD  prochlorperazine (COMPAZINE) 10 MG tablet Take 1 tablet (10 mg total) by mouth every 6 (six) hours as needed for nausea. Patient not taking: Reported on 05/13/2014 03/12/14   [name], MD     Allergies:  Allergies  Allergen Reactions  . Strawberry Anaphylaxis and Hives  . Sunflower Oil Anaphylaxis and Hives    seeds  . Watermelon [Citrullus Vulgaris] Anaphylaxis and Hives  . Other     All Melons cantaloupes, etc    Social History:   reports that he quit smoking about 26 years ago. His smokeless tobacco use includes Snuff. He reports that he drinks alcohol. He reports that he uses illicit drugs (Marijuana).  Family History: Family History  Problem Relation Age of Onset  . Cancer Father     pancreatic   . Cancer Maternal Uncle       Physical Exam: Filed Vitals:   05/20/14 1310 05/20/14 2133 05/21/14 0442 05/21/14 1401  BP: 125/80 126/81 120/65 135/74  Pulse: 85 80 84 87  Temp: 98.6 F (37 C) 98.1 F (36.7 C) 98.1 F (36.7 C) 98.7 F (37.1 C)  TempSrc: Oral Oral Oral Oral  Resp: 16 16 16 16   Height:      Weight:   65.499 kg (144 lb 6.4 oz)   SpO2: 100% 100% 100% 100%   Blood pressure 135/74, pulse 87, temperature 98.7 F (37.1 C), temperature source Oral, resp. rate 16, height 6\' 6"  (1.981 m), weight 65.499 kg (144 lb 6.4 oz), SpO2 100 %.  GEN:  Pleasant patient lying in the stretcher in no acute distress; cooperative with exam. PSYCH:  alert and oriented x4; does not appear anxious or depressed; affect is appropriate. HEENT: Mucous membranes pink and anicteric; PERRLA; EOM intact; no cervical lymphadenopathy nor thyromegaly or carotid bruit; no JVD; There were no stridor. Neck is very supple. Breasts:: Not examined CHEST WALL: No tenderness CHEST: Normal respiration, clear to auscultation bilaterally.  HEART: Regular rate and rhythm.  There are no murmur, rub, or gallops.   BACK: No kyphosis or scoliosis; no CVA tenderness  ABDOMEN: soft and non-tender; no masses, no organomegaly, normal abdominal bowel sounds; no pannus; no intertriginous candida. There is no rebound and no distention. Rectal Exam: Not done EXTREMITIES: No bone or joint deformity; age-appropriate arthropathy of the hands and knees; no edema; no ulcerations.  There is no calf tenderness. Genitalia: not examined PULSES: 2+ and symmetric SKIN: Normal hydration no rash or ulceration CNS: Cranial nerves 2-12 grossly intact no focal lateralizing neurologic deficit.  Speech is fluent; uvula elevated with phonation, facial symmetry and tongue midline. DTR are normal bilaterally, cerebella exam is intact, barbinski is negative and strengths are equaled bilaterally.  No sensory loss.   Labs on Admission:  Basic Metabolic Panel:  Recent  Labs Lab 05/15/14 0615 05/16/14 0348 05/18/14 0610  NA 129* 133* 135  K 3.9 4.1 4.7  CL 96 101 105  CO2 28 27 26   GLUCOSE 127* 134* 131*  BUN 13 14 11   CREATININE 0.95 0.94 0.92  CALCIUM 7.9* 8.1* 8.1*   Liver Function Tests: No results for input(s): AST, ALT, ALKPHOS, BILITOT, PROT, ALBUMIN in the last 168 hours. No results for input(s): LIPASE, AMYLASE in the last 168 hours. No results for input(s): AMMONIA in the last 168 hours. CBC:  Recent Labs Lab 05/14/14 1907 05/15/14 0615 05/16/14 0348 05/18/14 0610  WBC  --  4.8 3.9* 3.5*  HGB 7.7* 7.6* 7.6* 7.9*  HCT 23.5* 22.9* 24.1* 25.4*  MCV  --  85.8 88.0 88.8  PLT  --  181 176 226   Cardiac Enzymes: No results for input(s): CKTOTAL, CKMB, CKMBINDEX, TROPONINI in the last 168 hours.  CBG: No results for input(s): GLUCAP in the last 168 hours.   Radiological Exams on Admission: Ct Abdomen W Contrast  05/20/2014   CLINICAL DATA:  59 year old male with history of renal abscess status post drainage, now with minimal drain output.  EXAM: CT ABDOMEN WITH CONTRAST  TECHNIQUE: Multidetector CT imaging of the abdomen was performed using the standard protocol following bolus administration of intravenous contrast.  CONTRAST:  125mL OMNIPAQUE IOHEXOL 300 MG/ML  SOLN  COMPARISON:  CT of the abdomen and pelvis 05/12/2014.  FINDINGS: Lower chest: Patchy areas of peribronchovascular micro and macronodularity in the lung bases bilaterally, increased compared to the recent prior study from 05/12/2014, now with a pleural-based area of consolidation in the posterior left lower lobe, most compatible with evolving multilobar bronchopneumonia. Central venous catheter terminating in the right atrium.  Hepatobiliary: Sub cm lesions in the segments 7 and 8 of the liver are too small to definitively characterize, but are similar to prior studies, favored to represent tiny cysts. No other suspicious appearing hepatic lesions. No intra or extrahepatic  biliary ductal dilatation. Gallbladder is unremarkable in appearance.  Pancreas: Unremarkable.  Spleen: The spleen is enlarged measuring 14.3 x 6.6 x 15.1 cm (estimated splenic volume of 713 mL).  Adrenals/Urinary Tract: There is a pigtail drainage catheter noted in the in lateral aspect of the upper pole of the right kidney, at the site of the previously diagnosed renal abscess. The abscess cavity has nearly completely resolved compared to the prior study, with a small 2.0 x 0.8 cm residual cavity noted on image 41 of series 2. There is also a right-sided ureteral stent in position, the proximal loop properly reformed in the upper pole collecting system. No right-sided hydroureteronephrosis in the visualized abdomen. Small amount of right-sided perinephric stranding. 11 mm exophytic intermediate to high attenuation area in the lower pole of the right kidney is  slightly smaller than the most recent prior study, but new compared to prior study 12/10/2013, likely a small amount of resolving perinephric hematoma at site of prior percutaneous access for a nephrostomy tube placement. The left kidney is normal in appearance. Bilateral adrenal glands are normal in appearance.  Stomach/Bowel: Normal appearance of the stomach. No pathologic dilatation of small bowel or colon in the visualized portions of the abdomen.  Vascular/Lymphatic: Atherosclerosis in the thoracic aorta, without evidence of aneurysm or dissection. No lymphadenopathy in the visualized portions of the abdomen.  Other: No significant volume of ascites. No pneumoperitoneum. Small amount a subcutaneous emphysema in the anterior abdominal wall.  Musculoskeletal: There are no aggressive appearing lytic or blastic lesions noted in the visualized portions of the skeleton.  IMPRESSION: 1. Pigtail drainage catheter in the previously described right renal abscess has nearly completely drained the abscess at this time. There is only a small 2.0 x 0.8 cm fluid  collection remaining in the upper pole of the right kidney. 2. The appearance of the lung bases suggests worsening multilobar bronchopneumonia. 3. Additional findings, as above.   Electronically Signed   By: Vinnie Langton M.D.   On: 05/20/2014 10:46   Assessment/Plan Present on Admission:  . Anemia of chronic disease . Severe protein-calorie malnutrition . CA of rectum  PLAN:  PLAN: It appears that the abscess is nearly gone. Drainage tube is pulled.  His lung base via CT showed worsening PNA, but he is not clinically getting worse. He will need [facility] urology consult prior to his surgery as per Dr Alinda Money. WIll continue with current antibiotic at this time.  Follow up CT will be made timely with Dr Carin Hock oncological follow up.  Other plans as per orders.  Code Status: FULL Haskel Khan, MD. [facility] Pager [phone] 7pm to 7am.  05/21/2014, 5:22 PM

## 2014-05-21 NOTE — Progress Notes (Signed)
IP PROGRESS NOTE  Subjective:   He was admitted 05/13/2014 for management of right hydronephrosis and a right renal abscess. He reports feeling much better. He is now ambulating. The right flank pain has improved. He continues to have pelvic pain.  Objective: Vital signs in last 24 hours: Blood pressure 135/74, pulse 87, temperature 98.7 F (37.1 C), temperature source Oral, resp. rate 16, height 6\' 6"  (1.981 m), weight 144 lb 6.4 oz (65.499 kg), SpO2 100 %.  Intake/Output from previous day: 01/05 0701 - 01/06 0700 In: 1270 [P.O.:1260; I.V.:10] Out: 5002 [Urine:5002]  Physical Exam:  HEENT: No thrush Lungs: Distant breath sounds, decreased breath sounds at the bases, no respiratory distress Cardiac: Regular rate and rhythm Abdomen: No hepatomegaly, left lower quadrant colostomy, tender in the suprapubic area Extremities: No leg edema   Portacath/PICC-without erythema   Studies/Results: Ct Abdomen W Contrast  05/20/2014   CLINICAL DATA:  59 year old male with history of renal abscess status post drainage, now with minimal drain output.  EXAM: CT ABDOMEN WITH CONTRAST  TECHNIQUE: Multidetector CT imaging of the abdomen was performed using the standard protocol following bolus administration of intravenous contrast.  CONTRAST:  191mL OMNIPAQUE IOHEXOL 300 MG/ML  SOLN  COMPARISON:  CT of the abdomen and pelvis 05/12/2014.  FINDINGS: Lower chest: Patchy areas of peribronchovascular micro and macronodularity in the lung bases bilaterally, increased compared to the recent prior study from 05/12/2014, now with a pleural-based area of consolidation in the posterior left lower lobe, most compatible with evolving multilobar bronchopneumonia. Central venous catheter terminating in the right atrium.  Hepatobiliary: Sub cm lesions in the segments 7 and 8 of the liver are too small to definitively characterize, but are similar to prior studies, favored to represent tiny cysts. No other suspicious  appearing hepatic lesions. No intra or extrahepatic biliary ductal dilatation. Gallbladder is unremarkable in appearance.  Pancreas: Unremarkable.  Spleen: The spleen is enlarged measuring 14.3 x 6.6 x 15.1 cm (estimated splenic volume of 713 mL).  Adrenals/Urinary Tract: There is a pigtail drainage catheter noted in the in lateral aspect of the upper pole of the right kidney, at the site of the previously diagnosed renal abscess. The abscess cavity has nearly completely resolved compared to the prior study, with a small 2.0 x 0.8 cm residual cavity noted on image 41 of series 2. There is also a right-sided ureteral stent in position, the proximal loop properly reformed in the upper pole collecting system. No right-sided hydroureteronephrosis in the visualized abdomen. Small amount of right-sided perinephric stranding. 11 mm exophytic intermediate to high attenuation area in the lower pole of the right kidney is slightly smaller than the most recent prior study, but new compared to prior study 12/10/2013, likely a small amount of resolving perinephric hematoma at site of prior percutaneous access for a nephrostomy tube placement. The left kidney is normal in appearance. Bilateral adrenal glands are normal in appearance.  Stomach/Bowel: Normal appearance of the stomach. No pathologic dilatation of small bowel or colon in the visualized portions of the abdomen.  Vascular/Lymphatic: Atherosclerosis in the thoracic aorta, without evidence of aneurysm or dissection. No lymphadenopathy in the visualized portions of the abdomen.  Other: No significant volume of ascites. No pneumoperitoneum. Small amount a subcutaneous emphysema in the anterior abdominal wall.  Musculoskeletal: There are no aggressive appearing lytic or blastic lesions noted in the visualized portions of the skeleton.  IMPRESSION: 1. Pigtail drainage catheter in the previously described right renal abscess has nearly completely drained the  abscess at this  time. There is only a small 2.0 x 0.8 cm fluid collection remaining in the upper pole of the right kidney. 2. The appearance of the lung bases suggests worsening multilobar bronchopneumonia. 3. Additional findings, as above.   Electronically Signed   By: Vinnie Langton M.D.   On: 05/20/2014 10:46    Medications: I have reviewed the patient's current medications.  Assessment/Plan:  1. Rectal cancer-large pelvic mass with a colonoscopy 10/11/2013 confirming an obstructing tumor at 9 cm from the anal verge  CTs of the chest, abdomen, and revealed a large complex pelvic mass, gas in the bladder, loculated gas posterior to the bladder, right hydronephrosis, and small bilateral indeterminant lung nodules.   Initiation of radiation and Xeloda 12/18/2013; completed 01/29/2014.  Restaging CT evaluation at [facility] showed decrease in size of the rectosigmoid mass from 14.6 x 10.5 cm to 9.5 x 8.6 cm. Post radiation changes. Moderate right hydronephrosis with a double-J ureteral stent in place and mild left hydronephrosis, unchanged. Pulmonary nodules unchanged. Circumferential bladder wall thickening likely secondary to radiation. Rectosigmoid mass abuts the posterior bladder and involvement cannot be excluded.  Cycle 1 Capox beginning 03/13/2014  Cycle 2 CAPOX beginning 04/03/2014.  Cycle 3 CAPOX beginning 04/24/2014.  CT 05/13/2014 confirming a decrease in the rectal mass 2. Right hydronephrosis secondary to the obstructing pelvic mass, status post placement of a right percutaneous nephrostomy tube followed by a right ureter stent.  Persistent hydronephrosis with a new right kidney cystic lesion on a CT 05/13/2014  Right renal parenchymal abscess and bladder stone confirmed on a cystoscopy 05/13/2014, status post a cystolithalopaxy, right ureter stent exchange  Placement of a percutaneous right renal abscess drain on 05/15/2014 3. Microcytic anemia-likely iron deficiency anemia secondary to  bleeding from the rectal and hematuria  4. Possible colovesical fistula-on the CT 10/09/2013 evaluated by Dr. Alinda Money, followup CT cystogram 10/18/2013 revealed no fistula.  5. Anorexia/weight loss secondary to #1. 6. Indeterminate lung nodules on a CT of the chest 10/09/2013. Stable on CT 03/03/2014. 7. Family history of pancreas and colon cancer. 8. 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 10. Hospitalization 12/10/2013 through 12/12/2013 presenting with diarrhea. CT abdomen/pelvis showed an enlarging rectal mass filling almost the entire pelvis. Air noted in the bladder and renal collecting system. Linear collection of air noted along the anterior wall of the colorectal mass abutting the bladder felt to possibly represent a site of fistulization. Stool noted throughout the right colon. Transferred to [facility]. Neoadjuvant chemotherapy/radiation recommended. 11. Skin toxicity at the perineum related to radiation and Xeloda. 12. Pneumonia on the CT 05/20/2014  Mr. Haven was admitted with a right renal abscess. The abscess has improved with drainage and antibiotics. The restaging CT 05/13/2014 indicated improvement in the rectal mass. I will contact the surgical service at [facility] to decide on the indication for further chemotherapy prior to surgery.  Recommendations: 1. Antibiotics for the renal abscess and pneumonia  2. Outpatient follow-up at the [facility].  3. I will contact the surgical service at [facility] to discuss the indication for additional cycles of chemotherapy prior to resection of the rectal tumor     LOS: 8 days   [place]  05/21/2014, 3:37 PM

## 2014-05-22 DIAGNOSIS — K651 Peritoneal abscess: Secondary | ICD-10-CM

## 2014-05-22 DIAGNOSIS — D638 Anemia in other chronic diseases classified elsewhere: Secondary | ICD-10-CM

## 2014-05-22 MED ORDER — HEPARIN SOD (PORK) LOCK FLUSH 100 UNIT/ML IV SOLN
500.0000 [IU] | INTRAVENOUS | Status: DC | PRN
  Filled 2014-05-22: qty 5

## 2014-05-22 MED ORDER — BOOST PLUS PO LIQD: 237.0000 mL | Freq: Four times a day (QID) | ORAL | Status: DC

## 2014-05-22 MED ORDER — LEVOFLOXACIN 750 MG PO TABS: 750.0000 mg | ORAL_TABLET | Freq: Every day | ORAL | Status: DC

## 2014-05-22 MED ORDER — ALPRAZOLAM 0.5 MG PO TABS: 0.5000 mg | ORAL_TABLET | Freq: Two times a day (BID) | ORAL | Status: DC | PRN

## 2014-05-22 MED ORDER — BOOST PLUS PO LIQD: 237.0000 mL | Freq: Three times a day (TID) | ORAL | Status: DC

## 2014-05-22 MED ORDER — ALBUTEROL SULFATE (2.5 MG/3ML) 0.083% IN NEBU: 2.5000 mg | INHALATION_SOLUTION | Freq: Once | RESPIRATORY_TRACT | Status: DC

## 2014-05-22 MED ORDER — OXYCODONE HCL 10 MG PO TABS: 10.0000 mg | ORAL_TABLET | ORAL | Status: DC | PRN

## 2014-05-22 NOTE — Discharge Instructions (Signed)

## 2014-05-22 NOTE — Progress Notes (Signed)
RN reviewed discharge instructions with patient and family. All questions answered.   Paperwork and prescriptions given. Patient also given some dressing supplies, as well as colostomy supplies.   NT rolled patient down in wheelchair to family car.

## 2014-05-22 NOTE — Discharge Summary (Signed)
Physician Discharge Summary  Grant [name] KVQ:[id number] DOB: [birth year] DOA: 05/13/2014  PCP: Grant A., MD  Admit date: 05/13/2014 Discharge date: 05/22/2014  Recommendations for Outpatient Follow-up:  Renal abscess: Abscess is significantly reduced on CT scan.  Patient will need to continue levofloxacin for 6 weeks and will then need repeat abdominal imaging for reassessment.  Grant. Benay [name] will coordinate repeat imaging based on his oncologic followup imaging in the future.  Right ureteral obstruction/possible fistula: Patient is planning to have surgical removal of his rectal cancer mass in the near future possibly following additional neoadjuvant chemotherapy. Will need to have Grant [name] see [name] in anticipation of that surgery for evaluation as any surgery will likely require cystectomy and need for urinary diversion.  Discharge Diagnoses:  Principal Problem:   Ureteral obstruction, right Active Problems:   Renal cyst   Anemia of chronic disease   Severe protein-calorie malnutrition   CA of rectum   Abdominal abscess   Renal abscess, right    Discharge Condition: stable   Diet recommendation: as tolerated   Hospital Course briefly:  It appears that the abscess is nearly gone. Drainage tube wa pulled.  His lung base via CT showed worsening PNA, but he is not clinically getting worse. He will need [name] consult prior to his surgery as per Grant [name]. WIll continue with current antibiotic at this time as mentioned above.  History of present illness: 59 y.o. 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 He had a staging CT scandone 05/13/2014 which showed an obstructed right kidney due to stone and possible right renal abscess. He subsequently underwent cystolithalopaxy and right ureteral stent exchange with aspiration of 50cc of purulent fluid from right kidney. Cultures have returned with enterococcus/Escherichia coli in urine and microaerophilic strep and abscess.   Assessment/Plan:   Principal Problem:  Ureteral obstruction, right / right hydronephrosis / right renal abscess  S/P cystolithalopaxy and right ureteral stent exchange with aspiration of 50cc of purulent fluid from right kidney.  S/P US guided placement of a 10 F drain into right renal abscess with aspiration of 40 mL of foul smelling pus. Initially treated with Rocephin.  Urine Cultures grew Escherichia coli and enterococcus. Abscess culture grew microaerophilic strep.   Antibiotics changed to Levaquin and this will be continued for 6 weeks on discharge.  Active Problems:  Cancer related pain  Continue oxycodone 10 mg Q 4 hours PRN.     Anemia of chronic disease  Secondary to history of malignancy.  Given 1 unit of packed red blood cells 05/14/14 for hemoglobin of 6.8 mg/dL. Hemoglobin stable.  Severe protein-calorie malnutrition  In the context of chronic illness.   CA of rectum  Management per oncology.   DVT prophylaxis:   Lovenox SQ ordered.  Code Status: Full. Family Communication: No family at bedside today.   IV Access:    Porta-Cath   Procedures and diagnostic studies:   Ct Abdomen Pelvis W Contrast 05/13/2014: Moderate right-sided hydronephrosis with a 4.8  cm ill-defined cystic lesion in the anterior right kidney, generated  mass-effect on the renal pelvis and intrarenal collecting system. This does not appear to be related to a dilated phalanx and intrarenal abscess would be indistinct consideration. Interval development of a 3.3 cm intraluminal bladder stone around the formed loop of the right ureteral catheter. Interval decrease in size of the large irregular rectal mass involving the pelvic floor.   US Renal 05/14/2014: 1. Persistent 6.6 cm Right renal abscess. 2. Interval resolution of hydronephrosis.   [place] Abscess Drain 05/15/2014: 1. Successful placement of a 10 French percutaneous drainage catheter into the right renal abscess with aspiration of 40 mL of frankly purulent and very foul smelling fluid. A sample of fluid was sent for Gram stain and culture. PLAN: 1. Maintain drainage tube to JP bulb suction. 2. Do not flush drainage catheter. Vigorous flushing could result in renal cortical injury and bleeding. 3. Recommend repeat renal ultrasound prior to tube removal to ensure resolution of the abscess collection.  Medical Consultants:    Grant [name], Urology.  Interventional radiology  Anti-Infectives:    Rocephin 05/13/14---> 05/19/14  Levaquin 05/19/14---> for 6 weeks on discharge   Signed:  Leisa Lenz, MD  [facility] 05/22/2014, 11:17 AM  Pager #: [phone]   Discharge Exam: Filed Vitals:   05/22/14 0532  BP: 128/73  Pulse: 83  Temp: 98.2 F (36.8 C)  Resp: 16   Filed Vitals:   05/21/14 1401 05/21/14 2055 05/21/14 2137 05/22/14 0532  BP: 135/74  117/80 128/73  Pulse: 87 82 88 83  Temp: 98.7 F (37.1 C)  98.4 F (36.9 C) 98.2 F (36.8 C)  TempSrc: Oral  Oral Oral  Resp: 16  16 16   Height:      Weight:    65.409 kg (144 lb 3.2 oz)  SpO2: 100%  100% 100%    General: Pt is alert, follows commands appropriately, not in acute distress Cardiovascular: Regular rate and rhythm, S1/S2 +, no murmurs Respiratory: Clear to auscultation bilaterally, no  wheezing, no crackles, no rhonchi Abdominal: Soft, non tender, non distended, bowel sounds +, no guarding Extremities: no edema, no cyanosis, pulses palpable bilaterally DP and PT Neuro: Grossly nonfocal  Discharge Instructions  Discharge Instructions    Call MD for:  difficulty breathing, headache or visual disturbances    Complete by:  As directed      Call MD for:  persistant dizziness or light-headedness    Complete by:  As directed      Call MD for:  persistant nausea and vomiting    Complete by:  As directed      Call MD for:  severe uncontrolled pain    Complete by:  As directed      Diet - low sodium heart healthy    Complete by:  As directed      Discharge instructions    Complete by:  As directed   Renal abscess: Abscess is significantly reduced on CT scan.  Patient will need to continue levofloxacin for 6 weeks and will then need repeat abdominal imaging for reassessment.  Grant. Benay [name] will coordinate repeat imaging based on his oncologic followup imaging in the future.  Right ureteral obstruction/possible fistula: Patient is planning to have surgical removal of his rectal cancer mass in the near future possibly following additional neoadjuvant chemotherapy. Will need to have Mr. Olafson see [facility] in anticipation of that surgery for evaluation as any surgery will likely require cystectomy and need  for urinary diversion.     Increase activity slowly    Complete by:  As directed             Medication List    STOP taking these medications        prochlorperazine 10 MG tablet  Commonly known as:  COMPAZINE      TAKE these medications        ALPRAZolam 0.5 MG tablet  Commonly known as:  XANAX  Take 1 tablet (0.5 mg total) by mouth 2 (two) times daily as needed for anxiety.     capecitabine 500 MG tablet  Commonly known as:  XELODA  Takes 2000 mg in am and 1500 mg in pm X 14 days, 7 day rest. Start date 04/03/14.     [employer] SPECTRAVITE ULTRA MENS Tabs   Take 1 tablet by mouth every morning.     DULCOLAX PO  Take 1 tablet by mouth 2 (two) times daily as needed (constipation/laxative.).     ferrous sulfate 325 (65 FE) MG tablet  Take 1 tablet (325 mg total) by mouth 2 (two) times daily with a meal.     lactose free nutrition Liqd  Take 237 mLs by mouth 4 (four) times daily -  with meals and at bedtime. From four to eight times daily.     lactose free nutrition Liqd  Take 237 mLs by mouth 4 (four) times daily.     levofloxacin 750 MG tablet  Commonly known as:  LEVAQUIN  Take 1 tablet (750 mg total) by mouth daily.     lidocaine-prilocaine cream  Commonly known as:  EMLA  Apply 1 application topically as needed. Apply to [facility] site 1-2 hours prior to stick and cover with plastic wrap     omeprazole 20 MG capsule  Commonly known as:  PRILOSEC  Take 1 capsule (20 mg total) by mouth daily.     ondansetron 4 MG tablet  Commonly known as:  ZOFRAN  Take 1 tablet (4 mg total) by mouth every 8 (eight) hours as needed for nausea or vomiting.     Oxycodone HCl 10 MG Tabs  Take 1 tablet (10 mg total) by mouth every 4 (four) hours as needed for severe pain.     polyethylene glycol packet  Commonly known as:  MIRALAX / GLYCOLAX  Take 17 g by mouth daily as needed for mild constipation.     spironolactone 25 MG tablet  Commonly known as:  ALDACTONE  Take 25 mg by mouth daily as needed (swelling).           Follow-up Information    Follow up with Grant A., MD. Schedule an appointment as soon as possible for a visit in 2 weeks.   Specialty:  Internal Medicine   Why:  Follow up appt after recent hospitalization   Contact information:   [address] [place] [address] [phone]        The results of significant diagnostics from this hospitalization (including imaging, microbiology, ancillary and laboratory) are listed below for reference.    Significant Diagnostic Studies: Ct Abdomen W  Contrast  06-13-2014   CLINICAL DATA:  59 year old male with history of renal abscess status post drainage, now with minimal drain output.  EXAM: CT ABDOMEN WITH CONTRAST  TECHNIQUE: Multidetector CT imaging of the abdomen was performed using the standard protocol following bolus administration of intravenous contrast.  CONTRAST:  165mL OMNIPAQUE IOHEXOL 300 MG/ML  SOLN  COMPARISON:  CT of the abdomen and pelvis 05/12/2014.  FINDINGS: Lower chest: Patchy areas of peribronchovascular micro and macronodularity in the lung bases bilaterally, increased compared to the recent prior study from 05/12/2014, now with a pleural-based area of consolidation in the posterior left lower lobe, most compatible with evolving multilobar bronchopneumonia. Central venous catheter terminating in the right atrium.  Hepatobiliary: Sub cm lesions in the segments 7 and 8 of the liver are too small to definitively characterize, but are similar to prior studies, favored to represent tiny cysts. No other suspicious appearing hepatic lesions. No intra or extrahepatic biliary ductal dilatation. Gallbladder is unremarkable in appearance.  Pancreas: Unremarkable.  Spleen: The spleen is enlarged measuring 14.3 x 6.6 x 15.1 cm (estimated splenic volume of 713 mL).  Adrenals/Urinary Tract: There is a pigtail drainage catheter noted in the in lateral aspect of the upper pole of the right kidney, at the site of the previously diagnosed renal abscess. The abscess cavity has nearly completely resolved compared to the prior study, with a small 2.0 x 0.8 cm residual cavity noted on image 41 of series 2. There is also a right-sided ureteral stent in position, the proximal loop properly reformed in the upper pole collecting system. No right-sided hydroureteronephrosis in the visualized abdomen. Small amount of right-sided perinephric stranding. 11 mm exophytic intermediate to high attenuation area in the lower pole of the right kidney is slightly smaller  than the most recent prior study, but new compared to prior study 12/10/2013, likely a small amount of resolving perinephric hematoma at site of prior percutaneous access for a nephrostomy tube placement. The left kidney is normal in appearance. Bilateral adrenal glands are normal in appearance.  Stomach/Bowel: Normal appearance of the stomach. No pathologic dilatation of small bowel or colon in the visualized portions of the abdomen.  Vascular/Lymphatic: Atherosclerosis in the thoracic aorta, without evidence of aneurysm or dissection. No lymphadenopathy in the visualized portions of the abdomen.  Other: No significant volume of ascites. No pneumoperitoneum. Small amount a subcutaneous emphysema in the anterior abdominal wall.  Musculoskeletal: There are no aggressive appearing lytic or blastic lesions noted in the visualized portions of the skeleton.  IMPRESSION: 1. Pigtail drainage catheter in the previously described right renal abscess has nearly completely drained the abscess at this time. There is only a small 2.0 x 0.8 cm fluid collection remaining in the upper pole of the right kidney. 2. The appearance of the lung bases suggests worsening multilobar bronchopneumonia. 3. Additional findings, as above.   Electronically Signed   By: Vinnie Langton M.D.   On: 05/20/2014 10:46   [place] Abscess Drain  05/15/2014   CLINICAL DATA:  59 year old male with a large abscess in the right kidney despite adequate drainage via internal double-J ureteral stent. He presents for ultrasound guided abscess drain placement.  EXAM: ULTRASOUND GUIDED ABSCESS DRAINAGE  Date: 05/15/2014  PROCEDURE: 1. Ultrasound-guided placement of a 10 French drain into the renal abscess Interventional Radiologist:  Criselda Peaches, MD  ANESTHESIA/SEDATION: Moderate (conscious) sedation was used. Four mg Versed, 100 mcg Fentanyl were administered intravenously. The patient's vital signs were monitored continuously by radiology nursing  throughout the procedure.  Sedation Time: 20 minutes  MEDICATIONS: None additional. Patient receiving Rocephin IV daily for antibiotic coverage.  TECHNIQUE: Informed consent was obtained from the patient following explanation of the procedure, risks, benefits and alternatives. The patient understands, agrees and consents for the procedure. All questions were addressed. A time out was performed.  The right flank  was interrogated with ultrasound. The right kidney, of the liver and the colon were all identified under anatomic positions noted. There is a large complex fluid collection within the kidney corresponding to the known renal abscess. A suitable skin entry site was selected and marked. The region was then sterilely prepped and draped in standard fashion using Betadine skin prep. Local anesthesia was attained by infiltration with 1% lidocaine. Under real-time sonographic guidance, an 18 gauge trocar needle was advanced through a short trans parenchymal tract and into the fluid collection. A short Amplatz wire was then coiled within the fluid collection in the skin tract was dilated to 10 [place].  A Cook 10 [place] all-purpose drainage catheter was then advanced over the wire and formed within the fluid collection under ultrasound guidance. Approximately 40 mL of foul-smelling frankly purulent fluid was then aspirated. The fluid was sent for Gram stain and culture.  The drainage catheter was then very gently flushed and connected to JP bulb suction. The catheter was secured to the skin with 0 Prolene suture and an adhesive fixation device. The patient tolerated the procedure well.  COMPLICATIONS: None  IMPRESSION: 1. Successful placement of a 10 French percutaneous drainage catheter into the right renal abscess with aspiration of 40 mL of frankly purulent and very foul smelling fluid. A sample of fluid was sent for Gram stain and culture.  PLAN: 1. Maintain drainage tube to JP bulb suction. 2. Do not flush  drainage catheter. Vigorous flushing could result in renal cortical injury and bleeding. 3. Recommend repeat renal ultrasound prior to tube removal to ensure resolution of the abscess collection. Signed,  Criselda Peaches, MD  [facility]  [facility]   Electronically Signed   By: Jacqulynn Cadet M.D.   On: 05/15/2014 17:50   Ct Abdomen Pelvis W Contrast  05/13/2014   ADDENDUM REPORT: 05/13/2014 09:41  ADDENDUM: I discussed the results of this study by telephone with Grant. Benay [name] at approximately 0845 hours. Grant. Benay [name] will be seeing the patient in clinic tomorrow. After that discussion, I also spoke with Grant [name] with Urology, who was covering for Grant. Alinda [name]. Grant. Carlton Adam office will contact the patient ASAP in order to arrange definitive management for the obstructed right kidney and probable right renal phlegmon/abscess.   Electronically Signed   By: Misty Stanley M.D.   On: 05/13/2014 09:41   05/13/2014   CLINICAL DATA:  Subsequent encounter for rectal cancer. Nausea and vomiting for 1-2 months duration.  EXAM: CT ABDOMEN AND PELVIS WITH CONTRAST  TECHNIQUE: Multidetector CT imaging of the abdomen and pelvis was performed using the standard protocol following bolus administration of intravenous contrast.  CONTRAST:  8mL OMNIPAQUE IOHEXOL 300 MG/ML SOLN, 158mL OMNIPAQUE IOHEXOL 300 MG/ML SOLN  COMPARISON:  12/10/2013  FINDINGS: Lower chest: 7 mm ground-glass nodule in the right middle lobe on image 1 has been incompletely visualized. Clustered ill-defined nodules in the left lower lobe in a bronchoalveolar distribution (Tree-in-bud) have progressed in the interval. There is trace pericardial fluid.  Hepatobiliary: No focal abnormality within the liver parenchyma. Gallbladder is distended without evidence for wall thickening or stones. Mild prominence of the intrahepatic bile ducts is stable. No dilatation of the extrahepatic common duct.  Pancreas:  No focal mass lesion. No dilatation of the main duct. No intraparenchymal cyst. No peripancreatic edema.  Spleen: No splenomegaly. No focal mass lesion.  Adrenals/Urinary Tract: No adrenal nodule or mass. Left kidney is unremarkable. The right kidney is hydronephrotic  and shows decreased perfusion compared to the left kidney. Cystic change in the anterior right kidney appears out of proportion to the degree of hydronephrosis and generates mass effect on the renal pelvis and intrarenal collecting system. Tiny gas loculate is again noted within a dilated upper pole caliectasis. A double-J right internal ureteral stent is evident. The distal loop of the stent is formed in the decompressed urinary bladder. A large 3.3 x 2.8 cm stone has formed around the distal loop of the catheter, within the bladder lumen.  Stomach/Bowel: Stomach is nondistended. No gastric wall thickening. No evidence of outlet obstruction. Duodenum is normally positioned as is the ligament of Treitz. No evidence for small bowel obstruction. Terminal ileum is normal. The appendix is normal. No evidence for colonic obstruction. The large rectal mass appears to of decreased in the interval. The a amorphous area of irregular enhancement in the central pelvis now measures 9.4 x 8.4 cm compared to 10.6 x 13.8 cm previously. The degree of apparent extraluminal gas associated with this region on the previous study has also decreased in the interval.  Vascular/Lymphatic: No abdominal aortic aneurysm. The portal vein and superior mesenteric vein or prominent, but patent. Splenic vein is patent.  Reproductive: Prostate gland is poorly discerned given the neoplastic changes in the central pelvis.  Other: No substantial intraperitoneal free fluid.  Musculoskeletal: Degenerative changes are noted in both hips.  IMPRESSION: Moderate right-sided hydronephrosis with a 4.8 cm ill-defined cystic lesion in the anterior right kidney, generated mass-effect on the renal  pelvis and intrarenal collecting system. This does not appear to be related to a dilated phalanx and intrarenal abscess would be indistinct consideration.  Interval development of a 3.3 cm intraluminal bladder stone around the formed loop of the right ureteral catheter.  Interval decrease in size of the large irregular rectal mass involving the pelvic floor.  Electronically Signed: By: Misty Stanley M.D. On: 05/13/2014 08:34   US Renal  05/14/2014   CLINICAL DATA:  Renal abscess, hydronephrosis. Recent cystoscopic exchange of occluded ureteral stent.  EXAM: RENAL/URINARY TRACT ULTRASOUND COMPLETE  COMPARISON:  CT 05/12/2014  FINDINGS: Right Kidney:  Length: 11 cm. There is a hypoechoic somewhat lobular 66 x 43 x 48 mm process in the mid upper pole right kidney corresponding to the low-attenuation collection seen on prior CT, not appreciably decompressed. Interval resolution of hydronephrosis. Echogenic regions in the collecting system may represent the double-J stent or gas from recent procedure.  Left Kidney:  Length: 12.3 cm. Echogenicity within normal limits. No mass or hydronephrosis visualized.  Bladder:  Decompressed by Foley catheter.  Distal end of double-J stent noted.  IMPRESSION: 1. Persistent 6.6   cm Right renal abscess. 2. Interval resolution of hydronephrosis.   Electronically Signed   By: Arne Cleveland M.D.   On: 05/14/2014 15:53    Microbiology: Surgical pcr screen     Status: None   Collection Time: 05/13/14  3:58 PM  Result Value Ref Range Status   MRSA, PCR NEGATIVE NEGATIVE Final   Staphylococcus aureus NEGATIVE NEGATIVE Final  Urine culture     Status: None   Collection Time: 05/13/14  6:06 PM  Result Value Ref Range Status   Specimen Description URINE, CATHETERIZED  Final   Special Requests NONE  Final   Colony Count   Final   Culture   Final    ESCHERICHIA COLI ENTEROCOCCUS SPECIES      Susceptibility   Escherichia coli - MIC*    AMPICILLIN <=  2 SENSITIVE Sensitive      CEFAZOLIN <=4 SENSITIVE Sensitive     CEFTRIAXONE <=1 SENSITIVE Sensitive     CIPROFLOXACIN <=0.25 SENSITIVE Sensitive     GENTAMICIN <=1 SENSITIVE Sensitive     LEVOFLOXACIN <=0.12 SENSITIVE Sensitive     NITROFURANTOIN <=16 SENSITIVE Sensitive     TOBRAMYCIN <=1 SENSITIVE Sensitive     TRIMETH/SULFA <=20 SENSITIVE Sensitive     PIP/TAZO <=4 SENSITIVE Sensitive     * ESCHERICHIA COLI   Enterococcus species - MIC*    AMPICILLIN <=2 SENSITIVE Sensitive     LEVOFLOXACIN 1 SENSITIVE Sensitive     NITROFURANTOIN <=16 SENSITIVE Sensitive     VANCOMYCIN 2 SENSITIVE Sensitive     TETRACYCLINE <=1 SENSITIVE Sensitive     * ENTEROCOCCUS SPECIES  Urine culture     Status: None   Collection Time: 05/13/14  6:29 PM  Result Value Ref Range Status   Specimen Description URINE, CATHETERIZED RIGHT RENAL PELVIS  Final   Organism ID, Bacteria STREPTOCOCCUS GROUP D;high probability for S.bovis  Final      Susceptibility   Streptococcus group d;high probability for s.bovis - MIC (ETEST)*    PENICILLIN 0.125 SENSITIVE Sensitive     * STREPTOCOCCUS GROUP D;high probability for S.bovis  Urine culture     Status: None   Collection Time: 05/14/14  5:37 AM  Result Value Ref Range Status   Specimen Description URINE, CATHETERIZED  Final   Special Requests NONE  Final   Colony Count NO GROWTH  Final   Report Status 05/15/2014 FINAL  Final  Culture, routine-abscess     Status: None   Collection Time: 05/15/14 11:41 AM  Result Value Ref Range Status   Specimen Description ABSCESS RT KIDNEY  Final   Special Requests Normal  Final   Gram Stain   Final   Culture   Final    ABUNDANT MICROAEROPHILIC STREPTOCOCCI    Report Status 05/18/2014 FINAL  Final     Labs: Basic Metabolic Panel:  Recent Labs Lab 05/16/14 0348 05/18/14 0610  NA 133* 135  K 4.1 4.7  CL 101 105  CO2 27 26  GLUCOSE 134* 131*  BUN 14 11  CREATININE 0.94 0.92  CALCIUM 8.1* 8.1*   Liver Function Tests: No results for  input(s): AST, ALT, ALKPHOS, BILITOT, PROT, ALBUMIN in the last 168 hours. No results for input(s): LIPASE, AMYLASE in the last 168 hours. No results for input(s): AMMONIA in the last 168 hours. CBC:  Recent Labs Lab 05/16/14 0348 05/18/14 0610  WBC 3.9* 3.5*  HGB 7.6* 7.9*  HCT 24.1* 25.4*  MCV 88.0 88.8  PLT 176 226   Cardiac Enzymes: No results for input(s): CKTOTAL, CKMB, CKMBINDEX, TROPONINI in the last 168 hours. BNP: BNP (last 3 results) No results for input(s): PROBNP in the last 8760 hours. CBG: No results for input(s): GLUCAP in the last 168 hours.  Time coordinating discharge: Over 30 minutes

## 2014-05-23 ENCOUNTER — Other Ambulatory Visit: Payer: Self-pay | Admitting: *Deleted

## 2014-05-23 ENCOUNTER — Ambulatory Visit: Payer: Medicaid Other | Admitting: Nurse Practitioner

## 2014-05-23 ENCOUNTER — Telehealth: Payer: Self-pay | Admitting: Nurse Practitioner

## 2014-05-23 ENCOUNTER — Ambulatory Visit: Payer: Medicaid Other

## 2014-05-23 ENCOUNTER — Other Ambulatory Visit: Payer: Medicaid Other

## 2014-05-23 ENCOUNTER — Other Ambulatory Visit: Payer: Self-pay | Admitting: Oncology

## 2014-05-23 DIAGNOSIS — C20 Malignant neoplasm of rectum: Secondary | ICD-10-CM

## 2014-05-23 MED ORDER — CAPECITABINE 500 MG PO TABS: ORAL_TABLET | ORAL | Status: DC

## 2014-05-23 NOTE — Telephone Encounter (Signed)
Per Dr. Benay Spice, [place] to refill Xeloda, but do not start taking until after appointment on 05/27/14. Notified Delaney that script has been sent to pharmacy and do not start till after visit.

## 2014-05-23 NOTE — Telephone Encounter (Signed)
Already escribed. 

## 2014-05-23 NOTE — Telephone Encounter (Signed)
per pof to sch pt appt-cld & spoke to pt and spouse [name] of appt time & date-they understood-Spouse wantedto spk to nurse about chemo-trans to nurse

## 2014-05-27 ENCOUNTER — Other Ambulatory Visit (HOSPITAL_BASED_OUTPATIENT_CLINIC_OR_DEPARTMENT_OTHER): Payer: Medicaid Other

## 2014-05-27 ENCOUNTER — Telehealth: Payer: Self-pay | Admitting: Nurse Practitioner

## 2014-05-27 ENCOUNTER — Ambulatory Visit (HOSPITAL_BASED_OUTPATIENT_CLINIC_OR_DEPARTMENT_OTHER): Payer: Medicaid Other | Admitting: Nurse Practitioner

## 2014-05-27 ENCOUNTER — Ambulatory Visit: Payer: Medicaid Other | Admitting: Nurse Practitioner

## 2014-05-27 VITALS — BP 108/77 | HR 114 | Temp 98.4°F | Resp 18 | Ht 78.0 in | Wt 138.0 lb

## 2014-05-27 DIAGNOSIS — R52 Pain, unspecified: Secondary | ICD-10-CM

## 2014-05-27 DIAGNOSIS — C20 Malignant neoplasm of rectum: Secondary | ICD-10-CM

## 2014-05-27 DIAGNOSIS — D509 Iron deficiency anemia, unspecified: Secondary | ICD-10-CM

## 2014-05-27 LAB — COMPREHENSIVE METABOLIC PANEL (CC13)
ALT: 9 U/L (ref 0–55)
AST: 14 U/L (ref 5–34)
Albumin: 2.6 g/dL — ABNORMAL LOW (ref 3.5–5.0)
Alkaline Phosphatase: 106 U/L (ref 40–150)
Anion Gap: 10 mEq/L (ref 3–11)
BILIRUBIN TOTAL: 0.36 mg/dL (ref 0.20–1.20)
BUN: 13.7 mg/dL (ref 7.0–26.0)
CHLORIDE: 97 meq/L — AB (ref 98–109)
CO2: 29 mEq/L (ref 22–29)
CREATININE: 1.2 mg/dL (ref 0.7–1.3)
Calcium: 9.3 mg/dL (ref 8.4–10.4)
EGFR: 65 mL/min/{1.73_m2} — ABNORMAL LOW (ref >90)
Glucose: 161 mg/dl — ABNORMAL HIGH (ref 70–140)
Potassium: 4.7 mEq/L (ref 3.5–5.1)
Sodium: 135 mEq/L — ABNORMAL LOW (ref 136–145)
Total Protein: 7 g/dL (ref 6.4–8.3)

## 2014-05-27 LAB — CBC WITH DIFFERENTIAL/PLATELET
BASO%: 0.7 % (ref 0.0–2.0)
BASOS ABS: 0 10*3/uL (ref 0.0–0.1)
EOS ABS: 0 10*3/uL (ref 0.0–0.5)
EOS%: 0.3 % (ref 0.0–7.0)
HCT: 26.8 % — ABNORMAL LOW (ref 38.4–49.9)
HGB: 8.7 g/dL — ABNORMAL LOW (ref 13.0–17.1)
LYMPH%: 2.4 % — ABNORMAL LOW (ref 14.0–49.0)
MCH: 27.8 pg (ref 27.2–33.4)
MCHC: 32.3 g/dL (ref 32.0–36.0)
MCV: 86.1 fL (ref 79.3–98.0)
MONO#: 0.2 10*3/uL (ref 0.1–0.9)
MONO%: 4.1 % (ref 0.0–14.0)
NEUT%: 92.5 % — AB (ref 39.0–75.0)
NEUTROS ABS: 3.8 10*3/uL (ref 1.5–6.5)
Platelets: 269 10*3/uL (ref 140–400)
RBC: 3.12 10*6/uL — AB (ref 4.20–5.82)
RDW: 21.5 % — ABNORMAL HIGH (ref 11.0–14.6)
WBC: 4.2 10*3/uL (ref 4.0–10.3)
lymph#: 0.1 10*3/uL — ABNORMAL LOW (ref 0.9–3.3)

## 2014-05-27 LAB — HOLD TUBE, BLOOD BANK

## 2014-05-27 MED ORDER — HYDROMORPHONE HCL 2 MG PO TABS: 2.0000 mg | ORAL_TABLET | ORAL | Status: DC | PRN

## 2014-05-27 NOTE — Progress Notes (Addendum)
[place] OFFICE PROGRESS NOTE   Diagnosis: Rectal cancer   INTERVAL HISTORY:   Grant [name] was hospitalized 05/13/2014 through 05/22/2014 for management of right hydronephrosis and a right renal abscess. He underwent drainage of the abscess and was started on antibiotics. He was diagnosed with pneumonia while in the hospital. He was discharged home on a 6 week course of Levaquin.  He reports he is feeling much better. He has noted improvement in his appetite and strength. He continues to have rectal pain. He is taking frequent oxycodone. He had Dilaudid in the hospital and noted more effective pain control. He would like a prescription for Dilaudid. He denies nausea/vomiting. Colostomy is functioning. He is passing urine without difficulty. No hematuria.  Objective:  Vital signs in last 24 hours:  Blood pressure 108/77, pulse 114, temperature 98.4 F (36.9 C), temperature source Oral, resp. rate 18, height 6\' 6"  (1.981 m), weight 138 lb (62.596 kg), SpO2 100 %. repeat heart rate 104    HEENT: No thrush or ulcers. Resp: Lungs are clear. Distant breath sounds. Cardio: Regular rate and rhythm. GI: Abdomen is nontender. No hepatomegaly. Left lower quadrant colostomy. Vascular: No leg edema. Port-A-Cath without erythema.   Lab Results:  Lab Results  Component Value Date   WBC 4.2 05/27/2014   HGB 8.7* 05/27/2014   HCT 26.8* 05/27/2014   MCV 86.1 05/27/2014   PLT 269 05/27/2014   NEUTROABS 3.8 05/27/2014    Imaging:  No results found.  Medications: I have reviewed the patient's current medications.  Assessment/Plan: 1. Rectal cancer-large pelvic mass with a colonoscopy 10/11/2013 confirming an obstructing tumor at 9 cm from the anal verge  CTs of the chest, abdomen, and revealed a large complex pelvic mass, gas in the bladder, loculated gas posterior to the bladder, right hydronephrosis, and small bilateral indeterminant lung nodules.   Initiation of  radiation and Xeloda 12/18/2013; completed 01/29/2014.  Restaging CT evaluation at [facility] showed decrease in size of the rectosigmoid mass from 14.6 x 10.5 cm to 9.5 x 8.6 cm. Post radiation changes. Moderate right hydronephrosis with a double-J ureteral stent in place and mild left hydronephrosis, unchanged. Pulmonary nodules unchanged. Circumferential bladder wall thickening likely secondary to radiation. Rectosigmoid mass abuts the posterior bladder and involvement cannot be excluded.  Cycle 1 Capox beginning 03/13/2014  Cycle 2 CAPOX beginning 04/03/2014.  Cycle 3 CAPOX beginning 04/24/2014.  CT 05/13/2014 confirming a decrease in the rectal mass 2. Right hydronephrosis secondary to the obstructing pelvic mass, status post placement of a right percutaneous nephrostomy tube followed by a right ureter stent.  Persistent hydronephrosis with a new right kidney cystic lesion on a CT 05/13/2014  Right renal parenchymal abscess and bladder stone confirmed on a cystoscopy 05/13/2014, status post a cystolithalopaxy, right ureter stent exchange  Placement of a percutaneous right renal abscess drain on 05/15/2014  CT abdomen 05/20/2014 showed near-complete drainage of the right renal abscess. The drainage catheter has been removed.  He is completing a 6 week course of Levaquin. 3. Microcytic anemia-likely iron deficiency anemia secondary to bleeding from the rectal and hematuria  4. Possible colovesical fistula-on the CT 10/09/2013 evaluated by Dr. Alinda Money, followup CT cystogram 10/18/2013 revealed no fistula.  5. Anorexia/weight loss secondary to #1. 6. Indeterminate lung nodules on a CT of the chest 10/09/2013. Stable on CT 03/03/2014. 7. Family history of pancreas and colon cancer. 8. 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 10. Hospitalization 12/10/2013 through 12/12/2013 presenting with diarrhea. CT abdomen/pelvis showed  an enlarging rectal mass filling almost the entire pelvis.  Air noted in the bladder and renal collecting system. Linear collection of air noted along the anterior wall of the colorectal mass abutting the bladder felt to possibly represent a site of fistulization. Stool noted throughout the right colon. Transferred to [facility]. Neoadjuvant chemotherapy/radiation recommended. 11. Skin toxicity at the perineum related to radiation and Xeloda. 12. Pneumonia on the CT 05/20/2014. Currently on Levaquin.   Disposition: Mr. Havey appears stable. Dr. Benay Spice has spoken with the surgeon at [facility]. Once the recent scans have been reviewed they will contact him regarding surgery. Further chemotherapy is currently on hold.  Mr. Fok noted improved pain control with Dilaudid during the recent hospitalization. He was given a prescription today for Dilaudid 2 mg one to 2 tablets every 4 hours as needed. He understands he should not take both Dilaudid and oxycodone.  We scheduled a return visit in 4 weeks. He will contact the office in the interim with any problems.  Patient seen with Dr. Benay Spice.    Ned Card ANP/GNP-BC   05/27/2014  11:37 AM  This was a shared visit with Ned Card. Mr. Rosencrans feels better following treatment of the renal abscess. I discussed the case with Dr.Stizenberg yesterday. She will review the most recent CT and decide on proceeding with resection of the pelvic mass. Mr. Normoyle will be scheduled for a pulmonary [facility].  Chemotherapy will be placed on hold for now.  Julieanne Manson, M.D.

## 2014-05-27 NOTE — Telephone Encounter (Signed)
Pt confirmed labs/ov per 01/12 POF, gave pt AVS.... KJ, cancelled Nut w/BN and sent msg due to chemo has stopped until after surgery per pt... KJ

## 2014-05-30 ENCOUNTER — Ambulatory Visit: Payer: Medicaid Other

## 2014-06-02 ENCOUNTER — Ambulatory Visit (INDEPENDENT_AMBULATORY_CARE_PROVIDER_SITE_OTHER): Payer: Medicaid Other | Admitting: Internal Medicine

## 2014-06-02 ENCOUNTER — Encounter: Payer: Self-pay | Admitting: Internal Medicine

## 2014-06-02 VITALS — BP 126/73 | HR 101 | Temp 100.0°F | Resp 18 | Ht 78.0 in | Wt 143.0 lb

## 2014-06-02 DIAGNOSIS — IMO0002 Reserved for concepts with insufficient information to code with codable children: Secondary | ICD-10-CM

## 2014-06-02 DIAGNOSIS — C20 Malignant neoplasm of rectum: Secondary | ICD-10-CM

## 2014-06-02 DIAGNOSIS — D638 Anemia in other chronic diseases classified elsewhere: Secondary | ICD-10-CM

## 2014-06-02 DIAGNOSIS — K651 Peritoneal abscess: Secondary | ICD-10-CM

## 2014-06-02 DIAGNOSIS — F17220 Nicotine dependence, chewing tobacco, uncomplicated: Secondary | ICD-10-CM

## 2014-06-02 DIAGNOSIS — E43 Unspecified severe protein-calorie malnutrition: Secondary | ICD-10-CM

## 2014-06-02 NOTE — Progress Notes (Signed)
Patient ID: Grant [name], male   DOB: [birth year], 59 y.o.   MRN: [id number] Patient ID: Grant [name], male   DOB: 03/14/1956, 59 y.o.   MRN: [id number]   Grant [name], is a 59 y.o. male  OHY:[id number]  RSW:[id number]  DOB - 11-05-1955  CC:  Chief Complaint  Patient presents with  . Follow-up    hospital follow up        HPI: Grant [name] is a 59 y.o. male here today for a Hospital follow-up visit. He was recently hospitalized with E.coli and Enterococcal UTI and renal abscess.  This led to Urethral obstruction and required stent replacement. He was also treated for HCAP. He was treated with 6 weeks of antibiotics and has completed treatment.    He reports that he has a good urinary stream since having the stent re-placed, and very little particulate seen in the urine.  With regard to his rectal cancer, he has completed neo-adjuvant therapy with Xeloda and Oxaliplatin. He also has a colo-vesicular fistula which is under the care of the surgeon at [facility]. The plan at this time is to discuss resection at [facility] at an appointment schedulked there in the next several weeks. He reports that recently he has had an improvement in appetite and has maintained his weight since last visit. He continues to drink 3 Boosts per day. He denies nausea or vomiting.  He also has microcytic anemia caused from blood loss associated with bleeding from the tumor. He was started on Iron 6 months ago. His indices now show a normal MCV and Hb has improved since last seen in the office.  He reports that he has a good support system with his significant other of more than 20 years. She is present here today with him. I have raised the issue of an advanced directive and patient reports that he has not given this much thought. I have made the patient aware of the MOST form and he and his partner will discuss further.   Pt has no complaints today.  Patient has No headache, No chest pain, No abdominal pain,   No new weakness tingling or numbness, No Cough - SOB.  Allergies  Allergen Reactions  . Strawberry Anaphylaxis and Hives  . Sunflower Oil Anaphylaxis and Hives    seeds  . Watermelon [Citrullus Vulgaris] Anaphylaxis and Hives  . Other     All Melons cantaloupes, etc   Past Medical History  Diagnosis Date  . Fecal incontinence   . 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 Cancer 10/11/13    rectal cancer-adenocarcinoma  . Chronic kidney disease     right stent  . Hx of radiation therapy 12/18/13-01/29/14    RECTAL / 55gy   Current Outpatient Prescriptions on File Prior to Visit  Medication Sig Dispense Refill  . ALPRAZolam (XANAX) 0.5 MG tablet Take 1 tablet (0.5 mg total) by mouth 2 (two) times daily as needed for anxiety. 45 tablet 0  . Bisacodyl (DULCOLAX PO) Take 1 tablet by mouth 2 (two) times daily as needed (constipation/laxative.).    Marland Kitchen ferrous sulfate 325 (65 FE) MG tablet Take 1 tablet (325 mg total) by mouth 2 (two) times daily with a meal. 60 tablet 0  . lactose free nutrition (BOOST PLUS) LIQD Take 237 mLs by mouth 4 (four) times daily -  with meals and at bedtime. From four to eight times daily. 237 mL 0  . lactose free nutrition (BOOST PLUS)  LIQD Take 237 mLs by mouth 4 (four) times daily. 237 mL 0  . levofloxacin (LEVAQUIN) 750 MG tablet Take 1 tablet (750 mg total) by mouth daily. 44 tablet 0  . lidocaine-prilocaine (EMLA) cream Apply 1 application topically as needed. Apply to [facility] site 1-2 hours prior to stick and cover with plastic wrap 30 g 11  . Multiple Vitamins-Minerals ([employer] SPECTRAVITE ULTRA MENS) TABS Take 1 tablet by mouth every morning.     Marland Kitchen omeprazole (PRILOSEC) 20 MG capsule Take 1 capsule (20 mg total) by mouth daily. 30 capsule 3  . ondansetron (ZOFRAN) 4 MG tablet Take 1 tablet (4 mg total) by mouth every 8 (eight) hours as needed for nausea or vomiting. 30 tablet 2  . oxyCODONE 10 MG TABS Take 1 tablet (10 mg total) by mouth every 4 (four)  hours as needed for severe pain. 45 tablet 0  . polyethylene glycol (MIRALAX / GLYCOLAX) packet Take 17 g by mouth daily as needed for mild constipation.    Marland Kitchen spironolactone (ALDACTONE) 25 MG tablet Take 25 mg by mouth daily as needed (swelling).     No current facility-administered medications on file prior to visit.   Family History  Problem Relation Age of Onset  . Cancer Father     pancreatic   . Cancer Maternal Uncle    History   Social History  . Marital Status: Single    Spouse Name: N/A    Number of Children: N/A  . Years of Education: N/A   Occupational History  . Not on file.   Social History Main Topics  . Smoking status: Former Smoker    Quit date: 05/16/1988  . Smokeless tobacco: Current User    Types: Snuff  . Alcohol Use: Yes     Comment: OCCASIONAL   . Drug Use: Yes    Special: Marijuana  . Sexual Activity: Not on file   Other Topics Concern  . Not on file   Social History Narrative    Review of Systems: Constitutional: Negative for fever, chills, diaphoresis, activity change, appetite change and fatigue. HENT: Negative for ear pain, nosebleeds, congestion, facial swelling, rhinorrhea, neck pain, neck stiffness and ear discharge.  Eyes: Negative for pain, discharge, redness, itching and visual disturbance. Respiratory: Negative for cough, choking, chest tightness, shortness of breath, wheezing and stridor.  Cardiovascular: Negative for chest pain, palpitations and leg swelling. Gastrointestinal: Negative for abdominal distention. Genitourinary: Negative for dysuria, urgency, frequency, hematuria, flank pain, decreased urine volume, difficulty urinating.  Musculoskeletal: Negative for back pain, joint swelling, arthralgia and gait problem. Neurological: Negative for dizziness, tremors, seizures, syncope, facial asymmetry, speech difficulty, weakness, light-headedness, numbness and headaches.  Hematological: Negative for adenopathy. Does not  bruise/bleed easily. Psychiatric/Behavioral: Negative for hallucinations, behavioral problems, confusion, dysphoric mood, decreased concentration and agitation.    Objective:   Filed Vitals:   06/02/14 1552  BP: 126/73  Pulse: 101  Temp: 100 F (37.8 C)  Resp: 18    Physical Exam: Constitutional: Patient appears cachectic, emaciated, and chronically ill appearing. HENT: Normocephalic, atraumatic, External right and left ear normal. Oropharynx is clear and moist.  No exudates. Eyes: Conjunctivae and EOM are normal. PERRLA, no scleral icterus. Neck: Normal ROM. Neck supple. No JVD. No tracheal deviation. No thyromegaly. CVS: RRR, S1/S2 +, no murmurs, no gallops, no carotid bruit.  Pulmonary: Effort and breath sounds normal, no stridor, rhonchi, wheezes, rales.  Abdominal: Soft, scaphoid, BS +, no distension, tenderness, rebound or guarding.  Musculoskeletal: Normal range  of motion. No edema and no tenderness.  Neuro: Alert. Normal reflexes, muscle tone coordination. No cranial nerve deficit. Skin: Skin is warm and dry. No rash noted. Not diaphoretic. No erythema. No pallor. Psychiatric: Normal mood and affect. Behavior, judgment, thought content normal.  Lab Results  Component Value Date   WBC 4.2 05/27/2014   HGB 8.7* 05/27/2014   HCT 26.8* 05/27/2014   MCV 86.1 05/27/2014   PLT 269 05/27/2014   Lab Results  Component Value Date   CREATININE 1.2 05/27/2014   BUN 13.7 05/27/2014   NA 135* 05/27/2014   K 4.7 05/27/2014   CL 105 05/18/2014   CO2 29 05/27/2014    No results found for: HGBA1C Lipid Panel     Component Value Date/Time   TRIG 88 10/14/2013 0500       Assessment and plan:   [facility] Transition visit 1. Colorectal Cancer with associated colo-vesicular fistula and now renal abscess and urethral obstruction  - Pt doing well. No acute needs at this time. He has an appointment with Dr. Randon Goldsmith (Surgical Oncology) on 06/12/2014 at [facility].  His hope is  that he can proceed expediently with the surgical resection of anal mass.  - He has good urinary stream    2. Severe protein-calorie malnutrition - Pt has been seeing the Dietitian at he [place]. He is taking boost protein supplements. We have discussed the significance of good nutrition in chronic disease state.   5. Hyperglycemia - Pt has had several elevated blood sugar readings. Will screen for diabetes. He did not have his labs drawn since the last office visit in 04/21/2014. - Hemoglobin A1C  6. Microcytic anemia - Pt was started on Iron supplementation 6 months ago. Will check Iron stores. He feels that oral Iron is causing nausea and I willevaluate iron to see if we can adjust the dose to a lesser dose. - Hb improved since last visit   Follow-up in 3 months (after surgery).  The patient was given clear instructions to go to ER or return to medical center if symptoms don't improve, worsen or new problems develop. The patient verbalized understanding. The patient was told to call to get lab results if they haven't heard anything in the next week.     This note has been created with Surveyor, quantity. Any transcriptional errors are unintentional.    Malanie Koloski A., MD [place], [place]   06/02/2014, 4:28 PM

## 2014-06-13 ENCOUNTER — Encounter: Payer: Self-pay | Admitting: Nutrition

## 2014-06-24 ENCOUNTER — Ambulatory Visit: Payer: Medicaid Other | Admitting: Oncology

## 2014-06-24 ENCOUNTER — Other Ambulatory Visit: Payer: Medicaid Other

## 2014-07-14 ENCOUNTER — Other Ambulatory Visit: Payer: Self-pay | Admitting: *Deleted

## 2014-07-15 ENCOUNTER — Telehealth: Payer: Self-pay | Admitting: Oncology

## 2014-07-15 NOTE — Telephone Encounter (Signed)
Lft msg for pt confirming MD visit per 02/29 POF, mailed sch to pt ... KJ

## 2014-07-21 ENCOUNTER — Ambulatory Visit: Payer: Self-pay | Admitting: Internal Medicine

## 2014-08-11 ENCOUNTER — Telehealth: Payer: Self-pay | Admitting: Oncology

## 2014-08-11 ENCOUNTER — Other Ambulatory Visit: Payer: Self-pay | Admitting: *Deleted

## 2014-08-11 ENCOUNTER — Ambulatory Visit (HOSPITAL_BASED_OUTPATIENT_CLINIC_OR_DEPARTMENT_OTHER): Payer: Medicaid Other | Admitting: Oncology

## 2014-08-11 ENCOUNTER — Telehealth: Payer: Self-pay | Admitting: *Deleted

## 2014-08-11 VITALS — BP 134/77 | HR 118 | Temp 98.9°F | Resp 18 | Ht 78.0 in | Wt 144.7 lb

## 2014-08-11 DIAGNOSIS — G8918 Other acute postprocedural pain: Secondary | ICD-10-CM

## 2014-08-11 DIAGNOSIS — C20 Malignant neoplasm of rectum: Secondary | ICD-10-CM

## 2014-08-11 DIAGNOSIS — R200 Anesthesia of skin: Secondary | ICD-10-CM

## 2014-08-11 MED ORDER — OXYCODONE HCL 5 MG PO TABS: ORAL_TABLET | ORAL | Status: DC

## 2014-08-11 MED ORDER — OXYCODONE-ACETAMINOPHEN 5-325 MG PO TABS
2.0000 | ORAL_TABLET | Freq: Once | ORAL | Status: AC
Start: 2014-08-11 — End: 2014-08-11
  Administered 2014-08-11: 2 via ORAL

## 2014-08-11 MED ORDER — CAPECITABINE 500 MG PO TABS: ORAL_TABLET | ORAL | Status: DC

## 2014-08-11 MED ORDER — OXYCODONE-ACETAMINOPHEN 5-325 MG PO TABS
ORAL_TABLET | ORAL | Status: AC
  Filled 2014-08-11: qty 2

## 2014-08-11 NOTE — Telephone Encounter (Signed)
Gave avs & calendar for APril. Sent message to schedule treatment

## 2014-08-11 NOTE — Progress Notes (Signed)
[place] OFFICE PROGRESS NOTE   Diagnosis: Rectal cancer  INTERVAL HISTORY:   Grant [name] returns as scheduled. He underwent a pelvic exoneration at [facility] on 06/24/2014. Surgery included removal of a portion of the sacrum, the bladder, and rectum/anus. He received intraoperative radiation to the right sciatic foramen. This was an R0 resection. The pathology revealed a moderately differentiated adenocarcinoma invading the bladder and ureter. Lymphovascular and perineural invasion were not identified. The radial margin was less than 1 mm. 14 lymph nodes were negative for metastatic disease. The bladder was involved by adenocarcinoma. The left ureter was involved by adenocarcinoma at the ureteropelvic junction. There was extensive residual tumor consistent with a poor response to previous therapy.  Grant [name] reports gaining strength over the past several weeks. Good appetite. The urostomy and colostomy are functioning well. There has been some difficulty with leakage at the urostomy site. He complains of pain at the sacrum. The pain is partially relieved with 10 mg of oxycodone. He developed numbness in the right leg ,right foot, and left foot following surgery. He is ambulate with a walker. No numbness in the hands. No focal weakness.  Objective:  Vital signs in last 24 hours:  Blood pressure 134/77, pulse 118, temperature 98.9 F (37.2 C), temperature source Oral, resp. rate 18, height 6\' 6"  (1.981 m), weight 144 lb 11.2 oz (65.635 kg), SpO2 99 %.    HEENT: Neck without mass Lymphatics: No cervical, supra-clavicular, axillary, or inguinal nodes Resp: Lungs with decreased breath sounds at the right base, no respiratory distress Cardio: Regular rate and rhythm, tachycardia GI: No hepatomegaly, right lower quadrant urostomy, left lower quadrant colostomy, no mass, healed. Healed scar without evidence of infection Vascular: No leg edema Neuro: The leg and foot strength  appear intact bilaterally   Portacath/PICC-without erythema  Medications: I have reviewed the patient's current medications.  Assessment/Plan: 1. Rectal cancer-large pelvic mass with a colonoscopy 10/11/2013 confirming an obstructing tumor at 9 cm from the anal verge  CTs of the chest, abdomen, and revealed a large complex pelvic mass, gas in the bladder, loculated gas posterior to the bladder, right hydronephrosis, and small bilateral indeterminant lung nodules.   Initiation of radiation and Xeloda 12/18/2013; completed 01/29/2014.  Restaging CT evaluation at [facility] showed decrease in size of the rectosigmoid mass from 14.6 x 10.5 cm to 9.5 x 8.6 cm. Post radiation changes. Moderate right hydronephrosis with a double-J ureteral stent in place and mild left hydronephrosis, unchanged. Pulmonary nodules unchanged. Circumferential bladder wall thickening likely secondary to radiation. Rectosigmoid mass abuts the posterior bladder and involvement cannot be excluded.  Cycle 1 Capox beginning 03/13/2014  Cycle 2 CAPOX beginning 04/03/2014.  Cycle 3 CAPOX beginning 04/24/2014.  CT 05/13/2014 confirming a decrease in the rectal mass  Pelvic exoneration at [facility] on 06/24/2014 confirming a ypT4,ypN0 tumor. The anterior and posterior cervical frontal margins were less than 1 mm, tumor involved the bladder and left ureter. Surgically included an APR, cystectomy, ileal conduit, intraoperative radiation, and sacrectomy, and vertical rectus myocutaneous flap 2. Right hydronephrosis secondary to the obstructing pelvic mass, status post placement of a right percutaneous nephrostomy tube followed by a right ureter stent.  Persistent hydronephrosis with a new right kidney cystic lesion on a CT 05/13/2014  Right renal parenchymal abscess and bladder stone confirmed on a cystoscopy 05/13/2014, status post a cystolithalopaxy, right ureter stent exchange  Placement of a percutaneous right renal abscess drain on  05/15/2014  CT abdomen 05/20/2014 showed near-complete drainage  of the right renal abscess. The drainage catheter has been removed.  He completed a 6 week course of Levaquin. 3. Microcytic anemia-likely iron deficiency anemia secondary to bleeding from the rectal and hematuria  4. Possible colovesical fistula-on the CT 10/09/2013 evaluated by Dr. Alinda Money, followup CT cystogram 10/18/2013 revealed no fistula.  5. Anorexia/weight loss secondary to #1. 6. Indeterminate lung nodules on a CT of the chest 10/09/2013. Stable on CT 03/03/2014. 7. Family history of pancreas and colon cancer. 8. 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 9. History of a Colovesical fistula. 10. Hospitalization 12/10/2013 through 12/12/2013 presenting with diarrhea. CT abdomen/pelvis showed an enlarging rectal mass filling almost the entire pelvis. Air noted in the bladder and renal collecting system. Linear collection of air noted along the anterior wall of the colorectal mass abutting the bladder felt to possibly represent a site of fistulization. Stool noted throughout the right colon. Transferred to [facility]. Neoadjuvant chemotherapy/radiation recommended. 11. Skin toxicity at the perineum related to radiation and Xeloda. 12. Pneumonia on the CT 05/20/2014. Currently on Levaquin. 13. Right greater than left leg and foot numbness following the sacrectomy 14. Sacrum pain following the APR/ sacrectomy   Disposition:  Grant [name] is recovering from the pelvic exoneration. I recommend completing several additional cycles of adjuvant CAPOX chemotherapy. He agrees to proceed with another cycle of chemotherapy beginning 08/25/2014. He has pain from the APR and sacrum resection. He will continue oxycodone as needed for pain. Hopefully the foot numbness will improve with time. He will return for an office visit prior to chemotherapy 08/25/2014.  Betsy Coder, MD  08/11/2014  12:54 PM

## 2014-08-11 NOTE — Telephone Encounter (Signed)
Per staff message and POF I have scheduled appts. Advised scheduler of appts. JMW  

## 2014-08-25 ENCOUNTER — Telehealth: Payer: Self-pay | Admitting: Oncology

## 2014-08-25 ENCOUNTER — Encounter: Payer: Self-pay | Admitting: *Deleted

## 2014-08-25 ENCOUNTER — Ambulatory Visit (HOSPITAL_BASED_OUTPATIENT_CLINIC_OR_DEPARTMENT_OTHER): Payer: Medicaid Other | Admitting: Nurse Practitioner

## 2014-08-25 ENCOUNTER — Other Ambulatory Visit (HOSPITAL_BASED_OUTPATIENT_CLINIC_OR_DEPARTMENT_OTHER): Payer: Medicaid Other

## 2014-08-25 ENCOUNTER — Other Ambulatory Visit: Payer: Medicaid Other

## 2014-08-25 ENCOUNTER — Other Ambulatory Visit: Payer: Self-pay | Admitting: Oncology

## 2014-08-25 ENCOUNTER — Other Ambulatory Visit: Payer: Self-pay | Admitting: *Deleted

## 2014-08-25 ENCOUNTER — Ambulatory Visit: Payer: Medicaid Other | Admitting: Nurse Practitioner

## 2014-08-25 ENCOUNTER — Ambulatory Visit (HOSPITAL_BASED_OUTPATIENT_CLINIC_OR_DEPARTMENT_OTHER): Payer: Medicaid Other

## 2014-08-25 VITALS — BP 120/82 | HR 102 | Temp 98.2°F | Resp 18 | Ht 78.0 in | Wt 151.6 lb

## 2014-08-25 DIAGNOSIS — C20 Malignant neoplasm of rectum: Secondary | ICD-10-CM

## 2014-08-25 DIAGNOSIS — R630 Anorexia: Secondary | ICD-10-CM

## 2014-08-25 DIAGNOSIS — Z5111 Encounter for antineoplastic chemotherapy: Secondary | ICD-10-CM | POA: Diagnosis present

## 2014-08-25 DIAGNOSIS — R634 Abnormal weight loss: Secondary | ICD-10-CM

## 2014-08-25 DIAGNOSIS — D500 Iron deficiency anemia secondary to blood loss (chronic): Secondary | ICD-10-CM | POA: Diagnosis not present

## 2014-08-25 LAB — CBC WITH DIFFERENTIAL/PLATELET
BASO%: 0.6 % (ref 0.0–2.0)
Basophils Absolute: 0 10*3/uL (ref 0.0–0.1)
EOS%: 4.4 % (ref 0.0–7.0)
Eosinophils Absolute: 0.2 10*3/uL (ref 0.0–0.5)
HEMATOCRIT: 36.3 % — AB (ref 38.4–49.9)
HGB: 11.7 g/dL — ABNORMAL LOW (ref 13.0–17.1)
LYMPH#: 0.4 10*3/uL — AB (ref 0.9–3.3)
LYMPH%: 10.8 % — ABNORMAL LOW (ref 14.0–49.0)
MCH: 28 pg (ref 27.2–33.4)
MCHC: 32.2 g/dL (ref 32.0–36.0)
MCV: 87.1 fL (ref 79.3–98.0)
MONO#: 0.2 10*3/uL (ref 0.1–0.9)
MONO%: 6.3 % (ref 0.0–14.0)
NEUT#: 2.8 10*3/uL (ref 1.5–6.5)
NEUT%: 77.9 % — ABNORMAL HIGH (ref 39.0–75.0)
Platelets: 237 10*3/uL (ref 140–400)
RBC: 4.17 10*6/uL — AB (ref 4.20–5.82)
RDW: 16.2 % — ABNORMAL HIGH (ref 11.0–14.6)
WBC: 3.6 10*3/uL — AB (ref 4.0–10.3)

## 2014-08-25 LAB — COMPREHENSIVE METABOLIC PANEL (CC13)
ALK PHOS: 67 U/L (ref 40–150)
ALT: 7 U/L (ref 0–55)
AST: 13 U/L (ref 5–34)
Albumin: 3.4 g/dL — ABNORMAL LOW (ref 3.5–5.0)
Anion Gap: 10 mEq/L (ref 3–11)
BUN: 16.2 mg/dL (ref 7.0–26.0)
CO2: 30 meq/L — AB (ref 22–29)
CREATININE: 1 mg/dL (ref 0.7–1.3)
Calcium: 9.7 mg/dL (ref 8.4–10.4)
Chloride: 102 mEq/L (ref 98–109)
EGFR: 81 mL/min/{1.73_m2} — ABNORMAL LOW (ref >90)
Glucose: 100 mg/dl (ref 70–140)
Potassium: 4.9 mEq/L (ref 3.5–5.1)
Sodium: 142 mEq/L (ref 136–145)
Total Bilirubin: 0.42 mg/dL (ref 0.20–1.20)
Total Protein: 6.9 g/dL (ref 6.4–8.3)

## 2014-08-25 MED ORDER — PALONOSETRON HCL INJECTION 0.25 MG/5ML
INTRAVENOUS | Status: AC
  Filled 2014-08-25: qty 5

## 2014-08-25 MED ORDER — DEXTROSE 5 % IV SOLN
Freq: Once | INTRAVENOUS | Status: AC
  Administered 2014-08-25: 14:00:00 via INTRAVENOUS

## 2014-08-25 MED ORDER — PROCHLORPERAZINE MALEATE 10 MG PO TABS: 10.0000 mg | ORAL_TABLET | Freq: Four times a day (QID) | ORAL | Status: DC | PRN

## 2014-08-25 MED ORDER — SODIUM CHLORIDE 0.9 % IV SOLN
10.0000 mg | Freq: Once | INTRAVENOUS | Status: AC
  Administered 2014-08-25: 10 mg via INTRAVENOUS
  Filled 2014-08-25: qty 1

## 2014-08-25 MED ORDER — PALONOSETRON HCL INJECTION 0.25 MG/5ML
0.2500 mg | Freq: Once | INTRAVENOUS | Status: AC
  Administered 2014-08-25: 0.25 mg via INTRAVENOUS

## 2014-08-25 MED ORDER — HEPARIN SOD (PORK) LOCK FLUSH 100 UNIT/ML IV SOLN
500.0000 [IU] | Freq: Once | INTRAVENOUS | Status: AC | PRN
  Administered 2014-08-25: 500 [IU]
  Filled 2014-08-25: qty 5

## 2014-08-25 MED ORDER — OXALIPLATIN CHEMO INJECTION 100 MG/20ML
100.0000 mg/m2 | Freq: Once | INTRAVENOUS | Status: AC
  Administered 2014-08-25: 185 mg via INTRAVENOUS
  Filled 2014-08-25: qty 37

## 2014-08-25 MED ORDER — SODIUM CHLORIDE 0.9 % IJ SOLN
10.0000 mL | INTRAMUSCULAR | Status: DC | PRN
  Administered 2014-08-25: 10 mL
  Filled 2014-08-25: qty 10

## 2014-08-25 NOTE — [facility] Oncology Navigator Note (Signed)
Met with patient during and significant other chemotherapy treatment to provide support and assess for needs to promote continuity of care. 1. Discussed antiemetic regimen-hold Zofran for 72 hours-use compazine now as needed 2. Ostomy support group-call if he continues to have irritation around his ostomy stoma 3. Frustrated with his medicaid care-caseworker unable to find paperwork. Wife will try to contact her again this week and call back if they continue to have difficulty.  Recent death in family has brought them added stress, but he says he is coping fairly well.  Merceda Elks, RN, BSN GI Oncology [place]

## 2014-08-25 NOTE — Telephone Encounter (Signed)
gave and printed appt sched and avs for pt for May....sed added tx.  °

## 2014-08-25 NOTE — Progress Notes (Signed)
[facility] OFFICE PROGRESS NOTE   Diagnosis:  Rectal cancer  INTERVAL HISTORY:   Grant [name] returns as scheduled. He is feeling stronger. Appetite is better. He is gaining weight. He denies nausea/vomiting. No diarrhea. He has persistent numbness in the feet. He continues to have pain at the "tailbone". He is taking oxycodone frequently.  Objective:  Vital signs in last 24 hours:  Blood pressure 120/82, pulse 102, temperature 98.2 F (36.8 C), temperature source Oral, resp. rate 18, height 6\' 6"  (1.981 m), weight 151 lb 9.6 oz (68.765 kg), SpO2 100 %.    HEENT: No thrush or ulcers. Resp: Lungs clear bilaterally. Cardio: Regular rate and rhythm. GI: Abdomen soft and nontender. No hepatomegaly. Right lower quadrant urostomy. Left lower quadrant colostomy. Vascular: No leg edema. Port-A-Cath without erythema.  Lab Results:  Lab Results  Component Value Date   WBC 3.6* 08/25/2014   HGB 11.7* 08/25/2014   HCT 36.3* 08/25/2014   MCV 87.1 08/25/2014   PLT 237 08/25/2014   NEUTROABS 2.8 08/25/2014    Imaging:  No results found.  Medications: I have reviewed the patient's current medications.  Assessment/Plan: 1. Rectal cancer-large pelvic mass with a colonoscopy 10/11/2013 confirming an obstructing tumor at 9 cm from the anal verge  CTs of the chest, abdomen, and revealed a large complex pelvic mass, gas in the bladder, loculated gas posterior to the bladder, right hydronephrosis, and small bilateral indeterminant lung nodules.   Initiation of radiation and Xeloda 12/18/2013; completed 01/29/2014.  Restaging CT evaluation at [facility] showed decrease in size of the rectosigmoid mass from 14.6 x 10.5 cm to 9.5 x 8.6 cm. Post radiation changes. Moderate right hydronephrosis with a double-J ureteral stent in place and mild left hydronephrosis, unchanged. Pulmonary nodules unchanged. Circumferential bladder wall thickening likely secondary to radiation.  Rectosigmoid mass abuts the posterior bladder and involvement cannot be excluded.  Cycle 1 Capox beginning 03/13/2014  Cycle 2 CAPOX beginning 04/03/2014.  Cycle 3 CAPOX beginning 04/24/2014.  CT 05/13/2014 confirming a decrease in the rectal mass  Pelvic exoneration at [facility] on 06/24/2014 confirming a ypT4,ypN0 tumor. The anterior and posterior cervical frontal margins were less than 1 mm, tumor involved the bladder and left ureter. Surgically included an APR, cystectomy, ileal conduit, intraoperative radiation, and sacrectomy, and vertical rectus myocutaneous flap  Cycle 1 adjuvant CAPOX 08/25/2014 2. Right hydronephrosis secondary to the obstructing pelvic mass, status post placement of a right percutaneous nephrostomy tube followed by a right ureter stent.  Persistent hydronephrosis with a new right kidney cystic lesion on a CT 05/13/2014  Right renal parenchymal abscess and bladder stone confirmed on a cystoscopy 05/13/2014, status post a cystolithalopaxy, right ureter stent exchange  Placement of a percutaneous right renal abscess drain on 05/15/2014  CT abdomen 05/20/2014 showed near-complete drainage of the right renal abscess. The drainage catheter has been removed.  He completed a 6 week course of Levaquin. 3. Microcytic anemia-likely iron deficiency anemia secondary to bleeding from the rectal and hematuria  4. Possible colovesical fistula-on the CT 10/09/2013 evaluated by Dr. Alinda Money, followup CT cystogram 10/18/2013 revealed no fistula.  5. Anorexia/weight loss secondary to #1. 6. Indeterminate lung nodules on a CT of the chest 10/09/2013. Stable on CT 03/03/2014. 7. Family history of pancreas and colon cancer. 8. 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 9. History of a Colovesical fistula. 10. Hospitalization 12/10/2013 through 12/12/2013 presenting with diarrhea. CT abdomen/pelvis showed an enlarging rectal mass filling almost the entire pelvis. Air noted in the bladder and  renal  collecting system. Linear collection of air noted along the anterior wall of the colorectal mass abutting the bladder felt to possibly represent a site of fistulization. Stool noted throughout the right colon. Transferred to [facility]. Neoadjuvant chemotherapy/radiation recommended. 11. Skin toxicity at the perineum related to radiation and Xeloda. 12. Pneumonia on the CT 05/20/2014. He completed a course of Levaquin. 13. Right greater than left leg and foot numbness following the sacrectomy 14. Sacrum pain following the APR/ sacrectomy   Disposition: Grant [name] appears stable. Plan to proceed with cycle 1 adjuvant CAPOX today as scheduled. We discussed that the white count is mildly decreased. He understands to contact the office with fever, chills, other signs of infection. He will return for a follow-up visit and cycle 2 CAPOX in 3 weeks. 4 cycles of adjuvant CAPOX are planned.  Plan reviewed with Dr. Benay Spice.  Ned Card ANP/GNP-BC   08/25/2014  12:47 PM

## 2014-08-25 NOTE — Patient Instructions (Signed)
[facility] Discharge Instructions for Patients Receiving Chemotherapy  Today you received the following chemotherapy agents Oxaliplatin  To help prevent nausea and vomiting after your treatment, we encourage you to take your nausea medication as directed.   If you develop nausea and vomiting that is not controlled by your nausea medication, call the clinic.   BELOW ARE SYMPTOMS THAT SHOULD BE REPORTED IMMEDIATELY:  *FEVER GREATER THAN 100.5 F  *CHILLS WITH OR WITHOUT FEVER  NAUSEA AND VOMITING THAT IS NOT CONTROLLED WITH YOUR NAUSEA MEDICATION  *UNUSUAL SHORTNESS OF BREATH  *UNUSUAL BRUISING OR BLEEDING  TENDERNESS IN MOUTH AND THROAT WITH OR WITHOUT PRESENCE OF ULCERS  *URINARY PROBLEMS  *BOWEL PROBLEMS  UNUSUAL RASH Items with * indicate a potential emergency and should be followed up as soon as possible.  Feel free to call the clinic you have any questions or concerns. The clinic phone number is [phone].  Please show the CHEMO ALERT CARD at check-in to the Emergency Department and triage nurse.    

## 2014-08-29 ENCOUNTER — Other Ambulatory Visit: Payer: Self-pay | Admitting: *Deleted

## 2014-08-29 MED ORDER — OXYCODONE HCL 5 MG PO TABS: ORAL_TABLET | ORAL | Status: DC

## 2014-09-14 ENCOUNTER — Other Ambulatory Visit: Payer: Self-pay | Admitting: Oncology

## 2014-09-15 ENCOUNTER — Ambulatory Visit (HOSPITAL_BASED_OUTPATIENT_CLINIC_OR_DEPARTMENT_OTHER): Payer: Medicaid Other | Admitting: Oncology

## 2014-09-15 ENCOUNTER — Other Ambulatory Visit (HOSPITAL_BASED_OUTPATIENT_CLINIC_OR_DEPARTMENT_OTHER): Payer: Medicaid Other

## 2014-09-15 ENCOUNTER — Telehealth: Payer: Self-pay | Admitting: Oncology

## 2014-09-15 ENCOUNTER — Ambulatory Visit (HOSPITAL_BASED_OUTPATIENT_CLINIC_OR_DEPARTMENT_OTHER): Payer: Medicaid Other

## 2014-09-15 ENCOUNTER — Other Ambulatory Visit: Payer: Self-pay | Admitting: *Deleted

## 2014-09-15 VITALS — BP 116/75 | HR 97 | Temp 98.3°F | Resp 18 | Ht 78.0 in | Wt 154.9 lb

## 2014-09-15 DIAGNOSIS — Z5111 Encounter for antineoplastic chemotherapy: Secondary | ICD-10-CM | POA: Diagnosis present

## 2014-09-15 DIAGNOSIS — R630 Anorexia: Secondary | ICD-10-CM

## 2014-09-15 DIAGNOSIS — C20 Malignant neoplasm of rectum: Secondary | ICD-10-CM

## 2014-09-15 DIAGNOSIS — D500 Iron deficiency anemia secondary to blood loss (chronic): Secondary | ICD-10-CM

## 2014-09-15 DIAGNOSIS — R634 Abnormal weight loss: Secondary | ICD-10-CM | POA: Diagnosis not present

## 2014-09-15 LAB — COMPREHENSIVE METABOLIC PANEL (CC13)
ALT: 14 U/L (ref 0–55)
ANION GAP: 10 meq/L (ref 3–11)
AST: 19 U/L (ref 5–34)
Albumin: 3.6 g/dL (ref 3.5–5.0)
Alkaline Phosphatase: 59 U/L (ref 40–150)
BUN: 9.5 mg/dL (ref 7.0–26.0)
CALCIUM: 9.4 mg/dL (ref 8.4–10.4)
CO2: 27 meq/L (ref 22–29)
Chloride: 105 mEq/L (ref 98–109)
Creatinine: 0.9 mg/dL (ref 0.7–1.3)
EGFR: 89 mL/min/{1.73_m2} — ABNORMAL LOW (ref >90)
Glucose: 85 mg/dl (ref 70–140)
Potassium: 4.8 mEq/L (ref 3.5–5.1)
Sodium: 141 mEq/L (ref 136–145)
Total Bilirubin: 0.47 mg/dL (ref 0.20–1.20)
Total Protein: 6.6 g/dL (ref 6.4–8.3)

## 2014-09-15 LAB — CBC WITH DIFFERENTIAL/PLATELET
BASO%: 0.2 % (ref 0.0–2.0)
BASOS ABS: 0 10*3/uL (ref 0.0–0.1)
EOS%: 0.9 % (ref 0.0–7.0)
Eosinophils Absolute: 0 10*3/uL (ref 0.0–0.5)
HEMATOCRIT: 35.9 % — AB (ref 38.4–49.9)
HGB: 11.8 g/dL — ABNORMAL LOW (ref 13.0–17.1)
LYMPH#: 0.3 10*3/uL — AB (ref 0.9–3.3)
LYMPH%: 9.8 % — ABNORMAL LOW (ref 14.0–49.0)
MCH: 29.2 pg (ref 27.2–33.4)
MCHC: 32.8 g/dL (ref 32.0–36.0)
MCV: 89.1 fL (ref 79.3–98.0)
MONO#: 0.3 10*3/uL (ref 0.1–0.9)
MONO%: 9.3 % (ref 0.0–14.0)
NEUT%: 79.8 % — AB (ref 39.0–75.0)
NEUTROS ABS: 2.6 10*3/uL (ref 1.5–6.5)
PLATELETS: 206 10*3/uL (ref 140–400)
RBC: 4.03 10*6/uL — ABNORMAL LOW (ref 4.20–5.82)
RDW: 15.8 % — AB (ref 11.0–14.6)
WBC: 3.2 10*3/uL — ABNORMAL LOW (ref 4.0–10.3)

## 2014-09-15 MED ORDER — SODIUM CHLORIDE 0.9 % IV SOLN
10.0000 mg | Freq: Once | INTRAVENOUS | Status: AC
  Administered 2014-09-15: 10 mg via INTRAVENOUS
  Filled 2014-09-15: qty 1

## 2014-09-15 MED ORDER — OXALIPLATIN CHEMO INJECTION 100 MG/20ML
100.0000 mg/m2 | Freq: Once | INTRAVENOUS | Status: AC
  Administered 2014-09-15: 185 mg via INTRAVENOUS
  Filled 2014-09-15: qty 37

## 2014-09-15 MED ORDER — OXYCODONE HCL 5 MG PO TABS: ORAL_TABLET | ORAL | Status: DC

## 2014-09-15 MED ORDER — CAPECITABINE 500 MG PO TABS: ORAL_TABLET | ORAL | Status: DC

## 2014-09-15 MED ORDER — PALONOSETRON HCL INJECTION 0.25 MG/5ML
0.2500 mg | Freq: Once | INTRAVENOUS | Status: AC
  Administered 2014-09-15: 0.25 mg via INTRAVENOUS

## 2014-09-15 MED ORDER — DEXTROSE 5 % IV SOLN
250.0000 mL | Freq: Once | INTRAVENOUS | Status: AC
  Administered 2014-09-15: 13:00:00 via INTRAVENOUS

## 2014-09-15 MED ORDER — ALPRAZOLAM 0.5 MG PO TABS: 0.5000 mg | ORAL_TABLET | Freq: Two times a day (BID) | ORAL | Status: DC | PRN

## 2014-09-15 NOTE — Progress Notes (Signed)
[facility] OFFICE PROGRESS NOTE   Diagnosis: Rectal cancer  INTERVAL HISTORY:   Grant [name] returns as scheduled. Grant [name] completed a cycle of CAPOX beginning 08/25/2014. No mouth sores or nausea. Grant [name] reports diarrhea for 3 days during his off week from chemotherapy. No hand or foot pain. Grant [name] continues to have numbness and pain in the legs. Grant [name] relates this to the sacral surgery. No numbness in the hands. The urostomy and colostomy are functioning well. No pelvic pain. Good appetite.  Objective:  Vital signs in last 24 hours:  Blood pressure 116/75, pulse 97, temperature 98.3 F (36.8 C), temperature source Oral, resp. rate 18, height 6\' 6"  (1.981 m), weight 154 lb 14.4 oz (70.262 kg), SpO2 100 %.    HEENT: No thrush or ulcers Resp: Lungs clear bilaterally Cardio: Regular rate and rhythm GI: No hepatosplenomegaly, right lower quadrant urostomy, left lower quadrant colostomy Vascular: No leg edema Neuro: Very mild decrease in vibratory sense at the fingertips bilaterally  Skin: Mild hyperpigmentation of the palms   Portacath/PICC-without erythema  Lab Results:  Lab Results  Component Value Date   WBC 3.2* 09/15/2014   HGB 11.8* 09/15/2014   HCT 35.9* 09/15/2014   MCV 89.1 09/15/2014   PLT 206 09/15/2014   NEUTROABS 2.6 09/15/2014      Lab Results  Component Value Date   CEA 3.7 10/10/2013    Medications: I have reviewed the patient's current medications.  Assessment/Plan: 1. Rectal cancer-large pelvic mass with a colonoscopy 10/11/2013 confirming an obstructing tumor at 9 cm from the anal verge  CTs of the chest, abdomen, and revealed a large complex pelvic mass, gas in the bladder, loculated gas posterior to the bladder, right hydronephrosis, and small bilateral indeterminant lung nodules.   Initiation of radiation and Xeloda 12/18/2013; completed 01/29/2014.  Restaging CT evaluation at [facility] showed decrease in size of the rectosigmoid mass from 14.6  x 10.5 cm to 9.5 x 8.6 cm. Post radiation changes. Moderate right hydronephrosis with a double-J ureteral stent in place and mild left hydronephrosis, unchanged. Pulmonary nodules unchanged. Circumferential bladder wall thickening likely secondary to radiation. Rectosigmoid mass abuts the posterior bladder and involvement cannot be excluded.  Cycle 1 Capox beginning 03/13/2014  Cycle 2 CAPOX beginning 04/03/2014.  Cycle 3 CAPOX beginning 04/24/2014.  CT 05/13/2014 confirming a decrease in the rectal mass  Pelvic exoneration at [facility] on 06/24/2014 confirming a ypT4,ypN0 tumor. The anterior and posterior cervical frontal margins were less than 1 mm, tumor involved the bladder and left ureter. Surgically included an APR, cystectomy, ileal conduit, intraoperative radiation, and sacrectomy, and vertical rectus myocutaneous flap  Cycle 1 adjuvant CAPOX 08/25/2014  Cycle 2 adjuvant Capox 09/15/2014 2. Right hydronephrosis secondary to the obstructing pelvic mass, status post placement of a right percutaneous nephrostomy tube followed by a right ureter stent.  Persistent hydronephrosis with a new right kidney cystic lesion on a CT 05/13/2014  Right renal parenchymal abscess and bladder stone confirmed on a cystoscopy 05/13/2014, status post a cystolithalopaxy, right ureter stent exchange  Placement of a percutaneous right renal abscess drain on 05/15/2014  CT abdomen 05/20/2014 showed near-complete drainage of the right renal abscess. The drainage catheter has been removed.  Grant [name] completed a 6 week course of Levaquin. 3. Microcytic anemia-likely iron deficiency anemia secondary to bleeding from the rectal and hematuria  4. Possible colovesical fistula-on the CT 10/09/2013 evaluated by Dr. Alinda Money, followup CT cystogram 10/18/2013 revealed no fistula.  5. Anorexia/weight loss secondary to #1. 6.  Indeterminate lung nodules on a CT of the chest 10/09/2013. Stable on CT 03/03/2014. 7. Family history  of pancreas and colon cancer. 8. 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 9. History of a Colovesical fistula. 10. Hospitalization 12/10/2013 through 12/12/2013 presenting with diarrhea. CT abdomen/pelvis showed an enlarging rectal mass filling almost the entire pelvis. Air noted in the bladder and renal collecting system. Linear collection of air noted along the anterior wall of the colorectal mass abutting the bladder felt to possibly represent a site of fistulization. Stool noted throughout the right colon. Transferred to [facility]. Neoadjuvant chemotherapy/radiation recommended. 11. Skin toxicity at the perineum related to radiation and Xeloda. 12. Pneumonia on the CT 05/20/2014. Grant [name] completed a course of Levaquin. 13. Right greater than left leg and foot numbness following the sacrectomy 14. Sacrum pain following the APR/ sacrectomy  Disposition:  Grant [name] tolerated the chemotherapy well. The plan is to proceed with another cycle of adjuvant Capox today. Grant [name] will return for an office visit and chemotherapy in 3 weeks. Grant [name] will continue oxycodone as needed for pain.  Grant Coder, MD  09/15/2014  12:02 PM

## 2014-09-15 NOTE — Telephone Encounter (Signed)
per pof ot sch pt appt-sent MW emailt o sch trmt-gave pt copy of sch-pt has MY CHART and will see updated sch

## 2014-09-15 NOTE — Patient Instructions (Signed)
[facility] Discharge Instructions for Patients Receiving Chemotherapy  Today you received the following chemotherapy agents Oxaliplatin  To help prevent nausea and vomiting after your treatment, we encourage you to take your nausea medication as directed.   If you develop nausea and vomiting that is not controlled by your nausea medication, call the clinic.   BELOW ARE SYMPTOMS THAT SHOULD BE REPORTED IMMEDIATELY:  *FEVER GREATER THAN 100.5 F  *CHILLS WITH OR WITHOUT FEVER  NAUSEA AND VOMITING THAT IS NOT CONTROLLED WITH YOUR NAUSEA MEDICATION  *UNUSUAL SHORTNESS OF BREATH  *UNUSUAL BRUISING OR BLEEDING  TENDERNESS IN MOUTH AND THROAT WITH OR WITHOUT PRESENCE OF ULCERS  *URINARY PROBLEMS  *BOWEL PROBLEMS  UNUSUAL RASH Items with * indicate a potential emergency and should be followed up as soon as possible.  Feel free to call the clinic you have any questions or concerns. The clinic phone number is [phone].  Please show the CHEMO ALERT CARD at check-in to the Emergency Department and triage nurse.    

## 2014-10-05 ENCOUNTER — Other Ambulatory Visit: Payer: Self-pay | Admitting: Oncology

## 2014-10-06 ENCOUNTER — Ambulatory Visit (HOSPITAL_BASED_OUTPATIENT_CLINIC_OR_DEPARTMENT_OTHER): Payer: Medicaid Other | Admitting: Nurse Practitioner

## 2014-10-06 ENCOUNTER — Other Ambulatory Visit (HOSPITAL_BASED_OUTPATIENT_CLINIC_OR_DEPARTMENT_OTHER): Payer: Medicaid Other

## 2014-10-06 ENCOUNTER — Telehealth: Payer: Self-pay | Admitting: *Deleted

## 2014-10-06 ENCOUNTER — Telehealth: Payer: Self-pay | Admitting: Oncology

## 2014-10-06 ENCOUNTER — Ambulatory Visit (HOSPITAL_BASED_OUTPATIENT_CLINIC_OR_DEPARTMENT_OTHER): Payer: Medicaid Other

## 2014-10-06 VITALS — BP 122/82 | HR 92 | Temp 98.3°F | Resp 18 | Ht 78.0 in | Wt 160.0 lb

## 2014-10-06 DIAGNOSIS — Z5111 Encounter for antineoplastic chemotherapy: Secondary | ICD-10-CM | POA: Diagnosis not present

## 2014-10-06 DIAGNOSIS — C20 Malignant neoplasm of rectum: Secondary | ICD-10-CM

## 2014-10-06 DIAGNOSIS — R634 Abnormal weight loss: Secondary | ICD-10-CM | POA: Diagnosis not present

## 2014-10-06 DIAGNOSIS — D500 Iron deficiency anemia secondary to blood loss (chronic): Secondary | ICD-10-CM | POA: Diagnosis not present

## 2014-10-06 DIAGNOSIS — R630 Anorexia: Secondary | ICD-10-CM | POA: Diagnosis not present

## 2014-10-06 LAB — CBC WITH DIFFERENTIAL/PLATELET
BASO%: 0.6 % (ref 0.0–2.0)
BASOS ABS: 0 10*3/uL (ref 0.0–0.1)
EOS ABS: 0.1 10*3/uL (ref 0.0–0.5)
EOS%: 3.2 % (ref 0.0–7.0)
HCT: 36.3 % — ABNORMAL LOW (ref 38.4–49.9)
HEMOGLOBIN: 12.1 g/dL — AB (ref 13.0–17.1)
LYMPH%: 9.7 % — AB (ref 14.0–49.0)
MCH: 30.6 pg (ref 27.2–33.4)
MCHC: 33.4 g/dL (ref 32.0–36.0)
MCV: 91.7 fL (ref 79.3–98.0)
MONO#: 0.3 10*3/uL (ref 0.1–0.9)
MONO%: 9.1 % (ref 0.0–14.0)
NEUT#: 2.3 10*3/uL (ref 1.5–6.5)
NEUT%: 77.4 % — AB (ref 39.0–75.0)
PLATELETS: 175 10*3/uL (ref 140–400)
RBC: 3.96 10*6/uL — ABNORMAL LOW (ref 4.20–5.82)
RDW: 22.7 % — ABNORMAL HIGH (ref 11.0–14.6)
WBC: 3 10*3/uL — AB (ref 4.0–10.3)
lymph#: 0.3 10*3/uL — ABNORMAL LOW (ref 0.9–3.3)

## 2014-10-06 LAB — COMPREHENSIVE METABOLIC PANEL (CC13)
ALK PHOS: 60 U/L (ref 40–150)
ALT: 18 U/L (ref 0–55)
AST: 22 U/L (ref 5–34)
Albumin: 3.7 g/dL (ref 3.5–5.0)
Anion Gap: 13 mEq/L — ABNORMAL HIGH (ref 3–11)
BILIRUBIN TOTAL: 0.74 mg/dL (ref 0.20–1.20)
BUN: 11.4 mg/dL (ref 7.0–26.0)
CO2: 22 meq/L (ref 22–29)
Calcium: 9.5 mg/dL (ref 8.4–10.4)
Chloride: 105 mEq/L (ref 98–109)
Creatinine: 0.9 mg/dL (ref 0.7–1.3)
EGFR: 89 mL/min/{1.73_m2} — AB (ref >90)
Glucose: 88 mg/dl (ref 70–140)
Potassium: 4.5 mEq/L (ref 3.5–5.1)
SODIUM: 140 meq/L (ref 136–145)
TOTAL PROTEIN: 6.7 g/dL (ref 6.4–8.3)

## 2014-10-06 MED ORDER — HEPARIN SOD (PORK) LOCK FLUSH 100 UNIT/ML IV SOLN
500.0000 [IU] | Freq: Once | INTRAVENOUS | Status: AC | PRN
  Administered 2014-10-06: 500 [IU]
  Filled 2014-10-06: qty 5

## 2014-10-06 MED ORDER — DEXTROSE 5 % IV SOLN
Freq: Once | INTRAVENOUS | Status: AC
  Administered 2014-10-06: 14:00:00 via INTRAVENOUS

## 2014-10-06 MED ORDER — OXYCODONE HCL 5 MG PO TABS: ORAL_TABLET | ORAL | Status: DC

## 2014-10-06 MED ORDER — PALONOSETRON HCL INJECTION 0.25 MG/5ML
0.2500 mg | Freq: Once | INTRAVENOUS | Status: AC
  Administered 2014-10-06: 0.25 mg via INTRAVENOUS

## 2014-10-06 MED ORDER — DEXTROSE 5 % IV SOLN
100.0000 mg/m2 | Freq: Once | INTRAVENOUS | Status: AC
  Administered 2014-10-06: 185 mg via INTRAVENOUS
  Filled 2014-10-06: qty 37

## 2014-10-06 MED ORDER — SODIUM CHLORIDE 0.9 % IV SOLN
10.0000 mg | Freq: Once | INTRAVENOUS | Status: AC
  Administered 2014-10-06: 10 mg via INTRAVENOUS
  Filled 2014-10-06: qty 1

## 2014-10-06 MED ORDER — CAPECITABINE 500 MG PO TABS: ORAL_TABLET | ORAL | Status: DC

## 2014-10-06 MED ORDER — SODIUM CHLORIDE 0.9 % IJ SOLN
10.0000 mL | INTRAMUSCULAR | Status: DC | PRN
  Administered 2014-10-06: 10 mL
  Filled 2014-10-06: qty 10

## 2014-10-06 MED ORDER — PALONOSETRON HCL INJECTION 0.25 MG/5ML
INTRAVENOUS | Status: AC
  Filled 2014-10-06: qty 5

## 2014-10-06 MED ORDER — ALPRAZOLAM 0.5 MG PO TABS: 0.5000 mg | ORAL_TABLET | Freq: Two times a day (BID) | ORAL | Status: DC | PRN

## 2014-10-06 NOTE — Telephone Encounter (Signed)
Pt confirmed labs/ov per 05/23 POF, gave pt AVS and Calendar.... KJ, sent msg to add chemo °

## 2014-10-06 NOTE — Telephone Encounter (Signed)
Per staff message and POF I have scheduled appts. Advised scheduler of appts. JMW  

## 2014-10-06 NOTE — Progress Notes (Signed)
[facility] OFFICE PROGRESS NOTE   Diagnosis:  Rectal cancer  INTERVAL HISTORY:   Grant [name] returns as scheduled. He completed cycle 2 adjuvant CAPOX beginning 09/15/2014. He denies nausea/vomiting. He has had a few episodes of loose stools over the past 2 weeks. No mouth sores. No hand or foot pain or redness. He avoids cold exposure. He has persistent numbness in the feet, right leg and certains areas of the left leg. The numbness has been present since surgery. Pain at the tailbone is stable. He continues oxycodone. Appetite is better. He is gaining weight.  Objective:  Vital signs in last 24 hours:  Blood pressure 122/82, pulse 92, temperature 98.3 F (36.8 C), temperature source Oral, resp. rate 18, height 6\' 6"  (1.981 m), weight 160 lb (72.576 kg), SpO2 99 %.    HEENT: No thrush or ulcers. Resp: Lungs clear bilaterally. Cardio: Regular rate and rhythm. GI: Abdomen soft and nontender. No hepatomegaly. Right lower quadrant urostomy. Left lower quadrant colostomy. Vascular: No leg edema.  Neuro: Vibratory sense intact over the fingertips per tuning fork exam.  Skin: Palms and soles without erythema. Palms with skin thickening. No skin breakdown. Port-A-Cath without erythema.    Lab Results:  Lab Results  Component Value Date   WBC 3.0* 10/06/2014   HGB 12.1* 10/06/2014   HCT 36.3* 10/06/2014   MCV 91.7 10/06/2014   PLT 175 10/06/2014   NEUTROABS 2.3 10/06/2014    Imaging:  No results found.  Medications: I have reviewed the patient's current medications.  Assessment/Plan: 1. Rectal cancer-large pelvic mass with a colonoscopy 10/11/2013 confirming an obstructing tumor at 9 cm from the anal verge  CTs of the chest, abdomen, and revealed a large complex pelvic mass, gas in the bladder, loculated gas posterior to the bladder, right hydronephrosis, and small bilateral indeterminant lung nodules.   Initiation of radiation and Xeloda 12/18/2013;  completed 01/29/2014.  Restaging CT evaluation at [facility] showed decrease in size of the rectosigmoid mass from 14.6 x 10.5 cm to 9.5 x 8.6 cm. Post radiation changes. Moderate right hydronephrosis with a double-J ureteral stent in place and mild left hydronephrosis, unchanged. Pulmonary nodules unchanged. Circumferential bladder wall thickening likely secondary to radiation. Rectosigmoid mass abuts the posterior bladder and involvement cannot be excluded.  Cycle 1 Capox beginning 03/13/2014  Cycle 2 CAPOX beginning 04/03/2014.  Cycle 3 CAPOX beginning 04/24/2014.  CT 05/13/2014 confirming a decrease in the rectal mass  Pelvic exoneration at [facility] on 06/24/2014 confirming a ypT4,ypN0 tumor. The anterior and posterior cervical frontal margins were less than 1 mm, tumor involved the bladder and left ureter. Surgically included an APR, cystectomy, ileal conduit, intraoperative radiation, and sacrectomy, and vertical rectus myocutaneous flap  Cycle 1 adjuvant CAPOX 08/25/2014  Cycle 2 adjuvant CAPOX 09/15/2014  Cycle 3 adjuvant CAPOX 10/06/2014 2. Right hydronephrosis secondary to the obstructing pelvic mass, status post placement of a right percutaneous nephrostomy tube followed by a right ureter stent.  Persistent hydronephrosis with a new right kidney cystic lesion on a CT 05/13/2014  Right renal parenchymal abscess and bladder stone confirmed on a cystoscopy 05/13/2014, status post a cystolithalopaxy, right ureter stent exchange  Placement of a percutaneous right renal abscess drain on 05/15/2014  CT abdomen 05/20/2014 showed near-complete drainage of the right renal abscess. The drainage catheter has been removed.  He completed a 6 week course of Levaquin. 3. Microcytic anemia-likely iron deficiency anemia secondary to bleeding from the rectal and hematuria  4. Possible colovesical fistula-on  the CT 10/09/2013 evaluated by Dr. Alinda [name], followup CT cystogram 10/18/2013 revealed no fistula.   5. Anorexia/weight loss secondary to #1. 6. Indeterminate lung nodules on a CT of the chest 10/09/2013. Stable on CT 03/03/2014. 7. Family history of pancreas and colon cancer. 8. 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 9. History of a Colovesical fistula. 10. Hospitalization 12/10/2013 through 12/12/2013 presenting with diarrhea. CT abdomen/pelvis showed an enlarging rectal mass filling almost the entire pelvis. Air noted in the bladder and renal collecting system. Linear collection of air noted along the anterior wall of the colorectal mass abutting the bladder felt to possibly represent a site of fistulization. Stool noted throughout the right colon. Transferred to [facility]. Neoadjuvant chemotherapy/radiation recommended. 11. Skin toxicity at the perineum related to radiation and Xeloda. 12. Pneumonia on the CT 05/20/2014. He completed a course of Levaquin. 13. Right greater than left leg and foot numbness following the sacrectomy 14. Sacrum pain following the APR/ sacrectomy   Disposition: Grant [name] appears stable. He has completed 2 cycles of adjuvant CAPOX. Plan to proceed with cycle 3 today as scheduled. He will return for a follow-up visit and cycle 4 in 3 weeks. He will contact the office in the interim with any problems. We specifically discussed poorly controlled diarrhea. He was issued new prescriptions for oxycodone and Xanax at today's visit.  Plan reviewed with Dr. Benay [name].    Grant [name] ANP/GNP-BC   10/06/2014  12:32 PM

## 2014-10-06 NOTE — Patient Instructions (Signed)
[facility] Discharge Instructions for Patients Receiving Chemotherapy  Today you received the following chemotherapy agents Oxaliplatin  To help prevent nausea and vomiting after your treatment, we encourage you to take your nausea medication as directed.   If you develop nausea and vomiting that is not controlled by your nausea medication, call the clinic.   BELOW ARE SYMPTOMS THAT SHOULD BE REPORTED IMMEDIATELY:  *FEVER GREATER THAN 100.5 F  *CHILLS WITH OR WITHOUT FEVER  NAUSEA AND VOMITING THAT IS NOT CONTROLLED WITH YOUR NAUSEA MEDICATION  *UNUSUAL SHORTNESS OF BREATH  *UNUSUAL BRUISING OR BLEEDING  TENDERNESS IN MOUTH AND THROAT WITH OR WITHOUT PRESENCE OF ULCERS  *URINARY PROBLEMS  *BOWEL PROBLEMS  UNUSUAL RASH Items with * indicate a potential emergency and should be followed up as soon as possible.  Feel free to call the clinic you have any questions or concerns. The clinic phone number is [phone].  Please show the CHEMO ALERT CARD at check-in to the Emergency Department and triage nurse.    

## 2014-10-17 ENCOUNTER — Telehealth: Payer: Self-pay | Admitting: *Deleted

## 2014-10-17 NOTE — Telephone Encounter (Signed)
Oncology Nurse Navigator Documentation  Oncology Nurse Navigator Flowsheets 10/17/2014  Navigator Encounter Type Telephone-spoke with significant olther  Treatment Phase Treatment -s/p cycle #6  Barriers/Navigation Needs No barriers at this time-denies any sores on hands, feet or mouth. No diarrhea. Mild nausea-eating and drinking. His Medicaid was extended.   Time Spent with Patient 10

## 2014-10-26 ENCOUNTER — Other Ambulatory Visit: Payer: Self-pay | Admitting: Oncology

## 2014-10-27 ENCOUNTER — Other Ambulatory Visit (HOSPITAL_BASED_OUTPATIENT_CLINIC_OR_DEPARTMENT_OTHER): Payer: Medicaid Other

## 2014-10-27 ENCOUNTER — Ambulatory Visit (HOSPITAL_BASED_OUTPATIENT_CLINIC_OR_DEPARTMENT_OTHER): Payer: Medicaid Other | Admitting: Oncology

## 2014-10-27 ENCOUNTER — Ambulatory Visit (HOSPITAL_BASED_OUTPATIENT_CLINIC_OR_DEPARTMENT_OTHER): Payer: Medicaid Other

## 2014-10-27 VITALS — BP 110/77 | HR 95 | Temp 98.4°F | Resp 18 | Ht 78.0 in | Wt 158.0 lb

## 2014-10-27 DIAGNOSIS — C20 Malignant neoplasm of rectum: Secondary | ICD-10-CM

## 2014-10-27 DIAGNOSIS — R634 Abnormal weight loss: Secondary | ICD-10-CM | POA: Diagnosis not present

## 2014-10-27 DIAGNOSIS — D509 Iron deficiency anemia, unspecified: Secondary | ICD-10-CM

## 2014-10-27 DIAGNOSIS — Z5111 Encounter for antineoplastic chemotherapy: Secondary | ICD-10-CM | POA: Diagnosis not present

## 2014-10-27 DIAGNOSIS — R630 Anorexia: Secondary | ICD-10-CM | POA: Diagnosis not present

## 2014-10-27 LAB — COMPREHENSIVE METABOLIC PANEL (CC13)
ALK PHOS: 71 U/L (ref 40–150)
ALT: 21 U/L (ref 0–55)
AST: 31 U/L (ref 5–34)
Albumin: 3.5 g/dL (ref 3.5–5.0)
Anion Gap: 10 mEq/L (ref 3–11)
BUN: 10.9 mg/dL (ref 7.0–26.0)
CO2: 24 meq/L (ref 22–29)
Calcium: 9.6 mg/dL (ref 8.4–10.4)
Chloride: 107 mEq/L (ref 98–109)
Creatinine: 1.1 mg/dL (ref 0.7–1.3)
EGFR: 77 mL/min/{1.73_m2} — AB (ref >90)
Glucose: 93 mg/dl (ref 70–140)
Potassium: 4.6 mEq/L (ref 3.5–5.1)
Sodium: 141 mEq/L (ref 136–145)
Total Bilirubin: 1.06 mg/dL (ref 0.20–1.20)
Total Protein: 6.6 g/dL (ref 6.4–8.3)

## 2014-10-27 LAB — CBC WITH DIFFERENTIAL/PLATELET
BASO%: 0.2 % (ref 0.0–2.0)
BASOS ABS: 0 10*3/uL (ref 0.0–0.1)
EOS ABS: 0.1 10*3/uL (ref 0.0–0.5)
EOS%: 3.9 % (ref 0.0–7.0)
HCT: 36.8 % — ABNORMAL LOW (ref 38.4–49.9)
HGB: 12.3 g/dL — ABNORMAL LOW (ref 13.0–17.1)
LYMPH%: 6.9 % — AB (ref 14.0–49.0)
MCH: 31.7 pg (ref 27.2–33.4)
MCHC: 33.4 g/dL (ref 32.0–36.0)
MCV: 94.9 fL (ref 79.3–98.0)
MONO#: 0.3 10*3/uL (ref 0.1–0.9)
MONO%: 8.5 % (ref 0.0–14.0)
NEUT#: 2.4 10*3/uL (ref 1.5–6.5)
NEUT%: 80.5 % — ABNORMAL HIGH (ref 39.0–75.0)
PLATELETS: 116 10*3/uL — AB (ref 140–400)
RBC: 3.88 10*6/uL — ABNORMAL LOW (ref 4.20–5.82)
RDW: 24.5 % — ABNORMAL HIGH (ref 11.0–14.6)
WBC: 3 10*3/uL — ABNORMAL LOW (ref 4.0–10.3)
lymph#: 0.2 10*3/uL — ABNORMAL LOW (ref 0.9–3.3)

## 2014-10-27 MED ORDER — PALONOSETRON HCL INJECTION 0.25 MG/5ML
0.2500 mg | Freq: Once | INTRAVENOUS | Status: AC
  Administered 2014-10-27: 0.25 mg via INTRAVENOUS

## 2014-10-27 MED ORDER — SODIUM CHLORIDE 0.9 % IJ SOLN
10.0000 mL | INTRAMUSCULAR | Status: DC | PRN
  Administered 2014-10-27: 10 mL
  Filled 2014-10-27: qty 10

## 2014-10-27 MED ORDER — OXYCODONE HCL 5 MG PO TABS
ORAL_TABLET | ORAL | Status: DC
Start: 2014-10-27 — End: 2014-11-24

## 2014-10-27 MED ORDER — PALONOSETRON HCL INJECTION 0.25 MG/5ML
INTRAVENOUS | Status: AC
  Filled 2014-10-27: qty 5

## 2014-10-27 MED ORDER — CAPECITABINE 500 MG PO TABS: ORAL_TABLET | ORAL | Status: DC

## 2014-10-27 MED ORDER — HEPARIN SOD (PORK) LOCK FLUSH 100 UNIT/ML IV SOLN
500.0000 [IU] | Freq: Once | INTRAVENOUS | Status: AC | PRN
  Administered 2014-10-27: 500 [IU]
  Filled 2014-10-27: qty 5

## 2014-10-27 MED ORDER — SODIUM CHLORIDE 0.9 % IV SOLN
10.0000 mg | Freq: Once | INTRAVENOUS | Status: AC
  Administered 2014-10-27: 10 mg via INTRAVENOUS
  Filled 2014-10-27: qty 1

## 2014-10-27 MED ORDER — OXALIPLATIN CHEMO INJECTION 100 MG/20ML
100.0000 mg/m2 | Freq: Once | INTRAVENOUS | Status: AC
  Administered 2014-10-27: 185 mg via INTRAVENOUS
  Filled 2014-10-27: qty 37

## 2014-10-27 MED ORDER — ALPRAZOLAM 0.5 MG PO TABS: 0.5000 mg | ORAL_TABLET | Freq: Two times a day (BID) | ORAL | Status: DC | PRN

## 2014-10-27 MED ORDER — DEXTROSE 5 % IV SOLN
Freq: Once | INTRAVENOUS | Status: AC
  Administered 2014-10-27: 10:00:00 via INTRAVENOUS

## 2014-10-27 NOTE — Progress Notes (Signed)
[place] OFFICE PROGRESS NOTE   Diagnosis: Rectal cancer  INTERVAL HISTORY:   Grant [name] returns as scheduled. He completed another cycle Capox beginning 10/06/2014. He reports cold sensitivity lasting 3-4 days following chemotherapy, he then had tingling in the hands. No numbness in the hands at present. He continues to have numbness in the legs and feet that he relates to the sacral surgery. The urostomy and colostomy are functioning well. He reports malaise. No nausea, vomiting, or diarrhea. He continues to take oxycodone for pain.  Objective:  Vital signs in last 24 hours:  Blood pressure 110/77, pulse 95, temperature 98.4 F (36.9 C), temperature source Oral, resp. rate 18, height 6\' 6"  (1.981 m), weight 158 lb (71.668 kg), SpO2 100 %.    HEENT: No thrush or ulcers Resp: Lungs clear bilaterally Cardio: Regular rate and rhythm GI: No hepatomegaly, nontender, right lower quadrant urostomy, left lower quadrant colostomy Vascular: No leg edema Neuro: The vibratory sense is intact at the fingertips bilaterally    Portacath/PICC-without erythema  Lab Results:  Lab Results  Component Value Date   WBC 3.0* 10/27/2014   HGB 12.3* 10/27/2014   HCT 36.8* 10/27/2014   MCV 94.9 10/27/2014   PLT 116* 10/27/2014   NEUTROABS 2.4 10/27/2014     Lab Results  Component Value Date   CEA 3.7 10/10/2013    Medications: I have reviewed the patient's current medications.  Assessment/Plan: 1. Rectal cancer-large pelvic mass with a colonoscopy 10/11/2013 confirming an obstructing tumor at 9 cm from the anal verge  CTs of the chest, abdomen, and revealed a large complex pelvic mass, gas in the bladder, loculated gas posterior to the bladder, right hydronephrosis, and small bilateral indeterminant lung nodules.   Initiation of radiation and Xeloda 12/18/2013; completed 01/29/2014.  Restaging CT evaluation at [facility] showed decrease in size of the rectosigmoid mass  from 14.6 x 10.5 cm to 9.5 x 8.6 cm. Post radiation changes. Moderate right hydronephrosis with a double-J ureteral stent in place and mild left hydronephrosis, unchanged. Pulmonary nodules unchanged. Circumferential bladder wall thickening likely secondary to radiation. Rectosigmoid mass abuts the posterior bladder and involvement cannot be excluded.  Cycle 1 Capox beginning 03/13/2014  Cycle 2 CAPOX beginning 04/03/2014.  Cycle 3 CAPOX beginning 04/24/2014.  CT 05/13/2014 confirming a decrease in the rectal mass  Pelvic exoneration at [facility] on 06/24/2014 confirming a ypT4,ypN0 tumor. The anterior and posterior cervical frontal margins were less than 1 mm, tumor involved the bladder and left ureter. Surgically included an APR, cystectomy, ileal conduit, intraoperative radiation, and sacrectomy, and vertical rectus myocutaneous flap  Cycle 1 adjuvant CAPOX 08/25/2014  Cycle 2 adjuvant CAPOX 09/15/2014  Cycle 3 adjuvant CAPOX 10/06/2014  Cycle 4 adjuvant CAPOX 10/27/2014 2. Right hydronephrosis secondary to the obstructing pelvic mass, status post placement of a right percutaneous nephrostomy tube followed by a right ureter stent.  Persistent hydronephrosis with a new right kidney cystic lesion on a CT 05/13/2014  Right renal parenchymal abscess and bladder stone confirmed on a cystoscopy 05/13/2014, status post a cystolithalopaxy, right ureter stent exchange  Placement of a percutaneous right renal abscess drain on 05/15/2014  CT abdomen 05/20/2014 showed near-complete drainage of the right renal abscess. The drainage catheter has been removed.  He completed a 6 week course of Levaquin. 3. Microcytic anemia-likely iron deficiency anemia secondary to bleeding from the rectal and hematuria  4. Possible colovesical fistula-on the CT 10/09/2013 evaluated by Dr. Alinda Money, followup CT cystogram 10/18/2013 revealed no fistula.  5. Anorexia/weight loss secondary to #1. 6. Indeterminate lung  nodules on a CT of the chest 10/09/2013. Stable on CT 03/03/2014. 7. Family history of pancreas and colon cancer. 8. 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 9. History of a Colovesical fistula. 10. Hospitalization 12/10/2013 through 12/12/2013 presenting with diarrhea. CT abdomen/pelvis showed an enlarging rectal mass filling almost the entire pelvis. Air noted in the bladder and renal collecting system. Linear collection of air noted along the anterior wall of the colorectal mass abutting the bladder felt to possibly represent a site of fistulization. Stool noted throughout the right colon. Transferred to [facility]. Neoadjuvant chemotherapy/radiation recommended. 11. Skin toxicity at the perineum related to radiation and Xeloda. 12. Pneumonia on the CT 05/20/2014. He completed a course of Levaquin. 13. Right greater than left leg and foot numbness following the sacrectomy 14. Sacrum pain following the APR/ sacrectomy  Disposition:  Grant [name] appears stable. He is tolerating the chemotherapy well. The plan is to proceed with cycle 4 of adjuvant CAPOX today. He will return for an office visit and the final planned cycle of adjuvant chemotherapy in 3 weeks.  Betsy Coder, MD  10/27/2014  9:27 AM

## 2014-10-27 NOTE — Patient Instructions (Signed)
[facility] Discharge Instructions for Patients Receiving Chemotherapy  Today you received the following chemotherapy agents Oxaliplatin  To help prevent nausea and vomiting after your treatment, we encourage you to take your nausea medication as directed.   If you develop nausea and vomiting that is not controlled by your nausea medication, call the clinic.   BELOW ARE SYMPTOMS THAT SHOULD BE REPORTED IMMEDIATELY:  *FEVER GREATER THAN 100.5 F  *CHILLS WITH OR WITHOUT FEVER  NAUSEA AND VOMITING THAT IS NOT CONTROLLED WITH YOUR NAUSEA MEDICATION  *UNUSUAL SHORTNESS OF BREATH  *UNUSUAL BRUISING OR BLEEDING  TENDERNESS IN MOUTH AND THROAT WITH OR WITHOUT PRESENCE OF ULCERS  *URINARY PROBLEMS  *BOWEL PROBLEMS  UNUSUAL RASH Items with * indicate a potential emergency and should be followed up as soon as possible.  Feel free to call the clinic you have any questions or concerns. The clinic phone number is [phone].  Please show the CHEMO ALERT CARD at check-in to the Emergency Department and triage nurse.    

## 2014-11-14 ENCOUNTER — Telehealth: Payer: Self-pay | Admitting: *Deleted

## 2014-11-14 MED ORDER — ONDANSETRON HCL 4 MG PO TABS: 4.0000 mg | ORAL_TABLET | Freq: Three times a day (TID) | ORAL | Status: DC | PRN

## 2014-11-14 NOTE — Telephone Encounter (Signed)
NOTIFIED CATHY THAT THE PRESCRIPTION WAS SENT TO [facility].

## 2014-11-17 ENCOUNTER — Other Ambulatory Visit: Payer: Self-pay | Admitting: Oncology

## 2014-11-18 ENCOUNTER — Other Ambulatory Visit: Payer: Medicaid Other

## 2014-11-18 ENCOUNTER — Ambulatory Visit: Payer: Medicaid Other | Admitting: Oncology

## 2014-11-18 ENCOUNTER — Telehealth: Payer: Self-pay | Admitting: Oncology

## 2014-11-18 ENCOUNTER — Ambulatory Visit: Payer: Medicaid Other

## 2014-11-18 ENCOUNTER — Telehealth: Payer: Self-pay | Admitting: *Deleted

## 2014-11-18 NOTE — Telephone Encounter (Signed)
Called pt's wife to discuss status of pt and where he was admitted. Pt at [facility], dx with UTI and C.Diff.  Dr. Benay Spice made aware; appts today to be canceled and rescheduled. Urgent POF sent to scheduler.

## 2014-11-18 NOTE — Telephone Encounter (Signed)
Spoke with patient wife and unsure of when he will be out of the hospital and will call back to reschedule.

## 2014-11-20 ENCOUNTER — Telehealth: Payer: Self-pay | Admitting: Nurse Practitioner

## 2014-11-20 NOTE — Telephone Encounter (Signed)
Pt's EC called to r/s pt from 07/05, pt was in the hospital. She confirmed labs/ov .... KJ

## 2014-11-24 ENCOUNTER — Ambulatory Visit (HOSPITAL_BASED_OUTPATIENT_CLINIC_OR_DEPARTMENT_OTHER): Payer: Medicaid Other | Admitting: Oncology

## 2014-11-24 ENCOUNTER — Telehealth: Payer: Self-pay | Admitting: Oncology

## 2014-11-24 ENCOUNTER — Other Ambulatory Visit (HOSPITAL_BASED_OUTPATIENT_CLINIC_OR_DEPARTMENT_OTHER): Payer: Medicaid Other

## 2014-11-24 VITALS — BP 98/73 | HR 101 | Temp 98.7°F | Resp 18 | Ht 78.0 in | Wt 152.8 lb

## 2014-11-24 DIAGNOSIS — D509 Iron deficiency anemia, unspecified: Secondary | ICD-10-CM

## 2014-11-24 DIAGNOSIS — R630 Anorexia: Secondary | ICD-10-CM | POA: Diagnosis not present

## 2014-11-24 DIAGNOSIS — C20 Malignant neoplasm of rectum: Secondary | ICD-10-CM

## 2014-11-24 DIAGNOSIS — R634 Abnormal weight loss: Secondary | ICD-10-CM

## 2014-11-24 LAB — CBC WITH DIFFERENTIAL/PLATELET
BASO%: 1.9 % (ref 0.0–2.0)
BASOS ABS: 0.1 10*3/uL (ref 0.0–0.1)
EOS ABS: 0 10*3/uL (ref 0.0–0.5)
EOS%: 1.3 % (ref 0.0–7.0)
HEMATOCRIT: 34.9 % — AB (ref 38.4–49.9)
HGB: 11.8 g/dL — ABNORMAL LOW (ref 13.0–17.1)
LYMPH%: 5.4 % — ABNORMAL LOW (ref 14.0–49.0)
MCH: 33.1 pg (ref 27.2–33.4)
MCHC: 33.8 g/dL (ref 32.0–36.0)
MCV: 98.1 fL — ABNORMAL HIGH (ref 79.3–98.0)
MONO#: 0.2 10*3/uL (ref 0.1–0.9)
MONO%: 7.9 % (ref 0.0–14.0)
NEUT%: 83.5 % — AB (ref 39.0–75.0)
NEUTROS ABS: 2.3 10*3/uL (ref 1.5–6.5)
Platelets: 113 10*3/uL — ABNORMAL LOW (ref 140–400)
RBC: 3.56 10*6/uL — ABNORMAL LOW (ref 4.20–5.82)
RDW: 23.3 % — ABNORMAL HIGH (ref 11.0–14.6)
WBC: 2.8 10*3/uL — AB (ref 4.0–10.3)
lymph#: 0.2 10*3/uL — ABNORMAL LOW (ref 0.9–3.3)

## 2014-11-24 LAB — COMPREHENSIVE METABOLIC PANEL (CC13)
ALT: 25 U/L (ref 0–55)
AST: 48 U/L — ABNORMAL HIGH (ref 5–34)
Albumin: 3 g/dL — ABNORMAL LOW (ref 3.5–5.0)
Alkaline Phosphatase: 83 U/L (ref 40–150)
Anion Gap: 7 meq/L (ref 3–11)
BUN: 7.6 mg/dL (ref 7.0–26.0)
CO2: 22 meq/L (ref 22–29)
Calcium: 9 mg/dL (ref 8.4–10.4)
Chloride: 106 meq/L (ref 98–109)
Creatinine: 1 mg/dL (ref 0.7–1.3)
EGFR: 83 ml/min/1.73 m2 — ABNORMAL LOW
Glucose: 195 mg/dL — ABNORMAL HIGH (ref 70–140)
Potassium: 4.4 meq/L (ref 3.5–5.1)
Sodium: 135 meq/L — ABNORMAL LOW (ref 136–145)
Total Bilirubin: 0.73 mg/dL (ref 0.20–1.20)
Total Protein: 5.5 g/dL — ABNORMAL LOW (ref 6.4–8.3)

## 2014-11-24 MED ORDER — OXYCODONE HCL 5 MG PO TABS: ORAL_TABLET | ORAL | Status: DC

## 2014-11-24 MED ORDER — ALPRAZOLAM 1 MG PO TABS: 1.0000 mg | ORAL_TABLET | Freq: Every evening | ORAL | Status: DC | PRN

## 2014-11-24 NOTE — Progress Notes (Addendum)
[place] OFFICE PROGRESS NOTE   Diagnosis: Rectal cancer  INTERVAL HISTORY:   Grant [name] returns as scheduled. He completed another cycle of CAPOX beginning 10/27/2014. At the completion of Xeloda he developed nausea/vomiting and diarrhea. He was admitted to [facility] and diagnosed with a urinary tract infection and C. difficile colitis. He is completing a course of Augmentin and Flagyl. He reports improvement in the diarrhea. He continues to have blood-tinged urine.  He reports improvement in his appetite and energy level since discharge from the hospital 11/20/2014.  No change in the foot numbness. He continues to have pain in the sacral area and takes oxycodone. He has noted increased numbness in the fingers.  Objective:  Vital signs in last 24 hours:  Blood pressure 98/73, pulse 101, temperature 98.7 F (37.1 C), temperature source Oral, resp. rate 18, height 6\' 6"  (1.981 m), weight 152 lb 12.8 oz (69.31 kg), SpO2 100 %.    HEENT: No thrush or ulcers Resp: Lungs clear bilaterally Cardio: Regular rate and rhythm GI: No hepatomegaly, left lower quadrant colostomy Vascular: No leg edema   Portacath/PICC-without erythema  Lab Results:  Lab Results  Component Value Date   WBC 2.8* 11/24/2014   HGB 11.8* 11/24/2014   HCT 34.9* 11/24/2014   MCV 98.1* 11/24/2014   PLT 113* 11/24/2014   NEUTROABS 2.3 11/24/2014    Medications: I have reviewed the patient's current medications.  Assessment/Plan: 1. Rectal cancer-large pelvic mass with a colonoscopy 10/11/2013 confirming an obstructing tumor at 9 cm from the anal verge  CTs of the chest, abdomen, and revealed a large complex pelvic mass, gas in the bladder, loculated gas posterior to the bladder, right hydronephrosis, and small bilateral indeterminant lung nodules.   Initiation of radiation and Xeloda 12/18/2013; completed 01/29/2014.  Restaging CT evaluation at [facility] showed decrease in size of the  rectosigmoid mass from 14.6 x 10.5 cm to 9.5 x 8.6 cm. Post radiation changes. Moderate right hydronephrosis with a double-J ureteral stent in place and mild left hydronephrosis, unchanged. Pulmonary nodules unchanged. Circumferential bladder wall thickening likely secondary to radiation. Rectosigmoid mass abuts the posterior bladder and involvement cannot be excluded.  Cycle 1 Capox beginning 03/13/2014  Cycle 2 CAPOX beginning 04/03/2014.  Cycle 3 CAPOX beginning 04/24/2014.  CT 05/13/2014 confirming a decrease in the rectal mass  Pelvic exoneration at [facility] on 06/24/2014 confirming a ypT4,ypN0 tumor. The anterior and posterior cervical frontal margins were less than 1 mm, tumor involved the bladder and left ureter. Surgically included an APR, cystectomy, ileal conduit, intraoperative radiation, and sacrectomy, and vertical rectus myocutaneous flap  Cycle 1 adjuvant CAPOX 08/25/2014  Cycle 2 adjuvant CAPOX 09/15/2014  Cycle 3 adjuvant CAPOX 10/06/2014  Cycle 4 adjuvant CAPOX 10/27/2014  CT abdomen/pelvis at [facility] 11/16/2014 with chronic right hydronephrosis, nodular collection of soft tissue in the right pelvis consistent with post surgical change, though recurrent disease cannot be excluded 2. Right hydronephrosis secondary to the obstructing pelvic mass, status post placement of a right percutaneous nephrostomy tube followed by a right ureter stent.  Persistent hydronephrosis with a new right kidney cystic lesion on a CT 05/13/2014  Right renal parenchymal abscess and bladder stone confirmed on a cystoscopy 05/13/2014, status post a cystolithalopaxy, right ureter stent exchange  Placement of a percutaneous right renal abscess drain on 05/15/2014  CT abdomen 05/20/2014 showed near-complete drainage of the right renal abscess. The drainage catheter has been removed.  He completed a 6 week course of Levaquin. 3. Microcytic anemia-likely  iron deficiency anemia secondary to bleeding from  the rectal and hematuria  4. Possible colovesical fistula-on the CT 10/09/2013 evaluated by Dr. Alinda Money, followup CT cystogram 10/18/2013 revealed no fistula.  5. Anorexia/weight loss secondary to #1. 6. Indeterminate lung nodules on a CT of the chest 10/09/2013. Stable on CT 03/03/2014. 7. Family history of pancreas and colon cancer. 8. 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 9. History of a Colovesical fistula. 10. Hospitalization 12/10/2013 through 12/12/2013 presenting with diarrhea. CT abdomen/pelvis showed an enlarging rectal mass filling almost the entire pelvis. Air noted in the bladder and renal collecting system. Linear collection of air noted along the anterior wall of the colorectal mass abutting the bladder felt to possibly represent a site of fistulization. Stool noted throughout the right colon. Transferred to [facility]. Neoadjuvant chemotherapy/radiation recommended. 11. Skin toxicity at the perineum related to radiation and Xeloda. 12. Pneumonia on the CT 05/20/2014. He completed a course of Levaquin. 13. Right greater than left leg and foot numbness following the sacrectomy 14. Sacrum pain following the APR/ sacrectomy 15. Enterococcus faecalis urinary tract infection and C. difficile colitis 11/16/2014   Disposition:  Mr. Dia has completed 4 cycles of adjuvant chemotherapy. The most recent cycle of chemotherapy was complicated by C. difficile colitis. He is recovering from an admission with a urinary tract infection, C. difficile colitis, and nausea/vomiting. He has lost weight. We decided to forego the final planned cycle of chemotherapy. Mr. Fendley will complete the course of antibiotics prescribed by the physicians at [facility]. He will return for an office visit and Port-A-Cath flush in 3 weeks. Filled prescription for oxycodone and Xanax today.  Betsy Coder, MD  11/24/2014  1:59 PM

## 2014-11-24 NOTE — Telephone Encounter (Signed)
Pt confirmed labs/ov per 07/11 POF, gave pt AVS and Calendar.... KJ °

## 2014-11-25 LAB — CEA: CEA: 1.8 ng/mL (ref 0.0–5.0)

## 2014-12-08 ENCOUNTER — Ambulatory Visit: Payer: Medicaid Other | Admitting: Nurse Practitioner

## 2014-12-08 ENCOUNTER — Other Ambulatory Visit: Payer: Medicaid Other

## 2014-12-15 ENCOUNTER — Ambulatory Visit (HOSPITAL_BASED_OUTPATIENT_CLINIC_OR_DEPARTMENT_OTHER): Payer: Medicaid Other | Admitting: Nurse Practitioner

## 2014-12-15 ENCOUNTER — Ambulatory Visit (HOSPITAL_BASED_OUTPATIENT_CLINIC_OR_DEPARTMENT_OTHER): Payer: Medicaid Other

## 2014-12-15 ENCOUNTER — Telehealth: Payer: Self-pay | Admitting: Nurse Practitioner

## 2014-12-15 VITALS — BP 128/78 | HR 79 | Temp 98.3°F | Resp 18 | Ht 78.0 in | Wt 158.8 lb

## 2014-12-15 DIAGNOSIS — C20 Malignant neoplasm of rectum: Secondary | ICD-10-CM

## 2014-12-15 DIAGNOSIS — Z95828 Presence of other vascular implants and grafts: Secondary | ICD-10-CM

## 2014-12-15 DIAGNOSIS — D509 Iron deficiency anemia, unspecified: Secondary | ICD-10-CM

## 2014-12-15 DIAGNOSIS — R630 Anorexia: Secondary | ICD-10-CM | POA: Diagnosis not present

## 2014-12-15 DIAGNOSIS — G893 Neoplasm related pain (acute) (chronic): Secondary | ICD-10-CM | POA: Diagnosis not present

## 2014-12-15 DIAGNOSIS — R634 Abnormal weight loss: Secondary | ICD-10-CM | POA: Diagnosis not present

## 2014-12-15 MED ORDER — GABAPENTIN 300 MG PO CAPS
300.0000 mg | ORAL_CAPSULE | Freq: Two times a day (BID) | ORAL | Status: DC
Start: 2014-12-15 — End: 2015-01-20

## 2014-12-15 MED ORDER — SODIUM CHLORIDE 0.9 % IJ SOLN
10.0000 mL | INTRAMUSCULAR | Status: DC | PRN
  Administered 2014-12-15: 10 mL via INTRAVENOUS
  Filled 2014-12-15: qty 10

## 2014-12-15 MED ORDER — HEPARIN SOD (PORK) LOCK FLUSH 100 UNIT/ML IV SOLN
500.0000 [IU] | Freq: Once | INTRAVENOUS | Status: AC
  Administered 2014-12-15: 500 [IU] via INTRAVENOUS
  Filled 2014-12-15: qty 5

## 2014-12-15 MED ORDER — OXYCODONE HCL 5 MG PO TABS: ORAL_TABLET | ORAL | Status: DC

## 2014-12-15 NOTE — Telephone Encounter (Signed)
Appointments made and avs pritned for patient °

## 2014-12-15 NOTE — Patient Instructions (Signed)

## 2014-12-15 NOTE — Progress Notes (Addendum)
[place] OFFICE PROGRESS NOTE   Diagnosis:  Rectal cancer  INTERVAL HISTORY:   Mr. Weathers returns as scheduled. He is feeling much better. Diarrhea has resolved. He has a good appetite. He describes his energy level as "fair". He continues to have pain at the coccyx  radiating into both legs. He is taking 6-8 oxycodone tablets a day with good relief. The numbness in his fingertips is "a little better". He notes some difficulty putting on an earring. He continues to have numbness in both lower legs.  Objective:  Vital signs in last 24 hours:  Blood pressure 128/78, pulse 79, temperature 98.3 F (36.8 C), temperature source Oral, resp. rate 18, height 6\' 6"  (1.981 m), weight 158 lb 12.8 oz (72.031 kg), SpO2 100 %.    HEENT: No thrush or ulcers. Lymphatics: No palpable cervical, supra clavicular, axillary or inguinal lymph nodes. Resp: Lungs clear bilaterally. Cardio: Regular rate and rhythm. GI: Abdomen soft and nontender. No organomegaly. Left lower quadrant colostomy. Vascular: No leg edema. Port-A-Cath without erythema.   Lab Results:  Lab Results  Component Value Date   WBC 2.8* 11/24/2014   HGB 11.8* 11/24/2014   HCT 34.9* 11/24/2014   MCV 98.1* 11/24/2014   PLT 113* 11/24/2014   NEUTROABS 2.3 11/24/2014    Imaging:  No results found.  Medications: I have reviewed the patient's current medications.  Assessment/Plan: 1. Rectal cancer-large pelvic mass with a colonoscopy 10/11/2013 confirming an obstructing tumor at 9 cm from the anal verge  CTs of the chest, abdomen, and revealed a large complex pelvic mass, gas in the bladder, loculated gas posterior to the bladder, right hydronephrosis, and small bilateral indeterminant lung nodules.   Initiation of radiation and Xeloda 12/18/2013; completed 01/29/2014.  Restaging CT evaluation at [facility] showed decrease in size of the rectosigmoid mass from 14.6 x 10.5 cm to 9.5 x 8.6 cm. Post radiation  changes. Moderate right hydronephrosis with a double-J ureteral stent in place and mild left hydronephrosis, unchanged. Pulmonary nodules unchanged. Circumferential bladder wall thickening likely secondary to radiation. Rectosigmoid mass abuts the posterior bladder and involvement cannot be excluded.  Cycle 1 Capox beginning 03/13/2014  Cycle 2 CAPOX beginning 04/03/2014.  Cycle 3 CAPOX beginning 04/24/2014.  CT 05/13/2014 confirming a decrease in the rectal mass  Pelvic exoneration at [facility] on 06/24/2014 confirming a ypT4,ypN0 tumor. The anterior and posterior cervical frontal margins were less than 1 mm, tumor involved the bladder and left ureter. Surgically included an APR, cystectomy, ileal conduit, intraoperative radiation, and sacrectomy, and vertical rectus myocutaneous flap  Cycle 1 adjuvant CAPOX 08/25/2014  Cycle 2 adjuvant CAPOX 09/15/2014  Cycle 3 adjuvant CAPOX 10/06/2014  Cycle 4 adjuvant CAPOX 10/27/2014  CT abdomen/pelvis at [facility] 11/16/2014 with chronic right hydronephrosis, nodular collection of soft tissue in the right pelvis consistent with post surgical change, though recurrent disease cannot be excluded 2. Right hydronephrosis secondary to the obstructing pelvic mass, status post placement of a right percutaneous nephrostomy tube followed by a right ureter stent.  Persistent hydronephrosis with a new right kidney cystic lesion on a CT 05/13/2014  Right renal parenchymal abscess and bladder stone confirmed on a cystoscopy 05/13/2014, status post a cystolithalopaxy, right ureter stent exchange  Placement of a percutaneous right renal abscess drain on 05/15/2014  CT abdomen 05/20/2014 showed near-complete drainage of the right renal abscess. The drainage catheter has been removed.  He completed a 6 week course of Levaquin. 3. Microcytic anemia-likely iron deficiency anemia secondary to bleeding  from the rectal and hematuria  4. Possible colovesical fistula-on the CT  10/09/2013 evaluated by Dr. Alinda Money, followup CT cystogram 10/18/2013 revealed no fistula.  5. Anorexia/weight loss secondary to #1. 6. Indeterminate lung nodules on a CT of the chest 10/09/2013. Stable on CT 03/03/2014. 7. Family history of pancreas and colon cancer. 8. 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 9. History of a Colovesical fistula. 10. Hospitalization 12/10/2013 through 12/12/2013 presenting with diarrhea. CT abdomen/pelvis showed an enlarging rectal mass filling almost the entire pelvis. Air noted in the bladder and renal collecting system. Linear collection of air noted along the anterior wall of the colorectal mass abutting the bladder felt to possibly represent a site of fistulization. Stool noted throughout the right colon. Transferred to [facility]. Neoadjuvant chemotherapy/radiation recommended. 11. Skin toxicity at the perineum related to radiation and Xeloda. 12. Pneumonia on the CT 05/20/2014. He completed a course of Levaquin. 13. Right greater than left leg and foot numbness following the sacrectomy 14. Sacrum pain following the APR/ sacrectomy 15. Enterococcus faecalis urinary tract infection and C. difficile colitis 11/16/2014   Disposition: Mr. Orvis appears improved. The diarrhea has resolved. He continues to have sacral pain following surgery. He will continue oxycodone as needed. He was provided with a new prescription today. He will begin Neurontin 300 mg twice daily. We discussed the potential for sedation. He will contact the office in 2 weeks with an update on his pain. The plan is to titrate the oxycodone if his pain improves with the trial of Neurontin.  He will return for a follow-up visit and Port-A-Cath flush in 6-7 weeks. He will contact the office in the interim as outlined above or with any other problems.  Patient seen with Dr. Benay Spice.    Ned Card ANP/GNP-BC   12/15/2014  12:43 PM  This was a shared visit with Ned Card. Mr. Groeneveld has an improved  performance status. He will begin a trial of Neurontin for neuropathic pain.  Julieanne Manson, M.D.

## 2014-12-29 ENCOUNTER — Telehealth: Payer: Self-pay | Admitting: *Deleted

## 2014-12-29 MED ORDER — ALPRAZOLAM 1 MG PO TABS: 1.0000 mg | ORAL_TABLET | Freq: Two times a day (BID) | ORAL | Status: DC | PRN

## 2014-12-29 NOTE — Telephone Encounter (Signed)
Pt's mother called requesting a refill on Xanax, states patient is taking twice a day vs taking at bedtime. Per Ned Card, okay to refill Xanax 1 mg for BID prn use as needed for anxiety. [facility] Pt appreciated callback regarding refill and directions on how to take Xanax.

## 2014-12-30 ENCOUNTER — Encounter: Payer: Self-pay | Admitting: *Deleted

## 2014-12-30 NOTE — Progress Notes (Signed)
  Oncology Nurse Navigator Documentation    Navigator Encounter Type: Telephone (12/30/14 1535)  Spoke with Grant [name] to follow up on his pain since addition of Gabapentin. Says he has not yet noted a difference--seems to make him feel more "fuzzy headed" but plans to give it more time. Feet still swollen R > L with Lasix for past two weeks, but is getting better. Eating well and he is at his goal weight today of 171lb. Urine in urostomy is occasionally red, but it resolves and clears up. Reports he may be trying to pass kidney stone. Instructed him to call Dr.Borden if urine stays red or output drops.     Barriers/Navigation Needs: No barriers at this time (12/30/14 1535)-Reminded him of his follow up on 02/02/15 with port flush.

## 2015-01-20 ENCOUNTER — Other Ambulatory Visit: Payer: Self-pay | Admitting: *Deleted

## 2015-01-20 DIAGNOSIS — C20 Malignant neoplasm of rectum: Secondary | ICD-10-CM

## 2015-01-20 MED ORDER — GABAPENTIN 300 MG PO CAPS: 300.0000 mg | ORAL_CAPSULE | Freq: Two times a day (BID) | ORAL | Status: DC

## 2015-01-27 ENCOUNTER — Telehealth: Payer: Self-pay | Admitting: *Deleted

## 2015-01-27 NOTE — Telephone Encounter (Signed)
REQUEST PLACED IN DR.SHERRILL'S FOLDER.

## 2015-01-28 ENCOUNTER — Other Ambulatory Visit: Payer: Self-pay | Admitting: *Deleted

## 2015-01-28 DIAGNOSIS — C20 Malignant neoplasm of rectum: Secondary | ICD-10-CM

## 2015-01-28 MED ORDER — OXYCODONE HCL 5 MG PO TABS: ORAL_TABLET | ORAL | Status: DC

## 2015-01-29 NOTE — Telephone Encounter (Signed)
Notified pt that re-fill for oxycodone is ready for p/u.  Pt verbalized understanding and expressed appreciation for call.

## 2015-02-02 ENCOUNTER — Ambulatory Visit: Payer: Medicaid Other

## 2015-02-02 ENCOUNTER — Telehealth: Payer: Self-pay | Admitting: Nurse Practitioner

## 2015-02-02 ENCOUNTER — Ambulatory Visit (HOSPITAL_BASED_OUTPATIENT_CLINIC_OR_DEPARTMENT_OTHER): Payer: Medicaid Other | Admitting: Nurse Practitioner

## 2015-02-02 ENCOUNTER — Ambulatory Visit (HOSPITAL_BASED_OUTPATIENT_CLINIC_OR_DEPARTMENT_OTHER): Payer: Medicaid Other

## 2015-02-02 ENCOUNTER — Other Ambulatory Visit: Payer: Self-pay | Admitting: Nurse Practitioner

## 2015-02-02 VITALS — BP 116/70 | HR 82 | Temp 97.8°F | Resp 18 | Ht 78.0 in | Wt 168.6 lb

## 2015-02-02 VITALS — BP 99/69 | HR 79 | Temp 97.0°F | Resp 16

## 2015-02-02 DIAGNOSIS — R509 Fever, unspecified: Secondary | ICD-10-CM

## 2015-02-02 DIAGNOSIS — C20 Malignant neoplasm of rectum: Secondary | ICD-10-CM

## 2015-02-02 MED ORDER — SODIUM CHLORIDE 0.9 % IJ SOLN
10.0000 mL | INTRAMUSCULAR | Status: DC | PRN
  Administered 2015-02-02 (×2): 10 mL via INTRAVENOUS
  Filled 2015-02-02: qty 10

## 2015-02-02 MED ORDER — ALPRAZOLAM 1 MG PO TABS: 1.0000 mg | ORAL_TABLET | Freq: Two times a day (BID) | ORAL | Status: DC | PRN

## 2015-02-02 MED ORDER — HEPARIN SOD (PORK) LOCK FLUSH 100 UNIT/ML IV SOLN
500.0000 [IU] | Freq: Once | INTRAVENOUS | Status: AC
  Administered 2015-02-02: 500 [IU] via INTRAVENOUS
  Filled 2015-02-02: qty 5

## 2015-02-02 MED ORDER — SODIUM CHLORIDE 0.9 % IJ SOLN
10.0000 mL | INTRAMUSCULAR | Status: DC | PRN
  Filled 2015-02-02: qty 10

## 2015-02-02 NOTE — Telephone Encounter (Signed)
per pof to sch pt appt-gave pt copy of sch °

## 2015-02-02 NOTE — Addendum Note (Signed)
Addended by: Lenox Ponds E on: 02/02/2015 02:17 PM   Modules accepted: Orders, SmartSet

## 2015-02-02 NOTE — Telephone Encounter (Signed)
As per Lattie Haw refill called in to [facility] for Xanax

## 2015-02-02 NOTE — Telephone Encounter (Signed)
per pof to sch pt appt-gave pt copy of avs °

## 2015-02-02 NOTE — Progress Notes (Signed)
[place] OFFICE PROGRESS NOTE   Diagnosis:  Rectal cancer  INTERVAL HISTORY:   Grant [name] returns as scheduled. Pain is unchanged. He noted no improvement with Neurontin. No diarrhea. For the past week he has been having intermittent fevers to 101. His temperature was 101 this morning. He took ibuprofen. No shaking chills. No shortness of breath or cough. No redness or pain associated with the Port-A-Cath. He reports a good appetite. He is gaining weight.  Objective:  Vital signs in last 24 hours:  Blood pressure 116/70, pulse 82, temperature 97.8 F (36.6 C), temperature source Oral, resp. rate 18, height 6\' 6"  (1.981 m), weight 168 lb 9.6 oz (76.476 kg), SpO2 100 %.    HEENT: No thrush or ulcers. Resp: Lungs clear bilaterally. No wheezes or rales. Cardio: Regular rate and rhythm. GI: Abdomen soft and nontender. No hepatomegaly. Left lower quadrant colostomy. Right abdomen urostomy. Vascular: No leg edema. Port-A-Cath without erythema.   Lab Results:  Lab Results  Component Value Date   WBC 2.8* 11/24/2014   HGB 11.8* 11/24/2014   HCT 34.9* 11/24/2014   MCV 98.1* 11/24/2014   PLT 113* 11/24/2014   NEUTROABS 2.3 11/24/2014    Imaging:  No results found.  Medications: I have reviewed the patient's current medications.  Assessment/Plan: 1. Rectal cancer-large pelvic mass with a colonoscopy 10/11/2013 confirming an obstructing tumor at 9 cm from the anal verge  CTs of the chest, abdomen, and revealed a large complex pelvic mass, gas in the bladder, loculated gas posterior to the bladder, right hydronephrosis, and small bilateral indeterminant lung nodules.   Initiation of radiation and Xeloda 12/18/2013; completed 01/29/2014.  Restaging CT evaluation at [facility] showed decrease in size of the rectosigmoid mass from 14.6 x 10.5 cm to 9.5 x 8.6 cm. Post radiation changes. Moderate right hydronephrosis with a double-J ureteral stent in place and mild  left hydronephrosis, unchanged. Pulmonary nodules unchanged. Circumferential bladder wall thickening likely secondary to radiation. Rectosigmoid mass abuts the posterior bladder and involvement cannot be excluded.  Cycle 1 Capox beginning 03/13/2014  Cycle 2 CAPOX beginning 04/03/2014.  Cycle 3 CAPOX beginning 04/24/2014.  CT 05/13/2014 confirming a decrease in the rectal mass  Pelvic exoneration at [facility] on 06/24/2014 confirming a ypT4,ypN0 tumor. The anterior and posterior cervical frontal margins were less than 1 mm, tumor involved the bladder and left ureter. Surgically included an APR, cystectomy, ileal conduit, intraoperative radiation, and sacrectomy, and vertical rectus myocutaneous flap  Cycle 1 adjuvant CAPOX 08/25/2014  Cycle 2 adjuvant CAPOX 09/15/2014  Cycle 3 adjuvant CAPOX 10/06/2014  Cycle 4 adjuvant CAPOX 10/27/2014  CT abdomen/pelvis at [facility] 11/16/2014 with chronic right hydronephrosis, nodular collection of soft tissue in the right pelvis consistent with post surgical change, though recurrent disease cannot be excluded 2. Right hydronephrosis secondary to the obstructing pelvic mass, status post placement of a right percutaneous nephrostomy tube followed by a right ureter stent.  Persistent hydronephrosis with a new right kidney cystic lesion on a CT 05/13/2014  Right renal parenchymal abscess and bladder stone confirmed on a cystoscopy 05/13/2014, status post a cystolithalopaxy, right ureter stent exchange  Placement of a percutaneous right renal abscess drain on 05/15/2014  CT abdomen 05/20/2014 showed near-complete drainage of the right renal abscess. The drainage catheter has been removed.  He completed a 6 week course of Levaquin. 3. Microcytic anemia-likely iron deficiency anemia secondary to bleeding from the rectal and hematuria  4. Possible colovesical fistula-on the CT 10/09/2013 evaluated by Dr.  Borden, followup CT cystogram 10/18/2013 revealed no fistula.   5. Anorexia/weight loss secondary to #1. 6. Indeterminate lung nodules on a CT of the chest 10/09/2013. Stable on CT 03/03/2014. 7. Family history of pancreas and colon cancer. 8. 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 9. History of a Colovesical fistula. 10. Hospitalization 12/10/2013 through 12/12/2013 presenting with diarrhea. CT abdomen/pelvis showed an enlarging rectal mass filling almost the entire pelvis. Air noted in the bladder and renal collecting system. Linear collection of air noted along the anterior wall of the colorectal mass abutting the bladder felt to possibly represent a site of fistulization. Stool noted throughout the right colon. Transferred to [facility]. Neoadjuvant chemotherapy/radiation recommended. 11. Skin toxicity at the perineum related to radiation and Xeloda. 12. Pneumonia on the CT 05/20/2014. He completed a course of Levaquin. 13. Right greater than left leg and foot numbness following the sacrectomy 14. Sacrum pain following the APR/ sacrectomy 15. Enterococcus faecalis urinary tract infection and C. difficile colitis 11/16/2014    Disposition: Mr. Blumstein appears stable. He continues to have sacral pain. He is taking oxycodone as needed. He noted no improvement with Neurontin. We decided to discontinue the Neurontin.  He has been having intermittent fevers over the past one week. No localizing source for infection. We will obtain a blood culture from the Port-A-Cath today.  He is scheduled for follow-up and CT scans at [facility] next week. We scheduled a return visit here in 3 weeks. He will contact the office in the interim with any problems. We specifically discussed fever and or other signs of infection.  Plan reviewed with Dr. Benay Spice.    Ned Card ANP/GNP-BC   02/02/2015  12:34 PM

## 2015-02-06 ENCOUNTER — Telehealth: Payer: Self-pay | Admitting: *Deleted

## 2015-02-06 NOTE — Telephone Encounter (Signed)
Patient wife called stating that "information" needs to be sent to [facility] so that ostomy supplies can be paid for. Patient wife can not recall what type of information needs to be sent. She states that information pertaining to that request was put in MD Sherrill's folder. Message sent RN Amy Horton.

## 2015-02-06 NOTE — Telephone Encounter (Signed)
Faxed order for ostomy supplies per Ned Card, NP

## 2015-02-08 LAB — CULTURE, BLOOD (SINGLE)

## 2015-02-17 ENCOUNTER — Encounter: Payer: Self-pay | Admitting: Oncology

## 2015-02-17 NOTE — Progress Notes (Signed)
I placed form on desk of nurse for dr. Benay Spice or Ned Card. NCCmn for not complete.

## 2015-02-20 ENCOUNTER — Telehealth: Payer: Self-pay | Admitting: *Deleted

## 2015-02-20 NOTE — Telephone Encounter (Signed)
Pt wife called stating pt had CT done at [facility] last week, and ended up having biopsy of right kidney on 9/30.  Pt has not heard any results, does not know if Dr. Benay Spice has received results either.  Pt has MD visit on Monday, and does not know whether or not to keep apt if Dr. Benay Spice does not have results.  Please follow up [phone].

## 2015-02-20 NOTE — Telephone Encounter (Signed)
Keep appt., we will f/u on results

## 2015-02-23 ENCOUNTER — Ambulatory Visit: Payer: Medicaid Other | Admitting: Nurse Practitioner

## 2015-02-23 ENCOUNTER — Telehealth: Payer: Self-pay | Admitting: *Deleted

## 2015-02-23 ENCOUNTER — Telehealth: Payer: Self-pay | Admitting: Nurse Practitioner

## 2015-02-23 ENCOUNTER — Other Ambulatory Visit: Payer: Self-pay | Admitting: *Deleted

## 2015-02-23 ENCOUNTER — Other Ambulatory Visit: Payer: Medicaid Other

## 2015-02-23 NOTE — Telephone Encounter (Signed)
Cancel appointments per pof  anne

## 2015-02-23 NOTE — Telephone Encounter (Signed)
Received call from pt's significant other, Tye Maryland, stating pt's biopsy from [facility] on 9/30 was positive, and surgeon there is advising our appts for today are canceled; Tye Maryland states she will call us with any updates or to reschedule as needed; encouraged her to call us if we could do anything else for her and she voiced understanding. POF in to cancel today's appts.

## 2015-03-03 ENCOUNTER — Telehealth: Payer: Self-pay | Admitting: *Deleted

## 2015-03-09 NOTE — Telephone Encounter (Signed)
Opened in error

## 2015-03-31 ENCOUNTER — Telehealth: Payer: Self-pay | Admitting: Oncology

## 2015-03-31 ENCOUNTER — Other Ambulatory Visit: Payer: Self-pay | Admitting: Nurse Practitioner

## 2015-03-31 NOTE — Telephone Encounter (Signed)
s.w. pt wife and advised on NOV appt....Marland Kitchenok adn aware

## 2015-04-06 ENCOUNTER — Ambulatory Visit (HOSPITAL_BASED_OUTPATIENT_CLINIC_OR_DEPARTMENT_OTHER): Payer: Medicaid Other | Admitting: Oncology

## 2015-04-06 ENCOUNTER — Telehealth: Payer: Self-pay | Admitting: Oncology

## 2015-04-06 VITALS — BP 99/70 | HR 117 | Temp 98.8°F | Resp 18 | Ht 78.0 in | Wt 152.9 lb

## 2015-04-06 DIAGNOSIS — C20 Malignant neoplasm of rectum: Secondary | ICD-10-CM | POA: Diagnosis present

## 2015-04-06 NOTE — Telephone Encounter (Signed)
per pof to sch pt appt-gave pt copy of avs °

## 2015-04-06 NOTE — Progress Notes (Signed)
[place] OFFICE PROGRESS NOTE   Diagnosis: Rectal cancer  INTERVAL HISTORY:   Grant [name] was seen for follow-up in urology at [facility] on 02/09/2015. A CT revealed an increase in the size of a right pulmonary nodule, increased soft tissue density in the right renal pelvis and right proximal ureter with increased right hydronephrosis. 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  He returned to surgical oncology 03/05/2015. A PET scan revealed a solitary focus of increased FDG activity at the tip of the remaining sacrum, a large irregular right kidney, low-density gas and fluid collection in the inferior right hepatic lobe concerning for abscess, and multiple pulmonary metastases.  He was admitted and underwent drainage of the hepatic abscess. He was discharged 03/23/2015 and completed a course of antibiotics yesterday.  He continues to feel "weak ", though he is regaining his appetite and energy level following treatment of the hepatic abscess. He has pain at the sacrum and in the legs. He takes oxycodone for relief of pain. He continues to have numbness in the fingertips.   Objective:  Vital signs in last 24 hours:  Blood pressure 99/70, pulse 117, temperature 98.8 F (37.1 C), temperature source Oral, resp. rate 18, height 6\' 6"  (1.981 m), weight 152 lb 14.4 oz (69.355 kg), SpO2 100 %.    HEENT: Neck without mass Lymphatics: No cervical, supraclavicular, axillary, or inguinal nodes Resp: Lungs clear bilaterally Cardio: Regular rate and rhythm GI: No hepatomegaly, left lower quadrant colostomy with Brown stool, right lower quadrant urostomy Vascular: No leg edema  Skin: Hyperpigmentation at the sacrum with an area of scabbing over the lower sacrum , tender over the sacrum  Portacath/PICC-without erythema  Lab Results:  Labs  at [facility] 03/20/2015: Hemoglobin 9.4, platelets 239,000 Imaging:  As per history of present illness, images not available for review today  Medications: I have reviewed the patient's current medications.  Assessment/Plan: 1. Rectal cancer-large pelvic mass with a colonoscopy 10/11/2013 confirming an obstructing tumor at 9 cm from the anal verge  CTs of the chest, abdomen, and revealed a large complex pelvic mass, gas in the bladder, loculated gas posterior to the bladder, right hydronephrosis, and small bilateral indeterminant lung nodules.   Initiation of radiation and Xeloda 12/18/2013; completed 01/29/2014.  Restaging CT evaluation at [facility] showed decrease in size of the rectosigmoid mass from 14.6 x 10.5 cm to 9.5 x 8.6 cm. Post radiation changes. Moderate right hydronephrosis with a double-J ureteral stent in place and mild left hydronephrosis, unchanged. Pulmonary nodules unchanged. Circumferential bladder wall thickening likely secondary to radiation. Rectosigmoid mass abuts the posterior bladder and involvement cannot be excluded.  Cycle 1 Capox beginning 03/13/2014  Cycle 2 CAPOX beginning 04/03/2014.  Cycle 3 CAPOX beginning 04/24/2014.  CT 05/13/2014 confirming a decrease in the rectal mass  Pelvic exoneration at [facility] on 06/24/2014 confirming a ypT4,ypN0 tumor. The anterior and posterior cervical frontal margins were less than 1 mm, tumor involved the bladder and left ureter. Surgically included an APR, cystectomy, ileal conduit, intraoperative radiation, and sacrectomy, and vertical rectus myocutaneous flap  Cycle 1 adjuvant CAPOX 08/25/2014  Cycle 2 adjuvant CAPOX 09/15/2014  Cycle 3 adjuvant CAPOX 10/06/2014  Cycle 4 adjuvant CAPOX 10/27/2014  CT abdomen/pelvis at [facility] 11/16/2014 with chronic right hydronephrosis, nodular collection of soft tissue in the right pelvis consistent with post surgical change, though recurrent disease cannot be excluded  CT at [facility] 02/09/2015  with increased fullness of the right renal pelvis and enlargement of a lung nodule  Biopsy of the right renal pelvis 02/13/2015 confirmed metastatic colorectal cancer  Staging PET scan 03/05/2015 consistent with multiple lung metastases, a hepatic abscess, and increased FDG uptake at the tip of the sacrum 2. Right hydronephrosis secondary to the obstructing pelvic mass, status post placement of a right percutaneous nephrostomy tube followed by a right ureter stent.  Persistent hydronephrosis with a new right kidney cystic lesion on a CT 05/13/2014  Right renal parenchymal abscess and bladder stone confirmed on a cystoscopy 05/13/2014, status post a cystolithalopaxy, right ureter stent exchange  Placement of a percutaneous right renal abscess drain on 05/15/2014  CT abdomen 05/20/2014 showed near-complete drainage of the right renal abscess. The drainage catheter has been removed.  He completed a 6 week course of Levaquin. 3. Microcytic anemia-likely iron deficiency anemia secondary to bleeding from the rectal and hematuria  4. Possible colovesical fistula-on the CT 10/09/2013 evaluated by Dr. Alinda Money, followup CT cystogram 10/18/2013 revealed no fistula.  5. Anorexia/weight loss secondary to #1. 6. Indeterminate lung nodules on a CT of the chest 10/09/2013. Stable on CT 03/03/2014. 7. Family history of pancreas and colon cancer. 8. 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 9. History of a Colovesical fistula. 10. Hospitalization 12/10/2013 through 12/12/2013 presenting with diarrhea. CT abdomen/pelvis showed an enlarging rectal mass filling almost the entire pelvis. Air noted in the bladder and renal collecting system. Linear collection of air noted along the anterior wall of the colorectal mass abutting the bladder felt to possibly represent a site of fistulization. Stool noted throughout the right colon. Transferred to [facility]. Neoadjuvant chemotherapy/radiation recommended. 11. Skin toxicity at the  perineum related to radiation and Xeloda. 12. Pneumonia on the CT 05/20/2014. He completed a course of Levaquin. 13. Right greater than left leg and foot numbness following the sacrectomy 14. Sacrum pain following the APR/ sacrectomy, persistent pain in the sacrum with increased FDG activity at the tip of the sacrum on a PET scan 03/05/2015 15. Enterococcus faecalis urinary tract infection and C. difficile colitis 11/16/2014 16. Hepatic abscess on the PET scan 03/05/2015-status post drainage followed by a course of antibiotics     Disposition:  Grant [name] has been diagnosed with metastatic rectal cancer. He appears to have missed a disease involving the right kidney, I lateral lung nodules, and potentially the sacrum. No therapy will be curative. I discussed the prognosis and treatment options with Grant [name] today. He has been treated with oxaliplatin based therapy and has persistent neuropathy symptoms. I recommend FOLFIRI chemotherapy. I reviewed the FOLFIRI regimen and toxicities associated with irinotecan in detail with Grant [name] today. We will submit his tumor for Foundation1 testing to decide on the indication for biologic therapy.  He would like to wait on beginning systemic therapy until after a follow-up visit in 2 weeks. I asked him to bring in the [facility] images on a disc so we can load the into the [facility] system.    Betsy Coder, MD  04/06/2015  10:11 AM

## 2015-04-08 ENCOUNTER — Inpatient Hospital Stay
Admission: RE | Admit: 2015-04-08 | Discharge: 2015-04-08 | Disposition: A | Discharge status: Home / Self Care | Payer: Self-pay | Source: Ambulatory Visit | Attending: Oncology | Admitting: Oncology

## 2015-04-08 ENCOUNTER — Other Ambulatory Visit: Payer: Self-pay | Admitting: Oncology

## 2015-04-08 DIAGNOSIS — C801 Malignant (primary) neoplasm, unspecified: Secondary | ICD-10-CM

## 2015-04-21 ENCOUNTER — Encounter: Payer: Self-pay | Admitting: *Deleted

## 2015-04-21 ENCOUNTER — Other Ambulatory Visit: Payer: Self-pay | Admitting: *Deleted

## 2015-04-21 DIAGNOSIS — C20 Malignant neoplasm of rectum: Secondary | ICD-10-CM | POA: Insufficient documentation

## 2015-04-21 DIAGNOSIS — C7800 Secondary malignant neoplasm of unspecified lung: Principal | ICD-10-CM

## 2015-04-21 NOTE — Progress Notes (Signed)
Email request to [facility]ology for Foundation One Testing on following case:  Patient: Grant [name], Grant [name] Collected: 10/11/2013 Client: [place] Accession: [id number] Received: 10/11/2013 [name] DOB: June 18, 1955 Age: 59 Gender: M Reported: 10/14/2013 [address] Patient Ph: [phone] MRN #: [id number] [place], [place] [address] Visit #: [id number] Chart #: Phone: [phone]

## 2015-04-22 ENCOUNTER — Telehealth: Payer: Self-pay | Admitting: Oncology

## 2015-04-22 ENCOUNTER — Ambulatory Visit (HOSPITAL_BASED_OUTPATIENT_CLINIC_OR_DEPARTMENT_OTHER): Payer: Medicaid Other | Admitting: Oncology

## 2015-04-22 ENCOUNTER — Other Ambulatory Visit: Payer: Self-pay | Admitting: *Deleted

## 2015-04-22 ENCOUNTER — Other Ambulatory Visit (HOSPITAL_COMMUNITY)
Admission: RE | Admit: 2015-04-22 | Discharge: 2015-04-22 | Disposition: A | Discharge status: Home / Self Care | Payer: Medicaid Other | Source: Ambulatory Visit | Attending: Oncology | Admitting: Oncology

## 2015-04-22 ENCOUNTER — Other Ambulatory Visit (HOSPITAL_BASED_OUTPATIENT_CLINIC_OR_DEPARTMENT_OTHER): Payer: Medicaid Other

## 2015-04-22 ENCOUNTER — Ambulatory Visit (HOSPITAL_BASED_OUTPATIENT_CLINIC_OR_DEPARTMENT_OTHER): Payer: Medicaid Other

## 2015-04-22 ENCOUNTER — Encounter: Payer: Self-pay | Admitting: *Deleted

## 2015-04-22 VITALS — BP 104/60 | HR 98 | Temp 99.4°F | Resp 18 | Ht 78.0 in | Wt 149.9 lb

## 2015-04-22 DIAGNOSIS — C20 Malignant neoplasm of rectum: Secondary | ICD-10-CM | POA: Diagnosis present

## 2015-04-22 DIAGNOSIS — C7800 Secondary malignant neoplasm of unspecified lung: Secondary | ICD-10-CM | POA: Insufficient documentation

## 2015-04-22 DIAGNOSIS — R634 Abnormal weight loss: Secondary | ICD-10-CM

## 2015-04-22 DIAGNOSIS — R630 Anorexia: Secondary | ICD-10-CM

## 2015-04-22 DIAGNOSIS — G893 Neoplasm related pain (acute) (chronic): Secondary | ICD-10-CM | POA: Diagnosis not present

## 2015-04-22 DIAGNOSIS — Z95828 Presence of other vascular implants and grafts: Secondary | ICD-10-CM

## 2015-04-22 LAB — CBC WITH DIFFERENTIAL/PLATELET
BASO%: 0.3 % (ref 0.0–2.0)
Basophils Absolute: 0 10*3/uL (ref 0.0–0.1)
EOS ABS: 0 10*3/uL (ref 0.0–0.5)
EOS%: 0.2 % (ref 0.0–7.0)
HEMATOCRIT: 29.1 % — AB (ref 38.4–49.9)
HGB: 9.6 g/dL — ABNORMAL LOW (ref 13.0–17.1)
LYMPH#: 0.2 10*3/uL — AB (ref 0.9–3.3)
LYMPH%: 3.2 % — AB (ref 14.0–49.0)
MCH: 26.5 pg — ABNORMAL LOW (ref 27.2–33.4)
MCHC: 32.9 g/dL (ref 32.0–36.0)
MCV: 80.5 fL (ref 79.3–98.0)
MONO#: 0.5 10*3/uL (ref 0.1–0.9)
MONO%: 6.4 % (ref 0.0–14.0)
NEUT%: 89.9 % — AB (ref 39.0–75.0)
NEUTROS ABS: 6.9 10*3/uL — AB (ref 1.5–6.5)
PLATELETS: 221 10*3/uL (ref 140–400)
RBC: 3.61 10*6/uL — AB (ref 4.20–5.82)
RDW: 19 % — ABNORMAL HIGH (ref 11.0–14.6)
WBC: 7.6 10*3/uL (ref 4.0–10.3)

## 2015-04-22 LAB — COMPREHENSIVE METABOLIC PANEL
ALT: <9 U/L (ref 0–55)
ANION GAP: 10 meq/L (ref 3–11)
AST: 10 U/L (ref 5–34)
Albumin: 3.1 g/dL — ABNORMAL LOW (ref 3.5–5.0)
Alkaline Phosphatase: 75 U/L (ref 40–150)
BUN: 19.3 mg/dL (ref 7.0–26.0)
CHLORIDE: 99 meq/L (ref 98–109)
CO2: 25 meq/L (ref 22–29)
Calcium: 9.7 mg/dL (ref 8.4–10.4)
Creatinine: 1.1 mg/dL (ref 0.7–1.3)
EGFR: 75 mL/min/{1.73_m2} — AB (ref >90)
GLUCOSE: 124 mg/dL (ref 70–140)
POTASSIUM: 5 meq/L (ref 3.5–5.1)
SODIUM: 134 meq/L — AB (ref 136–145)
TOTAL PROTEIN: 7.7 g/dL (ref 6.4–8.3)
Total Bilirubin: 0.71 mg/dL (ref 0.20–1.20)

## 2015-04-22 LAB — CEA: CEA: 5.5 ng/mL — ABNORMAL HIGH (ref 0.0–5.0)

## 2015-04-22 MED ORDER — METHOCARBAMOL 500 MG PO TABS: 250.0000 mg | ORAL_TABLET | Freq: Three times a day (TID) | ORAL | Status: DC

## 2015-04-22 MED ORDER — HEPARIN SOD (PORK) LOCK FLUSH 100 UNIT/ML IV SOLN
500.0000 [IU] | Freq: Once | INTRAVENOUS | Status: AC
  Administered 2015-04-22: 500 [IU] via INTRAVENOUS
  Filled 2015-04-22: qty 5

## 2015-04-22 MED ORDER — OXYCODONE HCL 5 MG PO TABS: ORAL_TABLET | ORAL | Status: DC

## 2015-04-22 MED ORDER — OXYCODONE-ACETAMINOPHEN 5-325 MG PO TABS
ORAL_TABLET | ORAL | Status: AC
  Filled 2015-04-22: qty 2

## 2015-04-22 MED ORDER — OXYCODONE-ACETAMINOPHEN 5-325 MG PO TABS
2.0000 | ORAL_TABLET | Freq: Once | ORAL | Status: AC
  Administered 2015-04-22: 2 via ORAL

## 2015-04-22 MED ORDER — SODIUM CHLORIDE 0.9 % IJ SOLN
10.0000 mL | INTRAMUSCULAR | Status: DC | PRN
Start: 2015-04-22 — End: 2015-04-22
  Administered 2015-04-22: 10 mL via INTRAVENOUS
  Filled 2015-04-22: qty 10

## 2015-04-22 NOTE — Telephone Encounter (Signed)
per pof to sch pt appt-gave pt copy of avs °

## 2015-04-22 NOTE — Progress Notes (Signed)
Oncology Nurse Navigator Documentation  Oncology Nurse Navigator Flowsheets 04/22/2015  Navigator Encounter Type 6 month  Patient Visit Type Medonc  Treatment Phase Abnormal Scans  Barriers/Navigation Needs Family concerns--wound care  Interventions Other-provided script for Restore 4 X 4 hydrocolloid dressings  Time Spent with Patient 15  Has 1.5 cm stage II sacral decubitus. Instructed on use of hydrocolloidal dressing. Can purchase at medical supply and some pharmacies. Instructed to try to reduce pressure on this area by turning frequently, increase protein in diet.

## 2015-04-22 NOTE — Patient Instructions (Signed)

## 2015-04-22 NOTE — Progress Notes (Signed)
Grant [name] OFFICE PROGRESS NOTE   Diagnosis: Rectal cancer  INTERVAL HISTORY:   Grant [name] returns as scheduled. He continues to have pain in the sacrum and legs. He has developed a "sore "at the sacrum. He takes oxycodone every 4 hours and Robaxin 4 times per day. He would like to wait until next month to begin chemotherapy.  Objective:  Vital signs in last 24 hours:  Blood pressure 104/60, pulse 98, temperature 99.4 F (37.4 C), temperature source Oral, resp. rate 18, height 6\' 6"  (1.981 m), weight 149 lb 14.4 oz (67.994 kg), SpO2 100 %.   Resp: Distant breath sounds, no respiratory distress Cardio: Distant heart sounds, regular rate and rhythm GI: No hepatomegaly, right lower quadrant urostomy, left lower quadrant colostomy Vascular: No leg edema  Skin: There are several early-stage pressure ulcers at the sacrum, one of the 3 less than 2 cm areas is scabbed over   Portacath/PICC-without erythema  Lab Results:  Lab Results  Component Value Date   WBC 7.6 04/22/2015   HGB 9.6* 04/22/2015   HCT 29.1* 04/22/2015   MCV 80.5 04/22/2015   PLT 221 04/22/2015   NEUTROABS 6.9* 04/22/2015    Medications: I have reviewed the patient's current medications.  Assessment/Plan: 1. Rectal cancer-large pelvic mass with a colonoscopy 10/11/2013 confirming an obstructing tumor at 9 cm from the anal verge  CTs of the chest, abdomen, and revealed a large complex pelvic mass, gas in the bladder, loculated gas posterior to the bladder, right hydronephrosis, and small bilateral indeterminant lung nodules.   Initiation of radiation and Xeloda 12/18/2013; completed 01/29/2014.  Restaging CT evaluation at [facility] showed decrease in size of the rectosigmoid mass from 14.6 x 10.5 cm to 9.5 x 8.6 cm. Post radiation changes. Moderate right hydronephrosis with a double-J ureteral stent in place and mild left hydronephrosis, unchanged. Pulmonary nodules unchanged. Circumferential  bladder wall thickening likely secondary to radiation. Rectosigmoid mass abuts the posterior bladder and involvement cannot be excluded.  Cycle 1 Capox beginning 03/13/2014  Cycle 2 CAPOX beginning 04/03/2014.  Cycle 3 CAPOX beginning 04/24/2014.  CT 05/13/2014 confirming a decrease in the rectal mass  Pelvic exoneration at [facility] on 06/24/2014 confirming a ypT4,ypN0 tumor. The anterior and posterior cervical frontal margins were less than 1 mm, tumor involved the bladder and left ureter. Surgically included an APR, cystectomy, ileal conduit, intraoperative radiation, and sacrectomy, and vertical rectus myocutaneous flap  Cycle 1 adjuvant CAPOX 08/25/2014  Cycle 2 adjuvant CAPOX 09/15/2014  Cycle 3 adjuvant CAPOX 10/06/2014  Cycle 4 adjuvant CAPOX 10/27/2014  CT abdomen/pelvis at [facility] 11/16/2014 with chronic right hydronephrosis, nodular collection of soft tissue in the right pelvis consistent with post surgical change, though recurrent disease cannot be excluded  CT at [facility] 02/09/2015 with increased fullness of the right renal pelvis and enlargement of a lung nodule  Biopsy of the right renal pelvis 02/13/2015 confirmed metastatic colorectal cancer  Staging PET scan 03/05/2015 consistent with multiple lung metastases, a hepatic abscess, and increased FDG uptake at the tip of the sacrum 2. Right hydronephrosis secondary to the obstructing pelvic mass, status post placement of a right percutaneous nephrostomy tube followed by a right ureter stent.  Persistent hydronephrosis with a new right kidney cystic lesion on a CT 05/13/2014  Right renal parenchymal abscess and bladder stone confirmed on a cystoscopy 05/13/2014, status post a cystolithalopaxy, right ureter stent exchange  Placement of a percutaneous right renal abscess drain on 05/15/2014  CT abdomen 05/20/2014 showed near-complete  drainage of the right renal abscess. The drainage catheter has been removed.  He completed a 6 week  course of Levaquin. 3. Microcytic anemia-likely iron deficiency anemia secondary to bleeding from the rectal and hematuria  4. Possible colovesical fistula-on the CT 10/09/2013 evaluated by Dr. Alinda Money, followup CT cystogram 10/18/2013 revealed no fistula.  5. Anorexia/weight loss secondary to #1. 6. Indeterminate lung nodules on a CT of the chest 10/09/2013. Stable on CT 03/03/2014. 7. Family history of pancreas and colon cancer. 8. 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 9. History of a Colovesical fistula. 10. Hospitalization 12/10/2013 through 12/12/2013 presenting with diarrhea. CT abdomen/pelvis showed an enlarging rectal mass filling almost the entire pelvis. Air noted in the bladder and renal collecting system. Linear collection of air noted along the anterior wall of the colorectal mass abutting the bladder felt to possibly represent a site of fistulization. Stool noted throughout the right colon. Transferred to [facility]. Neoadjuvant chemotherapy/radiation recommended. 11. Skin toxicity at the perineum related to radiation and Xeloda. 12. Pneumonia on the CT 05/20/2014. He completed a course of Levaquin. 13. Right greater than left leg and foot numbness following the sacrectomy 14. Sacrum pain following the APR/ sacrectomy, persistent pain in the sacrum with increased FDG activity at the tip of the sacrum on a PET scan 03/05/2015 15. Enterococcus faecalis urinary tract infection and C. difficile colitis 11/16/2014 16. Hepatic abscess on the PET scan 03/05/2015-status post drainage followed by a course of antibiotics  Disposition:  Grant [name] appears unchanged. He has metastatic rectal cancer. He has a borderline performance status to begin systemic therapy. We refilled his prescription for oxycodone and Robaxin today. We gave him a prescription for Restore to place on the decubitus ulcers. He will return for an office visit to discuss FOLFIRI chemotherapy in 4 weeks. We are waiting on results of  Foundation 1 testing.  Betsy Coder, MD  04/22/2015  11:18 AM

## 2015-04-22 NOTE — Progress Notes (Signed)
Called Grant [name] back to follow up on his pain s/p administration of Percocet in office. Says pain went from 8 to 5/10, which is best as it gets. Inquired if this was tolerable for him? He states he would like to be pain free. Again reinforced that adding long acting narcotic would help this. Says he is not ready for that right now. His girlfriend is checking with pharmacy for dressing.

## 2015-05-01 ENCOUNTER — Telehealth: Payer: Self-pay

## 2015-05-01 ENCOUNTER — Other Ambulatory Visit: Payer: Self-pay | Admitting: *Deleted

## 2015-05-01 DIAGNOSIS — C20 Malignant neoplasm of rectum: Secondary | ICD-10-CM

## 2015-05-01 MED ORDER — ALPRAZOLAM 1 MG PO TABS: 1.0000 mg | ORAL_TABLET | Freq: Two times a day (BID) | ORAL | Status: DC | PRN

## 2015-05-01 NOTE — Telephone Encounter (Signed)
Returning pt call. Yes the [facility]anax has been called in [facility]

## 2015-05-01 NOTE — Telephone Encounter (Signed)
Cathy called requesting Xanax refill called into [facility] on [address] In [place].

## 2015-05-07 ENCOUNTER — Encounter (HOSPITAL_COMMUNITY): Payer: Self-pay

## 2015-05-20 ENCOUNTER — Other Ambulatory Visit (HOSPITAL_BASED_OUTPATIENT_CLINIC_OR_DEPARTMENT_OTHER): Payer: Medicaid Other

## 2015-05-20 ENCOUNTER — Ambulatory Visit (HOSPITAL_BASED_OUTPATIENT_CLINIC_OR_DEPARTMENT_OTHER): Payer: Medicaid Other

## 2015-05-20 ENCOUNTER — Telehealth: Payer: Self-pay | Admitting: *Deleted

## 2015-05-20 ENCOUNTER — Telehealth: Payer: Self-pay | Admitting: Oncology

## 2015-05-20 ENCOUNTER — Ambulatory Visit (HOSPITAL_BASED_OUTPATIENT_CLINIC_OR_DEPARTMENT_OTHER): Payer: Medicaid Other | Admitting: Oncology

## 2015-05-20 VITALS — BP 126/77 | HR 109 | Temp 98.6°F | Resp 18 | Ht 78.0 in | Wt 146.0 lb

## 2015-05-20 DIAGNOSIS — R630 Anorexia: Secondary | ICD-10-CM

## 2015-05-20 DIAGNOSIS — G893 Neoplasm related pain (acute) (chronic): Secondary | ICD-10-CM

## 2015-05-20 DIAGNOSIS — C7800 Secondary malignant neoplasm of unspecified lung: Secondary | ICD-10-CM | POA: Diagnosis not present

## 2015-05-20 DIAGNOSIS — D509 Iron deficiency anemia, unspecified: Secondary | ICD-10-CM | POA: Diagnosis not present

## 2015-05-20 DIAGNOSIS — Z95828 Presence of other vascular implants and grafts: Secondary | ICD-10-CM

## 2015-05-20 DIAGNOSIS — R634 Abnormal weight loss: Secondary | ICD-10-CM

## 2015-05-20 DIAGNOSIS — C20 Malignant neoplasm of rectum: Secondary | ICD-10-CM

## 2015-05-20 LAB — COMPREHENSIVE METABOLIC PANEL
ANION GAP: 9 meq/L (ref 3–11)
AST: 18 U/L (ref 5–34)
Albumin: 3 g/dL — ABNORMAL LOW (ref 3.5–5.0)
Alkaline Phosphatase: 82 U/L (ref 40–150)
BUN: 17.1 mg/dL (ref 7.0–26.0)
CALCIUM: 9.8 mg/dL (ref 8.4–10.4)
CHLORIDE: 97 meq/L — AB (ref 98–109)
CO2: 28 meq/L (ref 22–29)
CREATININE: 1.1 mg/dL (ref 0.7–1.3)
EGFR: 71 mL/min/{1.73_m2} — AB (ref >90)
Glucose: 171 mg/dl — ABNORMAL HIGH (ref 70–140)
POTASSIUM: 4.6 meq/L (ref 3.5–5.1)
Sodium: 134 mEq/L — ABNORMAL LOW (ref 136–145)
Total Bilirubin: 0.56 mg/dL (ref 0.20–1.20)
Total Protein: 7.6 g/dL (ref 6.4–8.3)

## 2015-05-20 LAB — CBC WITH DIFFERENTIAL/PLATELET
BASO%: 0.3 % (ref 0.0–2.0)
BASOS ABS: 0 10*3/uL (ref 0.0–0.1)
EOS%: 0.2 % (ref 0.0–7.0)
Eosinophils Absolute: 0 10*3/uL (ref 0.0–0.5)
HCT: 27.3 % — ABNORMAL LOW (ref 38.4–49.9)
HGB: 8.8 g/dL — ABNORMAL LOW (ref 13.0–17.1)
LYMPH%: 3.2 % — AB (ref 14.0–49.0)
MCH: 25.5 pg — AB (ref 27.2–33.4)
MCHC: 32.1 g/dL (ref 32.0–36.0)
MCV: 79.3 fL (ref 79.3–98.0)
MONO#: 0.3 10*3/uL (ref 0.1–0.9)
MONO%: 5 % (ref 0.0–14.0)
NEUT#: 6.3 10*3/uL (ref 1.5–6.5)
NEUT%: 91.3 % — AB (ref 39.0–75.0)
PLATELETS: 297 10*3/uL (ref 140–400)
RBC: 3.44 10*6/uL — AB (ref 4.20–5.82)
RDW: 16.5 % — ABNORMAL HIGH (ref 11.0–14.6)
WBC: 6.9 10*3/uL (ref 4.0–10.3)
lymph#: 0.2 10*3/uL — ABNORMAL LOW (ref 0.9–3.3)

## 2015-05-20 MED ORDER — OXYCODONE HCL 5 MG PO TABS: ORAL_TABLET | ORAL | Status: DC

## 2015-05-20 MED ORDER — SODIUM CHLORIDE 0.9 % IJ SOLN
10.0000 mL | INTRAMUSCULAR | Status: DC | PRN
  Administered 2015-05-20: 10 mL via INTRAVENOUS
  Filled 2015-05-20: qty 10

## 2015-05-20 MED ORDER — METHOCARBAMOL 500 MG PO TABS: 250.0000 mg | ORAL_TABLET | Freq: Four times a day (QID) | ORAL | Status: DC

## 2015-05-20 MED ORDER — HEPARIN SOD (PORK) LOCK FLUSH 100 UNIT/ML IV SOLN
500.0000 [IU] | Freq: Once | INTRAVENOUS | Status: AC
  Administered 2015-05-20: 500 [IU] via INTRAVENOUS
  Filled 2015-05-20: qty 5

## 2015-05-20 MED ORDER — MEGESTROL ACETATE 400 MG/10ML PO SUSP: 200.0000 mg | Freq: Two times a day (BID) | ORAL | Status: DC

## 2015-05-20 NOTE — Patient Instructions (Signed)

## 2015-05-20 NOTE — Telephone Encounter (Signed)
Per staff message and POF I have scheduled appts. Advised scheduler of appts. JMW  

## 2015-05-20 NOTE — Telephone Encounter (Signed)
per pof to sch pt appt-sent MW email to sch trmt-adv [facility] will call to sch scan-refused contrast

## 2015-05-20 NOTE — Progress Notes (Signed)
[place] OFFICE PROGRESS NOTE   Diagnosis: Rectal Cancer  INTERVAL HISTORY:  He returns as scheduled.He continues to have pain at the rectum. He takes oxycodone for pain. Robaxin causes somnolence. He has a poor appetite.  He drinks 1 boost per day.    Objective:  Vital signs in last 24 hours:  Blood pressure 126/77, pulse 109, temperature 98.6 F (37 C), temperature source Oral, resp. rate 18, height 6' 6"  (1.981 m), weight 146 lb (66.225 kg), SpO2 100 %.    Resp: lungs clear bilaterally Cardio:RRR GI urostomy/colostomy,  Fullness in the rt. Lateral subcostal region Vascular: no edema  Portacath/PICC-without erythema  Lab Results:  Lab Results  Component Value Date   WBC 6.9 05/20/2015   HGB 8.8* 05/20/2015   HCT 27.3* 05/20/2015   MCV 79.3 05/20/2015   PLT 297 05/20/2015   NEUTROABS 6.3 05/20/2015      Lab Results  Component Value Date   CEA 5.5* 04/22/2015       Medications: I have reviewed the patient's current medications.  Assessment/Plan: 1. Rectal cancer-large pelvic mass with a colonoscopy 10/11/2013 confirming an obstructing tumor at 9 cm from the anal verge  CTs of the chest, abdomen, and revealed a large complex pelvic mass, gas in the bladder, loculated gas posterior to the bladder, right hydronephrosis, and small bilateral indeterminant lung nodules.   Initiation of radiation and Xeloda 12/18/2013; completed 01/29/2014.  Restaging CT evaluation at [facility] showed decrease in size of the rectosigmoid mass from 14.6 x 10.5 cm to 9.5 x 8.6 cm. Post radiation changes. Moderate right hydronephrosis with a double-J ureteral stent in place and mild left hydronephrosis, unchanged. Pulmonary nodules unchanged. Circumferential bladder wall thickening likely secondary to radiation. Rectosigmoid mass abuts the posterior bladder and involvement cannot be excluded.  Cycle 1 Capox beginning 03/13/2014  Cycle 2 CAPOX beginning  04/03/2014.  Cycle 3 CAPOX beginning 04/24/2014.  CT 05/13/2014 confirming a decrease in the rectal mass  Pelvic exoneration at [facility] on 06/24/2014 confirming a ypT4,ypN0 tumor. The anterior and posterior cervical frontal margins were less than 1 mm, tumor involved the bladder and left ureter. Surgically included an APR, cystectomy, ileal conduit, intraoperative radiation, and sacrectomy, and vertical rectus myocutaneous flap  Cycle 1 adjuvant CAPOX 08/25/2014  Cycle 2 adjuvant CAPOX 09/15/2014  Cycle 3 adjuvant CAPOX 10/06/2014  Cycle 4 adjuvant CAPOX 10/27/2014  CT abdomen/pelvis at [facility] 11/16/2014 with chronic right hydronephrosis, nodular collection of soft tissue in the right pelvis consistent with post surgical change, though recurrent disease cannot be excluded  CT at [facility] 02/09/2015 with increased fullness of the right renal pelvis and enlargement of a lung nodule  Biopsy of the right renal pelvis 02/13/2015 confirmed metastatic colorectal cancer  Staging PET scan 03/05/2015 consistent with multiple lung metastases, a hepatic abscess, and increased FDG uptake at the tip of the sacrum 2. Right hydronephrosis secondary to the obstructing pelvic mass, status post placement of a right percutaneous nephrostomy tube followed by a right ureter stent.  Persistent hydronephrosis with a new right kidney cystic lesion on a CT 05/13/2014  Right renal parenchymal abscess and bladder stone confirmed on a cystoscopy 05/13/2014, status post a cystolithalopaxy, right ureter stent exchange  Placement of a percutaneous right renal abscess drain on 05/15/2014  CT abdomen 05/20/2014 showed near-complete drainage of the right renal abscess. The drainage catheter has been removed.  He completed a 6 week course of Levaquin. 3. Microcytic anemia-likely iron deficiency anemia secondary to bleeding from the  rectal and hematuria  4. Possible colovesical fistula-on the CT 10/09/2013 evaluated by Dr.  Alinda Money, followup CT cystogram 10/18/2013 revealed no fistula.  5. Anorexia/weight loss secondary to #1. 6. Indeterminate lung nodules on a CT of the chest 10/09/2013. Stable on CT 03/03/2014. 7. Family history of pancreas and colon cancer. 8. 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 9. History of a Colovesical fistula. 10. Hospitalization 12/10/2013 through 12/12/2013 presenting with diarrhea. CT abdomen/pelvis showed an enlarging rectal mass filling almost the entire pelvis. Air noted in the bladder and renal collecting system. Linear collection of air noted along the anterior wall of the colorectal mass abutting the bladder felt to possibly represent a site of fistulization. Stool noted throughout the right colon. Transferred to [facility]. Neoadjuvant chemotherapy/radiation recommended. 11. Skin toxicity at the perineum related to radiation and Xeloda. 12. Pneumonia on the CT 05/20/2014. He completed a course of Levaquin. 13. Right greater than left leg and foot numbness following the sacrectomy 14. Sacrum pain following the APR/ sacrectomy, persistent pain in the sacrum with increased FDG activity at the tip of the sacrum on a PET scan 03/05/2015 15. Enterococcus faecalis urinary tract infection and C. difficile colitis 11/16/2014 16. Hepatic abscess on the PET scan 03/05/2015-status post drainage followed by a course of antibiotics  Disposition:  We discussed treatment options again today. His tumor  Is KRAS wild type. We will consider Folfiri with avastin or panitumumab to start after he returns in 2 weeks. He will have restaging CTs same day as office visit.  Mr. Cheema will begin a trial of megace.  Betsy Coder, MD  05/20/2015  10:39 AM

## 2015-05-20 NOTE — Telephone Encounter (Signed)
-----   Message from [name], MD sent at 05/20/2015  3:44 PM EST ----- Start ferrous sulfate 325mg  bid if not taking, add ferritin next office

## 2015-05-21 LAB — CEA: CEA: 6.3 ng/mL — AB (ref 0.0–5.0)

## 2015-05-21 NOTE — Telephone Encounter (Signed)
Left message on voicemail for pt to call office.  

## 2015-05-21 NOTE — Telephone Encounter (Signed)
Family returned call. Notified of message below. States he has been taking ferrous sulfate daily. Will start taking BID as directed.

## 2015-06-01 ENCOUNTER — Other Ambulatory Visit: Payer: Medicaid Other

## 2015-06-01 ENCOUNTER — Ambulatory Visit (HOSPITAL_COMMUNITY): Payer: Medicaid Other

## 2015-06-02 ENCOUNTER — Ambulatory Visit (HOSPITAL_COMMUNITY)
Admission: RE | Admit: 2015-06-02 | Discharge: 2015-06-02 | Disposition: A | Discharge status: Home / Self Care | Payer: Medicaid Other | Source: Ambulatory Visit | Attending: Oncology | Admitting: Oncology

## 2015-06-02 ENCOUNTER — Other Ambulatory Visit (HOSPITAL_BASED_OUTPATIENT_CLINIC_OR_DEPARTMENT_OTHER): Payer: Medicaid Other

## 2015-06-02 ENCOUNTER — Encounter (HOSPITAL_COMMUNITY): Payer: Self-pay

## 2015-06-02 ENCOUNTER — Ambulatory Visit (HOSPITAL_COMMUNITY): Payer: Medicaid Other

## 2015-06-02 DIAGNOSIS — Z9889 Other specified postprocedural states: Secondary | ICD-10-CM | POA: Insufficient documentation

## 2015-06-02 DIAGNOSIS — C7901 Secondary malignant neoplasm of right kidney and renal pelvis: Secondary | ICD-10-CM | POA: Insufficient documentation

## 2015-06-02 DIAGNOSIS — D509 Iron deficiency anemia, unspecified: Secondary | ICD-10-CM

## 2015-06-02 DIAGNOSIS — C20 Malignant neoplasm of rectum: Secondary | ICD-10-CM | POA: Insufficient documentation

## 2015-06-02 DIAGNOSIS — M899 Disorder of bone, unspecified: Secondary | ICD-10-CM | POA: Insufficient documentation

## 2015-06-02 DIAGNOSIS — R918 Other nonspecific abnormal finding of lung field: Secondary | ICD-10-CM | POA: Insufficient documentation

## 2015-06-02 DIAGNOSIS — N2889 Other specified disorders of kidney and ureter: Secondary | ICD-10-CM | POA: Insufficient documentation

## 2015-06-02 LAB — COMPREHENSIVE METABOLIC PANEL
ALBUMIN: 3.1 g/dL — AB (ref 3.5–5.0)
ALK PHOS: 80 U/L (ref 40–150)
AST: 20 U/L (ref 5–34)
Anion Gap: 10 mEq/L (ref 3–11)
BILIRUBIN TOTAL: 0.41 mg/dL (ref 0.20–1.20)
BUN: 17.8 mg/dL (ref 7.0–26.0)
CALCIUM: 9.2 mg/dL (ref 8.4–10.4)
CO2: 25 mEq/L (ref 22–29)
CREATININE: 1.1 mg/dL (ref 0.7–1.3)
Chloride: 101 mEq/L (ref 98–109)
EGFR: 76 mL/min/{1.73_m2} — ABNORMAL LOW (ref >90)
Glucose: 116 mg/dl (ref 70–140)
POTASSIUM: 4.7 meq/L (ref 3.5–5.1)
SODIUM: 136 meq/L (ref 136–145)
TOTAL PROTEIN: 7.2 g/dL (ref 6.4–8.3)

## 2015-06-02 LAB — CBC WITH DIFFERENTIAL/PLATELET
BASO%: 0.4 % (ref 0.0–2.0)
BASOS ABS: 0 10*3/uL (ref 0.0–0.1)
EOS%: 0.2 % (ref 0.0–7.0)
Eosinophils Absolute: 0 10*3/uL (ref 0.0–0.5)
HCT: 26.6 % — ABNORMAL LOW (ref 38.4–49.9)
HGB: 8.5 g/dL — ABNORMAL LOW (ref 13.0–17.1)
LYMPH%: 5 % — ABNORMAL LOW (ref 14.0–49.0)
MCH: 25 pg — AB (ref 27.2–33.4)
MCHC: 31.9 g/dL — AB (ref 32.0–36.0)
MCV: 78.4 fL — ABNORMAL LOW (ref 79.3–98.0)
MONO#: 0.3 10*3/uL (ref 0.1–0.9)
MONO%: 4.3 % (ref 0.0–14.0)
NEUT#: 5.6 10*3/uL (ref 1.5–6.5)
NEUT%: 90.1 % — AB (ref 39.0–75.0)
Platelets: 294 10*3/uL (ref 140–400)
RBC: 3.39 10*6/uL — AB (ref 4.20–5.82)
RDW: 16.7 % — AB (ref 11.0–14.6)
WBC: 6.2 10*3/uL (ref 4.0–10.3)
lymph#: 0.3 10*3/uL — ABNORMAL LOW (ref 0.9–3.3)

## 2015-06-02 LAB — FERRITIN

## 2015-06-02 MED ORDER — IOHEXOL 300 MG/ML  SOLN
100.0000 mL | Freq: Once | INTRAMUSCULAR | Status: AC | PRN
  Administered 2015-06-02: 100 mL via INTRAVENOUS

## 2015-06-03 ENCOUNTER — Encounter: Payer: Medicaid Other | Admitting: Nutrition

## 2015-06-03 ENCOUNTER — Ambulatory Visit: Payer: Medicaid Other

## 2015-06-03 ENCOUNTER — Other Ambulatory Visit: Payer: Medicaid Other

## 2015-06-03 ENCOUNTER — Ambulatory Visit (HOSPITAL_BASED_OUTPATIENT_CLINIC_OR_DEPARTMENT_OTHER): Payer: Medicaid Other | Admitting: Nurse Practitioner

## 2015-06-03 ENCOUNTER — Telehealth: Payer: Self-pay | Admitting: Oncology

## 2015-06-03 VITALS — BP 130/75 | HR 91 | Temp 99.5°F | Resp 17 | Ht 78.0 in | Wt 146.0 lb

## 2015-06-03 DIAGNOSIS — R634 Abnormal weight loss: Secondary | ICD-10-CM

## 2015-06-03 DIAGNOSIS — M79669 Pain in unspecified lower leg: Secondary | ICD-10-CM

## 2015-06-03 DIAGNOSIS — M545 Low back pain: Secondary | ICD-10-CM

## 2015-06-03 DIAGNOSIS — G893 Neoplasm related pain (acute) (chronic): Secondary | ICD-10-CM

## 2015-06-03 DIAGNOSIS — C7800 Secondary malignant neoplasm of unspecified lung: Secondary | ICD-10-CM

## 2015-06-03 DIAGNOSIS — R630 Anorexia: Secondary | ICD-10-CM

## 2015-06-03 DIAGNOSIS — C20 Malignant neoplasm of rectum: Secondary | ICD-10-CM | POA: Diagnosis not present

## 2015-06-03 LAB — CEA: CEA: 8.7 ng/mL — ABNORMAL HIGH (ref 0.0–4.7)

## 2015-06-03 LAB — CEA (PARALLEL TESTING): CEA: 5.8 ng/mL — ABNORMAL HIGH (ref 0.0–5.0)

## 2015-06-03 MED ORDER — MINOCYCLINE HCL 100 MG PO CAPS: 100.0000 mg | ORAL_CAPSULE | Freq: Two times a day (BID) | ORAL | Status: DC

## 2015-06-03 NOTE — Telephone Encounter (Signed)
Gv pt appts for 1/24 + 2/7.

## 2015-06-03 NOTE — Progress Notes (Addendum)
[place] OFFICE PROGRESS NOTE   Diagnosis:  Rectal cancer  INTERVAL HISTORY:   Grant [name] returns as scheduled. He continues to have low back and leg pain. He takes oxycodone as needed. Appetite is somewhat better. He has recently noted blood in the urostomy bag.  Objective:  Vital signs in last 24 hours:  Blood pressure 130/75, pulse 91, temperature 99.5 F (37.5 C), temperature source Oral, resp. rate 17, height 6\' 6"  (1.981 m), weight 146 lb (66.225 kg), SpO2 100 %.    Resp: Lungs clear bilaterally. Cardio: Regular rate and rhythm. GI: Masslike fullness right upper abdomen superior to the urostomy. Blood-tinged urine in the urostomy bag. Vascular: No leg edema. Skin: Dry. Port-A-Cath without erythema.    Lab Results:  Lab Results  Component Value Date   WBC 6.2 06/02/2015   HGB 8.5* 06/02/2015   HCT 26.6* 06/02/2015   MCV 78.4* 06/02/2015   PLT 294 06/02/2015   NEUTROABS 5.6 06/02/2015    Imaging:  Ct Chest W Contrast  06/02/2015  CLINICAL DATA:  Restaging of rectal cancer diagnosed in 2015 and renal cell carcinoma diagnosed 2016. Bone and lung metastasis. Pain about the rectum. Leg pain. Chemotherapy to start. Colostomy. EXAM: CT CHEST, ABDOMEN, AND PELVIS WITH CONTRAST TECHNIQUE: Multidetector CT imaging of the chest, abdomen and pelvis was performed following the standard protocol during bolus administration of intravenous contrast. CONTRAST:  141mL OMNIPAQUE IOHEXOL 300 MG/ML  SOLN COMPARISON:  Outside CT from 03/16/2015 of the abdomen and pelvis. Report not available. An outside PET from 03/05/15 is also reviewed. Report not available. Clinic note of 05/20/2015 is reviewed. FINDINGS: CT CHEST FINDINGS Mediastinum/Nodes: No supraclavicular adenopathy. Right Port-A-Cath which terminates at the high right atrium. Mild bilateral gynecomastia. Normal heart size, without pericardial effusion. No central pulmonary embolism, on this non-dedicated study.  No mediastinal or hilar adenopathy. Lungs/Pleura: No pleural fluid. Mild centrilobular emphysema. Multiple bilateral pulmonary nodules. Right upper lobe index nodule measures 1.6 cm on image 23/series 5. Nodule measured 1.3 cm on the prior PET (when remeasured). Right lower lobe pulmonary nodule measures 1.5 cm on image 53/ series 5. 1.2 cm on the prior PET (when remeasured). Lingular nodule measures 7 mm on image 37/series 5 versus similar on the prior. 7 mm right lower lobe pulmonary nodule on image 45/series 5 measured 5 mm on the prior PET. Musculoskeletal: No acute osseous abnormality. CT ABDOMEN PELVIS FINDINGS Hepatobiliary: Resolution of previously described right hepatic lobe abscess. Vague ill-defined hypoattenuation remains in this area, including on image 71/series 2. No well-defined residual or new hepatic lesion is seen. Normal gallbladder, without biliary ductal dilatation. Pancreas: Normal, without mass or ductal dilatation. Spleen: Mild splenomegaly, similar. 14 cm craniocaudal. No focal splenic abnormality. Adrenals/Urinary Tract: Normal left adrenal gland. Right adrenal not well evaluated. Normal left kidney, without left-sided hydronephrosis. Soft tissue mass replacing the right kidney and extending into the right flank musculature is significantly progressive. Measures on the order of 16.0 x 9.7 cm on image 77/series 2. At the same level on 03/16/2015, this measured 8.7 x 7.2 cm (when remeasured). Extension in the right renal pelvis. No residual right renal function or contrast excretion. Cystectomy with ileal conduit. Stomach/Bowel: The gastro duodenal junction is displaced by the massive right renal lesion. No obstruction. Left lower quadrant descending colostomy. Constipation throughout the remainder of the colon. Normal terminal ileum and appendix. No small bowel obstruction. Vascular/Lymphatic: Aortic and branch vessel atherosclerosis. No retroperitoneal or retrocrural adenopathy. No pelvic  sidewall adenopathy.  Reproductive: Prostatectomy. Other: No significant free fluid. No gross peritoneal disease. Presacral interstitial thickening is likely treatment related. Asymmetric soft tissue density about the right lateral pelvic wall is similar to on the most recent exams and could be postoperative. Musculoskeletal: Osteoarthritis of both hips. Lucency in the right sacrum on image 116/series 2 is suspicious for metastatic disease or direct tumor involvement. This is similar on the prior PET and not significantly hypermetabolic on that study. Soft tissue fullness about the coccyx, which is ill-defined. Example at 4.1 x 3.7 cm on image 126/series 2. This measured 3.0 x 2.8 cm on 03/16/2015 and is new since 05/12/2014. Osteopenia. IMPRESSION: CT CHEST IMPRESSION 1. Mild progression of pulmonary nodules compared to the outside PET of 03/05/2015. 2. No thoracic adenopathy. CT ABDOMEN AND PELVIS IMPRESSION 1. Resolution of right hepatic abscess. Only vague hepatic hypoattenuation remains in this area. 2. Significant enlargement since the outside CT of 03/16/2015 of a right renal mass which has been diagnosed as a metastasis previously. 3. Extensive surgical changes, including cystectomy, prostatectomy, low rectal resection. Increasing soft tissue fullness about the coccyx, suspicious for direct tumor involvement and/or osseous metastasis. Similarly, ill definition of the right sacrum is suspicious for direct tumor involvement and/or osseous metastasis. Of note, these areas were relatively non FDG avid. Electronically Signed   By: Abigail Miyamoto M.D.   On: 06/02/2015 17:19   Ct Abdomen Pelvis W Contrast  06/02/2015  CLINICAL DATA:  Restaging of rectal cancer diagnosed in 2015 and renal cell carcinoma diagnosed 2016. Bone and lung metastasis. Pain about the rectum. Leg pain. Chemotherapy to start. Colostomy. EXAM: CT CHEST, ABDOMEN, AND PELVIS WITH CONTRAST TECHNIQUE: Multidetector CT imaging of the chest, abdomen  and pelvis was performed following the standard protocol during bolus administration of intravenous contrast. CONTRAST:  143mL OMNIPAQUE IOHEXOL 300 MG/ML  SOLN COMPARISON:  Outside CT from 03/16/2015 of the abdomen and pelvis. Report not available. An outside PET from 03/05/15 is also reviewed. Report not available. Clinic note of 05/20/2015 is reviewed. FINDINGS: CT CHEST FINDINGS Mediastinum/Nodes: No supraclavicular adenopathy. Right Port-A-Cath which terminates at the high right atrium. Mild bilateral gynecomastia. Normal heart size, without pericardial effusion. No central pulmonary embolism, on this non-dedicated study. No mediastinal or hilar adenopathy. Lungs/Pleura: No pleural fluid. Mild centrilobular emphysema. Multiple bilateral pulmonary nodules. Right upper lobe index nodule measures 1.6 cm on image 23/series 5. Nodule measured 1.3 cm on the prior PET (when remeasured). Right lower lobe pulmonary nodule measures 1.5 cm on image 53/ series 5. 1.2 cm on the prior PET (when remeasured). Lingular nodule measures 7 mm on image 37/series 5 versus similar on the prior. 7 mm right lower lobe pulmonary nodule on image 45/series 5 measured 5 mm on the prior PET. Musculoskeletal: No acute osseous abnormality. CT ABDOMEN PELVIS FINDINGS Hepatobiliary: Resolution of previously described right hepatic lobe abscess. Vague ill-defined hypoattenuation remains in this area, including on image 71/series 2. No well-defined residual or new hepatic lesion is seen. Normal gallbladder, without biliary ductal dilatation. Pancreas: Normal, without mass or ductal dilatation. Spleen: Mild splenomegaly, similar. 14 cm craniocaudal. No focal splenic abnormality. Adrenals/Urinary Tract: Normal left adrenal gland. Right adrenal not well evaluated. Normal left kidney, without left-sided hydronephrosis. Soft tissue mass replacing the right kidney and extending into the right flank musculature is significantly progressive. Measures on  the order of 16.0 x 9.7 cm on image 77/series 2. At the same level on 03/16/2015, this measured 8.7 x 7.2 cm (when remeasured). Extension in the  right renal pelvis. No residual right renal function or contrast excretion. Cystectomy with ileal conduit. Stomach/Bowel: The gastro duodenal junction is displaced by the massive right renal lesion. No obstruction. Left lower quadrant descending colostomy. Constipation throughout the remainder of the colon. Normal terminal ileum and appendix. No small bowel obstruction. Vascular/Lymphatic: Aortic and branch vessel atherosclerosis. No retroperitoneal or retrocrural adenopathy. No pelvic sidewall adenopathy. Reproductive: Prostatectomy. Other: No significant free fluid. No gross peritoneal disease. Presacral interstitial thickening is likely treatment related. Asymmetric soft tissue density about the right lateral pelvic wall is similar to on the most recent exams and could be postoperative. Musculoskeletal: Osteoarthritis of both hips. Lucency in the right sacrum on image 116/series 2 is suspicious for metastatic disease or direct tumor involvement. This is similar on the prior PET and not significantly hypermetabolic on that study. Soft tissue fullness about the coccyx, which is ill-defined. Example at 4.1 x 3.7 cm on image 126/series 2. This measured 3.0 x 2.8 cm on 03/16/2015 and is new since 05/12/2014. Osteopenia. IMPRESSION: CT CHEST IMPRESSION 1. Mild progression of pulmonary nodules compared to the outside PET of 03/05/2015. 2. No thoracic adenopathy. CT ABDOMEN AND PELVIS IMPRESSION 1. Resolution of right hepatic abscess. Only vague hepatic hypoattenuation remains in this area. 2. Significant enlargement since the outside CT of 03/16/2015 of a right renal mass which has been diagnosed as a metastasis previously. 3. Extensive surgical changes, including cystectomy, prostatectomy, low rectal resection. Increasing soft tissue fullness about the coccyx, suspicious for  direct tumor involvement and/or osseous metastasis. Similarly, ill definition of the right sacrum is suspicious for direct tumor involvement and/or osseous metastasis. Of note, these areas were relatively non FDG avid. Electronically Signed   By: Abigail Miyamoto M.D.   On: 06/02/2015 17:19    Medications: I have reviewed the patient's current medications.  Assessment/Plan: 1. Rectal cancer-large pelvic mass with a colonoscopy 10/11/2013 confirming an obstructing tumor at 9 cm from the anal verge  CTs of the chest, abdomen, and revealed a large complex pelvic mass, gas in the bladder, loculated gas posterior to the bladder, right hydronephrosis, and small bilateral indeterminant lung nodules.   Initiation of radiation and Xeloda 12/18/2013; completed 01/29/2014.  Restaging CT evaluation at [facility] showed decrease in size of the rectosigmoid mass from 14.6 x 10.5 cm to 9.5 x 8.6 cm. Post radiation changes. Moderate right hydronephrosis with a double-J ureteral stent in place and mild left hydronephrosis, unchanged. Pulmonary nodules unchanged. Circumferential bladder wall thickening likely secondary to radiation. Rectosigmoid mass abuts the posterior bladder and involvement cannot be excluded.  Cycle 1 Capox beginning 03/13/2014  Cycle 2 CAPOX beginning 04/03/2014.  Cycle 3 CAPOX beginning 04/24/2014.  CT 05/13/2014 confirming a decrease in the rectal mass  Pelvic exoneration at [facility] on 06/24/2014 confirming a ypT4,ypN0 tumor. The anterior and posterior cervical frontal margins were less than 1 mm, tumor involved the bladder and left ureter. Surgically included an APR, cystectomy, ileal conduit, intraoperative radiation, and sacrectomy, and vertical rectus myocutaneous flap  Cycle 1 adjuvant CAPOX 08/25/2014  Cycle 2 adjuvant CAPOX 09/15/2014  Cycle 3 adjuvant CAPOX 10/06/2014  Cycle 4 adjuvant CAPOX 10/27/2014  CT abdomen/pelvis at [facility] 11/16/2014 with chronic right hydronephrosis, nodular  collection of soft tissue in the right pelvis consistent with post surgical change, though recurrent disease cannot be excluded  CT at [facility] 02/09/2015 with increased fullness of the right renal pelvis and enlargement of a lung nodule  Biopsy of the right renal pelvis 02/13/2015 confirmed metastatic colorectal  cancer  Staging PET scan 03/05/2015 consistent with multiple lung metastases, a hepatic abscess, and increased FDG uptake at the tip of the sacrum  Restaging CT scans 06/02/2015 showed mild progression of pulmonary nodules; resolution of right hepatic abscess; significant enlargement of a right renal mass; extensive surgical changes; increasing soft tissue fullness about the coccyx suspicious for direct involvement and/or osseous metastasis; ill definition of the right sacrum suspicious for direct tumor involvement and/or osseous metastasis. 2. Right hydronephrosis secondary to the obstructing pelvic mass, status post placement of a right percutaneous nephrostomy tube followed by a right ureter stent.  Persistent hydronephrosis with a new right kidney cystic lesion on a CT 05/13/2014  Right renal parenchymal abscess and bladder stone confirmed on a cystoscopy 05/13/2014, status post a cystolithalopaxy, right ureter stent exchange  Placement of a percutaneous right renal abscess drain on 05/15/2014  CT abdomen 05/20/2014 showed near-complete drainage of the right renal abscess. The drainage catheter has been removed.  He completed a 6 week course of Levaquin. 3. Microcytic anemia-likely iron deficiency anemia secondary to bleeding from the rectal and hematuria  4. Possible colovesical fistula-on the CT 10/09/2013 evaluated by Dr. Alinda Money, followup CT cystogram 10/18/2013 revealed no fistula.  5. Anorexia/weight loss secondary to #1. 6. Indeterminate lung nodules on a CT of the chest 10/09/2013. Stable on CT 03/03/2014. 7. Family history of pancreas and colon cancer. 8. Retracted stoma  12/03/2013. 9. History of a Colovesical fistula. 10. Hospitalization 12/10/2013 through 12/12/2013 presenting with diarrhea. CT abdomen/pelvis showed an enlarging rectal mass filling almost the entire pelvis. Air noted in the bladder and renal collecting system. Linear collection of air noted along the anterior wall of the colorectal mass abutting the bladder felt to possibly represent a site of fistulization. Stool noted throughout the right colon. Transferred to [facility]. Neoadjuvant chemotherapy/radiation recommended. 11. Skin toxicity at the perineum related to radiation and Xeloda. 12. Pneumonia on the CT 05/20/2014. He completed a course of Levaquin. 13. Right greater than left leg and foot numbness following the sacrectomy 14. Sacrum pain following the APR/ sacrectomy, persistent pain in the sacrum with increased FDG activity at the tip of the sacrum on a PET scan 03/05/2015 15. Enterococcus faecalis urinary tract infection and C. difficile colitis 11/16/2014 16. Hepatic abscess on the PET scan 03/05/2015-status post drainage followed by a course of antibiotics   Disposition: Grant [name] appears unchanged. The CT scan from yesterday shows evidence of progression, most notably involving the right renal mass. He would like to proceed with treatment. Dr. Benay Spice recommends FOLFIRI/PANITUMUMAB. We reviewed potential toxicities including myelosuppression, nausea, mouth sores, diarrhea, hair loss, allergic reaction. We discussed the possibility of severe diarrhea with irinotecan. We reviewed potential toxicities associated with 5-fluorouracil including diarrhea, rash, increased sensitivity to the sun, conjunctivitis, hand-foot syndrome. We also discussed the possibility of a rash and diarrhea with PANITUMUMAB. He is agreeable to proceed. He will begin minocycline 100 mg twice daily. He will return to begin cycle 1 on 06/09/2015. We will see him in follow-up prior to cycle 2 on 06/23/2015. He will contact  the office in the interim with any problems.  Patient seen with Dr. Benay Spice. 25 minutes were spent face-to-face at today's visit with the majority of that time involved in counseling/coordination of care.  Ned Card ANP/GNP-BC   06/03/2015  11:30 AM  This was a shared visit with Ned Card. Grant [name] was interviewed and examined. I reviewed the restaging CT images. There is evidence of disease progression on the  CT scans. We recommend FOLFIRI/panitumumab. He agrees to proceed.  Julieanne Manson, M.D.

## 2015-06-04 ENCOUNTER — Telehealth: Payer: Self-pay | Admitting: *Deleted

## 2015-06-04 NOTE — Telephone Encounter (Signed)
Call received in [place] from pt's caregiver stating need for refill on xanax " we were there yesterday but forgot to ask "  Prescription called to local pharmacy per above request.

## 2015-06-05 ENCOUNTER — Other Ambulatory Visit: Payer: Self-pay | Admitting: Oncology

## 2015-06-07 ENCOUNTER — Other Ambulatory Visit: Payer: Self-pay | Admitting: Oncology

## 2015-06-08 ENCOUNTER — Encounter: Payer: Self-pay | Admitting: Pharmacist

## 2015-06-09 ENCOUNTER — Ambulatory Visit (HOSPITAL_BASED_OUTPATIENT_CLINIC_OR_DEPARTMENT_OTHER): Payer: Medicaid Other

## 2015-06-09 ENCOUNTER — Other Ambulatory Visit (HOSPITAL_BASED_OUTPATIENT_CLINIC_OR_DEPARTMENT_OTHER): Payer: Medicaid Other

## 2015-06-09 VITALS — BP 113/73 | HR 87 | Temp 98.2°F

## 2015-06-09 DIAGNOSIS — C7800 Secondary malignant neoplasm of unspecified lung: Secondary | ICD-10-CM

## 2015-06-09 DIAGNOSIS — Z5111 Encounter for antineoplastic chemotherapy: Secondary | ICD-10-CM

## 2015-06-09 DIAGNOSIS — Z5112 Encounter for antineoplastic immunotherapy: Secondary | ICD-10-CM

## 2015-06-09 DIAGNOSIS — C20 Malignant neoplasm of rectum: Secondary | ICD-10-CM | POA: Diagnosis present

## 2015-06-09 LAB — CBC WITH DIFFERENTIAL/PLATELET
BASO%: 0.2 % (ref 0.0–2.0)
BASOS ABS: 0 10*3/uL (ref 0.0–0.1)
EOS%: 0.2 % (ref 0.0–7.0)
Eosinophils Absolute: 0 10*3/uL (ref 0.0–0.5)
HCT: 27.6 % — ABNORMAL LOW (ref 38.4–49.9)
HGB: 8.4 g/dL — ABNORMAL LOW (ref 13.0–17.1)
LYMPH%: 5.8 % — AB (ref 14.0–49.0)
MCH: 24.8 pg — AB (ref 27.2–33.4)
MCHC: 30.4 g/dL — AB (ref 32.0–36.0)
MCV: 81.4 fL (ref 79.3–98.0)
MONO#: 0.2 10*3/uL (ref 0.1–0.9)
MONO%: 3.7 % (ref 0.0–14.0)
NEUT#: 4.7 10*3/uL (ref 1.5–6.5)
NEUT%: 90.1 % — AB (ref 39.0–75.0)
PLATELETS: 252 10*3/uL (ref 140–400)
RBC: 3.39 10*6/uL — AB (ref 4.20–5.82)
RDW: 15.5 % — ABNORMAL HIGH (ref 11.0–14.6)
WBC: 5.2 10*3/uL (ref 4.0–10.3)
lymph#: 0.3 10*3/uL — ABNORMAL LOW (ref 0.9–3.3)

## 2015-06-09 LAB — COMPREHENSIVE METABOLIC PANEL
ANION GAP: 9 meq/L (ref 3–11)
AST: 17 U/L (ref 5–34)
Albumin: 2.9 g/dL — ABNORMAL LOW (ref 3.5–5.0)
Alkaline Phosphatase: 78 U/L (ref 40–150)
BUN: 14.5 mg/dL (ref 7.0–26.0)
CALCIUM: 9.7 mg/dL (ref 8.4–10.4)
CHLORIDE: 102 meq/L (ref 98–109)
CO2: 24 meq/L (ref 22–29)
Creatinine: 1.2 mg/dL (ref 0.7–1.3)
EGFR: 70 mL/min/{1.73_m2} — AB (ref >90)
Glucose: 168 mg/dl — ABNORMAL HIGH (ref 70–140)
POTASSIUM: 4.3 meq/L (ref 3.5–5.1)
Sodium: 135 mEq/L — ABNORMAL LOW (ref 136–145)
Total Bilirubin: 0.34 mg/dL (ref 0.20–1.20)
Total Protein: 7.9 g/dL (ref 6.4–8.3)

## 2015-06-09 LAB — MAGNESIUM: MAGNESIUM: 1.8 mg/dL (ref 1.5–2.5)

## 2015-06-09 MED ORDER — SODIUM CHLORIDE 0.9 % IV SOLN
Freq: Once | INTRAVENOUS | Status: AC
  Administered 2015-06-09: 13:00:00 via INTRAVENOUS

## 2015-06-09 MED ORDER — ATROPINE SULFATE 1 MG/ML IJ SOLN
0.5000 mg | Freq: Once | INTRAMUSCULAR | Status: AC | PRN
  Administered 2015-06-09: 0.5 mg via INTRAVENOUS

## 2015-06-09 MED ORDER — SODIUM CHLORIDE 0.9 % IV SOLN: Freq: Once | INTRAVENOUS | Status: DC

## 2015-06-09 MED ORDER — IRINOTECAN HCL CHEMO INJECTION 100 MG/5ML
178.0000 mg/m2 | Freq: Once | INTRAVENOUS | Status: AC
  Administered 2015-06-09: 340 mg via INTRAVENOUS
  Filled 2015-06-09: qty 17

## 2015-06-09 MED ORDER — SODIUM CHLORIDE 0.9 % IV SOLN
6.0000 mg/kg | Freq: Once | INTRAVENOUS | Status: AC
  Administered 2015-06-09: 400 mg via INTRAVENOUS
  Filled 2015-06-09: qty 20

## 2015-06-09 MED ORDER — LEUCOVORIN CALCIUM INJECTION 100 MG
20.0000 mg/m2 | Freq: Once | INTRAMUSCULAR | Status: AC
  Administered 2015-06-09: 38 mg via INTRAVENOUS
  Filled 2015-06-09: qty 1.9

## 2015-06-09 MED ORDER — SODIUM CHLORIDE 0.9 % IV SOLN
10.0000 mg | Freq: Once | INTRAVENOUS | Status: AC
  Administered 2015-06-09: 10 mg via INTRAVENOUS
  Filled 2015-06-09: qty 1

## 2015-06-09 MED ORDER — SODIUM CHLORIDE 0.9 % IJ SOLN
10.0000 mL | INTRAMUSCULAR | Status: DC | PRN
  Filled 2015-06-09: qty 10

## 2015-06-09 MED ORDER — FLUOROURACIL CHEMO INJECTION 2.5 GM/50ML
400.0000 mg/m2 | Freq: Once | INTRAVENOUS | Status: AC
  Administered 2015-06-09: 750 mg via INTRAVENOUS
  Filled 2015-06-09: qty 15

## 2015-06-09 MED ORDER — FLUOROURACIL CHEMO INJECTION 5 GM/100ML
2400.0000 mg/m2 | INTRAVENOUS | Status: DC
  Administered 2015-06-09: 4600 mg via INTRAVENOUS
  Filled 2015-06-09: qty 92

## 2015-06-09 MED ORDER — ATROPINE SULFATE 1 MG/ML IJ SOLN
INTRAMUSCULAR | Status: AC
  Filled 2015-06-09: qty 1

## 2015-06-09 MED ORDER — PALONOSETRON HCL INJECTION 0.25 MG/5ML
0.2500 mg | Freq: Once | INTRAVENOUS | Status: AC
  Administered 2015-06-09: 0.25 mg via INTRAVENOUS

## 2015-06-09 MED ORDER — HEPARIN SOD (PORK) LOCK FLUSH 100 UNIT/ML IV SOLN
500.0000 [IU] | Freq: Once | INTRAVENOUS | Status: DC | PRN
  Filled 2015-06-09: qty 5

## 2015-06-09 MED ORDER — PALONOSETRON HCL INJECTION 0.25 MG/5ML
INTRAVENOUS | Status: AC
  Filled 2015-06-09: qty 5

## 2015-06-09 NOTE — Progress Notes (Signed)
Pt tolerated first treatment with Vectibix,Irrinotecan, and 5FU. Pt denies any complaints during entire treatment. Pt given education on home management after chemo and gave pt chemo spill kit to take home. Pt to come back for pump dc on Thurs 1/26 at 1430. Pt aware and AVS printed out for future appts. In basket msg sent for follow up call tomorrow. Pt PAC flushes well with good blood return. Used antimicrobial disc and sorbaview dressing for 46 hrs of access. Pt understands plan of care and questions answered.

## 2015-06-09 NOTE — Patient Instructions (Signed)
[facility] Discharge Instructions for Patients Receiving Chemotherapy  Today you received the following chemotherapy agents Vectibix, Irrenotecan, Fluorouracil  To help prevent nausea and vomiting after your treatment, we encourage you to take your nausea medication as directed by your MD.  If you develop nausea and vomiting that is not controlled by your nausea medication, call the clinic.   BELOW ARE SYMPTOMS THAT SHOULD BE REPORTED IMMEDIATELY:  *FEVER GREATER THAN 100.5 F  *CHILLS WITH OR WITHOUT FEVER  NAUSEA AND VOMITING THAT IS NOT CONTROLLED WITH YOUR NAUSEA MEDICATION  *UNUSUAL SHORTNESS OF BREATH  *UNUSUAL BRUISING OR BLEEDING  TENDERNESS IN MOUTH AND THROAT WITH OR WITHOUT PRESENCE OF ULCERS  *URINARY PROBLEMS  *BOWEL PROBLEMS  UNUSUAL RASH Items with * indicate a potential emergency and should be followed up as soon as possible.  Feel free to call the clinic you have any questions or concerns. The clinic phone number is [phone].  Please show the [facility] at check-in to the Emergency Department and triage nurse.   Panitumumab Solution for Injection What is this medicine? PANITUMUMAB (pan i TOOM ue mab) is a monoclonal antibody. It is used to treat colorectal cancer. This medicine may be used for other purposes; ask your health care provider or pharmacist if you have questions. What should I tell my health care provider before I take this medicine? They need to know if you have any of these conditions: -lung disease, especially lung fibrosis -skin conditions or sensitivity -an unusual or allergic reaction to panitumumab, mouse proteins, other medicines, foods, dyes, or preservatives -pregnant or trying to get pregnant -breast-feeding How should I use this medicine? This drug is given as an infusion into a vein. It is administered in a hospital or clinic by a specially trained health care professional. Talk to your pediatrician  regarding the use of this medicine in children. Special care may be needed. Overdosage: If you think you have taken too much of this medicine contact a poison control center or emergency room at once. NOTE: This medicine is only for you. Do not share this medicine with others. What if I miss a dose? It is important not to miss your dose. Call your doctor or health care professional if you are unable to keep an appointment. What may interact with this medicine? -some medicines for cancer This list may not describe all possible interactions. Give your health care provider a list of all the medicines, herbs, non-prescription drugs, or dietary supplements you use. Also tell them if you smoke, drink alcohol, or use illegal drugs. Some items may interact with your medicine. What should I watch for while using this medicine? Visit your doctor for checks on your progress. This drug may make you feel generally unwell. This is not uncommon, as chemotherapy can affect healthy cells as well as cancer cells. Report any side effects. Continue your course of treatment even though you feel ill unless your doctor tells you to stop. This medicine can make you more sensitive to the sun. Keep out of the sun while receiving this medicine and for 2 months after the last dose. If you cannot avoid being in the sun, wear protective clothing and use sunscreen. Do not use sun lamps or tanning beds/booths. In some cases, you may be given additional medicines to help with side effects. Follow all directions for their use. Call your doctor or health care professional for advice if you get a fever, chills or sore throat, or other  symptoms of a cold or flu. Do not treat yourself. This drug decreases your body's ability to fight infections. Try to avoid being around people who are sick. Avoid taking products that contain aspirin, acetaminophen, ibuprofen, naproxen, or ketoprofen unless instructed by your doctor. These medicines may  hide a fever. Do not become pregnant while taking this medicine and for 6 months after the last dose. Women should inform their doctor if they wish to become pregnant or think they might be pregnant. Men should not father a child while taking this medicine and for 6 months after the last dose. There is a potential for serious side effects to an unborn child. Talk to your health care professional or pharmacist for more information. Do not breast-feed an infant while taking this medicine. What side effects may I notice from receiving this medicine? Side effects that you should report to your doctor or health care professional as soon as possible: -allergic reactions like skin rash, itching or hives, swelling of the face, lips, or tongue -breathing problems -changes in vision -fast, irregular heartbeat -feeling faint or lightheaded, falls -fever, chills -mouth sores -swelling of the ankles, feet, hands -unusually weak or tired Side effects that usually do not require medical attention (report to your doctor or health care professional if they continue or are bothersome): -changes in skin like acne, cracks, skin dryness -constipation -diarrhea -eyelash growth -headache -nail changes -nausea, vomiting -stomach upset This list may not describe all possible side effects. Call your doctor for medical advice about side effects. You may report side effects to FDA at 1-800-FDA-1088. Where should I keep my medicine? This drug is given in a hospital or clinic and will not be stored at home. NOTE: This sheet is a summary. It may not cover all possible information. If you have questions about this medicine, talk to your doctor, pharmacist, or health care provider.    2016, Elsevier/Gold Standard. (2014-07-01 17:21:33)   Irinotecan injection What is this medicine? IRINOTECAN (ir in oh TEE kan ) is a chemotherapy drug. It is used to treat colon and rectal cancer. This medicine may be used for other  purposes; ask your health care provider or pharmacist if you have questions. What should I tell my health care provider before I take this medicine? They need to know if you have any of these conditions: -blood disorders -dehydration -diarrhea -infection (especially a virus infection such as chickenpox, cold sores, or herpes) -liver disease -low blood counts, like low white cell, platelet, or red cell counts -recent or ongoing radiation therapy -an unusual or allergic reaction to irinotecan, sorbitol, other chemotherapy, other medicines, foods, dyes, or preservatives -pregnant or trying to get pregnant -breast-feeding How should I use this medicine? This drug is given as an infusion into a vein. It is administered in a hospital or clinic by a specially trained health care professional. Talk to your pediatrician regarding the use of this medicine in children. Special care may be needed. Overdosage: If you think you have taken too much of this medicine contact a poison control center or emergency room at once. NOTE: This medicine is only for you. Do not share this medicine with others. What if I miss a dose? It is important not to miss your dose. Call your doctor or health care professional if you are unable to keep an appointment. What may interact with this medicine? Do not take this medicine with any of the following medications: -atazanavir -certain medicines for fungal infections like itraconazole and  ketoconazole -St. John's Wort This medicine may also interact with the following medications: -dexamethasone -diuretics -laxatives -medicines for seizures like carbamazepine, mephobarbital, phenobarbital, phenytoin, primidone -medicines to increase blood counts like filgrastim, pegfilgrastim, sargramostim -prochlorperazine -vaccines This list may not describe all possible interactions. Give your health care provider a list of all the medicines, herbs, non-prescription drugs, or  dietary supplements you use. Also tell them if you smoke, drink alcohol, or use illegal drugs. Some items may interact with your medicine. What should I watch for while using this medicine? Your condition will be monitored carefully while you are receiving this medicine. You will need important blood work done while you are taking this medicine. This drug may make you feel generally unwell. This is not uncommon, as chemotherapy can affect healthy cells as well as cancer cells. Report any side effects. Continue your course of treatment even though you feel ill unless your doctor tells you to stop. In some cases, you may be given additional medicines to help with side effects. Follow all directions for their use. You may get drowsy or dizzy. Do not drive, use machinery, or do anything that needs mental alertness until you know how this medicine affects you. Do not stand or sit up quickly, especially if you are an older patient. This reduces the risk of dizzy or fainting spells. Call your doctor or health care professional for advice if you get a fever, chills or sore throat, or other symptoms of a cold or flu. Do not treat yourself. This drug decreases your body's ability to fight infections. Try to avoid being around people who are sick. This medicine may increase your risk to bruise or bleed. Call your doctor or health care professional if you notice any unusual bleeding. Be careful brushing and flossing your teeth or using a toothpick because you may get an infection or bleed more easily. If you have any dental work done, tell your dentist you are receiving this medicine. Avoid taking products that contain aspirin, acetaminophen, ibuprofen, naproxen, or ketoprofen unless instructed by your doctor. These medicines may hide a fever. Do not become pregnant while taking this medicine. Women should inform their doctor if they wish to become pregnant or think they might be pregnant. There is a potential for  serious side effects to an unborn child. Talk to your health care professional or pharmacist for more information. Do not breast-feed an infant while taking this medicine. What side effects may I notice from receiving this medicine? Side effects that you should report to your doctor or health care professional as soon as possible: -allergic reactions like skin rash, itching or hives, swelling of the face, lips, or tongue -low blood counts - this medicine may decrease the number of white blood cells, red blood cells and platelets. You may be at increased risk for infections and bleeding. -signs of infection - fever or chills, cough, sore throat, pain or difficulty passing urine -signs of decreased platelets or bleeding - bruising, pinpoint red spots on the skin, black, tarry stools, blood in the urine -signs of decreased red blood cells - unusually weak or tired, fainting spells, lightheadedness -breathing problems -chest pain -diarrhea -feeling faint or lightheaded, falls -flushing, runny nose, sweating during infusion -mouth sores or pain -pain, swelling, redness or irritation where injected -pain, swelling, warmth in the leg -pain, tingling, numbness in the hands or feet -problems with balance, talking, walking -stomach cramps, pain -trouble passing urine or change in the amount of urine -vomiting as to  be unable to hold down drinks or food -yellowing of the eyes or skin Side effects that usually do not require medical attention (report to your doctor or health care professional if they continue or are bothersome): -constipation -hair loss -headache -loss of appetite -nausea, vomiting -stomach upset This list may not describe all possible side effects. Call your doctor for medical advice about side effects. You may report side effects to FDA at 1-800-FDA-1088. Where should I keep my medicine? This drug is given in a hospital or clinic and will not be stored at home. NOTE: This sheet  is a summary. It may not cover all possible information. If you have questions about this medicine, talk to your doctor, pharmacist, or health care provider.    2016, Elsevier/Gold Standard. (2012-10-29 16:29:32)  Fluorouracil, 5-FU injection What is this medicine? FLUOROURACIL, 5-FU (flure oh YOOR a sil) is a chemotherapy drug. It slows the growth of cancer cells. This medicine is used to treat many types of cancer like breast cancer, colon or rectal cancer, pancreatic cancer, and stomach cancer. This medicine may be used for other purposes; ask your health care provider or pharmacist if you have questions. What should I tell my health care provider before I take this medicine? They need to know if you have any of these conditions: -blood disorders -dihydropyrimidine dehydrogenase (DPD) deficiency -infection (especially a virus infection such as chickenpox, cold sores, or herpes) -kidney disease -liver disease -malnourished, poor nutrition -recent or ongoing radiation therapy -an unusual or allergic reaction to fluorouracil, other chemotherapy, other medicines, foods, dyes, or preservatives -pregnant or trying to get pregnant -breast-feeding How should I use this medicine? This drug is given as an infusion or injection into a vein. It is administered in a hospital or clinic by a specially trained health care professional. Talk to your pediatrician regarding the use of this medicine in children. Special care may be needed. Overdosage: If you think you have taken too much of this medicine contact a poison control center or emergency room at once. NOTE: This medicine is only for you. Do not share this medicine with others. What if I miss a dose? It is important not to miss your dose. Call your doctor or health care professional if you are unable to keep an appointment. What may interact with this  medicine? -allopurinol -cimetidine -dapsone -digoxin -hydroxyurea -leucovorin -levamisole -medicines for seizures like ethotoin, fosphenytoin, phenytoin -medicines to increase blood counts like filgrastim, pegfilgrastim, sargramostim -medicines that treat or prevent blood clots like warfarin, enoxaparin, and dalteparin -methotrexate -metronidazole -pyrimethamine -some other chemotherapy drugs like busulfan, cisplatin, estramustine, vinblastine -trimethoprim -trimetrexate -vaccines Talk to your doctor or health care professional before taking any of these medicines: -acetaminophen -aspirin -ibuprofen -ketoprofen -naproxen This list may not describe all possible interactions. Give your health care provider a list of all the medicines, herbs, non-prescription drugs, or dietary supplements you use. Also tell them if you smoke, drink alcohol, or use illegal drugs. Some items may interact with your medicine. What should I watch for while using this medicine? Visit your doctor for checks on your progress. This drug may make you feel generally unwell. This is not uncommon, as chemotherapy can affect healthy cells as well as cancer cells. Report any side effects. Continue your course of treatment even though you feel ill unless your doctor tells you to stop. In some cases, you may be given additional medicines to help with side effects. Follow all directions for their use. Call your doctor or  health care professional for advice if you get a fever, chills or sore throat, or other symptoms of a cold or flu. Do not treat yourself. This drug decreases your body's ability to fight infections. Try to avoid being around people who are sick. This medicine may increase your risk to bruise or bleed. Call your doctor or health care professional if you notice any unusual bleeding. Be careful brushing and flossing your teeth or using a toothpick because you may get an infection or bleed more easily. If you  have any dental work done, tell your dentist you are receiving this medicine. Avoid taking products that contain aspirin, acetaminophen, ibuprofen, naproxen, or ketoprofen unless instructed by your doctor. These medicines may hide a fever. Do not become pregnant while taking this medicine. Women should inform their doctor if they wish to become pregnant or think they might be pregnant. There is a potential for serious side effects to an unborn child. Talk to your health care professional or pharmacist for more information. Do not breast-feed an infant while taking this medicine. Men should inform their doctor if they wish to father a child. This medicine may lower sperm counts. Do not treat diarrhea with over the counter products. Contact your doctor if you have diarrhea that lasts more than 2 days or if it is severe and watery. This medicine can make you more sensitive to the sun. Keep out of the sun. If you cannot avoid being in the sun, wear protective clothing and use sunscreen. Do not use sun lamps or tanning beds/booths. What side effects may I notice from receiving this medicine? Side effects that you should report to your doctor or health care professional as soon as possible: -allergic reactions like skin rash, itching or hives, swelling of the face, lips, or tongue -low blood counts - this medicine may decrease the number of white blood cells, red blood cells and platelets. You may be at increased risk for infections and bleeding. -signs of infection - fever or chills, cough, sore throat, pain or difficulty passing urine -signs of decreased platelets or bleeding - bruising, pinpoint red spots on the skin, black, tarry stools, blood in the urine -signs of decreased red blood cells - unusually weak or tired, fainting spells, lightheadedness -breathing problems -changes in vision -chest pain -mouth sores -nausea and vomiting -pain, swelling, redness at site where injected -pain, tingling,  numbness in the hands or feet -redness, swelling, or sores on hands or feet -stomach pain -unusual bleeding Side effects that usually do not require medical attention (report to your doctor or health care professional if they continue or are bothersome): -changes in finger or toe nails -diarrhea -dry or itchy skin -hair loss -headache -loss of appetite -sensitivity of eyes to the light -stomach upset -unusually teary eyes This list may not describe all possible side effects. Call your doctor for medical advice about side effects. You may report side effects to FDA at 1-800-FDA-1088. Where should I keep my medicine? This drug is given in a hospital or clinic and will not be stored at home. NOTE: This sheet is a summary. It may not cover all possible information. If you have questions about this medicine, talk to your doctor, pharmacist, or health care provider.    2016, Elsevier/Gold Standard. (2007-09-05 13:53:16)   Leucovorin injection What is this medicine? LEUCOVORIN (loo koe VOR in) is used to prevent or treat the harmful effects of some medicines. This medicine is used to treat anemia caused by a  low amount of folic acid in the body. It is also used with 5-fluorouracil (5-FU) to treat colon cancer. This medicine may be used for other purposes; ask your health care provider or pharmacist if you have questions. What should I tell my health care provider before I take this medicine? They need to know if you have any of these conditions: -anemia from low levels of vitamin B-12 in the blood -an unusual or allergic reaction to leucovorin, folic acid, other medicines, foods, dyes, or preservatives -pregnant or trying to get pregnant -breast-feeding How should I use this medicine? This medicine is for injection into a muscle or into a vein. It is given by a health care professional in a hospital or clinic setting. Talk to your pediatrician regarding the use of this medicine in  children. Special care may be needed. Overdosage: If you think you have taken too much of this medicine contact a poison control center or emergency room at once. NOTE: This medicine is only for you. Do not share this medicine with others. What if I miss a dose? This does not apply. What may interact with this medicine? -capecitabine -fluorouracil -phenobarbital -phenytoin -primidone -trimethoprim-sulfamethoxazole This list may not describe all possible interactions. Give your health care provider a list of all the medicines, herbs, non-prescription drugs, or dietary supplements you use. Also tell them if you smoke, drink alcohol, or use illegal drugs. Some items may interact with your medicine. What should I watch for while using this medicine? Your condition will be monitored carefully while you are receiving this medicine. This medicine may increase the side effects of 5-fluorouracil, 5-FU. Tell your doctor or health care professional if you have diarrhea or mouth sores that do not get better or that get worse. What side effects may I notice from receiving this medicine? Side effects that you should report to your doctor or health care professional as soon as possible: -allergic reactions like skin rash, itching or hives, swelling of the face, lips, or tongue -breathing problems -fever, infection -mouth sores -unusual bleeding or bruising -unusually weak or tired Side effects that usually do not require medical attention (report to your doctor or health care professional if they continue or are bothersome): -constipation or diarrhea -loss of appetite -nausea, vomiting This list may not describe all possible side effects. Call your doctor for medical advice about side effects. You may report side effects to FDA at 1-800-FDA-1088. Where should I keep my medicine? This drug is given in a hospital or clinic and will not be stored at home. NOTE: This sheet is a summary. It may not cover  all possible information. If you have questions about this medicine, talk to your doctor, pharmacist, or health care provider.    2016, Elsevier/Gold Standard. (2007-11-06 16:50:29)

## 2015-06-10 ENCOUNTER — Telehealth: Payer: Self-pay | Admitting: *Deleted

## 2015-06-10 LAB — CEA: CEA: 7.6 ng/mL — ABNORMAL HIGH (ref 0.0–4.7)

## 2015-06-10 LAB — CEA (PARALLEL TESTING): CEA: 5.9 ng/mL — ABNORMAL HIGH (ref 0.0–5.0)

## 2015-06-10 NOTE — Telephone Encounter (Signed)
Pt report "I'm doing great so far"  Pt denies fever/N/V/diarrhea; no mouth sores or rash noted; "I did have night sweat las night; but I'm staying hydrated"  Pt verbalized understanding to call if problems and confirmed next office visit 2/7; pump d/c 1/26

## 2015-06-10 NOTE — Telephone Encounter (Signed)
-----   Message from [place], RN sent at 06/09/2015  1:22 PM EST ----- Regarding: Dr. Benay Spice. 1st time Firi/Vectibix fu call Dr. Benay Spice. Pt first time Firi/Vectibix. FU phone call.

## 2015-06-11 ENCOUNTER — Ambulatory Visit (HOSPITAL_BASED_OUTPATIENT_CLINIC_OR_DEPARTMENT_OTHER): Payer: Medicaid Other

## 2015-06-11 VITALS — BP 117/75 | HR 103 | Temp 98.7°F | Resp 18

## 2015-06-11 DIAGNOSIS — C7800 Secondary malignant neoplasm of unspecified lung: Secondary | ICD-10-CM

## 2015-06-11 DIAGNOSIS — C20 Malignant neoplasm of rectum: Secondary | ICD-10-CM

## 2015-06-11 MED ORDER — SODIUM CHLORIDE 0.9 % IJ SOLN
10.0000 mL | INTRAMUSCULAR | Status: DC | PRN
Start: 2015-06-11 — End: 2015-08-26
  Administered 2015-06-11: 10 mL
  Filled 2015-06-11: qty 10

## 2015-06-11 MED ORDER — HEPARIN SOD (PORK) LOCK FLUSH 100 UNIT/ML IV SOLN
500.0000 [IU] | Freq: Once | INTRAVENOUS | Status: AC | PRN
  Administered 2015-06-11: 500 [IU]
  Filled 2015-06-11: qty 5

## 2015-06-11 NOTE — Patient Instructions (Signed)

## 2015-06-17 ENCOUNTER — Ambulatory Visit: Payer: Medicaid Other

## 2015-06-22 ENCOUNTER — Other Ambulatory Visit: Payer: Self-pay | Admitting: Oncology

## 2015-06-23 ENCOUNTER — Other Ambulatory Visit (HOSPITAL_BASED_OUTPATIENT_CLINIC_OR_DEPARTMENT_OTHER): Payer: Medicaid Other

## 2015-06-23 ENCOUNTER — Ambulatory Visit: Payer: Medicaid Other

## 2015-06-23 ENCOUNTER — Ambulatory Visit (HOSPITAL_BASED_OUTPATIENT_CLINIC_OR_DEPARTMENT_OTHER): Payer: Medicaid Other | Admitting: Oncology

## 2015-06-23 ENCOUNTER — Encounter: Payer: Self-pay | Admitting: *Deleted

## 2015-06-23 ENCOUNTER — Telehealth: Payer: Self-pay | Admitting: *Deleted

## 2015-06-23 ENCOUNTER — Encounter: Payer: Medicaid Other | Admitting: Nutrition

## 2015-06-23 ENCOUNTER — Telehealth: Payer: Self-pay | Admitting: Nurse Practitioner

## 2015-06-23 VITALS — BP 102/71 | HR 116 | Temp 98.8°F | Resp 18 | Ht 78.0 in | Wt 141.5 lb

## 2015-06-23 DIAGNOSIS — R634 Abnormal weight loss: Secondary | ICD-10-CM | POA: Diagnosis not present

## 2015-06-23 DIAGNOSIS — C7800 Secondary malignant neoplasm of unspecified lung: Secondary | ICD-10-CM

## 2015-06-23 DIAGNOSIS — D701 Agranulocytosis secondary to cancer chemotherapy: Secondary | ICD-10-CM

## 2015-06-23 DIAGNOSIS — C20 Malignant neoplasm of rectum: Secondary | ICD-10-CM

## 2015-06-23 DIAGNOSIS — R630 Anorexia: Secondary | ICD-10-CM

## 2015-06-23 LAB — COMPREHENSIVE METABOLIC PANEL
ALBUMIN: 3 g/dL — AB (ref 3.5–5.0)
ALK PHOS: 91 U/L (ref 40–150)
ALT: 10 U/L (ref 0–55)
ANION GAP: 11 meq/L (ref 3–11)
AST: 15 U/L (ref 5–34)
BILIRUBIN TOTAL: 0.53 mg/dL (ref 0.20–1.20)
BUN: 16.8 mg/dL (ref 7.0–26.0)
CALCIUM: 9.7 mg/dL (ref 8.4–10.4)
CO2: 26 meq/L (ref 22–29)
CREATININE: 1.1 mg/dL (ref 0.7–1.3)
Chloride: 98 mEq/L (ref 98–109)
EGFR: 74 mL/min/{1.73_m2} — ABNORMAL LOW (ref >90)
Glucose: 176 mg/dl — ABNORMAL HIGH (ref 70–140)
Potassium: 4.5 mEq/L (ref 3.5–5.1)
Sodium: 135 mEq/L — ABNORMAL LOW (ref 136–145)
TOTAL PROTEIN: 7.3 g/dL (ref 6.4–8.3)

## 2015-06-23 LAB — CBC WITH DIFFERENTIAL/PLATELET
BASO%: 0.8 % (ref 0.0–2.0)
Basophils Absolute: 0 10*3/uL (ref 0.0–0.1)
EOS ABS: 0 10*3/uL (ref 0.0–0.5)
EOS%: 0.8 % (ref 0.0–7.0)
HEMATOCRIT: 27.5 % — AB (ref 38.4–49.9)
HEMOGLOBIN: 8.4 g/dL — AB (ref 13.0–17.1)
LYMPH#: 0.1 10*3/uL — AB (ref 0.9–3.3)
LYMPH%: 10.9 % — ABNORMAL LOW (ref 14.0–49.0)
MCH: 24.3 pg — ABNORMAL LOW (ref 27.2–33.4)
MCHC: 30.5 g/dL — ABNORMAL LOW (ref 32.0–36.0)
MCV: 79.7 fL (ref 79.3–98.0)
MONO#: 0.1 10*3/uL (ref 0.1–0.9)
MONO%: 10.1 % (ref 0.0–14.0)
NEUT%: 77.4 % — ABNORMAL HIGH (ref 39.0–75.0)
NEUTROS ABS: 0.9 10*3/uL — AB (ref 1.5–6.5)
PLATELETS: 215 10*3/uL (ref 140–400)
RBC: 3.45 10*6/uL — ABNORMAL LOW (ref 4.20–5.82)
RDW: 15.3 % — ABNORMAL HIGH (ref 11.0–14.6)
WBC: 1.2 10*3/uL — AB (ref 4.0–10.3)

## 2015-06-23 LAB — MAGNESIUM: MAGNESIUM: 1.7 mg/dL (ref 1.5–2.5)

## 2015-06-23 MED ORDER — ALPRAZOLAM 1 MG PO TABS: ORAL_TABLET | ORAL | Status: DC

## 2015-06-23 MED ORDER — OXYCODONE HCL 5 MG PO TABS: ORAL_TABLET | ORAL | Status: DC

## 2015-06-23 NOTE — Progress Notes (Signed)
Oncology Nurse Navigator Documentation  Oncology Nurse Navigator Flowsheets 06/23/2015  Navigator Location [facility]  Navigator Encounter Type Follow-up Appt  Patient Visit Type MedOnc  Treatment Phase Treatment--held today due to neutropeina  Barriers/Navigation Needs No Questions;No Needs;No barriers at this time--Education  Interventions Educated on Neulasta injection-verba/printed  Acuity Level 1  Time Spent with Patient 30  Time Spent with Patient (Retired) -  Eating better, but weight still going down. Manages his ostomies without difficulty--pleased w/company providing his supplies. Ambulates w/walker in home. Stays home and sleeps and reads most of the time. 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 HIs significant other, Tye [place] lives w/him and provides excellent care and support. He denies any falls in the home.

## 2015-06-23 NOTE — Progress Notes (Signed)
[place] OFFICE PROGRESS NOTE   Diagnosis: Rectal cancer  INTERVAL HISTORY:   Her Hargens returns as scheduled. He completed a first treatment with FOLFIRI/panitumumab on 06/09/2015. No nausea/vomiting, rash, or diarrhea following chemotherapy. He reports a good appetite. He continues to have pain at the sacrum.  Objective:  Vital signs in last 24 hours:  Blood pressure 102/71, pulse 116, temperature 98.8 F (37.1 C), temperature source Oral, resp. rate 18, height 6\' 6"  (1.981 m), weight 141 lb 8 oz (64.184 kg), SpO2 100 %.    HEENT: No thrush or ulcer Resp: Distant breath sounds, no respiratory distress Cardio: Regular rate and rhythm GI: Fullness in the right upper quadrant superior to the urostomy site with associated tenderness, left lower quadrant colostomy Vascular: No leg edema  Skin: No rash   Portacath/PICC-without erythema  Lab Results:  Lab Results  Component Value Date   WBC 1.2* 06/23/2015   HGB 8.4* 06/23/2015   HCT 27.5* 06/23/2015   MCV 79.7 06/23/2015   PLT 215 06/23/2015   NEUTROABS 0.9* 06/23/2015      Lab Results  Component Value Date   CEA1 7.6* 06/09/2015     Medications: I have reviewed the patient's current medications.  Assessment/Plan: 1. Rectal cancer-large pelvic mass with a colonoscopy 10/11/2013 confirming an obstructing tumor at 9 cm from the anal verge  CTs of the chest, abdomen, and revealed a large complex pelvic mass, gas in the bladder, loculated gas posterior to the bladder, right hydronephrosis, and small bilateral indeterminant lung nodules.   Initiation of radiation and Xeloda 12/18/2013; completed 01/29/2014.  Restaging CT evaluation at [facility] showed decrease in size of the rectosigmoid mass from 14.6 x 10.5 cm to 9.5 x 8.6 cm. Post radiation changes. Moderate right hydronephrosis with a double-J ureteral stent in place and mild left hydronephrosis, unchanged. Pulmonary nodules unchanged.  Circumferential bladder wall thickening likely secondary to radiation. Rectosigmoid mass abuts the posterior bladder and involvement cannot be excluded.  Cycle 1 Capox beginning 03/13/2014  Cycle 2 CAPOX beginning 04/03/2014.  Cycle 3 CAPOX beginning 04/24/2014.  CT 05/13/2014 confirming a decrease in the rectal mass  Pelvic exoneration at [facility] on 06/24/2014 confirming a ypT4,ypN0 tumor. The anterior and posterior cervical frontal margins were less than 1 mm, tumor involved the bladder and left ureter. Surgically included an APR, cystectomy, ileal conduit, intraoperative radiation, and sacrectomy, and vertical rectus myocutaneous flap  Cycle 1 adjuvant CAPOX 08/25/2014  Cycle 2 adjuvant CAPOX 09/15/2014  Cycle 3 adjuvant CAPOX 10/06/2014  Cycle 4 adjuvant CAPOX 10/27/2014  CT abdomen/pelvis at [facility] 11/16/2014 with chronic right hydronephrosis, nodular collection of soft tissue in the right pelvis consistent with post surgical change, though recurrent disease cannot be excluded  CT at [facility] 02/09/2015 with increased fullness of the right renal pelvis and enlargement of a lung nodule  Biopsy of the right renal pelvis 02/13/2015 confirmed metastatic colorectal cancer  Staging PET scan 03/05/2015 consistent with multiple lung metastases, a hepatic abscess, and increased FDG uptake at the tip of the sacrum  Restaging CT scans 06/02/2015 showed mild progression of pulmonary nodules; resolution of right hepatic abscess; significant enlargement of a right renal mass; extensive surgical changes; increasing soft tissue fullness about the coccyx suspicious for direct involvement and/or osseous metastasis; ill definition of the right sacrum suspicious for direct tumor involvement and/or osseous metastasis.  Cycle 1 FOLFIRI/panitumumab 06/09/2015 2. Right hydronephrosis secondary to the obstructing pelvic mass, status post placement of a right percutaneous nephrostomy tube followed by  a right ureter  stent.  Persistent hydronephrosis with a new right kidney cystic lesion on a CT 05/13/2014  Right renal parenchymal abscess and bladder stone confirmed on a cystoscopy 05/13/2014, status post a cystolithalopaxy, right ureter stent exchange  Placement of a percutaneous right renal abscess drain on 05/15/2014  CT abdomen 05/20/2014 showed near-complete drainage of the right renal abscess. The drainage catheter has been removed.  He completed a 6 week course of Levaquin. 3. Microcytic anemia-likely iron deficiency anemia secondary to bleeding from the rectal and hematuria  4. Possible colovesical fistula-on the CT 10/09/2013 evaluated by Dr. Alinda Money, followup CT cystogram 10/18/2013 revealed no fistula.  5. Anorexia/weight loss secondary to #1. 6. Indeterminate lung nodules on a CT of the chest 10/09/2013. Stable on CT 03/03/2014. 7. Family history of pancreas and colon cancer. 8. 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 9. History of a Colovesical fistula. 10. Hospitalization 12/10/2013 through 12/12/2013 presenting with diarrhea. CT abdomen/pelvis showed an enlarging rectal mass filling almost the entire pelvis. Air noted in the bladder and renal collecting system. Linear collection of air noted along the anterior wall of the colorectal mass abutting the bladder felt to possibly represent a site of fistulization. Stool noted throughout the right colon. Transferred to [facility]. Neoadjuvant chemotherapy/radiation recommended. 11. Skin toxicity at the perineum related to radiation and Xeloda. 12. Pneumonia on the CT 05/20/2014. He completed a course of Levaquin. 13. Right greater than left leg and foot numbness following the sacrectomy 14. Sacrum pain following the APR/ sacrectomy, persistent pain in the sacrum with increased FDG activity at the tip of the sacrum on a PET scan 03/05/2015 15. Enterococcus faecalis urinary tract infection and C. difficile colitis 11/16/2014 16. Hepatic abscess on the PET scan  03/05/2015-status post drainage followed by a course of antibiotics 17. Neutropenia secondary to chemotherapy-chemotherapy held 06/23/2015, Neulasta added with cycle 2 FOLFIRI/panitumumab  Disposition:  Mr. Plese appears stable. He tolerated the first cycle of FOLFIRI/panitumumab well. He has developed moderate neutropenia. He will contact us for a fever. He will be scheduled for cycle 2 FOLFIRI/panitumumab on 06/30/2015. Neulasta will be added with cycle 2. We reviewed the potential toxicities associated with Neulasta. He agrees to proceed.  Betsy Coder, MD  06/23/2015  10:34 AM

## 2015-06-23 NOTE — Telephone Encounter (Signed)
Per staff message and POF I have scheduled appts. Advised scheduler of appts and to move labs. JMW  

## 2015-06-23 NOTE — Telephone Encounter (Signed)
per pof to sch pt appt-sent MW email to sch trmt-pt tog et updated copy of avs b4 leaving °

## 2015-06-29 ENCOUNTER — Encounter: Payer: Self-pay | Admitting: Pharmacist

## 2015-06-30 ENCOUNTER — Other Ambulatory Visit: Payer: Self-pay | Admitting: Oncology

## 2015-06-30 ENCOUNTER — Ambulatory Visit (HOSPITAL_BASED_OUTPATIENT_CLINIC_OR_DEPARTMENT_OTHER): Payer: Medicaid Other

## 2015-06-30 ENCOUNTER — Other Ambulatory Visit (HOSPITAL_BASED_OUTPATIENT_CLINIC_OR_DEPARTMENT_OTHER): Payer: Medicaid Other

## 2015-06-30 ENCOUNTER — Other Ambulatory Visit: Payer: Medicaid Other

## 2015-06-30 VITALS — BP 111/74 | HR 86 | Temp 98.2°F

## 2015-06-30 DIAGNOSIS — Z5111 Encounter for antineoplastic chemotherapy: Secondary | ICD-10-CM

## 2015-06-30 DIAGNOSIS — C7800 Secondary malignant neoplasm of unspecified lung: Secondary | ICD-10-CM

## 2015-06-30 DIAGNOSIS — Z5112 Encounter for antineoplastic immunotherapy: Secondary | ICD-10-CM

## 2015-06-30 DIAGNOSIS — C20 Malignant neoplasm of rectum: Secondary | ICD-10-CM

## 2015-06-30 LAB — COMPREHENSIVE METABOLIC PANEL WITH GFR
ALT: 12 U/L (ref 0–55)
AST: 23 U/L (ref 5–34)
Albumin: 2.6 g/dL — ABNORMAL LOW (ref 3.5–5.0)
Alkaline Phosphatase: 82 U/L (ref 40–150)
Anion Gap: 9 meq/L (ref 3–11)
BUN: 12.3 mg/dL (ref 7.0–26.0)
CO2: 27 meq/L (ref 22–29)
Calcium: 9.4 mg/dL (ref 8.4–10.4)
Chloride: 100 meq/L (ref 98–109)
Creatinine: 1.1 mg/dL (ref 0.7–1.3)
EGFR: 74 ml/min/1.73 m2 — ABNORMAL LOW (ref >90)
Glucose: 121 mg/dL (ref 70–140)
Potassium: 4.8 meq/L (ref 3.5–5.1)
Sodium: 136 meq/L (ref 136–145)
Total Bilirubin: 0.33 mg/dL (ref 0.20–1.20)
Total Protein: 6.8 g/dL (ref 6.4–8.3)

## 2015-06-30 LAB — CBC WITH DIFFERENTIAL/PLATELET
BASO%: 0.4 % (ref 0.0–2.0)
Basophils Absolute: 0 10e3/uL (ref 0.0–0.1)
EOS%: 0.5 % (ref 0.0–7.0)
Eosinophils Absolute: 0 10e3/uL (ref 0.0–0.5)
HCT: 24.4 % — ABNORMAL LOW (ref 38.4–49.9)
HGB: 7.6 g/dL — ABNORMAL LOW (ref 13.0–17.1)
LYMPH%: 5.5 % — ABNORMAL LOW (ref 14.0–49.0)
MCH: 24.3 pg — ABNORMAL LOW (ref 27.2–33.4)
MCHC: 31.3 g/dL — ABNORMAL LOW (ref 32.0–36.0)
MCV: 77.6 fL — ABNORMAL LOW (ref 79.3–98.0)
MONO#: 0.3 10e3/uL (ref 0.1–0.9)
MONO%: 8.6 % (ref 0.0–14.0)
NEUT#: 3 10e3/uL (ref 1.5–6.5)
NEUT%: 85 % — ABNORMAL HIGH (ref 39.0–75.0)
Platelets: 300 10e3/uL (ref 140–400)
RBC: 3.14 10e6/uL — ABNORMAL LOW (ref 4.20–5.82)
RDW: 17.1 % — ABNORMAL HIGH (ref 11.0–14.6)
WBC: 3.6 10e3/uL — ABNORMAL LOW (ref 4.0–10.3)
lymph#: 0.2 10e3/uL — ABNORMAL LOW (ref 0.9–3.3)

## 2015-06-30 LAB — MAGNESIUM: Magnesium: 1.8 mg/dL (ref 1.5–2.5)

## 2015-06-30 MED ORDER — ATROPINE SULFATE 1 MG/ML IJ SOLN
0.5000 mg | Freq: Once | INTRAMUSCULAR | Status: AC | PRN
  Administered 2015-06-30: 0.5 mg via INTRAVENOUS

## 2015-06-30 MED ORDER — PALONOSETRON HCL INJECTION 0.25 MG/5ML
0.2500 mg | Freq: Once | INTRAVENOUS | Status: AC
  Administered 2015-06-30: 0.25 mg via INTRAVENOUS

## 2015-06-30 MED ORDER — ATROPINE SULFATE 1 MG/ML IJ SOLN
INTRAMUSCULAR | Status: AC
  Filled 2015-06-30: qty 1

## 2015-06-30 MED ORDER — LEUCOVORIN CALCIUM INJECTION 350 MG
398.0000 mg/m2 | Freq: Once | INTRAMUSCULAR | Status: AC
  Administered 2015-06-30: 760 mg via INTRAVENOUS
  Filled 2015-06-30: qty 38

## 2015-06-30 MED ORDER — SODIUM CHLORIDE 0.9 % IV SOLN
2400.0000 mg/m2 | INTRAVENOUS | Status: DC
  Administered 2015-06-30: 4600 mg via INTRAVENOUS
  Filled 2015-06-30: qty 92

## 2015-06-30 MED ORDER — SODIUM CHLORIDE 0.9 % IV SOLN
Freq: Once | INTRAVENOUS | Status: AC
  Administered 2015-06-30: 13:00:00 via INTRAVENOUS

## 2015-06-30 MED ORDER — PALONOSETRON HCL INJECTION 0.25 MG/5ML
INTRAVENOUS | Status: AC
  Filled 2015-06-30: qty 5

## 2015-06-30 MED ORDER — SODIUM CHLORIDE 0.9 % IV SOLN
10.0000 mg | Freq: Once | INTRAVENOUS | Status: AC
  Administered 2015-06-30: 10 mg via INTRAVENOUS
  Filled 2015-06-30: qty 1

## 2015-06-30 MED ORDER — SODIUM CHLORIDE 0.9 % IV SOLN
6.0000 mg/kg | Freq: Once | INTRAVENOUS | Status: AC
  Administered 2015-06-30: 400 mg via INTRAVENOUS
  Filled 2015-06-30: qty 20

## 2015-06-30 MED ORDER — FLUOROURACIL CHEMO INJECTION 2.5 GM/50ML
400.0000 mg/m2 | Freq: Once | INTRAVENOUS | Status: AC
  Administered 2015-06-30: 750 mg via INTRAVENOUS
  Filled 2015-06-30: qty 15

## 2015-06-30 MED ORDER — DEXTROSE 5 % IV SOLN
178.0000 mg/m2 | Freq: Once | INTRAVENOUS | Status: AC
  Administered 2015-06-30: 340 mg via INTRAVENOUS
  Filled 2015-06-30: qty 5.67

## 2015-06-30 NOTE — Progress Notes (Signed)
Ok to treat per Dr. Sherrill. 

## 2015-06-30 NOTE — Patient Instructions (Signed)
[facility] Discharge Instructions for Patients Receiving Chemotherapy  Today you received the following chemotherapy agents Vectibix/Irinotecan/Leucovorin/5FU.  To help prevent nausea and vomiting after your treatment, we encourage you to take your nausea medication as prescribed.   If you develop nausea and vomiting that is not controlled by your nausea medication, call the clinic.   BELOW ARE SYMPTOMS THAT SHOULD BE REPORTED IMMEDIATELY:  *FEVER GREATER THAN 100.5 F  *CHILLS WITH OR WITHOUT FEVER  NAUSEA AND VOMITING THAT IS NOT CONTROLLED WITH YOUR NAUSEA MEDICATION  *UNUSUAL SHORTNESS OF BREATH  *UNUSUAL BRUISING OR BLEEDING  TENDERNESS IN MOUTH AND THROAT WITH OR WITHOUT PRESENCE OF ULCERS  *URINARY PROBLEMS  *BOWEL PROBLEMS  UNUSUAL RASH Items with * indicate a potential emergency and should be followed up as soon as possible.  Feel free to call the clinic you have any questions or concerns. The clinic phone number is [phone].  Please show the [place] at check-in to the Emergency Department and triage nurse.

## 2015-07-02 ENCOUNTER — Ambulatory Visit (HOSPITAL_BASED_OUTPATIENT_CLINIC_OR_DEPARTMENT_OTHER): Payer: Medicaid Other

## 2015-07-02 ENCOUNTER — Ambulatory Visit: Payer: Medicaid Other

## 2015-07-02 VITALS — BP 91/61 | HR 81 | Temp 98.5°F

## 2015-07-02 DIAGNOSIS — C7800 Secondary malignant neoplasm of unspecified lung: Secondary | ICD-10-CM

## 2015-07-02 DIAGNOSIS — C20 Malignant neoplasm of rectum: Secondary | ICD-10-CM | POA: Diagnosis present

## 2015-07-02 MED ORDER — SODIUM CHLORIDE 0.9 % IJ SOLN
10.0000 mL | INTRAMUSCULAR | Status: DC | PRN
  Administered 2015-07-02: 10 mL
  Filled 2015-07-02: qty 10

## 2015-07-02 MED ORDER — PEGFILGRASTIM INJECTION 6 MG/0.6ML ~~LOC~~
6.0000 mg | PREFILLED_SYRINGE | Freq: Once | SUBCUTANEOUS | Status: AC
  Administered 2015-07-02: 6 mg via SUBCUTANEOUS
  Filled 2015-07-02: qty 0.6

## 2015-07-02 MED ORDER — HEPARIN SOD (PORK) LOCK FLUSH 100 UNIT/ML IV SOLN
500.0000 [IU] | Freq: Once | INTRAVENOUS | Status: AC | PRN
  Administered 2015-07-02: 500 [IU]
  Filled 2015-07-02: qty 5

## 2015-07-02 NOTE — Progress Notes (Signed)
Neulasta injection given by flush nurse after home infusion pump disconnected. 

## 2015-07-12 ENCOUNTER — Other Ambulatory Visit: Payer: Self-pay | Admitting: Oncology

## 2015-07-14 ENCOUNTER — Ambulatory Visit: Payer: Medicaid Other | Admitting: Nutrition

## 2015-07-14 ENCOUNTER — Ambulatory Visit (HOSPITAL_BASED_OUTPATIENT_CLINIC_OR_DEPARTMENT_OTHER): Payer: Medicaid Other

## 2015-07-14 ENCOUNTER — Other Ambulatory Visit (HOSPITAL_BASED_OUTPATIENT_CLINIC_OR_DEPARTMENT_OTHER): Payer: Medicaid Other

## 2015-07-14 ENCOUNTER — Ambulatory Visit (HOSPITAL_BASED_OUTPATIENT_CLINIC_OR_DEPARTMENT_OTHER): Payer: Medicaid Other | Admitting: Nurse Practitioner

## 2015-07-14 ENCOUNTER — Telehealth: Payer: Self-pay | Admitting: Oncology

## 2015-07-14 VITALS — BP 118/80 | HR 105 | Temp 98.2°F | Resp 17 | Ht 78.0 in | Wt 141.9 lb

## 2015-07-14 DIAGNOSIS — Z5111 Encounter for antineoplastic chemotherapy: Secondary | ICD-10-CM

## 2015-07-14 DIAGNOSIS — Z5112 Encounter for antineoplastic immunotherapy: Secondary | ICD-10-CM | POA: Diagnosis present

## 2015-07-14 DIAGNOSIS — C20 Malignant neoplasm of rectum: Secondary | ICD-10-CM

## 2015-07-14 DIAGNOSIS — C7800 Secondary malignant neoplasm of unspecified lung: Secondary | ICD-10-CM | POA: Diagnosis not present

## 2015-07-14 LAB — COMPREHENSIVE METABOLIC PANEL
ALBUMIN: 3.1 g/dL — AB (ref 3.5–5.0)
ALK PHOS: 107 U/L (ref 40–150)
AST: 12 U/L (ref 5–34)
Anion Gap: 9 mEq/L (ref 3–11)
BILIRUBIN TOTAL: 0.42 mg/dL (ref 0.20–1.20)
BUN: 9.3 mg/dL (ref 7.0–26.0)
CALCIUM: 9.8 mg/dL (ref 8.4–10.4)
CO2: 28 mEq/L (ref 22–29)
CREATININE: 1.1 mg/dL (ref 0.7–1.3)
Chloride: 101 mEq/L (ref 98–109)
EGFR: 78 mL/min/{1.73_m2} — ABNORMAL LOW (ref >90)
Glucose: 130 mg/dl (ref 70–140)
Potassium: 4.5 mEq/L (ref 3.5–5.1)
Sodium: 138 mEq/L (ref 136–145)
TOTAL PROTEIN: 7.1 g/dL (ref 6.4–8.3)

## 2015-07-14 LAB — CBC WITH DIFFERENTIAL/PLATELET
BASO%: 0.3 % (ref 0.0–2.0)
Basophils Absolute: 0 10*3/uL (ref 0.0–0.1)
EOS%: 0.3 % (ref 0.0–7.0)
Eosinophils Absolute: 0 10*3/uL (ref 0.0–0.5)
HEMATOCRIT: 32.1 % — AB (ref 38.4–49.9)
HEMOGLOBIN: 9.5 g/dL — AB (ref 13.0–17.1)
LYMPH#: 0.4 10*3/uL — AB (ref 0.9–3.3)
LYMPH%: 4.8 % — ABNORMAL LOW (ref 14.0–49.0)
MCH: 25.3 pg — ABNORMAL LOW (ref 27.2–33.4)
MCHC: 29.6 g/dL — ABNORMAL LOW (ref 32.0–36.0)
MCV: 85.6 fL (ref 79.3–98.0)
MONO#: 0.4 10*3/uL (ref 0.1–0.9)
MONO%: 5.1 % (ref 0.0–14.0)
NEUT#: 6.5 10*3/uL (ref 1.5–6.5)
NEUT%: 89.5 % — ABNORMAL HIGH (ref 39.0–75.0)
Platelets: 269 10*3/uL (ref 140–400)
RBC: 3.75 10*6/uL — ABNORMAL LOW (ref 4.20–5.82)
RDW: 20 % — AB (ref 11.0–14.6)
WBC: 7.2 10*3/uL (ref 4.0–10.3)

## 2015-07-14 LAB — MAGNESIUM: MAGNESIUM: 1.7 mg/dL (ref 1.5–2.5)

## 2015-07-14 MED ORDER — SODIUM CHLORIDE 0.9 % IV SOLN
Freq: Once | INTRAVENOUS | Status: AC
  Administered 2015-07-14: 13:00:00 via INTRAVENOUS

## 2015-07-14 MED ORDER — SODIUM CHLORIDE 0.9 % IV SOLN
6.0000 mg/kg | Freq: Once | INTRAVENOUS | Status: AC
  Administered 2015-07-14: 400 mg via INTRAVENOUS
  Filled 2015-07-14: qty 20

## 2015-07-14 MED ORDER — PALONOSETRON HCL INJECTION 0.25 MG/5ML
INTRAVENOUS | Status: AC
  Filled 2015-07-14: qty 5

## 2015-07-14 MED ORDER — ATROPINE SULFATE 1 MG/ML IJ SOLN
INTRAMUSCULAR | Status: AC
  Filled 2015-07-14: qty 1

## 2015-07-14 MED ORDER — LEUCOVORIN CALCIUM INJECTION 350 MG
398.0000 mg/m2 | Freq: Once | INTRAVENOUS | Status: AC
  Administered 2015-07-14: 760 mg via INTRAVENOUS
  Filled 2015-07-14: qty 38

## 2015-07-14 MED ORDER — FLUOROURACIL CHEMO INJECTION 2.5 GM/50ML
400.0000 mg/m2 | Freq: Once | INTRAVENOUS | Status: AC
  Administered 2015-07-14: 750 mg via INTRAVENOUS
  Filled 2015-07-14: qty 15

## 2015-07-14 MED ORDER — PALONOSETRON HCL INJECTION 0.25 MG/5ML
0.2500 mg | Freq: Once | INTRAVENOUS | Status: AC
  Administered 2015-07-14: 0.25 mg via INTRAVENOUS

## 2015-07-14 MED ORDER — ATROPINE SULFATE 1 MG/ML IJ SOLN
0.5000 mg | Freq: Once | INTRAMUSCULAR | Status: AC | PRN
  Administered 2015-07-14: 0.5 mg via INTRAVENOUS

## 2015-07-14 MED ORDER — DEXAMETHASONE SODIUM PHOSPHATE 100 MG/10ML IJ SOLN
10.0000 mg | Freq: Once | INTRAMUSCULAR | Status: AC
  Administered 2015-07-14: 10 mg via INTRAVENOUS
  Filled 2015-07-14: qty 1

## 2015-07-14 MED ORDER — FLUOROURACIL CHEMO INJECTION 5 GM/100ML
2400.0000 mg/m2 | INTRAVENOUS | Status: DC
  Administered 2015-07-14: 4600 mg via INTRAVENOUS
  Filled 2015-07-14: qty 92

## 2015-07-14 MED ORDER — DEXTROSE 5 % IV SOLN
178.0000 mg/m2 | Freq: Once | INTRAVENOUS | Status: AC
  Administered 2015-07-14: 340 mg via INTRAVENOUS
  Filled 2015-07-14: qty 5.67

## 2015-07-14 NOTE — Telephone Encounter (Signed)
appt made and avs printed °

## 2015-07-14 NOTE — Progress Notes (Signed)
Brief nutrition follow-up completed with patient who was last seen December 2015. Patient is receiving chemotherapy for rectal cancer. Patient reports pitting edema has resolved. Weight is relatively stable and documented as 141.5 pounds. He reports good appetite and denies nausea, vomiting, diarrhea, and constipation.  Nutrition diagnosis: Unintended weight loss improved.  Intervention: Provided supportive listening and encouraged patient to continue increased oral intake to promote weight maintenance. Encouraged patient to contact me if he develops questions or concerns. Patient expresses appreciation.  No follow-up has been scheduled.  **Disclaimer: This note was dictated with voice recognition software. Similar sounding words can inadvertently be transcribed and this note may contain transcription errors which may not have been corrected upon publication of note.**

## 2015-07-14 NOTE — Progress Notes (Signed)
[place] OFFICE PROGRESS NOTE   Diagnosis:  Rectal cancer  INTERVAL HISTORY:   Grant [name] returns as scheduled. He completed cycle 2 FOLFIRI/PANITUMUMAB 06/30/2015 with Neulasta support. He denies nausea/vomiting. No mouth sores. No diarrhea. No rash. He does note that his skin is "dry". Pain at the sacrum is unchanged. Main complaint is fatigue/malaise.  Objective:  Vital signs in last 24 hours:  Blood pressure 118/80, pulse 105, temperature 98.2 F (36.8 C), temperature source Oral, resp. rate 17, height 6\' 6"  (1.981 m), weight 141 lb 14.4 oz (64.365 kg), SpO2 100 %.    HEENT: No thrush or ulcers. Resp: Lungs clear bilaterally. Distant breath sounds. Cardio: Regular rate and rhythm. GI: Persistent fullness right upper quadrant superior to the urostomy site. Left lower quadrant colostomy. Vascular: No leg edema. Skin: No rash.    Lab Results:  Lab Results  Component Value Date   WBC 7.2 07/14/2015   HGB 9.5* 07/14/2015   HCT 32.1* 07/14/2015   MCV 85.6 07/14/2015   PLT 269 07/14/2015   NEUTROABS 6.5 07/14/2015    Imaging:  No results found.  Medications: I have reviewed the patient's current medications.  Assessment/Plan: 1. Rectal cancer-large pelvic mass with a colonoscopy 10/11/2013 confirming an obstructing tumor at 9 cm from the anal verge  CTs of the chest, abdomen, and revealed a large complex pelvic mass, gas in the bladder, loculated gas posterior to the bladder, right hydronephrosis, and small bilateral indeterminant lung nodules.   Initiation of radiation and Xeloda 12/18/2013; completed 01/29/2014.  Restaging CT evaluation at [facility] showed decrease in size of the rectosigmoid mass from 14.6 x 10.5 cm to 9.5 x 8.6 cm. Post radiation changes. Moderate right hydronephrosis with a double-J ureteral stent in place and mild left hydronephrosis, unchanged. Pulmonary nodules unchanged. Circumferential bladder wall thickening likely secondary  to radiation. Rectosigmoid mass abuts the posterior bladder and involvement cannot be excluded.  Cycle 1 Capox beginning 03/13/2014  Cycle 2 CAPOX beginning 04/03/2014.  Cycle 3 CAPOX beginning 04/24/2014.  CT 05/13/2014 confirming a decrease in the rectal mass  Pelvic exoneration at [facility] on 06/24/2014 confirming a ypT4,ypN0 tumor. The anterior and posterior cervical frontal margins were less than 1 mm, tumor involved the bladder and left ureter. Surgically included an APR, cystectomy, ileal conduit, intraoperative radiation, and sacrectomy, and vertical rectus myocutaneous flap  Cycle 1 adjuvant CAPOX 08/25/2014  Cycle 2 adjuvant CAPOX 09/15/2014  Cycle 3 adjuvant CAPOX 10/06/2014  Cycle 4 adjuvant CAPOX 10/27/2014  CT abdomen/pelvis at [facility] 11/16/2014 with chronic right hydronephrosis, nodular collection of soft tissue in the right pelvis consistent with post surgical change, though recurrent disease cannot be excluded  CT at [facility] 02/09/2015 with increased fullness of the right renal pelvis and enlargement of a lung nodule  Biopsy of the right renal pelvis 02/13/2015 confirmed metastatic colorectal cancer  Staging PET scan 03/05/2015 consistent with multiple lung metastases, a hepatic abscess, and increased FDG uptake at the tip of the sacrum  Restaging CT scans 06/02/2015 showed mild progression of pulmonary nodules; resolution of right hepatic abscess; significant enlargement of a right renal mass; extensive surgical changes; increasing soft tissue fullness about the coccyx suspicious for direct involvement and/or osseous metastasis; ill definition of the right sacrum suspicious for direct tumor involvement and/or osseous metastasis.  Cycle 1 FOLFIRI/panitumumab 06/09/2015  Cycle 2 FOLFIRI/PANITUMUMAB 06/30/2015, Neulasta added  Cycle 3 FOLFIRI/PANITUMUMAB 07/14/2015, with Neulasta 2. Right hydronephrosis secondary to the obstructing pelvic mass, status post placement of a right  percutaneous nephrostomy tube followed by a right ureter stent.  Persistent hydronephrosis with a new right kidney cystic lesion on a CT 05/13/2014  Right renal parenchymal abscess and bladder stone confirmed on a cystoscopy 05/13/2014, status post a cystolithalopaxy, right ureter stent exchange  Placement of a percutaneous right renal abscess drain on 05/15/2014  CT abdomen 05/20/2014 showed near-complete drainage of the right renal abscess. The drainage catheter has been removed.  He completed a 6 week course of Levaquin. 3. Microcytic anemia-likely iron deficiency anemia secondary to bleeding from the rectal and hematuria  4. Possible colovesical fistula-on the CT 10/09/2013 evaluated by Dr. Alinda Money, followup CT cystogram 10/18/2013 revealed no fistula.  5. Anorexia/weight loss secondary to #1. 6. Indeterminate lung nodules on a CT of the chest 10/09/2013. Stable on CT 03/03/2014. 7. Family history of pancreas and colon cancer. 8. 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 9. History of a Colovesical fistula. 10. Hospitalization 12/10/2013 through 12/12/2013 presenting with diarrhea. CT abdomen/pelvis showed an enlarging rectal mass filling almost the entire pelvis. Air noted in the bladder and renal collecting system. Linear collection of air noted along the anterior wall of the colorectal mass abutting the bladder felt to possibly represent a site of fistulization. Stool noted throughout the right colon. Transferred to [facility]. Neoadjuvant chemotherapy/radiation recommended. 11. Skin toxicity at the perineum related to radiation and Xeloda. 12. Pneumonia on the CT 05/20/2014. He completed a course of Levaquin. 13. Right greater than left leg and foot numbness following the sacrectomy 14. Sacrum pain following the APR/ sacrectomy, persistent pain in the sacrum with increased FDG activity at the tip of the sacrum on a PET scan 03/05/2015 15. Enterococcus faecalis urinary tract infection and C. difficile  colitis 11/16/2014 16. Hepatic abscess on the PET scan 03/05/2015-status post drainage followed by a course of antibiotics 17. Neutropenia secondary to chemotherapy-chemotherapy held 06/23/2015, Neulasta added with cycle 2 FOLFIRI/panitumumab    Disposition: Grant [name] appears stable. He has completed 2 cycles of FOLFIRI/PANITUMUMAB. Plan to proceed with cycle 3 today as scheduled. He will return for a follow-up visit and cycle 4 in 2 weeks. He will contact the office in the interim with any problems.    Ned Card ANP/GNP-BC   07/14/2015  11:27 AM

## 2015-07-14 NOTE — Patient Instructions (Signed)
[facility] Discharge Instructions for Patients Receiving Chemotherapy  Today you received the following chemotherapy agents Vectibix/Irinotecan/Leucovorin/5FU.  To help prevent nausea and vomiting after your treatment, we encourage you to take your nausea medication as prescribed.   If you develop nausea and vomiting that is not controlled by your nausea medication, call the clinic.   BELOW ARE SYMPTOMS THAT SHOULD BE REPORTED IMMEDIATELY:  *FEVER GREATER THAN 100.5 F  *CHILLS WITH OR WITHOUT FEVER  NAUSEA AND VOMITING THAT IS NOT CONTROLLED WITH YOUR NAUSEA MEDICATION  *UNUSUAL SHORTNESS OF BREATH  *UNUSUAL BRUISING OR BLEEDING  TENDERNESS IN MOUTH AND THROAT WITH OR WITHOUT PRESENCE OF ULCERS  *URINARY PROBLEMS  *BOWEL PROBLEMS  UNUSUAL RASH Items with * indicate a potential emergency and should be followed up as soon as possible.  Feel free to call the clinic you have any questions or concerns. The clinic phone number is [phone].  Please show the [place] at check-in to the Emergency Department and triage nurse.

## 2015-07-16 ENCOUNTER — Ambulatory Visit (HOSPITAL_BASED_OUTPATIENT_CLINIC_OR_DEPARTMENT_OTHER): Payer: Medicaid Other

## 2015-07-16 ENCOUNTER — Ambulatory Visit: Payer: Medicaid Other

## 2015-07-16 VITALS — BP 128/78 | HR 1 | Temp 98.2°F | Resp 18

## 2015-07-16 DIAGNOSIS — C20 Malignant neoplasm of rectum: Secondary | ICD-10-CM | POA: Diagnosis present

## 2015-07-16 DIAGNOSIS — Z5189 Encounter for other specified aftercare: Secondary | ICD-10-CM | POA: Diagnosis not present

## 2015-07-16 DIAGNOSIS — C7800 Secondary malignant neoplasm of unspecified lung: Secondary | ICD-10-CM | POA: Diagnosis not present

## 2015-07-16 MED ORDER — PEGFILGRASTIM INJECTION 6 MG/0.6ML ~~LOC~~
6.0000 mg | PREFILLED_SYRINGE | Freq: Once | SUBCUTANEOUS | Status: AC
  Administered 2015-07-16: 6 mg via SUBCUTANEOUS
  Filled 2015-07-16: qty 0.6

## 2015-07-16 MED ORDER — SODIUM CHLORIDE 0.9 % IJ SOLN
10.0000 mL | INTRAMUSCULAR | Status: DC | PRN
  Administered 2015-07-16: 10 mL
  Filled 2015-07-16: qty 10

## 2015-07-16 MED ORDER — HEPARIN SOD (PORK) LOCK FLUSH 100 UNIT/ML IV SOLN
500.0000 [IU] | Freq: Once | INTRAVENOUS | Status: AC | PRN
  Administered 2015-07-16: 500 [IU]
  Filled 2015-07-16: qty 5

## 2015-07-16 NOTE — Patient Instructions (Signed)

## 2015-07-16 NOTE — Progress Notes (Signed)
Neulasta injection given by flush after home infusion pump discontinued

## 2015-07-20 IMAGING — US US RENAL
1 series · 14 of 25 positions shown · non-contrast
Comparison: CT 05/12/2014

CLINICAL DATA: Renal abscess, hydronephrosis. Recent cystoscopic
exchange of occluded ureteral stent.

EXAM:
RENAL/URINARY TRACT ULTRASOUND COMPLETE

[Series 1: us renal · 0.21mm/px · 14 of 39 slices shown]
[im 1/39]
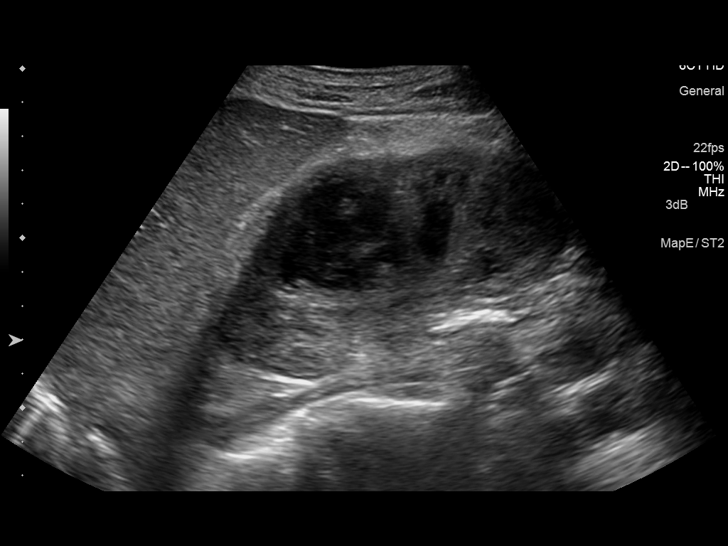
[im 4/39]
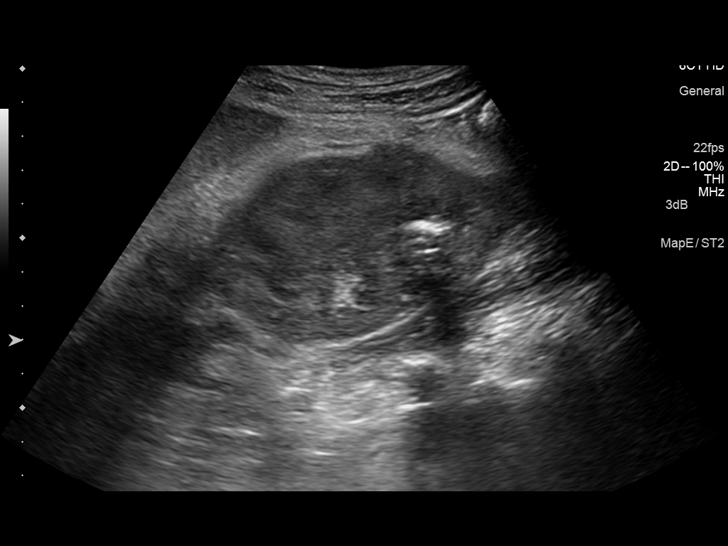
[im 7/39]
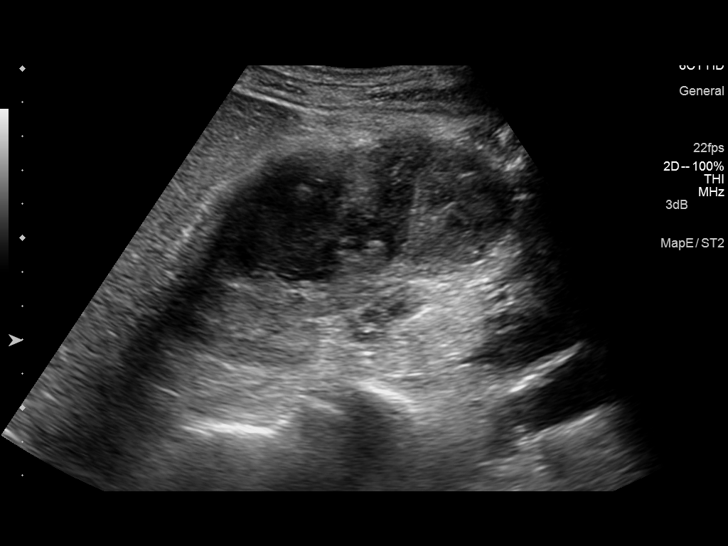
[im 10/39]
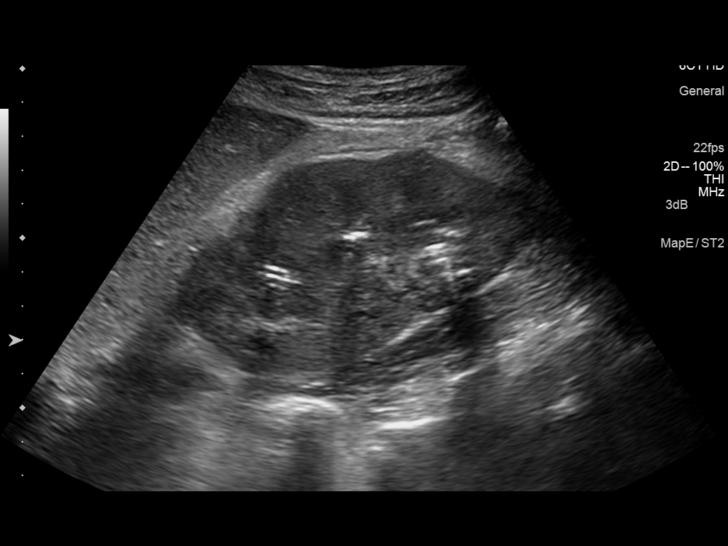
[im 13/39]
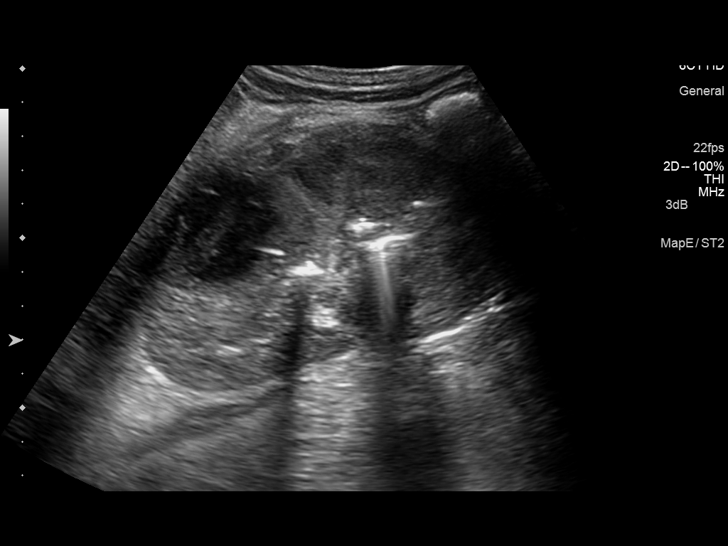
[im 15/39]
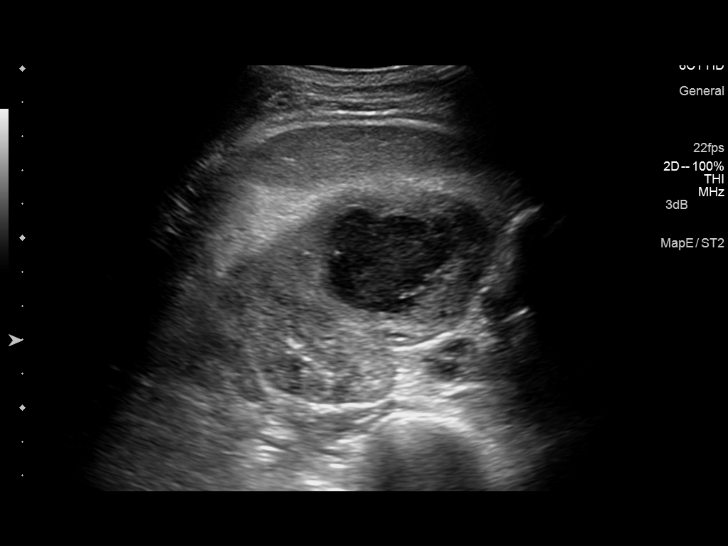
[im 18/39]
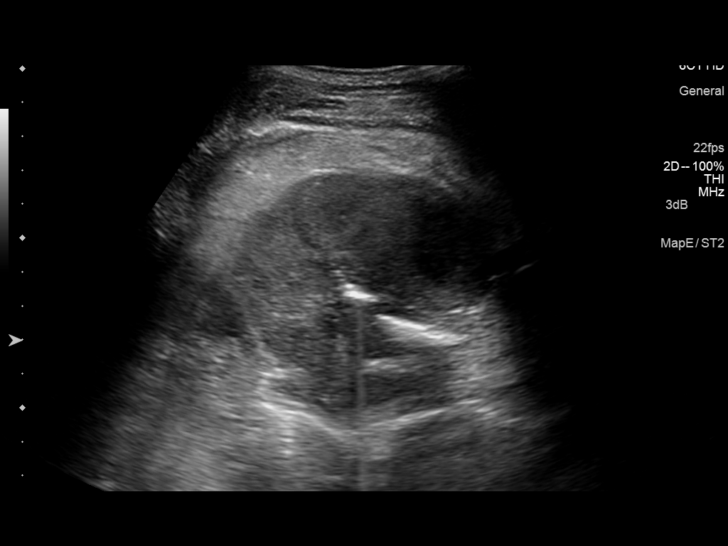
[im 21/39]
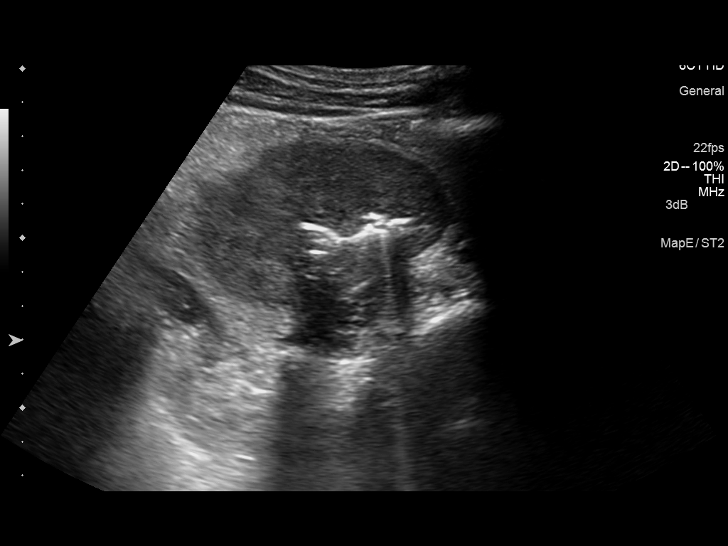
[im 24/39]
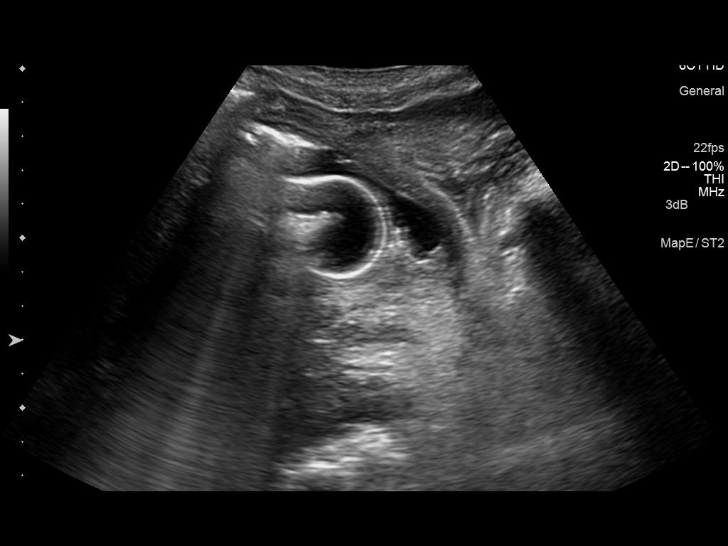
[im 26/39]
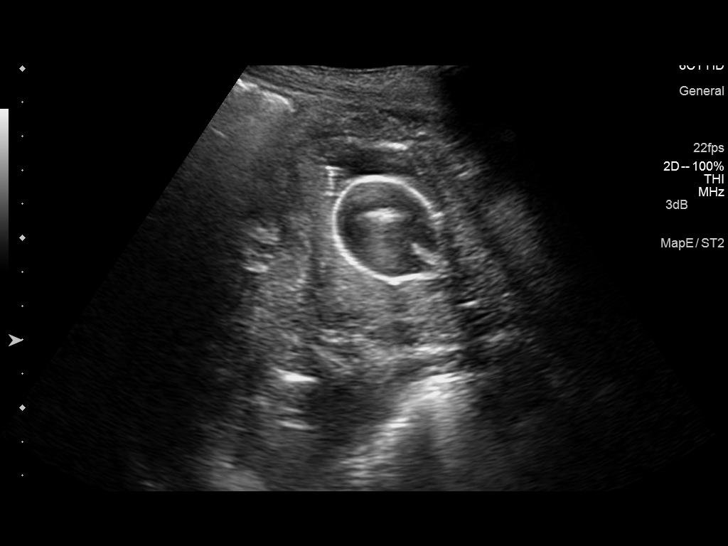
[im 29/39]
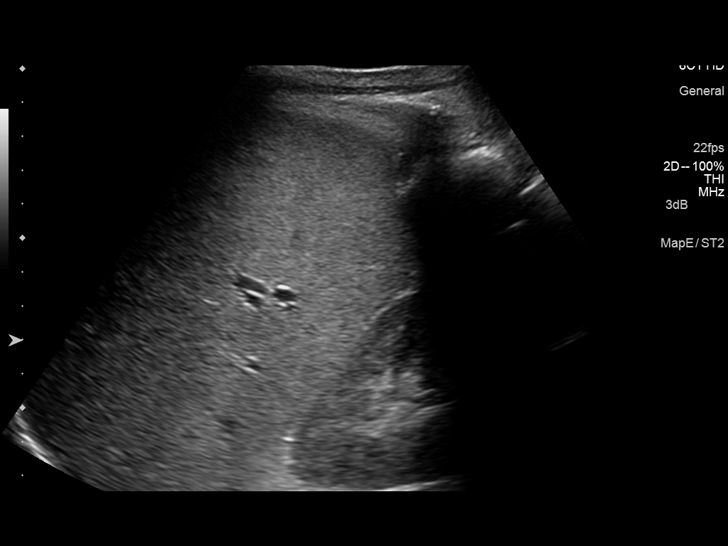
[im 32/39]
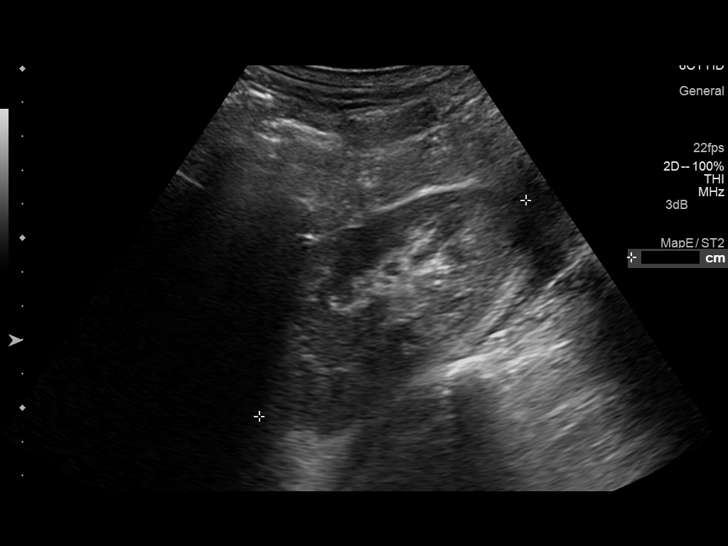
[im 35/39]
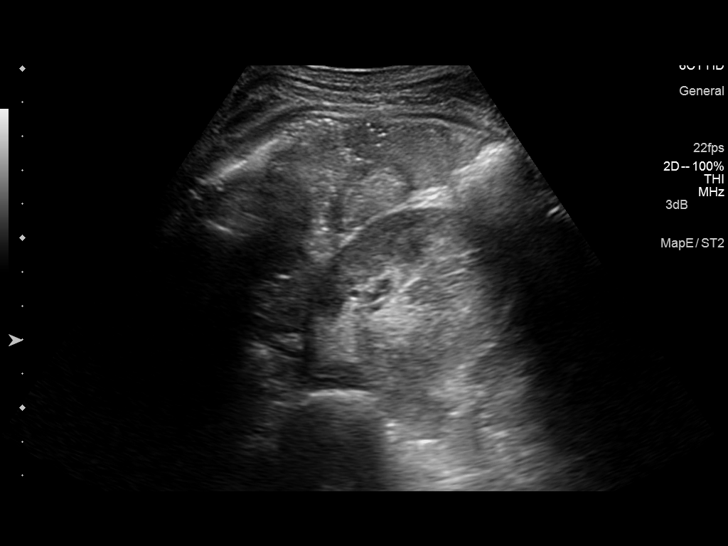
[im 39/39]
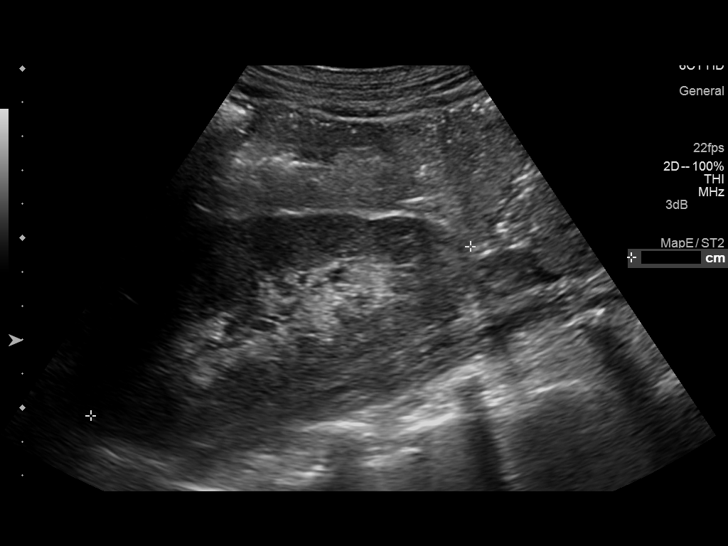

[14 of 25 positions shown; findings below may reference images not displayed]

FINDINGS: Right Kidney:

Length: 11 cm. There is a hypoechoic somewhat lobular 66 x 43 x 48
mm process in the mid upper pole right kidney corresponding to the
low-attenuation collection seen on prior CT, not appreciably
decompressed. Interval resolution of hydronephrosis. Echogenic
regions in the collecting system may represent the double-J stent or
gas from recent procedure.

Left Kidney:

Length: 12.3 cm. Echogenicity within normal limits. No mass or
hydronephrosis visualized.

Bladder:

Decompressed by Foley catheter.  Distal end of double-J stent noted.
IMPRESSION: 1. Persistent 6.6   cm Right renal abscess.
2. Interval resolution of hydronephrosis.

## 2015-07-26 ENCOUNTER — Other Ambulatory Visit: Payer: Self-pay | Admitting: Oncology

## 2015-07-29 ENCOUNTER — Other Ambulatory Visit (HOSPITAL_BASED_OUTPATIENT_CLINIC_OR_DEPARTMENT_OTHER): Payer: Medicaid Other

## 2015-07-29 ENCOUNTER — Ambulatory Visit (HOSPITAL_BASED_OUTPATIENT_CLINIC_OR_DEPARTMENT_OTHER): Payer: Medicaid Other

## 2015-07-29 ENCOUNTER — Ambulatory Visit (HOSPITAL_BASED_OUTPATIENT_CLINIC_OR_DEPARTMENT_OTHER): Payer: Medicaid Other | Admitting: Nurse Practitioner

## 2015-07-29 ENCOUNTER — Telehealth: Payer: Self-pay | Admitting: Nurse Practitioner

## 2015-07-29 VITALS — BP 97/78 | HR 108 | Temp 98.4°F | Resp 18 | Wt 143.4 lb

## 2015-07-29 DIAGNOSIS — Z5111 Encounter for antineoplastic chemotherapy: Secondary | ICD-10-CM | POA: Diagnosis not present

## 2015-07-29 DIAGNOSIS — C7800 Secondary malignant neoplasm of unspecified lung: Secondary | ICD-10-CM

## 2015-07-29 DIAGNOSIS — Z5112 Encounter for antineoplastic immunotherapy: Secondary | ICD-10-CM

## 2015-07-29 DIAGNOSIS — C20 Malignant neoplasm of rectum: Secondary | ICD-10-CM

## 2015-07-29 LAB — COMPREHENSIVE METABOLIC PANEL
ALT: <9 U/L (ref 0–55)
AST: 14 U/L (ref 5–34)
Albumin: 3.2 g/dL — ABNORMAL LOW (ref 3.5–5.0)
Alkaline Phosphatase: 108 U/L (ref 40–150)
Anion Gap: 9 mEq/L (ref 3–11)
BUN: 11.3 mg/dL (ref 7.0–26.0)
CALCIUM: 9.4 mg/dL (ref 8.4–10.4)
CHLORIDE: 100 meq/L (ref 98–109)
CO2: 28 meq/L (ref 22–29)
Creatinine: 1.1 mg/dL (ref 0.7–1.3)
EGFR: 78 mL/min/{1.73_m2} — ABNORMAL LOW (ref >90)
Glucose: 109 mg/dl (ref 70–140)
POTASSIUM: 4.2 meq/L (ref 3.5–5.1)
Sodium: 137 mEq/L (ref 136–145)
Total Bilirubin: 0.77 mg/dL (ref 0.20–1.20)
Total Protein: 6.8 g/dL (ref 6.4–8.3)

## 2015-07-29 LAB — CBC WITH DIFFERENTIAL/PLATELET
BASO%: 0.2 % (ref 0.0–2.0)
BASOS ABS: 0 10*3/uL (ref 0.0–0.1)
EOS%: 0.5 % (ref 0.0–7.0)
Eosinophils Absolute: 0.1 10*3/uL (ref 0.0–0.5)
HCT: 35.5 % — ABNORMAL LOW (ref 38.4–49.9)
HGB: 10.9 g/dL — ABNORMAL LOW (ref 13.0–17.1)
LYMPH%: 6 % — AB (ref 14.0–49.0)
MCH: 26.5 pg — AB (ref 27.2–33.4)
MCHC: 30.7 g/dL — AB (ref 32.0–36.0)
MCV: 86.2 fL (ref 79.3–98.0)
MONO#: 0.2 10*3/uL (ref 0.1–0.9)
MONO%: 1.7 % (ref 0.0–14.0)
NEUT#: 9.7 10*3/uL — ABNORMAL HIGH (ref 1.5–6.5)
NEUT%: 91.6 % — AB (ref 39.0–75.0)
PLATELETS: 247 10*3/uL (ref 140–400)
RBC: 4.12 10*6/uL — AB (ref 4.20–5.82)
RDW: 19.8 % — ABNORMAL HIGH (ref 11.0–14.6)
WBC: 10.6 10*3/uL — AB (ref 4.0–10.3)
lymph#: 0.6 10*3/uL — ABNORMAL LOW (ref 0.9–3.3)

## 2015-07-29 MED ORDER — IRINOTECAN HCL CHEMO INJECTION 100 MG/5ML
178.0000 mg/m2 | Freq: Once | INTRAVENOUS | Status: AC
  Administered 2015-07-29: 340 mg via INTRAVENOUS
  Filled 2015-07-29: qty 5.67

## 2015-07-29 MED ORDER — ATROPINE SULFATE 1 MG/ML IJ SOLN: 0.5000 mg | Freq: Once | INTRAMUSCULAR | Status: DC | PRN

## 2015-07-29 MED ORDER — PALONOSETRON HCL INJECTION 0.25 MG/5ML
INTRAVENOUS | Status: AC
  Filled 2015-07-29: qty 5

## 2015-07-29 MED ORDER — LEUCOVORIN CALCIUM INJECTION 350 MG
398.0000 mg/m2 | Freq: Once | INTRAVENOUS | Status: AC
  Administered 2015-07-29: 760 mg via INTRAVENOUS
  Filled 2015-07-29: qty 38

## 2015-07-29 MED ORDER — SODIUM CHLORIDE 0.9 % IV SOLN
10.0000 mg | Freq: Once | INTRAVENOUS | Status: AC
  Administered 2015-07-29: 10 mg via INTRAVENOUS
  Filled 2015-07-29: qty 1

## 2015-07-29 MED ORDER — SODIUM CHLORIDE 0.9 % IV SOLN
Freq: Once | INTRAVENOUS | Status: AC
  Administered 2015-07-29: 13:00:00 via INTRAVENOUS

## 2015-07-29 MED ORDER — SODIUM CHLORIDE 0.9 % IJ SOLN
10.0000 mL | INTRAMUSCULAR | Status: DC | PRN
  Filled 2015-07-29: qty 10

## 2015-07-29 MED ORDER — PALONOSETRON HCL INJECTION 0.25 MG/5ML
0.2500 mg | Freq: Once | INTRAVENOUS | Status: AC
  Administered 2015-07-29: 0.25 mg via INTRAVENOUS

## 2015-07-29 MED ORDER — SODIUM CHLORIDE 0.9 % IV SOLN
6.0000 mg/kg | Freq: Once | INTRAVENOUS | Status: AC
  Administered 2015-07-29: 400 mg via INTRAVENOUS
  Filled 2015-07-29: qty 20

## 2015-07-29 MED ORDER — OXYCODONE HCL 5 MG PO TABS: ORAL_TABLET | ORAL | Status: DC

## 2015-07-29 MED ORDER — HEPARIN SOD (PORK) LOCK FLUSH 100 UNIT/ML IV SOLN
500.0000 [IU] | Freq: Once | INTRAVENOUS | Status: DC | PRN
  Filled 2015-07-29: qty 5

## 2015-07-29 MED ORDER — FLUOROURACIL CHEMO INJECTION 2.5 GM/50ML
400.0000 mg/m2 | Freq: Once | INTRAVENOUS | Status: AC
  Administered 2015-07-29: 750 mg via INTRAVENOUS
  Filled 2015-07-29: qty 15

## 2015-07-29 MED ORDER — SODIUM CHLORIDE 0.9 % IV SOLN
2400.0000 mg/m2 | INTRAVENOUS | Status: DC
  Administered 2015-07-29: 4600 mg via INTRAVENOUS
  Filled 2015-07-29: qty 92

## 2015-07-29 NOTE — Telephone Encounter (Signed)
appt made per 3/15 pof. avs to be printed in infusion

## 2015-07-29 NOTE — Patient Instructions (Signed)
[facility] Discharge Instructions for Patients Receiving Chemotherapy  Today you received the following chemotherapy agents Vectibix/Irinotecan/Leucovorin/5FU.  To help prevent nausea and vomiting after your treatment, we encourage you to take your nausea medication as prescribed.   If you develop nausea and vomiting that is not controlled by your nausea medication, call the clinic.   BELOW ARE SYMPTOMS THAT SHOULD BE REPORTED IMMEDIATELY:  *FEVER GREATER THAN 100.5 F  *CHILLS WITH OR WITHOUT FEVER  NAUSEA AND VOMITING THAT IS NOT CONTROLLED WITH YOUR NAUSEA MEDICATION  *UNUSUAL SHORTNESS OF BREATH  *UNUSUAL BRUISING OR BLEEDING  TENDERNESS IN MOUTH AND THROAT WITH OR WITHOUT PRESENCE OF ULCERS  *URINARY PROBLEMS  *BOWEL PROBLEMS  UNUSUAL RASH Items with * indicate a potential emergency and should be followed up as soon as possible.  Feel free to call the clinic you have any questions or concerns. The clinic phone number is [phone].  Please show the [place] at check-in to the Emergency Department and triage nurse.

## 2015-07-29 NOTE — Progress Notes (Signed)
[facility] OFFICE PROGRESS NOTE   Diagnosis:   Rectal cancer  INTERVAL HISTORY:   Grant [name] returns as scheduled. He completed cycle 3 FOLFIRI/PANITUMUMAB on 07/14/2015. He denies nausea/vomiting. No mouth sores. No diarrhea. He notes that his skin is very dry. Sacral pain is unchanged. He continues oxycodone as needed. He notes skin irritation surrounding the ostomy sites. His main complaint is being "tired".  Objective:  Vital signs in last 24 hours:  Blood pressure 97/78, pulse 108, temperature 98.4 F (36.9 C), temperature source Oral, resp. rate 18, weight 143 lb 6 oz (65.034 kg), SpO2 100 %.    HEENT: No thrush or ulcers. Resp: Lungs clear bilaterally. Cardio: Regular rate and rhythm. GI: Fullness right upper quadrant superior to the urostomy site. Left lower quadrant colostomy. Vascular: No leg edema. Skin: Acne-like rash over the face.    Lab Results:  Lab Results  Component Value Date   WBC 10.6* 07/29/2015   HGB 10.9* 07/29/2015   HCT 35.5* 07/29/2015   MCV 86.2 07/29/2015   PLT 247 07/29/2015   NEUTROABS 9.7* 07/29/2015    Imaging:  No results found.  Medications: I have reviewed the patient's current medications.  Assessment/Plan: 1. Rectal cancer-large pelvic mass with a colonoscopy 10/11/2013 confirming an obstructing tumor at 9 cm from the anal verge  CTs of the chest, abdomen, and revealed a large complex pelvic mass, gas in the bladder, loculated gas posterior to the bladder, right hydronephrosis, and small bilateral indeterminant lung nodules.   Initiation of radiation and Xeloda 12/18/2013; completed 01/29/2014.  Restaging CT evaluation at [facility] showed decrease in size of the rectosigmoid mass from 14.6 x 10.5 cm to 9.5 x 8.6 cm. Post radiation changes. Moderate right hydronephrosis with a double-J ureteral stent in place and mild left hydronephrosis, unchanged. Pulmonary nodules unchanged. Circumferential bladder wall  thickening likely secondary to radiation. Rectosigmoid mass abuts the posterior bladder and involvement cannot be excluded.  Cycle 1 Capox beginning 03/13/2014  Cycle 2 CAPOX beginning 04/03/2014.  Cycle 3 CAPOX beginning 04/24/2014.  CT 05/13/2014 confirming a decrease in the rectal mass  Pelvic exoneration at [facility] on 06/24/2014 confirming a ypT4,ypN0 tumor. The anterior and posterior cervical frontal margins were less than 1 mm, tumor involved the bladder and left ureter. Surgically included an APR, cystectomy, ileal conduit, intraoperative radiation, and sacrectomy, and vertical rectus myocutaneous flap  Cycle 1 adjuvant CAPOX 08/25/2014  Cycle 2 adjuvant CAPOX 09/15/2014  Cycle 3 adjuvant CAPOX 10/06/2014  Cycle 4 adjuvant CAPOX 10/27/2014  CT abdomen/pelvis at [facility] 11/16/2014 with chronic right hydronephrosis, nodular collection of soft tissue in the right pelvis consistent with post surgical change, though recurrent disease cannot be excluded  CT at [facility] 02/09/2015 with increased fullness of the right renal pelvis and enlargement of a lung nodule  Biopsy of the right renal pelvis 02/13/2015 confirmed metastatic colorectal cancer  Staging PET scan 03/05/2015 consistent with multiple lung metastases, a hepatic abscess, and increased FDG uptake at the tip of the sacrum  Restaging CT scans 06/02/2015 showed mild progression of pulmonary nodules; resolution of right hepatic abscess; significant enlargement of a right renal mass; extensive surgical changes; increasing soft tissue fullness about the coccyx suspicious for direct involvement and/or osseous metastasis; ill definition of the right sacrum suspicious for direct tumor involvement and/or osseous metastasis.  Cycle 1 FOLFIRI/panitumumab 06/09/2015  Cycle 2 FOLFIRI/PANITUMUMAB 06/30/2015, Neulasta added  Cycle 3 FOLFIRI/PANITUMUMAB 07/14/2015, with Neulasta  Cycle 4 FOLFIRI/PANITUMUMAB 07/29/2015 with Neulasta 2. Right  hydronephrosis  secondary to the obstructing pelvic mass, status post placement of a right percutaneous nephrostomy tube followed by a right ureter stent.  Persistent hydronephrosis with a new right kidney cystic lesion on a CT 05/13/2014  Right renal parenchymal abscess and bladder stone confirmed on a cystoscopy 05/13/2014, status post a cystolithalopaxy, right ureter stent exchange  Placement of a percutaneous right renal abscess drain on 05/15/2014  CT abdomen 05/20/2014 showed near-complete drainage of the right renal abscess. The drainage catheter has been removed.  He completed a 6 week course of Levaquin. 3. Microcytic anemia-likely iron deficiency anemia secondary to bleeding from the rectal and hematuria  4. Possible colovesical fistula-on the CT 10/09/2013 evaluated by Dr. Alinda Money, followup CT cystogram 10/18/2013 revealed no fistula.  5. Anorexia/weight loss secondary to #1. 6. Indeterminate lung nodules on a CT of the chest 10/09/2013. Stable on CT 03/03/2014. 7. Family history of pancreas and colon cancer. 8. 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 9. History of a Colovesical fistula. 10. Hospitalization 12/10/2013 through 12/12/2013 presenting with diarrhea. CT abdomen/pelvis showed an enlarging rectal mass filling almost the entire pelvis. Air noted in the bladder and renal collecting system. Linear collection of air noted along the anterior wall of the colorectal mass abutting the bladder felt to possibly represent a site of fistulization. Stool noted throughout the right colon. Transferred to [facility] Neoadjuvant chemotherapy/radiation recommended. 11. Skin toxicity at the perineum related to radiation and Xeloda. 12. Pneumonia on the CT 05/20/2014. He completed a course of Levaquin. 13. Right greater than left leg and foot numbness following the sacrectomy 14. Sacrum pain following the APR/ sacrectomy, persistent pain in the sacrum with increased FDG activity at the tip of the sacrum on a  PET scan 03/05/2015 15. Enterococcus faecalis urinary tract infection and C. difficile colitis 11/16/2014 16. Hepatic abscess on the PET scan 03/05/2015-status post drainage followed by a course of antibiotics 17. Neutropenia secondary to chemotherapy-chemotherapy held 06/23/2015, Neulasta added with cycle 2 FOLFIRI/panitumumab     Disposition: Grant [name] appears stable. He has completed 3 cycles of FOLFIRI/PANITUMUMAB. Plan to proceed with cycle 4 today as scheduled. He will return for a follow-up visit and cycle 5 in 2 weeks.   The plan is for a restaging CT evaluation after cycle 5.  He was given the phone number to [facility] to schedule an appointment with the ostomy nurse for evaluation of skin irritation around the ostomy site.  Plan reviewed with Dr. Benay Spice.  Ned Card ANP/GNP-BC   07/29/2015  12:51 PM

## 2015-07-30 LAB — CEA: CEA1: 7.8 ng/mL — AB (ref 0.0–4.7)

## 2015-07-31 ENCOUNTER — Ambulatory Visit: Payer: Medicaid Other

## 2015-07-31 ENCOUNTER — Ambulatory Visit (HOSPITAL_BASED_OUTPATIENT_CLINIC_OR_DEPARTMENT_OTHER): Payer: Medicaid Other

## 2015-07-31 VITALS — BP 94/75 | HR 99 | Temp 98.1°F | Resp 16

## 2015-07-31 DIAGNOSIS — C7800 Secondary malignant neoplasm of unspecified lung: Secondary | ICD-10-CM | POA: Diagnosis not present

## 2015-07-31 DIAGNOSIS — C20 Malignant neoplasm of rectum: Secondary | ICD-10-CM | POA: Diagnosis present

## 2015-07-31 MED ORDER — HEPARIN SOD (PORK) LOCK FLUSH 100 UNIT/ML IV SOLN
500.0000 [IU] | Freq: Once | INTRAVENOUS | Status: AC | PRN
  Administered 2015-07-31: 500 [IU]
  Filled 2015-07-31: qty 5

## 2015-07-31 MED ORDER — PEGFILGRASTIM INJECTION 6 MG/0.6ML ~~LOC~~
6.0000 mg | PREFILLED_SYRINGE | Freq: Once | SUBCUTANEOUS | Status: AC
  Administered 2015-07-31: 6 mg via SUBCUTANEOUS
  Filled 2015-07-31: qty 0.6

## 2015-07-31 MED ORDER — SODIUM CHLORIDE 0.9 % IJ SOLN
10.0000 mL | INTRAMUSCULAR | Status: DC | PRN
  Administered 2015-07-31: 10 mL
  Filled 2015-07-31: qty 10

## 2015-07-31 NOTE — Progress Notes (Signed)
Neulasta injection given by infusion nurse 

## 2015-07-31 NOTE — Patient Instructions (Signed)

## 2015-08-09 ENCOUNTER — Other Ambulatory Visit: Payer: Self-pay | Admitting: Oncology

## 2015-08-11 ENCOUNTER — Other Ambulatory Visit (HOSPITAL_BASED_OUTPATIENT_CLINIC_OR_DEPARTMENT_OTHER): Payer: Medicaid Other

## 2015-08-11 ENCOUNTER — Encounter: Payer: Self-pay | Admitting: *Deleted

## 2015-08-11 ENCOUNTER — Other Ambulatory Visit: Payer: Self-pay | Admitting: *Deleted

## 2015-08-11 ENCOUNTER — Ambulatory Visit (HOSPITAL_BASED_OUTPATIENT_CLINIC_OR_DEPARTMENT_OTHER): Payer: Medicaid Other | Admitting: Oncology

## 2015-08-11 ENCOUNTER — Telehealth: Payer: Self-pay | Admitting: Oncology

## 2015-08-11 ENCOUNTER — Ambulatory Visit: Payer: Medicaid Other | Admitting: Nutrition

## 2015-08-11 ENCOUNTER — Ambulatory Visit (HOSPITAL_BASED_OUTPATIENT_CLINIC_OR_DEPARTMENT_OTHER): Payer: Medicaid Other

## 2015-08-11 VITALS — BP 109/70 | HR 74 | Temp 98.2°F | Resp 18

## 2015-08-11 VITALS — BP 117/81 | HR 122 | Temp 98.3°F | Resp 20 | Ht 78.0 in | Wt 133.9 lb

## 2015-08-11 DIAGNOSIS — C20 Malignant neoplasm of rectum: Secondary | ICD-10-CM

## 2015-08-11 DIAGNOSIS — C7800 Secondary malignant neoplasm of unspecified lung: Secondary | ICD-10-CM

## 2015-08-11 DIAGNOSIS — F419 Anxiety disorder, unspecified: Secondary | ICD-10-CM

## 2015-08-11 DIAGNOSIS — Z5111 Encounter for antineoplastic chemotherapy: Secondary | ICD-10-CM

## 2015-08-11 DIAGNOSIS — R634 Abnormal weight loss: Secondary | ICD-10-CM | POA: Diagnosis not present

## 2015-08-11 DIAGNOSIS — Z5112 Encounter for antineoplastic immunotherapy: Secondary | ICD-10-CM | POA: Diagnosis not present

## 2015-08-11 DIAGNOSIS — D509 Iron deficiency anemia, unspecified: Secondary | ICD-10-CM | POA: Diagnosis not present

## 2015-08-11 LAB — COMPREHENSIVE METABOLIC PANEL
ALBUMIN: 3.2 g/dL — AB (ref 3.5–5.0)
ALT: <9 U/L (ref 0–55)
AST: 9 U/L (ref 5–34)
Alkaline Phosphatase: 99 U/L (ref 40–150)
Anion Gap: 10 mEq/L (ref 3–11)
BUN: 7.2 mg/dL (ref 7.0–26.0)
CALCIUM: 10 mg/dL (ref 8.4–10.4)
CHLORIDE: 99 meq/L (ref 98–109)
CO2: 28 mEq/L (ref 22–29)
CREATININE: 1.1 mg/dL (ref 0.7–1.3)
EGFR: 74 mL/min/{1.73_m2} — ABNORMAL LOW (ref >90)
GLUCOSE: 167 mg/dL — AB (ref 70–140)
Potassium: 4.3 mEq/L (ref 3.5–5.1)
Sodium: 136 mEq/L (ref 136–145)
TOTAL PROTEIN: 6.7 g/dL (ref 6.4–8.3)
Total Bilirubin: 0.57 mg/dL (ref 0.20–1.20)

## 2015-08-11 LAB — CBC WITH DIFFERENTIAL/PLATELET
BASO%: 0.7 % (ref 0.0–2.0)
BASOS ABS: 0 10*3/uL (ref 0.0–0.1)
EOS%: 1.8 % (ref 0.0–7.0)
Eosinophils Absolute: 0 10*3/uL (ref 0.0–0.5)
HEMATOCRIT: 32.9 % — AB (ref 38.4–49.9)
HGB: 10.5 g/dL — ABNORMAL LOW (ref 13.0–17.1)
LYMPH#: 0.2 10*3/uL — AB (ref 0.9–3.3)
LYMPH%: 9.8 % — AB (ref 14.0–49.0)
MCH: 26 pg — AB (ref 27.2–33.4)
MCHC: 31.9 g/dL — AB (ref 32.0–36.0)
MCV: 81.6 fL (ref 79.3–98.0)
MONO#: 0.3 10*3/uL (ref 0.1–0.9)
MONO%: 13.9 % (ref 0.0–14.0)
NEUT#: 1.7 10*3/uL (ref 1.5–6.5)
NEUT%: 73.8 % (ref 39.0–75.0)
Platelets: 285 10*3/uL (ref 140–400)
RBC: 4.03 10*6/uL — AB (ref 4.20–5.82)
RDW: 20.1 % — ABNORMAL HIGH (ref 11.0–14.6)
WBC: 2.3 10*3/uL — ABNORMAL LOW (ref 4.0–10.3)

## 2015-08-11 LAB — MAGNESIUM: MAGNESIUM: 1.6 mg/dL (ref 1.5–2.5)

## 2015-08-11 MED ORDER — SODIUM CHLORIDE 0.9 % IV SOLN
Freq: Once | INTRAVENOUS | Status: AC
  Administered 2015-08-11: 11:00:00 via INTRAVENOUS

## 2015-08-11 MED ORDER — PALONOSETRON HCL INJECTION 0.25 MG/5ML
INTRAVENOUS | Status: AC
  Filled 2015-08-11: qty 5

## 2015-08-11 MED ORDER — SODIUM CHLORIDE 0.9 % IV SOLN
6.0000 mg/kg | Freq: Once | INTRAVENOUS | Status: AC
  Administered 2015-08-11: 400 mg via INTRAVENOUS
  Filled 2015-08-11: qty 20

## 2015-08-11 MED ORDER — ATROPINE SULFATE 1 MG/ML IJ SOLN
INTRAMUSCULAR | Status: AC
  Filled 2015-08-11: qty 1

## 2015-08-11 MED ORDER — HEPARIN SOD (PORK) LOCK FLUSH 100 UNIT/ML IV SOLN
500.0000 [IU] | Freq: Once | INTRAVENOUS | Status: DC | PRN
  Filled 2015-08-11: qty 5

## 2015-08-11 MED ORDER — FLUOROURACIL CHEMO INJECTION 2.5 GM/50ML
400.0000 mg/m2 | Freq: Once | INTRAVENOUS | Status: AC
  Administered 2015-08-11: 750 mg via INTRAVENOUS
  Filled 2015-08-11: qty 15

## 2015-08-11 MED ORDER — SODIUM CHLORIDE 0.9 % IV SOLN
2400.0000 mg/m2 | INTRAVENOUS | Status: DC
  Administered 2015-08-11: 4600 mg via INTRAVENOUS
  Filled 2015-08-11: qty 92

## 2015-08-11 MED ORDER — DEXTROSE 5 % IV SOLN
400.0000 mg/m2 | Freq: Once | INTRAVENOUS | Status: AC
  Administered 2015-08-11: 764 mg via INTRAVENOUS
  Filled 2015-08-11: qty 38.2

## 2015-08-11 MED ORDER — ATROPINE SULFATE 1 MG/ML IJ SOLN
0.5000 mg | Freq: Once | INTRAMUSCULAR | Status: AC | PRN
  Administered 2015-08-11: 0.5 mg via INTRAVENOUS

## 2015-08-11 MED ORDER — PALONOSETRON HCL INJECTION 0.25 MG/5ML
0.2500 mg | Freq: Once | INTRAVENOUS | Status: AC
  Administered 2015-08-11: 0.25 mg via INTRAVENOUS

## 2015-08-11 MED ORDER — SODIUM CHLORIDE 0.9 % IV SOLN: Freq: Once | INTRAVENOUS | Status: DC

## 2015-08-11 MED ORDER — IRINOTECAN HCL CHEMO INJECTION 100 MG/5ML
178.0000 mg/m2 | Freq: Once | INTRAVENOUS | Status: AC
  Administered 2015-08-11: 340 mg via INTRAVENOUS
  Filled 2015-08-11: qty 5.67

## 2015-08-11 MED ORDER — MORPHINE SULFATE ER 15 MG PO TBCR: 15.0000 mg | EXTENDED_RELEASE_TABLET | Freq: Two times a day (BID) | ORAL | Status: DC

## 2015-08-11 MED ORDER — SODIUM CHLORIDE 0.9 % IV SOLN
10.0000 mg | Freq: Once | INTRAVENOUS | Status: AC
  Administered 2015-08-11: 10 mg via INTRAVENOUS
  Filled 2015-08-11: qty 1

## 2015-08-11 MED ORDER — SODIUM CHLORIDE 0.9 % IJ SOLN
10.0000 mL | INTRAMUSCULAR | Status: DC | PRN
  Filled 2015-08-11: qty 10

## 2015-08-11 NOTE — Telephone Encounter (Signed)
per pof to sch pt appt-pt appts sch per pof to follow as sch

## 2015-08-11 NOTE — Progress Notes (Signed)
Nutrition follow-up completed with patient during infusion for rectal cancer. Patient reports increased pain Patient has had decreased oral intake secondary to nausea. Patient is underweight at 133.9 pounds and BMI of 15.48. Severe malnutrition continuous  Nutrition diagnosis: Unintended weight loss continues.  Intervention:  Educated patient to continue to take nausea medications as prescribed. Encouraged dry, salty, bland food in small amounts throughout the day to assist with nausea. If tolerated, encouraged patient to consume oral nutrition supplements and provided samples. Provided fact sheet on nausea and vomiting. Questions were answered.  Teach back method used.  Monitoring, evaluation, goals: Patient will tolerate increased oral intake to minimize further weight loss.  Next visit: Tuesday, April 11, during infusion.  **Disclaimer: This note was dictated with voice recognition software. Similar sounding words can inadvertently be transcribed and this note may contain transcription errors which may not have been corrected upon publication of note.**

## 2015-08-11 NOTE — Progress Notes (Signed)
Per Dr Benay Spice OK to proceed with Vectibix infusion today with Mg level 1.6

## 2015-08-11 NOTE — Patient Instructions (Signed)
[facility] Discharge Instructions for Patients Receiving Chemotherapy  Today you received the following chemotherapy agents:  Vectibix, Fluorouracil, Irinotecan, Leucovorin  To help prevent nausea and vomiting after your treatment, we encourage you to take your nausea medication as prescribed.   If you develop nausea and vomiting that is not controlled by your nausea medication, call the clinic.   BELOW ARE SYMPTOMS THAT SHOULD BE REPORTED IMMEDIATELY:  *FEVER GREATER THAN 100.5 F  *CHILLS WITH OR WITHOUT FEVER  NAUSEA AND VOMITING THAT IS NOT CONTROLLED WITH YOUR NAUSEA MEDICATION  *UNUSUAL SHORTNESS OF BREATH  *UNUSUAL BRUISING OR BLEEDING  TENDERNESS IN MOUTH AND THROAT WITH OR WITHOUT PRESENCE OF ULCERS  *URINARY PROBLEMS  *BOWEL PROBLEMS  UNUSUAL RASH Items with * indicate a potential emergency and should be followed up as soon as possible.  Feel free to call the clinic you have any questions or concerns. The clinic phone number is [phone].  Please show the [facility] at check-in to the Emergency Department and triage nurse.

## 2015-08-11 NOTE — Progress Notes (Signed)
Oncology Nurse Navigator Documentation  Oncology Nurse Navigator Flowsheets 08/11/2015  Navigator Location [facility] On[facility]  Navigator Encounter Type Treatment  Patient Visit Type MedOnc  Treatment Phase Active Tx--FOLFIRI/Panitumumab  Barriers/Navigation Needs -Reviewed action of MS Contin and increased potential for constipation and lethargy and fall risk   Interventions Education Method;Coordination of Care  Coordination of Care Chemo--arranged with charge nurse for him to have a bed today due to pain  Education Method Verbal  Acuity -  Time Spent with Patient 15  Time Spent with Patient (Retired) -

## 2015-08-11 NOTE — Progress Notes (Signed)
[facility] OFFICE PROGRESS NOTE   Diagnosis:  Rectal cancer  INTERVAL HISTORY:    Grant [name] completed another cycle of FOLFIRI/panitumumab 07/29/2015. He tolerated chemotherapy well. No nausea/vomiting or diarrhea. He complains of malaise. His appetite has been poor for the past week after the hospitalization of his father. He is not taking Megace. He has stable pain at the sacrum. He has noted a mild skin rash over the face and head. He is taking 12 oxycodone tablets per day for relief of pain.  Objective:  Vital signs in last 24 hours:  Blood pressure 117/81, pulse 122, temperature 98.3 F (36.8 C), temperature source Oral, resp. rate 20, height 6\' 6"  (1.981 m), weight 133 lb 14.4 oz (60.737 kg), SpO2 100 %.    HEENT:  No thrush or ulcers Resp:  Lungs clear bilaterally Cardio:  Regular rate and rhythm GI:  No hepatomegaly, tender at the right abdomen superior to the ostomy bag Vascular:  No leg edema  Skin: mild acne type rash at the face and forehead   Portacath/PICC-without erythema  Lab Results:  Lab Results  Component Value Date   WBC 2.3* 08/11/2015   HGB 10.5* 08/11/2015   HCT 32.9* 08/11/2015   MCV 81.6 08/11/2015   PLT 285 08/11/2015   NEUTROABS 1.7 08/11/2015     Lab Results  Component Value Date   CEA1 7.8* 07/29/2015     Medications: I have reviewed the patient's current medications.  Assessment/Plan: 1. Rectal cancer-large pelvic mass with a colonoscopy 10/11/2013 confirming an obstructing tumor at 9 cm from the anal verge  CTs of the chest, abdomen, and revealed a large complex pelvic mass, gas in the bladder, loculated gas posterior to the bladder, right hydronephrosis, and small bilateral indeterminant lung nodules.   Initiation of radiation and Xeloda 12/18/2013; completed 01/29/2014.  Restaging CT evaluation at [facility] showed decrease in size of the rectosigmoid mass from 14.6 x 10.5 cm to 9.5 x 8.6 cm. Post radiation  changes. Moderate right hydronephrosis with a double-J ureteral stent in place and mild left hydronephrosis, unchanged. Pulmonary nodules unchanged. Circumferential bladder wall thickening likely secondary to radiation. Rectosigmoid mass abuts the posterior bladder and involvement cannot be excluded.  Cycle 1 Capox beginning 03/13/2014  Cycle 2 CAPOX beginning 04/03/2014.  Cycle 3 CAPOX beginning 04/24/2014.  CT 05/13/2014 confirming a decrease in the rectal mass  Pelvic exoneration at [facility] on 06/24/2014 confirming a ypT4,ypN0 tumor. The anterior and posterior cervical frontal margins were less than 1 mm, tumor involved the bladder and left ureter. Surgically included an APR, cystectomy, ileal conduit, intraoperative radiation, and sacrectomy, and vertical rectus myocutaneous flap  Cycle 1 adjuvant CAPOX 08/25/2014  Cycle 2 adjuvant CAPOX 09/15/2014  Cycle 3 adjuvant CAPOX 10/06/2014  Cycle 4 adjuvant CAPOX 10/27/2014  CT abdomen/pelvis at [facility] 11/16/2014 with chronic right hydronephrosis, nodular collection of soft tissue in the right pelvis consistent with post surgical change, though recurrent disease cannot be excluded  CT at [facility] 02/09/2015 with increased fullness of the right renal pelvis and enlargement of a lung nodule  Biopsy of the right renal pelvis 02/13/2015 confirmed metastatic colorectal cancer  Staging PET scan 03/05/2015 consistent with multiple lung metastases, a hepatic abscess, and increased FDG uptake at the tip of the sacrum  Restaging CT scans 06/02/2015 showed mild progression of pulmonary nodules; resolution of right hepatic abscess; significant enlargement of a right renal mass; extensive surgical changes; increasing soft tissue fullness about the coccyx suspicious for direct involvement and/or  osseous metastasis; ill definition of the right sacrum suspicious for direct tumor involvement and/or osseous metastasis.  Cycle 1 FOLFIRI/panitumumab 06/09/2015  Cycle 2  FOLFIRI/PANITUMUMAB 06/30/2015, Neulasta added  Cycle 3 FOLFIRI/PANITUMUMAB 07/14/2015, with Neulasta  Cycle 4 FOLFIRI/PANITUMUMAB 07/29/2015 with Neulasta   cycle 5 FOLFIRI/panitumumab 08/11/2015 with Neulasta  2. Right hydronephrosis secondary to the obstructing pelvic mass, status post placement of a right percutaneous nephrostomy tube followed by a right ureter stent.  Persistent hydronephrosis with a new right kidney cystic lesion on a CT 05/13/2014  Right renal parenchymal abscess and bladder stone confirmed on a cystoscopy 05/13/2014, status post a cystolithalopaxy, right ureter stent exchange  Placement of a percutaneous right renal abscess drain on 05/15/2014  CT abdomen 05/20/2014 showed near-complete drainage of the right renal abscess. The drainage catheter has been removed.  He completed a 6 week course of Levaquin. 3. Microcytic anemia-likely iron deficiency anemia secondary to bleeding from the rectal and hematuria  4. Possible colovesical fistula-on the CT 10/09/2013 evaluated by Dr. Alinda Money, followup CT cystogram 10/18/2013 revealed no fistula.  5. Anorexia/weight loss secondary to #1. 6. Indeterminate lung nodules on a CT of the chest 10/09/2013. Stable on CT 03/03/2014. 7. Family history of pancreas and colon cancer. 8. 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 9. History of a Colovesical fistula. 10. Hospitalization 12/10/2013 through 12/12/2013 presenting with diarrhea. CT abdomen/pelvis showed an enlarging rectal mass filling almost the entire pelvis. Air noted in the bladder and renal collecting system. Linear collection of air noted along the anterior wall of the colorectal mass abutting the bladder felt to possibly represent a site of fistulization. Stool noted throughout the right colon. Transferred to [facility]. Neoadjuvant chemotherapy/radiation recommended. 11. Skin toxicity at the perineum related to radiation and Xeloda. 12. Pneumonia on the CT 05/20/2014. He completed a  course of Levaquin. 13. Right greater than left leg and foot numbness following the sacrectomy 14. Sacrum pain following the APR/ sacrectomy, persistent pain in the sacrum with increased FDG activity at the tip of the sacrum on a PET scan 03/05/2015 15. Enterococcus faecalis urinary tract infection and C. difficile colitis 11/16/2014 16. Hepatic abscess on the PET scan 03/05/2015-status post drainage followed by a course of antibiotics 17. Neutropenia secondary to chemotherapy-chemotherapy held 06/23/2015, Neulasta added with cycle 2 FOLFIRI/panitumumab    Disposition:   Grant [name] will complete cycle 5 FOLFIRI/panitumumab today. He will undergo a restaging CT evaluation prior to an office visit in 2 weeks.   Grant [name] will begin a trial of MS Contin for pain control. He will start at a dose of 15 mg twice daily. His wife will contact us for somnolence with the MS Contin.   he will resume Megace.  Betsy Coder, MD  08/11/2015  10:32 AM

## 2015-08-13 ENCOUNTER — Ambulatory Visit (HOSPITAL_BASED_OUTPATIENT_CLINIC_OR_DEPARTMENT_OTHER): Payer: Medicaid Other

## 2015-08-13 ENCOUNTER — Ambulatory Visit: Payer: Medicaid Other

## 2015-08-13 DIAGNOSIS — C7800 Secondary malignant neoplasm of unspecified lung: Secondary | ICD-10-CM | POA: Diagnosis not present

## 2015-08-13 DIAGNOSIS — C20 Malignant neoplasm of rectum: Secondary | ICD-10-CM

## 2015-08-13 DIAGNOSIS — Z5189 Encounter for other specified aftercare: Secondary | ICD-10-CM | POA: Diagnosis not present

## 2015-08-13 MED ORDER — PEGFILGRASTIM INJECTION 6 MG/0.6ML ~~LOC~~
6.0000 mg | PREFILLED_SYRINGE | Freq: Once | SUBCUTANEOUS | Status: AC
  Administered 2015-08-13: 6 mg via SUBCUTANEOUS
  Filled 2015-08-13: qty 0.6

## 2015-08-13 MED ORDER — HEPARIN SOD (PORK) LOCK FLUSH 100 UNIT/ML IV SOLN
500.0000 [IU] | Freq: Once | INTRAVENOUS | Status: AC | PRN
  Administered 2015-08-13: 500 [IU]
  Filled 2015-08-13: qty 5

## 2015-08-13 MED ORDER — SODIUM CHLORIDE 0.9 % IJ SOLN
10.0000 mL | INTRAMUSCULAR | Status: DC | PRN
  Administered 2015-08-13: 10 mL
  Filled 2015-08-13: qty 10

## 2015-08-19 ENCOUNTER — Telehealth: Payer: Self-pay | Admitting: *Deleted

## 2015-08-19 DIAGNOSIS — R112 Nausea with vomiting, unspecified: Secondary | ICD-10-CM

## 2015-08-19 DIAGNOSIS — C20 Malignant neoplasm of rectum: Secondary | ICD-10-CM

## 2015-08-19 DIAGNOSIS — R197 Diarrhea, unspecified: Secondary | ICD-10-CM

## 2015-08-19 MED ORDER — DIPHENOXYLATE-ATROPINE 2.5-0.025 MG PO TABS: 1.0000 | ORAL_TABLET | Freq: Four times a day (QID) | ORAL | Status: DC | PRN

## 2015-08-19 MED ORDER — ALPRAZOLAM 1 MG PO TABS: ORAL_TABLET | ORAL | Status: DC

## 2015-08-19 NOTE — Telephone Encounter (Signed)
TC from pt's significant other, [name]. She states that pt is having ongoing nausea, some vomiting and large amount of watery stools via his ostomy.  He is unable to eat solid food but has been able to drink 2-3 16oz bottles of water plus some gingerale/day.  He states he is very weak but able to get out of bed to use bathroom/empty ostomy bag. Denies fever, SOB. [name] states he is scheduled for a CT scan on Friday and does not think he will be able to tolerate the contrast.  He has tried the compazine for nausea as well as  Imodium for the diarrhea without good relief.  Pt started on Zofran 4 mg last night and that has helped somewhat with the nausea.  Offered to have pt seen in [facility] but pt does not not want to come in as he is so worried about his ostomy bag 'exploding off' on the 1/2 drive here.  Discussed  with Dr. Benay Spice. His recommendation is to try Lomotil 1-2 tabs after each episode of watery stool, up to 8 /day, push fluids and then call with update tomorrow.  TC back to [place] and instructed on use of lomotil and pushing fluids.  Ulla Potash to call us back tomorrow with an update on his condition and we will decide on CT Scan at that time. Also advised her that if he gets worse to have him go to ED as she does not think she would be able to take him in the car.  [name] voiced understanding. [name] also requesting refill on Xanax. Last filled on 06/23/15.  Lomotil and Alprazolam called to [facility]

## 2015-08-20 ENCOUNTER — Other Ambulatory Visit (HOSPITAL_BASED_OUTPATIENT_CLINIC_OR_DEPARTMENT_OTHER): Payer: Medicaid Other

## 2015-08-20 ENCOUNTER — Encounter (HOSPITAL_COMMUNITY): Payer: Self-pay | Admitting: Internal Medicine

## 2015-08-20 ENCOUNTER — Telehealth: Payer: Self-pay

## 2015-08-20 ENCOUNTER — Inpatient Hospital Stay (HOSPITAL_COMMUNITY)
Admit: 2015-08-20 | Discharge: 2015-09-14 | DRG: 393 | Disposition: E | Payer: Medicaid Other | Source: Ambulatory Visit | Attending: Internal Medicine | Admitting: Internal Medicine

## 2015-08-20 ENCOUNTER — Emergency Department (HOSPITAL_COMMUNITY)
Admission: EM | Admit: 2015-08-20 | Discharge: 2015-08-20 | Disposition: A | Discharge status: Other Acute Inpatient Hospital | Payer: Medicaid Other | Source: Home / Self Care

## 2015-08-20 ENCOUNTER — Other Ambulatory Visit: Payer: Self-pay

## 2015-08-20 ENCOUNTER — Inpatient Hospital Stay (HOSPITAL_COMMUNITY): Payer: Medicaid Other

## 2015-08-20 ENCOUNTER — Ambulatory Visit (HOSPITAL_BASED_OUTPATIENT_CLINIC_OR_DEPARTMENT_OTHER): Payer: Medicaid Other | Admitting: Oncology

## 2015-08-20 ENCOUNTER — Telehealth: Payer: Self-pay | Admitting: *Deleted

## 2015-08-20 ENCOUNTER — Ambulatory Visit: Payer: Medicaid Other

## 2015-08-20 VITALS — BP 107/68 | HR 111 | Temp 98.5°F | Resp 16 | Ht 78.0 in

## 2015-08-20 DIAGNOSIS — J9601 Acute respiratory failure with hypoxia: Secondary | ICD-10-CM | POA: Diagnosis not present

## 2015-08-20 DIAGNOSIS — Z800 Family history of malignant neoplasm of digestive organs: Secondary | ICD-10-CM

## 2015-08-20 DIAGNOSIS — Z79899 Other long term (current) drug therapy: Secondary | ICD-10-CM

## 2015-08-20 DIAGNOSIS — L89150 Pressure ulcer of sacral region, unstageable: Secondary | ICD-10-CM | POA: Diagnosis present

## 2015-08-20 DIAGNOSIS — D638 Anemia in other chronic diseases classified elsewhere: Secondary | ICD-10-CM | POA: Diagnosis present

## 2015-08-20 DIAGNOSIS — E860 Dehydration: Secondary | ICD-10-CM | POA: Diagnosis present

## 2015-08-20 DIAGNOSIS — Z515 Encounter for palliative care: Secondary | ICD-10-CM | POA: Insufficient documentation

## 2015-08-20 DIAGNOSIS — J690 Pneumonitis due to inhalation of food and vomit: Secondary | ICD-10-CM | POA: Clinically undetermined

## 2015-08-20 DIAGNOSIS — J189 Pneumonia, unspecified organism: Secondary | ICD-10-CM | POA: Diagnosis not present

## 2015-08-20 DIAGNOSIS — C20 Malignant neoplasm of rectum: Secondary | ICD-10-CM | POA: Diagnosis present

## 2015-08-20 DIAGNOSIS — Z9079 Acquired absence of other genital organ(s): Secondary | ICD-10-CM | POA: Diagnosis not present

## 2015-08-20 DIAGNOSIS — D509 Iron deficiency anemia, unspecified: Secondary | ICD-10-CM | POA: Diagnosis present

## 2015-08-20 DIAGNOSIS — Z85048 Personal history of other malignant neoplasm of rectum, rectosigmoid junction, and anus: Secondary | ICD-10-CM

## 2015-08-20 DIAGNOSIS — R627 Adult failure to thrive: Secondary | ICD-10-CM | POA: Diagnosis present

## 2015-08-20 DIAGNOSIS — Z8249 Family history of ischemic heart disease and other diseases of the circulatory system: Secondary | ICD-10-CM | POA: Diagnosis not present

## 2015-08-20 DIAGNOSIS — R197 Diarrhea, unspecified: Secondary | ICD-10-CM

## 2015-08-20 DIAGNOSIS — N390 Urinary tract infection, site not specified: Secondary | ICD-10-CM | POA: Diagnosis present

## 2015-08-20 DIAGNOSIS — F1290 Cannabis use, unspecified, uncomplicated: Secondary | ICD-10-CM | POA: Diagnosis present

## 2015-08-20 DIAGNOSIS — E876 Hypokalemia: Secondary | ICD-10-CM | POA: Diagnosis present

## 2015-08-20 DIAGNOSIS — N179 Acute kidney failure, unspecified: Secondary | ICD-10-CM | POA: Diagnosis present

## 2015-08-20 DIAGNOSIS — F419 Anxiety disorder, unspecified: Secondary | ICD-10-CM | POA: Diagnosis not present

## 2015-08-20 DIAGNOSIS — J9600 Acute respiratory failure, unspecified whether with hypoxia or hypercapnia: Secondary | ICD-10-CM | POA: Insufficient documentation

## 2015-08-20 DIAGNOSIS — J9621 Acute and chronic respiratory failure with hypoxia: Secondary | ICD-10-CM | POA: Diagnosis not present

## 2015-08-20 DIAGNOSIS — Z66 Do not resuscitate: Secondary | ICD-10-CM | POA: Diagnosis not present

## 2015-08-20 DIAGNOSIS — Z923 Personal history of irradiation: Secondary | ICD-10-CM

## 2015-08-20 DIAGNOSIS — D709 Neutropenia, unspecified: Secondary | ICD-10-CM | POA: Diagnosis present

## 2015-08-20 DIAGNOSIS — Z906 Acquired absence of other parts of urinary tract: Secondary | ICD-10-CM

## 2015-08-20 DIAGNOSIS — Z888 Allergy status to other drugs, medicaments and biological substances status: Secondary | ICD-10-CM | POA: Diagnosis not present

## 2015-08-20 DIAGNOSIS — G893 Neoplasm related pain (acute) (chronic): Secondary | ICD-10-CM | POA: Diagnosis not present

## 2015-08-20 DIAGNOSIS — J9620 Acute and chronic respiratory failure, unspecified whether with hypoxia or hypercapnia: Secondary | ICD-10-CM | POA: Diagnosis not present

## 2015-08-20 DIAGNOSIS — N135 Crossing vessel and stricture of ureter without hydronephrosis: Secondary | ICD-10-CM | POA: Diagnosis present

## 2015-08-20 DIAGNOSIS — C649 Malignant neoplasm of unspecified kidney, except renal pelvis: Secondary | ICD-10-CM | POA: Diagnosis present

## 2015-08-20 DIAGNOSIS — D701 Agranulocytosis secondary to cancer chemotherapy: Secondary | ICD-10-CM | POA: Diagnosis present

## 2015-08-20 DIAGNOSIS — L89159 Pressure ulcer of sacral region, unspecified stage: Secondary | ICD-10-CM

## 2015-08-20 DIAGNOSIS — B3749 Other urogenital candidiasis: Secondary | ICD-10-CM | POA: Diagnosis present

## 2015-08-20 DIAGNOSIS — N189 Chronic kidney disease, unspecified: Secondary | ICD-10-CM | POA: Diagnosis present

## 2015-08-20 DIAGNOSIS — K521 Toxic gastroenteritis and colitis: Secondary | ICD-10-CM | POA: Diagnosis present

## 2015-08-20 DIAGNOSIS — R0602 Shortness of breath: Secondary | ICD-10-CM

## 2015-08-20 DIAGNOSIS — Z681 Body mass index (BMI) 19 or less, adult: Secondary | ICD-10-CM | POA: Diagnosis not present

## 2015-08-20 DIAGNOSIS — C7800 Secondary malignant neoplasm of unspecified lung: Secondary | ICD-10-CM

## 2015-08-20 DIAGNOSIS — Z933 Colostomy status: Secondary | ICD-10-CM | POA: Diagnosis not present

## 2015-08-20 DIAGNOSIS — T451X5A Adverse effect of antineoplastic and immunosuppressive drugs, initial encounter: Secondary | ICD-10-CM | POA: Diagnosis present

## 2015-08-20 DIAGNOSIS — Z01818 Encounter for other preprocedural examination: Secondary | ICD-10-CM | POA: Insufficient documentation

## 2015-08-20 DIAGNOSIS — Z9289 Personal history of other medical treatment: Secondary | ICD-10-CM

## 2015-08-20 DIAGNOSIS — Z720 Tobacco use: Secondary | ICD-10-CM | POA: Diagnosis not present

## 2015-08-20 DIAGNOSIS — Z7189 Other specified counseling: Secondary | ICD-10-CM | POA: Diagnosis not present

## 2015-08-20 DIAGNOSIS — R7989 Other specified abnormal findings of blood chemistry: Secondary | ICD-10-CM | POA: Diagnosis present

## 2015-08-20 DIAGNOSIS — E872 Acidosis: Secondary | ICD-10-CM | POA: Diagnosis not present

## 2015-08-20 DIAGNOSIS — E43 Unspecified severe protein-calorie malnutrition: Secondary | ICD-10-CM | POA: Diagnosis present

## 2015-08-20 DIAGNOSIS — Z91018 Allergy to other foods: Secondary | ICD-10-CM | POA: Diagnosis not present

## 2015-08-20 DIAGNOSIS — R64 Cachexia: Secondary | ICD-10-CM | POA: Diagnosis present

## 2015-08-20 DIAGNOSIS — Z9109 Other allergy status, other than to drugs and biological substances: Secondary | ICD-10-CM

## 2015-08-20 DIAGNOSIS — R4182 Altered mental status, unspecified: Secondary | ICD-10-CM | POA: Diagnosis not present

## 2015-08-20 DIAGNOSIS — J9690 Respiratory failure, unspecified, unspecified whether with hypoxia or hypercapnia: Secondary | ICD-10-CM | POA: Diagnosis not present

## 2015-08-20 DIAGNOSIS — R112 Nausea with vomiting, unspecified: Secondary | ICD-10-CM

## 2015-08-20 DIAGNOSIS — G9341 Metabolic encephalopathy: Secondary | ICD-10-CM | POA: Diagnosis not present

## 2015-08-20 DIAGNOSIS — L98429 Non-pressure chronic ulcer of back with unspecified severity: Secondary | ICD-10-CM | POA: Diagnosis present

## 2015-08-20 LAB — COMPREHENSIVE METABOLIC PANEL
ALBUMIN: 2.9 g/dL — AB (ref 3.5–5.0)
ALK PHOS: 171 U/L — AB (ref 40–150)
ALT: <9 U/L (ref 0–55)
ANION GAP: 12 meq/L — AB (ref 3–11)
AST: 9 U/L (ref 5–34)
BILIRUBIN TOTAL: 0.88 mg/dL (ref 0.20–1.20)
BUN: 17.8 mg/dL (ref 7.0–26.0)
CALCIUM: 9.5 mg/dL (ref 8.4–10.4)
CO2: 25 mEq/L (ref 22–29)
Chloride: 97 mEq/L — ABNORMAL LOW (ref 98–109)
Creatinine: 1.3 mg/dL (ref 0.7–1.3)
EGFR: 59 mL/min/{1.73_m2} — AB (ref >90)
Glucose: 127 mg/dl (ref 70–140)
Potassium: 4.1 mEq/L (ref 3.5–5.1)
Sodium: 134 mEq/L — ABNORMAL LOW (ref 136–145)
TOTAL PROTEIN: 6.4 g/dL (ref 6.4–8.3)

## 2015-08-20 LAB — URINE MICROSCOPIC-ADD ON: Squamous Epithelial / HPF: NONE SEEN

## 2015-08-20 LAB — CBC WITH DIFFERENTIAL/PLATELET
BASO%: 4.3 % — ABNORMAL HIGH (ref 0.0–2.0)
BASOS ABS: 0 10*3/uL (ref 0.0–0.1)
EOS ABS: 0 10*3/uL (ref 0.0–0.5)
EOS%: 2.1 % (ref 0.0–7.0)
HEMATOCRIT: 29.1 % — AB (ref 38.4–49.9)
HGB: 9.6 g/dL — ABNORMAL LOW (ref 13.0–17.1)
LYMPH%: 23.4 % (ref 14.0–49.0)
MCH: 26.4 pg — ABNORMAL LOW (ref 27.2–33.4)
MCHC: 33 g/dL (ref 32.0–36.0)
MCV: 79.9 fL (ref 79.3–98.0)
MONO#: 0.1 10*3/uL (ref 0.1–0.9)
MONO%: 21.3 % — AB (ref 0.0–14.0)
NEUT#: 0.2 10*3/uL — CL (ref 1.5–6.5)
NEUT%: 48.9 % (ref 39.0–75.0)
NRBC: 0 % (ref 0–0)
PLATELETS: 277 10*3/uL (ref 140–400)
RBC: 3.64 10*6/uL — AB (ref 4.20–5.82)
RDW: 17.1 % — AB (ref 11.0–14.6)
WBC: 0.5 10*3/uL — CL (ref 4.0–10.3)
lymph#: 0.1 10*3/uL — ABNORMAL LOW (ref 0.9–3.3)

## 2015-08-20 LAB — C DIFFICILE QUICK SCREEN W PCR REFLEX
C DIFFICLE (CDIFF) ANTIGEN: NEGATIVE
C Diff interpretation: NEGATIVE
C Diff toxin: NEGATIVE

## 2015-08-20 LAB — URINALYSIS, ROUTINE W REFLEX MICROSCOPIC
Glucose, UA: NEGATIVE mg/dL
KETONES UR: 15 mg/dL — AB
NITRITE: NEGATIVE
PH: 6 (ref 5.0–8.0)
Protein, ur: 100 mg/dL — AB
SPECIFIC GRAVITY, URINE: 1.021 (ref 1.005–1.030)

## 2015-08-20 LAB — PHOSPHORUS: Phosphorus: 2.6 mg/dL (ref 2.5–4.6)

## 2015-08-20 LAB — MAGNESIUM: MAGNESIUM: 1.5 mg/dL — AB (ref 1.7–2.4)

## 2015-08-20 MED ORDER — SODIUM CHLORIDE 0.9 % IV BOLUS (SEPSIS)
1000.0000 mL | Freq: Once | INTRAVENOUS | Status: AC
  Administered 2015-08-20: 1000 mL via INTRAVENOUS

## 2015-08-20 MED ORDER — ACETAMINOPHEN 325 MG PO TABS: 650.0000 mg | ORAL_TABLET | Freq: Four times a day (QID) | ORAL | Status: DC | PRN

## 2015-08-20 MED ORDER — ALPRAZOLAM 1 MG PO TABS
1.0000 mg | ORAL_TABLET | Freq: Two times a day (BID) | ORAL | Status: DC
  Administered 2015-08-20 – 2015-08-25 (×11): 1 mg via ORAL
  Filled 2015-08-20 (×11): qty 1

## 2015-08-20 MED ORDER — OXYCODONE-ACETAMINOPHEN 5-325 MG PO TABS
ORAL_TABLET | ORAL | Status: AC
  Filled 2015-08-20: qty 2

## 2015-08-20 MED ORDER — ONDANSETRON HCL 4 MG/2ML IJ SOLN
4.0000 mg | Freq: Four times a day (QID) | INTRAMUSCULAR | Status: DC | PRN
  Administered 2015-08-22 – 2015-08-24 (×4): 4 mg via INTRAVENOUS
  Filled 2015-08-20 (×5): qty 2

## 2015-08-20 MED ORDER — LEVALBUTEROL HCL 0.63 MG/3ML IN NEBU: 0.6300 mg | INHALATION_SOLUTION | RESPIRATORY_TRACT | Status: DC | PRN

## 2015-08-20 MED ORDER — LOPERAMIDE HCL 2 MG PO CAPS: 2.0000 mg | ORAL_CAPSULE | ORAL | Status: DC | PRN

## 2015-08-20 MED ORDER — ONDANSETRON HCL 4 MG PO TABS: 4.0000 mg | ORAL_TABLET | Freq: Three times a day (TID) | ORAL | Status: DC | PRN

## 2015-08-20 MED ORDER — HYDROCODONE-HOMATROPINE 5-1.5 MG/5ML PO SYRP
5.0000 mL | ORAL_SOLUTION | Freq: Four times a day (QID) | ORAL | Status: DC | PRN
  Administered 2015-08-25: 5 mL via ORAL
  Filled 2015-08-20 (×2): qty 5

## 2015-08-20 MED ORDER — MEGESTROL ACETATE 400 MG/10ML PO SUSP
200.0000 mg | Freq: Two times a day (BID) | ORAL | Status: DC
  Administered 2015-08-21 – 2015-08-25 (×7): 200 mg via ORAL
  Filled 2015-08-20 (×2): qty 5
  Filled 2015-08-20 (×10): qty 10

## 2015-08-20 MED ORDER — LOPERAMIDE HCL 2 MG PO CAPS
4.0000 mg | ORAL_CAPSULE | Freq: Once | ORAL | Status: AC
Start: 2015-08-20 — End: 2015-08-20
  Administered 2015-08-20: 4 mg via ORAL
  Filled 2015-08-20: qty 2

## 2015-08-20 MED ORDER — PROCHLORPERAZINE MALEATE 10 MG PO TABS: 10.0000 mg | ORAL_TABLET | Freq: Four times a day (QID) | ORAL | Status: DC | PRN

## 2015-08-20 MED ORDER — SODIUM CHLORIDE 0.9 % IV SOLN
INTRAVENOUS | Status: DC
  Administered 2015-08-20: 1000 mL via INTRAVENOUS
  Administered 2015-08-21 – 2015-08-22 (×4): via INTRAVENOUS
  Administered 2015-08-22 – 2015-08-23 (×2): 125 mL/h via INTRAVENOUS
  Administered 2015-08-24: 02:00:00 via INTRAVENOUS

## 2015-08-20 MED ORDER — MORPHINE SULFATE ER 15 MG PO TBCR
15.0000 mg | EXTENDED_RELEASE_TABLET | Freq: Two times a day (BID) | ORAL | Status: DC
  Administered 2015-08-20 – 2015-08-25 (×11): 15 mg via ORAL
  Filled 2015-08-20 (×11): qty 1

## 2015-08-20 MED ORDER — IPRATROPIUM BROMIDE 0.02 % IN SOLN: 0.5000 mg | RESPIRATORY_TRACT | Status: DC | PRN

## 2015-08-20 MED ORDER — OXYCODONE HCL 5 MG PO TABS
5.0000 mg | ORAL_TABLET | ORAL | Status: DC | PRN
  Administered 2015-08-20 – 2015-08-25 (×9): 10 mg via ORAL
  Filled 2015-08-20 (×10): qty 2

## 2015-08-20 MED ORDER — OXYCODONE-ACETAMINOPHEN 5-325 MG PO TABS
2.0000 | ORAL_TABLET | Freq: Once | ORAL | Status: AC
  Administered 2015-08-20: 2 via ORAL

## 2015-08-20 MED ORDER — ONDANSETRON HCL 4 MG PO TABS
4.0000 mg | ORAL_TABLET | Freq: Four times a day (QID) | ORAL | Status: DC | PRN
  Administered 2015-08-21: 4 mg via ORAL
  Filled 2015-08-20: qty 1

## 2015-08-20 MED ORDER — ADULT MULTIVITAMIN W/MINERALS CH
1.0000 | ORAL_TABLET | Freq: Every day | ORAL | Status: DC
  Administered 2015-08-22 – 2015-08-24 (×3): 1 via ORAL
  Filled 2015-08-20 (×4): qty 1

## 2015-08-20 MED ORDER — BOOST PLUS PO LIQD
237.0000 mL | Freq: Three times a day (TID) | ORAL | Status: DC
  Administered 2015-08-24 – 2015-08-25 (×2): 237 mL via ORAL
  Filled 2015-08-20 (×23): qty 237

## 2015-08-20 MED ORDER — LIDOCAINE-PRILOCAINE 2.5-2.5 % EX CREA: 1.0000 "application " | TOPICAL_CREAM | CUTANEOUS | Status: DC | PRN

## 2015-08-20 MED ORDER — ZOLPIDEM TARTRATE 5 MG PO TABS
5.0000 mg | ORAL_TABLET | Freq: Every evening | ORAL | Status: DC | PRN
  Administered 2015-08-24: 5 mg via ORAL
  Filled 2015-08-20: qty 1

## 2015-08-20 MED ORDER — ACETAMINOPHEN 650 MG RE SUPP: 650.0000 mg | Freq: Four times a day (QID) | RECTAL | Status: DC | PRN

## 2015-08-20 MED ORDER — SODIUM CHLORIDE 0.9 % IV BOLUS (SEPSIS)
500.0000 mL | Freq: Once | INTRAVENOUS | Status: AC
  Administered 2015-08-20: 500 mL via INTRAVENOUS

## 2015-08-20 NOTE — ED Notes (Addendum)
Patient transported by [facility] from home.  Patient c/o generalized weakness and loose black stools x 2 weeks, worsening since onset. Patient also c/o upper abdominal pain. Patient has hx of colon cancer with mets - pt last chemo 4-76mos ago.

## 2015-08-20 NOTE — ED Notes (Signed)
Bed: WA05 Expected date:  Expected time:  Means of arrival:  Comments: EMS 

## 2015-08-20 NOTE — H&P (Signed)
[facility] History and Physical  Michaeljames Milnes ZOX:[id number] DOB: 1955-06-25 DOA: 09/06/2015  Referring physician: Dr Benay Spice PCP: MATTHEWS,MICHELLE A., MD   Chief Complaint: Diarrhea/ weakness  HPI: Grant [name] is a 60 y.o. male  With history of metastatic rectal cancer currently on salvage chemotherapy, microcytic anemia, status post colostomy and urostomy who completed another cycle of chemotherapy beginning 08/11/2015 who presents to oncologist office with ongoing diarrhea which developed on the day of chemotherapy that has persisted despite Imodium or Lomotil with pain in the sacrum region and in both legs. Patient noted to be significantly weak with decreased oral intake however drinking fluids. Patient denies any fevers. Patient does endorse some chills, nausea, emesis on the morning of admission, chronic diffuse abdominal pain, nonproductive cough, no melena, no hematemesis, no hematochezia, no bleeding. Patient does endorse some loose greenish stool. Patient also some complaints of shortness of breath. Patient denies any chest pain. Due to patient's frail state and dehydration as well as poor oral intake and diarrhea tried hospice were called to admit the patient for further evaluation and management. Labs obtained and oncologist office had a comprehensive metabolic profile with a sodium of 134 chloride of 97 alk phosphatase of 171 albumin of 2.9 otherwise was within normal limits. CBC done did have a white count of 0.5 hemoglobin of 9.6 MCV of 79.9 daily count of 277.   Review of Systems: As per history of present illness otherwise negative. Constitutional:  No weight loss, night sweats, Fevers, chills, fatigue.  HEENT:  No headaches, Difficulty swallowing,Tooth/dental problems,Sore throat,  No sneezing, itching, ear ache, nasal congestion, post nasal drip,  Cardio-vascular:  No chest pain, Orthopnea, PND, swelling in lower extremities, anasarca, dizziness,  palpitations  GI:  No heartburn, indigestion, abdominal pain, nausea, vomiting, diarrhea, change in bowel habits, loss of appetite  Resp:  No shortness of breath with exertion or at rest. No excess mucus, no productive cough, No non-productive cough, No coughing up of blood.No change in color of mucus.No wheezing.No chest wall deformity  Skin:  no rash or lesions.  GU:  no dysuria, change in color of urine, no urgency or frequency. No flank pain.  Musculoskeletal:  No joint pain or swelling. No decreased range of motion. No back pain.  Psych:  No change in mood or affect. No depression or anxiety. No memory loss.   Past Medical History  Diagnosis Date  . Fecal incontinence   . Chronic kidney disease     right stent  . Hx of radiation therapy 12/18/13-01/29/14    RECTAL / 55gy  . Rectal cancer (Post)  10/11/13 bx     rectum adenocarcinoma   . Cancer ([place]) 10/11/13    rectal cancer-adenocarcinoma   Past Surgical History  Procedure Laterality Date  . Mandible surgery  1983  . Colonoscopy N/A 10/11/2013    Procedure: COLONOSCOPY;  Surgeon: [name], MD;  Location: [facility];  Service: Endoscopy;  Laterality: N/A;  . Colon resection N/A 10/15/2013    Procedure: LAP ASSISTED  LOOP COLOSTOMY;  Surgeon: Harl Bowie, MD;  Location: [facility];  Service: General;  Laterality: N/A;  . Nephrostomy tube Right 10/18/13    with stent ,tube removed  . Cystoscopy with ureteroscopy Right 05/13/2014    Procedure: CYSTOSCOPY WITH URETEROSCOPY;  Surgeon: Ardis Hughs, MD;  Location: [facility];  Service: Urology;  Laterality: Right;  CYSTOSCOPY/ LITHOPAXY/RIGHT URETERAL STENT EXCHANGE  . Holmium laser application N/A 81/19/1478    Procedure: HOLMIUM LASER APPLICATION;  Surgeon: Ardis Hughs, MD;  Location: [facility];  Service: Urology;  Laterality: N/A;   Social History:  reports that he quit smoking about 27 years ago. His smokeless tobacco use includes Snuff. He reports that he drinks  alcohol. He reports that he uses illicit drugs (Marijuana).  Allergies  Allergen Reactions  . Strawberry Extract Anaphylaxis and Hives  . Sunflower Oil Anaphylaxis and Hives    seeds  . Watermelon [Citrullus Vulgaris] Anaphylaxis and Hives  . Gabapentin     Other reaction(s): Other (See Comments) Pt states a few months ago he was prescribed this medication, it caused him to have both auditory and visual hallucinations  . Other     All Melons cantaloupes, etc    Family History  Problem Relation Age of Onset  . Cancer Father     pancreatic   . CAD Father   . Cancer Maternal Uncle   . Colon cancer Mother   . Pancreatic cancer Mother      Prior to Admission medications   Medication Sig Start Date End Date Taking? Authorizing Provider  ALPRAZolam (XANAX) 1 MG tablet TAKE 1 TABLET BY MOUTH TWICE A DAY AS NEEDED FOR ANXIETY Patient taking differently: Take 1 mg by mouth 2 (two) times daily.  08/19/15  Yes [name], MD  diphenoxylate-atropine (LOMOTIL) 2.5-0.025 MG tablet Take 1-2 tablets by mouth 4 (four) times daily as needed for diarrhea or loose stools. 08/19/15  Yes [name], MD  lactose free nutrition (BOOST PLUS) LIQD Take 237 mLs by mouth 4 (four) times daily -  with meals and at bedtime. From four to eight times daily. 05/22/14  Yes [name], MD  lidocaine-prilocaine (EMLA) cream Apply 1 application topically as needed. Apply to [facility] site 1-2 hours prior to stick and cover with plastic wrap 03/12/14  Yes [name], MD  loperamide (IMODIUM A-D) 2 MG tablet Take 2 mg by mouth 4 (four) times daily as needed for diarrhea or loose stools.   Yes Historical Provider, MD  megestrol (MEGACE) 400 MG/10ML suspension Take 5 mLs (200 mg total) by mouth 2 (two) times daily. 05/20/15  Yes [name], MD  morphine (MS CONTIN) 15 MG 12 hr tablet Take 1 tablet (15 mg total) by mouth every 12 (twelve) hours. 08/11/15  Yes [name], MD  Multiple Vitamins-Minerals ([employer]  SPECTRAVITE ULTRA MENS) TABS Take 1 tablet by mouth every morning.  10/20/13  Yes Historical Provider, MD  ondansetron (ZOFRAN) 4 MG tablet Take 1 tablet (4 mg total) by mouth every 8 (eight) hours as needed for nausea or vomiting. 11/14/14  Yes [name], MD  oxyCODONE (OXY IR/ROXICODONE) 5 MG immediate release tablet Take 1-2 tablets every 4 hours as needed for pain. 07/29/15  Yes Owens Shark, NP  prochlorperazine (COMPAZINE) 10 MG tablet Take 1 tablet (10 mg total) by mouth every 6 (six) hours as needed for nausea or vomiting. 08/25/14  Yes [name], MD  minocycline (MINOCIN) 100 MG capsule Take 1 capsule (100 mg total) by mouth 2 (two) times daily. Patient not taking: Reported on 08/15/2015 06/03/15   Owens Shark, NP   Physical Exam: Filed Vitals:   08/21/2015 1512  BP: 124/95  Pulse: 107  Temp: 97.6 F (36.4 C)  TempSrc: Oral  Resp: 20  SpO2: 97%    Wt Readings from Last 3 Encounters:  08/11/15 60.737 kg (133 lb 14.4 oz)  07/29/15 65.034 kg (143 lb  6 oz)  07/14/15 64.365 kg (141 lb 14.4 oz)    General:   cachectic, frail appearing gentleman in no acute cardiopulmonary distress. Speaking in full sentences.  Eyes: PERRLA, EOMI, normal lids, irises & conjunctiva ENT: grossly normal hearing, lips & tongue.Dry mucous membranes.  Neck: no LAD, masses or thyromegaly Cardiovascular: tachycardia, no m/r/g. No LE edema. Respiratory: CTA bilaterally, no w/r/r. Normal respiratory effort. Abdomen: soft, ntnd, positive bowel sounds, colostomy site clean dry intact with greenish loose stool noted in colostomy bag. Urostomy bag clean dry and intact.  Skin:  some petechiae noted over the face.  Musculoskeletal: grossly normal tone BUE/BLE Psychiatric: grossly normal mood and affect, speech fluent and appropriate Neurologic:  Alert and oriented 3. Cranial nerves II through XII grossly intact. No focal deficits.           Labs on Admission:  Basic Metabolic Panel:  Recent Labs Lab  09/11/2015 1438  NA 134*  K 4.1  CO2 25  GLUCOSE 127  BUN 17.8  CREATININE 1.3  CALCIUM 9.5   Liver Function Tests:  Recent Labs Lab 09/05/2015 1438  AST 9  ALT <9  ALKPHOS 171*  BILITOT 0.88  PROT 6.4  ALBUMIN 2.9*   No results for input(s): LIPASE, AMYLASE in the last 168 hours. No results for input(s): AMMONIA in the last 168 hours. CBC:  Recent Labs Lab 08/16/2015 1438  WBC 0.5*  NEUTROABS 0.2*  HGB 9.6*  HCT 29.1*  MCV 79.9  PLT 277   Cardiac Enzymes: No results for input(s): CKTOTAL, CKMB, CKMBINDEX, TROPONINI in the last 168 hours.  BNP (last 3 results) No results for input(s): BNP in the last 8760 hours.  ProBNP (last 3 results) No results for input(s): PROBNP in the last 8760 hours.  CBG: No results for input(s): GLUCAP in the last 168 hours.  Radiological Exams on Admission: No results found.  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 Patient with multiple episodes of diarrhea, poor oral intake and recently started another cycle of chemotherapy starting 08/11/2015. Patient is currently afebrile. No actual signs of infection however will check a chest x-ray, check blood cultures 2, UA with cultures and sensitivities, check stool for C. difficile PCR and GI pathogen panel. Place on IV fluids. Supportive care.  #2 diarrhea Likely secondary to recent chemotherapy. Patient however on minocycline per wife. Will check a C. difficile PCR. Check a GI pathogen panel. We'll give a dose of Imodium. A GI pathogen panel is negative may consider  octreotide as recommended per oncology. IV fluids. Supportive care.  #3 dehydration IV fluids.  #4 severe protein calorie malnutrition Nutritional supplementation.  #5 neutropenia Likely secondary to recent chemotherapy. Patient received a dose of Neulasta on 08/13/2015. Will panculture patient. Will hold off on any antibiotics at this time. Follow.  #6 ulcer of the sacral region  sacral area with wound with no discharge and no odor.wound care consult.  #7 metastatic rectal cancer/ s/p colostomy/s/p urostomy Per oncology.  #8 prophylaxis SCDs for DVT prophylaxis.  Code Status: Full DVT Prophylaxis: SCDs Family Communication: updated patient and wife at bedside. Disposition Plan: Admit to med surg  Time spent: 23 [place] MD [facility] Pager [phone]

## 2015-08-20 NOTE — Progress Notes (Signed)
[facility] OFFICE PROGRESS NOTE   Diagnosis:  Rectal cancer  INTERVAL HISTORY:    Mr. Foucault returns prior to a scheduled visit. He completed another cycle of FOLFIRI/panitumumab beginning 08/11/2015. He received Neulasta 08/13/2015.  He reports developing diarrhea beginning on the day of chemotherapy. The diarrhea has persisted. The diarrhea has not improved with Imodium or Lomotil.   he continues to have pain at the sacrum and in both legs. He reports drinking fluids, but is not eating. He is weak. No fever.  Objective:  Vital signs in last 24 hours:  Blood pressure 107/68, pulse 111, temperature 98.5 F (36.9 C), temperature source Oral, resp. rate 16, height 6\' 6"  (1.981 m), SpO2 96 %.    HEENT:  No thrush or ulcers Resp:  Lungs clear anteriorly Cardio:  Regular rate and rhythm GI:  Left lower quadrant colostomy with green liquid , right lower quadrant urostomy. Tender superior to the urostomy site Vascular:  No leg edema  Skin: mild acne type rash over the face  , sacral decubitus ulcers  Portacath/PICC-without erythema  Lab Results:  Lab Results  Component Value Date   WBC 0.5* 08/18/2015   HGB 9.6* 08/31/2015   HCT 29.1* 09/11/2015   MCV 79.9 09/03/2015   PLT 277 08/17/2015   NEUTROABS 0.2* 08/15/2015    potassium 4.1, creatinine 1.3, albumin 2.9   Lab Results  Component Value Date   CEA1 7.8* 07/29/2015     Medications: I have reviewed the patient's current medications.  Assessment/Plan: 1. Rectal cancer-large pelvic mass with a colonoscopy 10/11/2013 confirming an obstructing tumor at 9 cm from the anal verge  CTs of the chest, abdomen, and revealed a large complex pelvic mass, gas in the bladder, loculated gas posterior to the bladder, right hydronephrosis, and small bilateral indeterminant lung nodules.   Initiation of radiation and Xeloda 12/18/2013; completed 01/29/2014.  Restaging CT evaluation at [facility] showed decrease in  size of the rectosigmoid mass from 14.6 x 10.5 cm to 9.5 x 8.6 cm. Post radiation changes. Moderate right hydronephrosis with a double-J ureteral stent in place and mild left hydronephrosis, unchanged. Pulmonary nodules unchanged. Circumferential bladder wall thickening likely secondary to radiation. Rectosigmoid mass abuts the posterior bladder and involvement cannot be excluded.  Cycle 1 Capox beginning 03/13/2014  Cycle 2 CAPOX beginning 04/03/2014.  Cycle 3 CAPOX beginning 04/24/2014.  CT 05/13/2014 confirming a decrease in the rectal mass  Pelvic exoneration at [facility] on 06/24/2014 confirming a ypT4,ypN0 tumor. The anterior and posterior cervical frontal margins were less than 1 mm, tumor involved the bladder and left ureter. Surgically included an APR, cystectomy, ileal conduit, intraoperative radiation, and sacrectomy, and vertical rectus myocutaneous flap  Cycle 1 adjuvant CAPOX 08/25/2014  Cycle 2 adjuvant CAPOX 09/15/2014  Cycle 3 adjuvant CAPOX 10/06/2014  Cycle 4 adjuvant CAPOX 10/27/2014  CT abdomen/pelvis at [facility] 11/16/2014 with chronic right hydronephrosis, nodular collection of soft tissue in the right pelvis consistent with post surgical change, though recurrent disease cannot be excluded  CT at [facility] 02/09/2015 with increased fullness of the right renal pelvis and enlargement of a lung nodule  Biopsy of the right renal pelvis 02/13/2015 confirmed metastatic colorectal cancer  Staging PET scan 03/05/2015 consistent with multiple lung metastases, a hepatic abscess, and increased FDG uptake at the tip of the sacrum  Restaging CT scans 06/02/2015 showed mild progression of pulmonary nodules; resolution of right hepatic abscess; significant enlargement of a right renal mass; extensive surgical changes; increasing soft tissue fullness  about the coccyx suspicious for direct involvement and/or osseous metastasis; ill definition of the right sacrum suspicious for direct tumor  involvement and/or osseous metastasis.  Cycle 1 FOLFIRI/panitumumab 06/09/2015  Cycle 2 FOLFIRI/PANITUMUMAB 06/30/2015, Neulasta added  Cycle 3 FOLFIRI/PANITUMUMAB 07/14/2015, with Neulasta  Cycle 4 FOLFIRI/PANITUMUMAB 07/29/2015 with Neulasta  cycle 5 FOLFIRI/panitumumab 08/11/2015 with Neulasta  2. Right hydronephrosis secondary to the obstructing pelvic mass, status post placement of a right percutaneous nephrostomy tube followed by a right ureter stent.  Persistent hydronephrosis with a new right kidney cystic lesion on a CT 05/13/2014  Right renal parenchymal abscess and bladder stone confirmed on a cystoscopy 05/13/2014, status post a cystolithalopaxy, right ureter stent exchange  Placement of a percutaneous right renal abscess drain on 05/15/2014  CT abdomen 05/20/2014 showed near-complete drainage of the right renal abscess. The drainage catheter has been removed.  He completed a 6 week course of Levaquin. 3. Microcytic anemia-likely iron deficiency anemia secondary to bleeding from the rectal and hematuria  4. Possible colovesical fistula-on the CT 10/09/2013 evaluated by Dr. Alinda Money, followup CT cystogram 10/18/2013 revealed no fistula.  5. Anorexia/weight loss secondary to #1. 6. Indeterminate lung nodules on a CT of the chest 10/09/2013. Stable on CT 03/03/2014. 7. Family history of pancreas and colon cancer. 8. 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 9. History of a Colovesical fistula. 10. Hospitalization 12/10/2013 through 12/12/2013 presenting with diarrhea. CT abdomen/pelvis showed an enlarging rectal mass filling almost the entire pelvis. Air noted in the bladder and renal collecting system. Linear collection of air noted along the anterior wall of the colorectal mass abutting the bladder felt to possibly represent a site of fistulization. Stool noted throughout the right colon. Transferred to [facility]. Neoadjuvant chemotherapy/radiation recommended. 11.  history ofSkin toxicity at  the perineum related to radiation and Xeloda. 12. Pneumonia on the CT 05/20/2014. He completed a course of Levaquin. 13. Right greater than left leg and foot numbness following the sacrectomy 14. Sacrum pain following the APR/ sacrectomy, persistent pain in the sacrum with increased FDG activity at the tip of the sacrum on a PET scan 03/05/2015 15. Enterococcus faecalis urinary tract infection and C. difficile colitis 11/16/2014 16. Hepatic abscess on the PET scan 03/05/2015-status post drainage followed by a course of antibiotics 17. Neutropenia secondary to chemotherapy-chemotherapy held 06/23/2015, Neulasta added with cycle 2 FOLFIRI/panitumumab 18.  diarrhea-likely secondary to irinotecan and panitumumab 19.  Severe neutropenia secondary to chemotherapy     Disposition:   [name] has metastatic rectal cancer. He is now at day 10 following cycle 5 FOLFIRI/ panitumumab. He is scheduled for restaging CT scans tomorrow. He presents with refractory diarrhea and severe neutropenia. I suspect the diarrhea is related to chemotherapy.   he will be admitted for intravenous hydration and supportive care measures. Dr. Ree Kida has graciously accepted him for hospital admission.    recommendations:  1.  Intravenous hydration 2.  Imodium/Lomotil, add  Octreotide for persistent severe diarrhea 3.  Blood/urine culture and broad-spectrum intravenous antibiotics for a fever 4.  Skin care evaluation for the sacral decubitus ulcers 5.   Delay restaging CTs    I appreciate the care from Dr. Ree Kida. I will check on Mr. Reymond 08/21/2015. Betsy Coder, MD  08/18/2015  4:05 PM

## 2015-08-20 NOTE — Telephone Encounter (Signed)
Grant [name] called requesting an appt with Dr. Benay Spice.  Despite using lomotil patient continues with diarrhea.  He is also nauseated and has been vomiting.  Per Lavella Lemons, RN patient is to come in with labs, Dr. Benay Spice appt and fluids.  Urgent POF sent, Diane, RN in infusion placed patient on the schedule for 1:00

## 2015-08-20 NOTE — Progress Notes (Signed)
Nursing Note: Paged on-call BP 88/57 P-93.Pt received an IV bolus of NS on previous shift.Pt resting quietly in bed. No distress, no complaints.wbb

## 2015-08-20 NOTE — Plan of Care (Signed)
Received a call from Dr. Malachy Mood for direct admission. This is a 60 year old male with history of metastatic rectal cancer currently on salvage chemotherapy.  Patient received chemotherapy last week and started having diarrhea afterwards. He presented to Dr. Juanda Crumble office today and appears to be failure to thrive and very dehydrated. Vital signs are stable. Patient will be admitted to medical floor.  Time spent: 5 minutes  Jamayah Myszka D.O. [facility] Pager [phone]  If 7PM-7AM, please contact night-coverage [email or web address] Password [email or web address] 08/17/2015, 2:22 PM

## 2015-08-20 NOTE — Progress Notes (Signed)
Utilization Review completed.  Shaquila Sigman RN CM  

## 2015-08-20 NOTE — Progress Notes (Signed)
Orders received.NS 500 bolus infusing.wbb

## 2015-08-20 NOTE — Addendum Note (Signed)
Addended by: Betsy Coder B on: 08/25/2015 04:14 PM   Modules accepted: Level of Service

## 2015-08-20 NOTE — ED Notes (Signed)
Family reports patient was supposed to go directly to cancer center.  Called cancer center to confirm.  Cancer center nurse will call back momentarily after speaking with Dr. Benay Spice.

## 2015-08-20 NOTE — Telephone Encounter (Signed)
"  This is Grant [name] calling to let you all know he is too weak to get in the car.  I'm calling an ambulance for transport.  He's on the way he'll just be coming by ambulance."

## 2015-08-20 NOTE — ED Notes (Signed)
Patient to go to cancer center for treatment.  Will transport patient.

## 2015-08-21 ENCOUNTER — Ambulatory Visit (HOSPITAL_COMMUNITY): Payer: Medicaid Other

## 2015-08-21 DIAGNOSIS — N135 Crossing vessel and stricture of ureter without hydronephrosis: Secondary | ICD-10-CM

## 2015-08-21 DIAGNOSIS — R197 Diarrhea, unspecified: Secondary | ICD-10-CM

## 2015-08-21 DIAGNOSIS — D701 Agranulocytosis secondary to cancer chemotherapy: Secondary | ICD-10-CM

## 2015-08-21 DIAGNOSIS — N390 Urinary tract infection, site not specified: Secondary | ICD-10-CM | POA: Diagnosis present

## 2015-08-21 DIAGNOSIS — N3001 Acute cystitis with hematuria: Secondary | ICD-10-CM

## 2015-08-21 DIAGNOSIS — G893 Neoplasm related pain (acute) (chronic): Secondary | ICD-10-CM

## 2015-08-21 DIAGNOSIS — C20 Malignant neoplasm of rectum: Secondary | ICD-10-CM

## 2015-08-21 DIAGNOSIS — D509 Iron deficiency anemia, unspecified: Secondary | ICD-10-CM

## 2015-08-21 LAB — GASTROINTESTINAL PANEL BY PCR, STOOL (REPLACES STOOL CULTURE)
ASTROVIRUS: NOT DETECTED
Adenovirus F40/41: NOT DETECTED
Campylobacter species: NOT DETECTED
Cryptosporidium: NOT DETECTED
Cyclospora cayetanensis: NOT DETECTED
E. coli O157: NOT DETECTED
ENTAMOEBA HISTOLYTICA: NOT DETECTED
ENTEROAGGREGATIVE E COLI (EAEC): NOT DETECTED
ENTEROTOXIGENIC E COLI (ETEC): NOT DETECTED
Enteropathogenic E coli (EPEC): NOT DETECTED
GIARDIA LAMBLIA: NOT DETECTED
NOROVIRUS GI/GII: NOT DETECTED
Plesimonas shigelloides: NOT DETECTED
Rotavirus A: NOT DETECTED
Salmonella species: NOT DETECTED
Sapovirus (I, II, IV, and V): NOT DETECTED
Shiga like toxin producing E coli (STEC): NOT DETECTED
Shigella/Enteroinvasive E coli (EIEC): NOT DETECTED
VIBRIO CHOLERAE: NOT DETECTED
Vibrio species: NOT DETECTED
Yersinia enterocolitica: NOT DETECTED

## 2015-08-21 LAB — URINE CULTURE

## 2015-08-21 LAB — BASIC METABOLIC PANEL
ANION GAP: 11 (ref 5–15)
BUN: 16 mg/dL (ref 6–20)
CHLORIDE: 105 mmol/L (ref 101–111)
CO2: 23 mmol/L (ref 22–32)
Calcium: 8.9 mg/dL (ref 8.9–10.3)
Creatinine, Ser: 1.15 mg/dL (ref 0.61–1.24)
GFR calc Af Amer: >60 mL/min (ref >60)
Glucose, Bld: 105 mg/dL — ABNORMAL HIGH (ref 65–99)
POTASSIUM: 3.6 mmol/L (ref 3.5–5.1)
SODIUM: 139 mmol/L (ref 135–145)

## 2015-08-21 LAB — CBC
HEMATOCRIT: 26.4 % — AB (ref 39.0–52.0)
HEMOGLOBIN: 8.5 g/dL — AB (ref 13.0–17.0)
MCH: 26.4 pg (ref 26.0–34.0)
MCHC: 32.2 g/dL (ref 30.0–36.0)
MCV: 82 fL (ref 78.0–100.0)
Platelets: 251 10*3/uL (ref 150–400)
RBC: 3.22 MIL/uL — AB (ref 4.22–5.81)
RDW: 17.2 % — AB (ref 11.5–15.5)
WBC: 0.6 10*3/uL — AB (ref 4.0–10.5)

## 2015-08-21 LAB — GLUCOSE, CAPILLARY
Glucose-Capillary: 104 mg/dL — ABNORMAL HIGH (ref 65–99)
Glucose-Capillary: 132 mg/dL — ABNORMAL HIGH (ref 65–99)

## 2015-08-21 LAB — MAGNESIUM: Magnesium: 1.4 mg/dL — ABNORMAL LOW (ref 1.7–2.4)

## 2015-08-21 MED ORDER — MORPHINE SULFATE 2 MG/ML IJ SOLN: 4.0000 mg | INTRAMUSCULAR | Status: DC | PRN

## 2015-08-21 MED ORDER — DIPHENOXYLATE-ATROPINE 2.5-0.025 MG PO TABS
1.0000 | ORAL_TABLET | Freq: Four times a day (QID) | ORAL | Status: DC | PRN
  Administered 2015-08-21: 2 via ORAL
  Filled 2015-08-21: qty 1
  Filled 2015-08-21: qty 2

## 2015-08-21 MED ORDER — MORPHINE SULFATE (PF) 4 MG/ML IV SOLN
4.0000 mg | INTRAVENOUS | Status: DC | PRN
  Administered 2015-08-21 – 2015-08-24 (×10): 4 mg via INTRAVENOUS
  Filled 2015-08-21 (×11): qty 1

## 2015-08-21 MED ORDER — MAGNESIUM SULFATE 4 GM/100ML IV SOLN
4.0000 g | Freq: Once | INTRAVENOUS | Status: AC
  Administered 2015-08-21: 4 g via INTRAVENOUS
  Filled 2015-08-21: qty 100

## 2015-08-21 MED ORDER — DEXTROSE 5 % IV SOLN
1.0000 g | INTRAVENOUS | Status: DC
  Administered 2015-08-21: 1 g via INTRAVENOUS
  Filled 2015-08-21 (×2): qty 10

## 2015-08-21 MED ORDER — MORPHINE SULFATE 2 MG/ML IJ SOLN
2.0000 mg | INTRAMUSCULAR | Status: DC | PRN
  Administered 2015-08-21: 4 mg via INTRAVENOUS
  Administered 2015-08-21: 2 mg via INTRAVENOUS
  Filled 2015-08-21 (×4): qty 2

## 2015-08-21 MED ORDER — SACCHAROMYCES BOULARDII 250 MG PO CAPS
250.0000 mg | ORAL_CAPSULE | Freq: Two times a day (BID) | ORAL | Status: DC
  Administered 2015-08-21 – 2015-08-27 (×11): 250 mg via ORAL
  Filled 2015-08-21 (×11): qty 1

## 2015-08-21 MED ORDER — COLLAGENASE 250 UNIT/GM EX OINT
TOPICAL_OINTMENT | Freq: Every day | CUTANEOUS | Status: DC
  Administered 2015-08-22 – 2015-08-28 (×7): via TOPICAL
  Filled 2015-08-21: qty 30

## 2015-08-21 MED ORDER — LOPERAMIDE HCL 2 MG PO CAPS
4.0000 mg | ORAL_CAPSULE | Freq: Four times a day (QID) | ORAL | Status: DC
  Administered 2015-08-21 – 2015-08-24 (×13): 4 mg via ORAL
  Administered 2015-08-24: 2 mg via ORAL
  Administered 2015-08-24 – 2015-08-25 (×3): 4 mg via ORAL
  Filled 2015-08-21 (×19): qty 2

## 2015-08-21 NOTE — Progress Notes (Signed)
Nursing Note: Passed on to Josph Macho, that pt may need bladder scan,Output was only 350 cc for this shift.At bedside and just changed colostomy and bed.wbb

## 2015-08-21 NOTE — Progress Notes (Signed)
Nursing Note: Received  critical value of wbc of .6 from lab per protocol. Result yesterday was .5.MD already aware of result yesterday.wbb

## 2015-08-21 NOTE — Progress Notes (Signed)
Nursing Note: After IVF bolus,BP is 93/62.Pt resting quietly in bed.wbb

## 2015-08-21 NOTE — Progress Notes (Signed)
CRITICAL VALUE ALERT  Critical value received:  Wbc-0.6  Date of notification:  08/21/15  Time of notification:  [id number]  Critical value read back:yes  Nurse who received alert:  Cyd Silence Ferdinando Lodge,rn  MD notified (1st page):  n/a previous result was .5  Time of first page:  n/a  MD notified (2nd page):n/a  Time of second page:n/a  Responding MD:  n/a  Time MD responded:   n/a

## 2015-08-21 NOTE — Progress Notes (Signed)
Initial Nutrition Assessment  DOCUMENTATION CODES:   Underweight, Severe malnutrition in context of chronic illness  INTERVENTION:  -RD continue to monitor -Mr. Costales declined ONS & Snacks -> not feeling well -D/C BOOST TID.  NUTRITION DIAGNOSIS:   Malnutrition related to chronic illness as evidenced by severe depletion of muscle mass, severe depletion of body fat.  GOAL:   Patient will meet greater than or equal to 90% of their needs  MONITOR:   PO intake, Labs, I & O's, Skin  REASON FOR ASSESSMENT:   Consult Assessment of nutrition requirement/status  ASSESSMENT:   Grant [name] 60 yo male With history of metastatic rectal cancer currently on salvage chemotherapy, microcytic anemia, status post colostomy and urostomy who completed another cycle of chemotherapy beginning 08/11/2015 who presents to oncologist office with ongoing diarrhea which developed on the day of chemotherapy that has persisted despite Imodium or Lomotil with pain in the sacrum region and in both legs.  Grant [name] is a pleasant 60 yo man who is severely malnourished, reports poor appetite x1 week. Per patient -> 45# weight loss in 4 months. Per chart 37#/22% severe wt loss 7 months.  Nutrition-Focused physical exam completed. Findings are severe fat depletion, severe muscle depletion, and no edema.   He claims he was eating ok up until 1 week ago. During time of visit he was rubbing chest constantly, stated he was suffering from some reflux. I offered him ONS and snacks but patient states "I'm getting by with coke and water." Brought patient another coke. Per MD note pt struggles with dehydration r/t rectal CA.  Unfortunately, there doesn't seem to be much we can do for him at this time.  Labs reviewed. Medications: Megace, Morphine  Diet Order:  DIET SOFT Room service appropriate?: Yes; Fluid consistency:: Thin  Skin:  Wound (see comment) (medical healing wound to sacrum)  Last BM:   4/6  Height:   Ht Readings from Last 1 Encounters:  09/05/2015 6\' 6"  (1.981 m)    Weight:   Wt Readings from Last 1 Encounters:  08/21/15 131 lb 2.8 oz (59.5 kg)    Ideal Body Weight:  97.27 kg  BMI:  Body mass index is 15.16 kg/(m^2).  Estimated Nutritional Needs:   Kcal:  2000-2400 (35-40 cal/kg)  Protein:  80-100 grams  Fluid:  >/= 2L  EDUCATION NEEDS:   No education needs identified at this time  Satira Anis. Starnisha Batrez, MS, RD LDN After Hours/Weekend Pager [phone]

## 2015-08-21 NOTE — Progress Notes (Signed)
[facility] PROGRESS NOTE  Grant [name] [id number] DOB: December 21, 1955 DOA: 09/12/2015 PCP: MATTHEWS,MICHELLE A., MD  Assessment/Plan: #1 failure to thrive Likely secondary to metastatic renal cancer status post cycle 5 FOLFIRI/panitumumab 08/11/2015. Patient has been pancultured. Urinalysis worrisome for UTI and as such patient has been placed empirically on IV Rocephin. Continue IV fluids, antiemetics, supportive care.  #2 diarrhea Likely chemotherapy induced. C. difficile PCR is negative. GI pathogen panel is negative. Imodium dose has been changed to 4 mg every 6 hours. Lomotil as needed. Per oncology is no significant improvement with diarrhea may consider oral she'll diet. Follow.  #3 hypomagnesemia Replete.  #4 dehydration IV fluids.  #5 neutropenia Secondary to recent chemotherapy. Patient status post Neulasta. Patient has been pancultured. Urinalysis worrisome for urinary tract infection a such patient was placed empirically on IV Rocephin. Follow.  #6 severe protein calorie malnutrition Nutritional supplementation.  #7 ulcer of the sacral region Wound care consult pending. Continue current pain regimen.  #8 probable UTI Urine cultures pending. IV Rocephin.  #9 metastatic renal cell cancer Status post cycle 5 FOLFIRI/panitumumab 08/11/2015. Continue current pain regimen. Per oncology.  #10 history of right hydronephrosis/right renal abscess/status post placement of right ureteral stent  Stable. Good urine output. Follow.  #11 prophylaxis SCDs for DVT prophylaxis.  Code Status: Full Family Communication: updated patient. No family at bedside. Disposition Plan: Remain inpatient.   Consultants:  Oncology: Dr. Benay Spice 08/27/2015  Procedures:  Chest x-ray 08/22/2015  Antibiotics:  IV Rocephin 08/20/2012  HPI/Subjective: Patient states no significant change since yesterday. Patient complaining of nausea. Patient states does not feel like eating  secondary to nausea. Patient still has diarrhea and colostomy bag.  Objective: Filed Vitals:   08/21/15 0021 08/21/15 0715  BP: 93/62 110/65  Pulse:  107  Temp:  98.5 F (36.9 C)  Resp:  16    Intake/Output Summary (Last 24 hours) at 08/21/15 1056 Last data filed at 08/21/15 1000  Gross per 24 hour  Intake      0 ml  Output   1125 ml  Net  -1125 ml   Filed Weights   08/21/15 0715  Weight: 59.5 kg (131 lb 2.8 oz)    Exam:   General:  NAD. Cachetic  Cardiovascular: regular rate and rhythm   Respiratory: clear to auscultation bilaterally anterior lung fields.  Abdomen: soft, nontender, nondistended, positive bowel sounds. Urostomy bag intact. Patient changing colostomy bag.   Musculoskeletal:  no clubbing cyanosis or edema.    Data Reviewed: Basic Metabolic Panel:  Recent Labs Lab 08/27/2015 1438 08/28/2015 1906 08/21/15 0500  NA 134*  --  139  K 4.1  --  3.6  CL  --   --  105  CO2 25  --  23  GLUCOSE 127  --  105*  BUN 17.8  --  16  CREATININE 1.3  --  1.15  CALCIUM 9.5  --  8.9  MG  --  1.5* 1.4*  PHOS  --  2.6  --    Liver Function Tests:  Recent Labs Lab 09/13/2015 1438  AST 9  ALT <9  ALKPHOS 171*  BILITOT 0.88  PROT 6.4  ALBUMIN 2.9*   No results for input(s): LIPASE, AMYLASE in the last 168 hours. No results for input(s): AMMONIA in the last 168 hours. CBC:  Recent Labs Lab 09/08/2015 1438 08/21/15 0500  WBC 0.5* 0.6*  NEUTROABS 0.2*  --   HGB 9.6* 8.5*  HCT 29.1* 26.4*  MCV 79.9 82.0  PLT 277 251   Cardiac Enzymes: No results for input(s): CKTOTAL, CKMB, CKMBINDEX, TROPONINI in the last 168 hours. BNP (last 3 results) No results for input(s): BNP in the last 8760 hours.  ProBNP (last 3 results) No results for input(s): PROBNP in the last 8760 hours.  CBG:  Recent Labs Lab 08/21/15 0753  GLUCAP 104*    Recent Results (from the past 240 hour(s))  Culture, Urine     Status: None (Preliminary result)   Collection Time:  08/27/2015  6:00 PM  Result Value Ref Range Status   Specimen Description URINE, RANDOM  Final   Special Requests NONE  Final   Culture   Final    CULTURE REINCUBATED FOR BETTER GROWTH Performed at [facility]    Report Status PENDING  Incomplete  C difficile quick scan w PCR reflex     Status: None   Collection Time: 09/02/2015  6:00 PM  Result Value Ref Range Status   C Diff antigen NEGATIVE NEGATIVE Final   C Diff toxin NEGATIVE NEGATIVE Final   C Diff interpretation Negative for toxigenic C. difficile  Final  Gastrointestinal Panel by PCR , Stool     Status: None   Collection Time: 08/24/2015  6:00 PM  Result Value Ref Range Status   Campylobacter species NOT DETECTED NOT DETECTED Final   Plesimonas shigelloides NOT DETECTED NOT DETECTED Final   Salmonella species NOT DETECTED NOT DETECTED Final   Yersinia enterocolitica NOT DETECTED NOT DETECTED Final   Vibrio species NOT DETECTED NOT DETECTED Final   Vibrio cholerae NOT DETECTED NOT DETECTED Final   Enteroaggregative E coli (EAEC) NOT DETECTED NOT DETECTED Final   Enteropathogenic E coli (EPEC) NOT DETECTED NOT DETECTED Final   Enterotoxigenic E coli (ETEC) NOT DETECTED NOT DETECTED Final   Shiga like toxin producing E coli (STEC) NOT DETECTED NOT DETECTED Final   E. coli O157 NOT DETECTED NOT DETECTED Final   Shigella/Enteroinvasive E coli (EIEC) NOT DETECTED NOT DETECTED Final   Cryptosporidium NOT DETECTED NOT DETECTED Final   Cyclospora cayetanensis NOT DETECTED NOT DETECTED Final   Entamoeba histolytica NOT DETECTED NOT DETECTED Final   Giardia lamblia NOT DETECTED NOT DETECTED Final   Adenovirus F40/41 NOT DETECTED NOT DETECTED Final   Astrovirus NOT DETECTED NOT DETECTED Final   Norovirus GI/GII NOT DETECTED NOT DETECTED Final   Rotavirus A NOT DETECTED NOT DETECTED Final   Sapovirus (I, II, IV, and V) NOT DETECTED NOT DETECTED Final     Studies: X-ray Chest Pa And Lateral  09/12/2015  CLINICAL DATA:   Shortness of breath.  Rectal cancer EXAM: CHEST  2 VIEW COMPARISON:  06/02/2015 FINDINGS: There is a a right chest wall port a catheter with tip in the projection of the SVC. The heart size appears normal. No pleural effusion or edema. Bilateral nodular densities are again noted compatible with lung metastases. IMPRESSION: 1. No acute cardiopulmonary abnormalities. 2. Pulmonary metastasis. Electronically Signed   By: Kerby Moors M.D.   On: 09/06/2015 19:58    Scheduled Meds: . ALPRAZolam  1 mg Oral BID  . cefTRIAXone (ROCEPHIN)  IV  1 g Intravenous Q24H  . lactose free nutrition  237 mL Oral TID WC & HS  . loperamide  4 mg Oral QID  . magnesium sulfate 1 - 4 g bolus IVPB  4 g Intravenous Once  . megestrol  200 mg Oral BID  . morphine  15 mg Oral Q12H  . multivitamin with minerals  1 tablet Oral Daily  . saccharomyces boulardii  250 mg Oral BID   Continuous Infusions: . 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 [facility] Pager [phone]. If 7PM-7AM, please contact night-coverage at [email or web address], password [facility] 08/21/2015, 10:56 AM  LOS: 1 day

## 2015-08-21 NOTE — Progress Notes (Signed)
OT Cancellation Note  Patient Details Name: Altay Jackett MRN: [id number] DOB: March 05, 1956   Cancelled Treatment:    Reason Eval/Treat Not Completed: Fatigue/lethargy limiting ability to participate.  Will try to check back over the weekend, if schedule permits.  If not, we will see him Monday.  Jaaziah Schulke 08/21/2015, 2:40 PM  Lesle Chris, OTR/L [phone] 08/21/2015

## 2015-08-21 NOTE — Progress Notes (Signed)
PT Cancellation Note  Patient Details Name: Grant [name] MRN: [id number] DOB: [birth year]   Cancelled Treatment:    Reason Eval/Treat Not Completed: Fatigue/lethargy limiting ability to participate (pt reports he's too exhausted to attempt mobility today, will check back tomorrow. )   Philomena Doheny 08/21/2015, 2:09 PM [phone]

## 2015-08-21 NOTE — Progress Notes (Signed)
IP PROGRESS NOTE  Subjective:   He complains of pain at the sacrum and legs. He continues to have diarrhea.  Objective: Vital signs in last 24 hours: Blood pressure 110/65, pulse 107, temperature 98.5 F (36.9 C), temperature source Oral, resp. rate 16, height 6\' 6"  (1.981 m), weight 131 lb 2.8 oz (59.5 kg), SpO2 97 %.  Intake/Output from previous day: 04/06 0701 - 04/07 0700 In: -  Out: 325 [Stool:325]  Physical Exam:  HEENT: No thrush Lungs: Clear anteriorly, distant breath sounds Cardiac: Regular rate and rhythm Abdomen: Right lower quadrant urostomy, left lower quadrant colostomy with liquid stool Extremities: No leg edema Skin: Superficial sacral decubitus ulcers  Portacath/PICC-without erythema  Lab Results:  Recent Labs  09/11/2015 1438 08/21/15 0500  WBC 0.5* 0.6*  HGB 9.6* 8.5*  HCT 29.1* 26.4*  PLT 277 251    BMET  Recent Labs  09/08/2015 1438 08/21/15 0500  NA 134* 139  K 4.1 3.6  CL  --  105  CO2 25 23  GLUCOSE 127 105*  BUN 17.8 16  CREATININE 1.3 1.15  CALCIUM 9.5 8.9    Studies/Results: X-ray Chest Pa And Lateral  09/04/2015  CLINICAL DATA:  Shortness of breath.  Rectal cancer EXAM: CHEST  2 VIEW COMPARISON:  06/02/2015 FINDINGS: There is a a right chest wall port a catheter with tip in the projection of the SVC. The heart size appears normal. No pleural effusion or edema. Bilateral nodular densities are again noted compatible with lung metastases. IMPRESSION: 1. No acute cardiopulmonary abnormalities. 2. Pulmonary metastasis. Electronically Signed   By: Kerby Moors M.D.   On: 08/18/2015 19:58    Medications: I have reviewed the patient's current medications.  Assessment/Plan:  1. Metastatic rectal cancer, status post cycle 5 FOLFIRI/panitumumab 08/11/2015 2. History of right hydronephrosis, right renal abscess, status post placement of a right ureter stent 3. Pain at the sacrum and legs following the APR/sacrectomy, positive FDG at the  tip at the sacrum on a PET 03/05/2015 4. Diarrhea secondary to chemotherapy 5. Severe neutropenia secondary to chemotherapy 6. Second decubitus ulcer  Mr. Ell appears unchanged today. He continues to have diarrhea. We can maximize Imodium/Lomotil and if this is not helpful begin octreotide. He has persistent severe neutropenia secondary to chemotherapy. Hopefully the white cell count will recover over the next few days after receiving Neulasta.  Recommendations  1. Continue intravenous hydration, increase Imodium and Lomotil 2. Broad-spectrum intravenous antibiotics for a fever 3. Plan for restaging CT scans next few days if his clinical status improves 4. Continue MS Contin/oxycodone, and when necessary IV morphine for pain   LOS: 1 day   Betsy Coder, MD   08/21/2015, 8:44 AM

## 2015-08-22 DIAGNOSIS — B3749 Other urogenital candidiasis: Secondary | ICD-10-CM | POA: Diagnosis present

## 2015-08-22 LAB — MAGNESIUM: MAGNESIUM: 2 mg/dL (ref 1.7–2.4)

## 2015-08-22 LAB — DIFFERENTIAL
BASOS PCT: 1 %
Basophils Absolute: 0 10*3/uL (ref 0.0–0.1)
EOS ABS: 0 10*3/uL (ref 0.0–0.7)
Eosinophils Relative: 1 %
LYMPHS ABS: 0.4 10*3/uL — AB (ref 0.7–4.0)
LYMPHS PCT: 17 %
MONOS PCT: 4 %
Monocytes Absolute: 0.1 10*3/uL (ref 0.1–1.0)
NEUTROS ABS: 1.7 10*3/uL (ref 1.7–7.7)
NEUTROS PCT: 77 %

## 2015-08-22 LAB — CBC
HEMATOCRIT: 29.7 % — AB (ref 39.0–52.0)
HEMOGLOBIN: 9.6 g/dL — AB (ref 13.0–17.0)
MCH: 26.6 pg (ref 26.0–34.0)
MCHC: 32.3 g/dL (ref 30.0–36.0)
MCV: 82.3 fL (ref 78.0–100.0)
Platelets: 378 10*3/uL (ref 150–400)
RBC: 3.61 MIL/uL — AB (ref 4.22–5.81)
RDW: 17.7 % — ABNORMAL HIGH (ref 11.5–15.5)
WBC: 2.2 10*3/uL — AB (ref 4.0–10.5)

## 2015-08-22 LAB — GLUCOSE, CAPILLARY: GLUCOSE-CAPILLARY: 157 mg/dL — AB (ref 65–99)

## 2015-08-22 LAB — BASIC METABOLIC PANEL
ANION GAP: 13 (ref 5–15)
BUN: 12 mg/dL (ref 6–20)
CALCIUM: 8.7 mg/dL — AB (ref 8.9–10.3)
CHLORIDE: 100 mmol/L — AB (ref 101–111)
CO2: 22 mmol/L (ref 22–32)
Creatinine, Ser: 1.19 mg/dL (ref 0.61–1.24)
Glucose, Bld: 180 mg/dL — ABNORMAL HIGH (ref 65–99)
POTASSIUM: 3.3 mmol/L — AB (ref 3.5–5.1)
Sodium: 135 mmol/L (ref 135–145)

## 2015-08-22 MED ORDER — MORPHINE SULFATE (PF) 2 MG/ML IV SOLN
1.0000 mg | Freq: Once | INTRAVENOUS | Status: AC
  Administered 2015-08-22: 1 mg via INTRAVENOUS
  Filled 2015-08-22: qty 1

## 2015-08-22 MED ORDER — PROCHLORPERAZINE EDISYLATE 5 MG/ML IJ SOLN
10.0000 mg | Freq: Four times a day (QID) | INTRAMUSCULAR | Status: DC | PRN
  Administered 2015-08-22: 10 mg via INTRAVENOUS
  Filled 2015-08-22: qty 2

## 2015-08-22 MED ORDER — ALUM & MAG HYDROXIDE-SIMETH 200-200-20 MG/5ML PO SUSP
15.0000 mL | ORAL | Status: DC | PRN
  Administered 2015-08-22: 15 mL via ORAL
  Filled 2015-08-22: qty 30

## 2015-08-22 MED ORDER — FLUCONAZOLE IN SODIUM CHLORIDE 200-0.9 MG/100ML-% IV SOLN
200.0000 mg | INTRAVENOUS | Status: DC
  Administered 2015-08-22 – 2015-08-27 (×6): 200 mg via INTRAVENOUS
  Filled 2015-08-22 (×8): qty 100

## 2015-08-22 MED ORDER — ONDANSETRON HCL 4 MG/2ML IJ SOLN
4.0000 mg | Freq: Three times a day (TID) | INTRAMUSCULAR | Status: AC
  Administered 2015-08-22 – 2015-08-23 (×3): 4 mg via INTRAVENOUS
  Filled 2015-08-22 (×3): qty 2

## 2015-08-22 MED ORDER — POTASSIUM CHLORIDE CRYS ER 20 MEQ PO TBCR
40.0000 meq | EXTENDED_RELEASE_TABLET | Freq: Once | ORAL | Status: AC
  Administered 2015-08-22: 40 meq via ORAL
  Filled 2015-08-22: qty 2

## 2015-08-22 NOTE — Progress Notes (Signed)
PT Cancellation Note  Patient Details Name: Tajohn Talbert MRN: [id number] DOB: 07-23-1955   Cancelled Treatment:     Pt defered PT again today.  "I'm just wore out"  He agreed to our checking back with him next week in the hopes that he is feeling better.  Donato Heinz. Owens Shark, PT   08/22/2015, 1:49 PM

## 2015-08-22 NOTE — Progress Notes (Signed)
OT Cancellation Note  Patient Details Name: Grant [name] MRN: [id number] DOB: 05/25/1955   Cancelled Treatment:    Reason Eval/Treat Not Completed: Other (comment).  Noted pt declined PT within the hour due to fatique.  Will check back tomorrow or Monday  Korynn Kenedy 08/22/2015, 2:31 PM  Lesle Chris, OTR/L [id number] 08/22/2015

## 2015-08-22 NOTE — Progress Notes (Signed)
[facility] PROGRESS NOTE  Simone Burrington [id number] DOB: 03/10/1956 DOA: 08/26/2015 PCP: MATTHEWS,MICHELLE A., MD  Assessment/Plan: #1 failure to thrive Likely secondary to metastatic renal cancer status post cycle 5 FOLFIRI/panitumumab 08/11/2015. Patient has been pancultured. Urine cultures with greater than 100,000 yeast. Discontinue IV Rocephin. Place on Diflucan. Continue IV fluids, antiemetics, supportive care.  #2 diarrhea Likely chemotherapy induced. C. difficile PCR is negative. GI pathogen panel is negative. Imodium dose has been changed to 4 mg every 6 hours. Lomotil as needed. Patient states diarrhea improvement however this liquid stool still noted. Continue current regimen of Imodium. Oncology following.  #3 hypomagnesemia/hypokalemia Repleted. Replete potassium.  #4 dehydration IV fluids.  #5 neutropenia Secondary to recent chemotherapy. Patient status post Neulasta. Patient has been pancultured. WBC trending back up. Urine cultures with greater than 100,000 E. Discontinue IV Rocephin. Place on Diflucan.   #6 severe protein calorie malnutrition Nutritional supplementation.  #7 ulcer of the sacral region Wound care consult pending. Continue current pain regimen.  #8 Candiduria Urine cultures with greater than 100,000 yeast. DC IV Rocephin. Place on Diflucan secondary to immunocompromised state.  #9 metastatic renal cell cancer Status post cycle 5 FOLFIRI/panitumumab 08/11/2015. Continue current pain regimen. Per oncology.  #10 history of right hydronephrosis/right renal abscess/status post placement of right ureteral stent  Stable. Good urine output. Follow.  #11 prophylaxis SCDs for DVT prophylaxis.  Code Status: Full Family Communication: updated patient. No family at bedside. Disposition Plan: Remain inpatient.   Consultants:  Oncology: Dr. Benay Spice 08/29/2015  Procedures:  Chest x-ray 08/19/2015  Antibiotics: IV Rocephin  08/20/2012>>>>08/22/2015 Diflucan 08/22/2015  HPI/Subjective: Patient states diarrhea improving. Patient sitting up in bed complaining of nausea. Patient with poor oral intake.  Objective: Filed Vitals:   08/21/15 2105 08/22/15 0420  BP: 105/74 133/86  Pulse: 108 102  Temp: 97.9 F (36.6 C) 97.4 F (36.3 C)  Resp: 20 18    Intake/Output Summary (Last 24 hours) at 08/22/15 1216 Last data filed at 08/22/15 0900  Gross per 24 hour  Intake 4766.6 ml  Output    600 ml  Net 4166.6 ml   Filed Weights   08/21/15 0715 08/22/15 0420  Weight: 59.5 kg (131 lb 2.8 oz) 61.7 kg (136 lb 0.4 oz)    Exam:   General:  NAD. Cachetic  Cardiovascular: regular rate and rhythm   Respiratory: clear to auscultation bilaterally anterior lung fields.  Abdomen: soft, nontender, nondistended, positive bowel sounds. Urostomy bag intact. Colostomy bag with liquid stool.  Musculoskeletal:  no clubbing cyanosis or edema.    Data Reviewed: Basic Metabolic Panel:  Recent Labs Lab 08/25/2015 1438 08/15/2015 1906 08/21/15 0500 08/22/15 0502  NA 134*  --  139 135  K 4.1  --  3.6 3.3*  CL  --   --  105 100*  CO2 25  --  23 22  GLUCOSE 127  --  105* 180*  BUN 17.8  --  16 12  CREATININE 1.3  --  1.15 1.19  CALCIUM 9.5  --  8.9 8.7*  MG  --  1.5* 1.4* 2.0  PHOS  --  2.6  --   --    Liver Function Tests:  Recent Labs Lab 09/03/2015 1438  AST 9  ALT <9  ALKPHOS 171*  BILITOT 0.88  PROT 6.4  ALBUMIN 2.9*   No results for input(s): LIPASE, AMYLASE in the last 168 hours. No results for input(s): AMMONIA in the last 168 hours. CBC:  Recent Labs Lab 09/02/2015  1438 08/21/15 0500 08/22/15 0502  WBC 0.5* 0.6* 2.2*  NEUTROABS 0.2*  --  1.7  HGB 9.6* 8.5* 9.6*  HCT 29.1* 26.4* 29.7*  MCV 79.9 82.0 82.3  PLT 277 251 378   Cardiac Enzymes: No results for input(s): CKTOTAL, CKMB, CKMBINDEX, TROPONINI in the last 168 hours. BNP (last 3 results) No results for input(s): BNP in the last 8760  hours.  ProBNP (last 3 results) No results for input(s): PROBNP in the last 8760 hours.  CBG:  Recent Labs Lab 08/21/15 0753 08/21/15 2248 08/22/15 0723  GLUCAP 104* 132* 157*    Recent Results (from the past 240 hour(s))  Culture, Urine     Status: Abnormal   Collection Time: 09/04/2015  6:00 PM  Result Value Ref Range Status   Specimen Description URINE, RANDOM  Final   Special Requests NONE  Final   Culture >=100,000 COLONIES/mL YEAST (A)  Final   Report Status 08/21/2015 FINAL  Final  C difficile quick scan w PCR reflex     Status: None   Collection Time: 09/06/2015  6:00 PM  Result Value Ref Range Status   C Diff antigen NEGATIVE NEGATIVE Final   C Diff toxin NEGATIVE NEGATIVE Final   C Diff interpretation Negative for toxigenic C. difficile  Final  Gastrointestinal Panel by PCR , Stool     Status: None   Collection Time: 09/06/2015  6:00 PM  Result Value Ref Range Status   Campylobacter species NOT DETECTED NOT DETECTED Final   Plesimonas shigelloides NOT DETECTED NOT DETECTED Final   Salmonella species NOT DETECTED NOT DETECTED Final   Yersinia enterocolitica NOT DETECTED NOT DETECTED Final   Vibrio species NOT DETECTED NOT DETECTED Final   Vibrio cholerae NOT DETECTED NOT DETECTED Final   Enteroaggregative E coli (EAEC) NOT DETECTED NOT DETECTED Final   Enteropathogenic E coli (EPEC) NOT DETECTED NOT DETECTED Final   Enterotoxigenic E coli (ETEC) NOT DETECTED NOT DETECTED Final   Shiga like toxin producing E coli (STEC) NOT DETECTED NOT DETECTED Final   E. coli O157 NOT DETECTED NOT DETECTED Final   Shigella/Enteroinvasive E coli (EIEC) NOT DETECTED NOT DETECTED Final   Cryptosporidium NOT DETECTED NOT DETECTED Final   Cyclospora cayetanensis NOT DETECTED NOT DETECTED Final   Entamoeba histolytica NOT DETECTED NOT DETECTED Final   Giardia lamblia NOT DETECTED NOT DETECTED Final   Adenovirus F40/41 NOT DETECTED NOT DETECTED Final   Astrovirus NOT DETECTED NOT  DETECTED Final   Norovirus GI/GII NOT DETECTED NOT DETECTED Final   Rotavirus A NOT DETECTED NOT DETECTED Final   Sapovirus (I, II, IV, and V) NOT DETECTED NOT DETECTED Final  Culture, blood (Routine X 2) w Reflex to ID Panel     Status: None (Preliminary result)   Collection Time: 08/25/2015  7:06 PM  Result Value Ref Range Status   Specimen Description BLOOD RIGHT ARM  Final   Special Requests BOTTLES DRAWN AEROBIC AND ANAEROBIC 10CC EA  Final   Culture   Final    NO GROWTH 2 DAYS Performed at [facility]    Report Status PENDING  Incomplete  Culture, blood (Routine X 2) w Reflex to ID Panel     Status: None (Preliminary result)   Collection Time: 08/15/2015  7:06 PM  Result Value Ref Range Status   Specimen Description BLOOD LEFT ARM  Final   Special Requests BOTTLES DRAWN AEROBIC AND ANAEROBIC [place] EA  Final   Culture   Final  NO GROWTH 2 DAYS Performed at [facility]    Report Status PENDING  Incomplete     Studies: X-ray Chest Pa And Lateral  08/19/2015  CLINICAL DATA:  Shortness of breath.  Rectal cancer EXAM: CHEST  2 VIEW COMPARISON:  06/02/2015 FINDINGS: There is a a right chest wall port a catheter with tip in the projection of the SVC. The heart size appears normal. No pleural effusion or edema. Bilateral nodular densities are again noted compatible with lung metastases. IMPRESSION: 1. No acute cardiopulmonary abnormalities. 2. Pulmonary metastasis. Electronically Signed   By: Kerby Moors M.D.   On: 08/19/2015 19:58    Scheduled Meds: . ALPRAZolam  1 mg Oral BID  . collagenase   Topical Daily  . fluconazole (DIFLUCAN) IV  200 mg Intravenous Q24H  . lactose free nutrition  237 mL Oral TID WC & HS  . loperamide  4 mg Oral QID  . megestrol  200 mg Oral BID  . morphine  15 mg Oral Q12H  . multivitamin with minerals  1 tablet Oral Daily  . saccharomyces boulardii  250 mg Oral BID   Continuous Infusions: . 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 [facility] Pager [phone]. If 7PM-7AM, please contact night-coverage at [email or web address], password [facility] 08/22/2015, 12:16 PM  LOS: 2 days

## 2015-08-22 NOTE — Progress Notes (Signed)
IP PROGRESS NOTE  Subjective:   He complains of pain in the abdomen/sacrum and nausea. He believes the diarrhea is improved.  Objective: Vital signs in last 24 hours: Blood pressure 133/86, pulse 102, temperature 97.4 F (36.3 C), temperature source Oral, resp. rate 18, height 6\' 6"  (1.981 m), weight 136 lb 0.4 oz (61.7 kg), SpO2 98 %.  Intake/Output from previous day: 04/07 0701 - 04/08 0700 In: 5006.6 [P.O.:840; I.V.:4000.4; IV Piggyback:166.2] Out: 1650 [Urine:800; Stool:850]  Physical Exam:  HEENT: No thrush Lungs: Clear anteriorly, distant breath sounds Cardiac: Regular rate and rhythm Abdomen: Right lower quadrant urostomy, left lower quadrant colostomy with liquid stool Extremities: No leg edema   Portacath/PICC-without erythema  Lab Results:  Recent Labs  08/21/15 0500 08/22/15 0502  WBC 0.6* 2.2*  HGB 8.5* 9.6*  HCT 26.4* 29.7*  PLT 251 378   BMET  Recent Labs  08/21/15 0500 08/22/15 0502  NA 139 135  K 3.6 3.3*  CL 105 100*  CO2 23 22  GLUCOSE 105* 180*  BUN 16 12  CREATININE 1.15 1.19  CALCIUM 8.9 8.7*    Studies/Results: X-ray Chest Pa And Lateral  09/07/2015  CLINICAL DATA:  Shortness of breath.  Rectal cancer EXAM: CHEST  2 VIEW COMPARISON:  06/02/2015 FINDINGS: There is a a right chest wall port a catheter with tip in the projection of the SVC. The heart size appears normal. No pleural effusion or edema. Bilateral nodular densities are again noted compatible with lung metastases. IMPRESSION: 1. No acute cardiopulmonary abnormalities. 2. Pulmonary metastasis. Electronically Signed   By: Kerby Moors M.D.   On: 09/04/2015 19:58    Medications: I have reviewed the patient's current medications.  Assessment/Plan:  1. Metastatic rectal cancer, status post cycle 5 FOLFIRI/panitumumab 08/11/2015 2. History of right hydronephrosis, right renal abscess, status post placement of a right ureter stent 3. Pain at the sacrum and legs following the  APR/sacrectomy, positive FDG at the tip at the sacrum on a PET 03/05/2015 4. Diarrhea secondary to chemotherapy-continue Imodium and Lomotil 5. Severe neutropenia secondary to chemotherapy-the white count is recovering 6. Sacral decubitus ulcer  Mr. Tibbits appears unchanged. The white count is recovering. Hopefully the diarrhea will improve today.  Recommendations  1. Continue IV fluids and Imodium 2. Check WBC differential 3. Plan for restaging CT scans tomorrow if the diarrhea has improved 4. Continue MS Contin/oxycodone, and when necessary IV morphine for pain   LOS: 2 days   Betsy Coder, MD   08/22/2015, 8:39 AM

## 2015-08-23 LAB — CBC
HCT: 29.3 % — ABNORMAL LOW (ref 39.0–52.0)
Hemoglobin: 9.1 g/dL — ABNORMAL LOW (ref 13.0–17.0)
MCH: 26.1 pg (ref 26.0–34.0)
MCHC: 31.1 g/dL (ref 30.0–36.0)
MCV: 84 fL (ref 78.0–100.0)
PLATELETS: 473 10*3/uL — AB (ref 150–400)
RBC: 3.49 MIL/uL — AB (ref 4.22–5.81)
RDW: 17.9 % — ABNORMAL HIGH (ref 11.5–15.5)
WBC: 4.8 10*3/uL (ref 4.0–10.5)

## 2015-08-23 LAB — BASIC METABOLIC PANEL
Anion gap: 9 (ref 5–15)
BUN: 19 mg/dL (ref 6–20)
CO2: 23 mmol/L (ref 22–32)
CREATININE: 1.37 mg/dL — AB (ref 0.61–1.24)
Calcium: 9 mg/dL (ref 8.9–10.3)
Chloride: 109 mmol/L (ref 101–111)
GFR calc Af Amer: >60 mL/min (ref >60)
GFR, EST NON AFRICAN AMERICAN: 55 mL/min — AB (ref >60)
Glucose, Bld: 137 mg/dL — ABNORMAL HIGH (ref 65–99)
Potassium: 3.6 mmol/L (ref 3.5–5.1)
SODIUM: 141 mmol/L (ref 135–145)

## 2015-08-23 LAB — GLUCOSE, CAPILLARY: GLUCOSE-CAPILLARY: 131 mg/dL — AB (ref 65–99)

## 2015-08-23 MED ORDER — OCTREOTIDE ACETATE 50 MCG/ML IJ SOLN
100.0000 ug | Freq: Three times a day (TID) | INTRAMUSCULAR | Status: AC
  Administered 2015-08-23 (×2): 100 ug via SUBCUTANEOUS
  Filled 2015-08-23 (×4): qty 2

## 2015-08-23 MED ORDER — OCTREOTIDE ACETATE 100 MCG/ML IJ SOLN
100.0000 ug | Freq: Three times a day (TID) | INTRAMUSCULAR | Status: DC
  Administered 2015-08-23 – 2015-08-24 (×3): 100 ug via SUBCUTANEOUS
  Filled 2015-08-23 (×5): qty 1

## 2015-08-23 NOTE — Progress Notes (Signed)
[facility] PROGRESS NOTE  Grant [name] [id number] DOB: 02-Sep-1955 DOA: 09/08/2015 PCP: MATTHEWS,MICHELLE A., MD  Assessment/Plan: #1 failure to thrive Likely secondary to metastatic renal cancer status post cycle 5 FOLFIRI/panitumumab 08/11/2015. Patient has been pancultured. Urine cultures with greater than 100,000 yeast. Discontinued IV Rocephin. Continue Diflucan, IV fluids, antiemetics, supportive care.  #2 diarrhea Likely chemotherapy induced. C. difficile PCR is negative. GI pathogen panel is negative. Imodium dose has been changed to 4 mg every 6 hours. Lomotil as needed. Patient states diarrhea with no significant improvement. Patient has been started on octreotide per oncology. Oncology following.  #3 hypomagnesemia/hypokalemia Repleted.   #4 dehydration IV fluids.  #5 neutropenia Secondary to recent chemotherapy. Patient status post Neulasta. Patient has been pancultured. WBC trending back up. Urine cultures with greater than 100,000 E. Discontinued IV Rocephin. Continue Diflucan.   #6 severe protein calorie malnutrition Nutritional supplementation.  #7 ulcer of the sacral region Wound care consult pending. Continue current pain regimen.  #8 Candiduria Urine cultures with greater than 100,000 yeast. DC'd IV Rocephin. Continue Diflucan secondary to immunocompromised state.  #9 metastatic renal cell cancer Status post cycle 5 FOLFIRI/panitumumab 08/11/2015. Continue current pain regimen. Per oncology.  #10 history of right hydronephrosis/right renal abscess/status post placement of right ureteral stent  Stable. Good urine output. Follow.  #11 prophylaxis SCDs for DVT prophylaxis.  Code Status: Full Family Communication: updated patient and girlfriend at bedside. Disposition Plan: Remain inpatient.   Consultants:  Oncology: Dr. Benay Spice 08/19/2015  Procedures:  Chest x-ray 09/08/2015  Antibiotics: IV Rocephin 08/20/2012>>>>08/22/2015 Diflucan  08/22/2015  HPI/Subjective: Patient states diarrhea not improving significantly. Patient sitting up in bed.Patient with poor oral intake.  Objective: Filed Vitals:   08/22/15 2122 08/23/15 0522  BP: 122/105 136/87  Pulse: 119 103  Temp: 97.9 F (36.6 C) 97.9 F (36.6 C)  Resp: 18 20    Intake/Output Summary (Last 24 hours) at 08/23/15 1053 Last data filed at 08/23/15 0538  Gross per 24 hour  Intake 4709.88 ml  Output   2875 ml  Net 1834.88 ml   Filed Weights   08/21/15 0715 08/22/15 0420 08/23/15 0522  Weight: 59.5 kg (131 lb 2.8 oz) 61.7 kg (136 lb 0.4 oz) 58.832 kg (129 lb 11.2 oz)    Exam:   General:  NAD. Cachetic  Cardiovascular: regular rate and rhythm   Respiratory: clear to auscultation bilaterally anterior lung fields.  Abdomen: soft, nontender, nondistended, positive bowel sounds. Urostomy bag intact. Colostomy bag with liquid stool.  Musculoskeletal:  no clubbing cyanosis or edema.    Data Reviewed: Basic Metabolic Panel:  Recent Labs Lab 08/28/2015 1438 09/13/2015 1906 08/21/15 0500 08/22/15 0502 08/23/15 0500  NA 134*  --  139 135 141  K 4.1  --  3.6 3.3* 3.6  CL  --   --  105 100* 109  CO2 25  --  23 22 23   GLUCOSE 127  --  105* 180* 137*  BUN 17.8  --  16 12 19   CREATININE 1.3  --  1.15 1.19 1.37*  CALCIUM 9.5  --  8.9 8.7* 9.0  MG  --  1.5* 1.4* 2.0  --   PHOS  --  2.6  --   --   --    Liver Function Tests:  Recent Labs Lab 08/23/2015 1438  AST 9  ALT <9  ALKPHOS 171*  BILITOT 0.88  PROT 6.4  ALBUMIN 2.9*   No results for input(s): LIPASE, AMYLASE in the last 168 hours.  No results for input(s): AMMONIA in the last 168 hours. CBC:  Recent Labs Lab 08/22/2015 1438 08/21/15 0500 08/22/15 0502 08/23/15 0500  WBC 0.5* 0.6* 2.2* 4.8  NEUTROABS 0.2*  --  1.7  --   HGB 9.6* 8.5* 9.6* 9.1*  HCT 29.1* 26.4* 29.7* 29.3*  MCV 79.9 82.0 82.3 84.0  PLT 277 251 378 473*   Cardiac Enzymes: No results for input(s): CKTOTAL, CKMB,  CKMBINDEX, TROPONINI in the last 168 hours. BNP (last 3 results) No results for input(s): BNP in the last 8760 hours.  ProBNP (last 3 results) No results for input(s): PROBNP in the last 8760 hours.  CBG:  Recent Labs Lab 08/21/15 0753 08/21/15 2248 08/22/15 0723 08/23/15 0743  GLUCAP 104* 132* 157* 131*    Recent Results (from the past 240 hour(s))  Culture, Urine     Status: Abnormal   Collection Time: 08/24/2015  6:00 PM  Result Value Ref Range Status   Specimen Description URINE, RANDOM  Final   Special Requests NONE  Final   Culture >=100,000 COLONIES/mL YEAST (A)  Final   Report Status 08/21/2015 FINAL  Final  C difficile quick scan w PCR reflex     Status: None   Collection Time: 09/10/2015  6:00 PM  Result Value Ref Range Status   C Diff antigen NEGATIVE NEGATIVE Final   C Diff toxin NEGATIVE NEGATIVE Final   C Diff interpretation Negative for toxigenic C. difficile  Final  Gastrointestinal Panel by PCR , Stool     Status: None   Collection Time: 08/30/2015  6:00 PM  Result Value Ref Range Status   Campylobacter species NOT DETECTED NOT DETECTED Final   Plesimonas shigelloides NOT DETECTED NOT DETECTED Final   Salmonella species NOT DETECTED NOT DETECTED Final   Yersinia enterocolitica NOT DETECTED NOT DETECTED Final   Vibrio species NOT DETECTED NOT DETECTED Final   Vibrio cholerae NOT DETECTED NOT DETECTED Final   Enteroaggregative E coli (EAEC) NOT DETECTED NOT DETECTED Final   Enteropathogenic E coli (EPEC) NOT DETECTED NOT DETECTED Final   Enterotoxigenic E coli (ETEC) NOT DETECTED NOT DETECTED Final   Shiga like toxin producing E coli (STEC) NOT DETECTED NOT DETECTED Final   E. coli O157 NOT DETECTED NOT DETECTED Final   Shigella/Enteroinvasive E coli (EIEC) NOT DETECTED NOT DETECTED Final   Cryptosporidium NOT DETECTED NOT DETECTED Final   Cyclospora cayetanensis NOT DETECTED NOT DETECTED Final   Entamoeba histolytica NOT DETECTED NOT DETECTED Final    Giardia lamblia NOT DETECTED NOT DETECTED Final   Adenovirus F40/41 NOT DETECTED NOT DETECTED Final   Astrovirus NOT DETECTED NOT DETECTED Final   Norovirus GI/GII NOT DETECTED NOT DETECTED Final   Rotavirus A NOT DETECTED NOT DETECTED Final   Sapovirus (I, II, IV, and V) NOT DETECTED NOT DETECTED Final  Culture, blood (Routine X 2) w Reflex to ID Panel     Status: None (Preliminary result)   Collection Time: 08/21/2015  7:06 PM  Result Value Ref Range Status   Specimen Description BLOOD RIGHT ARM  Final   Special Requests BOTTLES DRAWN AEROBIC AND ANAEROBIC 10CC EA  Final   Culture   Final    NO GROWTH 2 DAYS Performed at [facility]    Report Status PENDING  Incomplete  Culture, blood (Routine X 2) w Reflex to ID Panel     Status: None (Preliminary result)   Collection Time: 09/09/2015  7:06 PM  Result Value Ref Range Status  Specimen Description BLOOD LEFT ARM  Final   Special Requests BOTTLES DRAWN AEROBIC AND ANAEROBIC [place] EA  Final   Culture   Final    NO GROWTH 2 DAYS Performed at [facility]    Report Status PENDING  Incomplete     Studies: No results found.  Scheduled Meds: . ALPRAZolam  1 mg Oral BID  . collagenase   Topical Daily  . fluconazole (DIFLUCAN) IV  200 mg Intravenous Q24H  . lactose free nutrition  237 mL Oral TID WC & HS  . loperamide  4 mg Oral QID  . megestrol  200 mg Oral BID  . morphine  15 mg Oral Q12H  . multivitamin with minerals  1 tablet Oral Daily  . octreotide  100 mcg Subcutaneous Q8H  . saccharomyces boulardii  250 mg Oral BID   Continuous Infusions: . 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 [facility] Pager [phone]. If 7PM-7AM, please contact night-coverage at [email or web address], password [facility] 08/23/2015, 10:53 AM  LOS: 3 days

## 2015-08-23 NOTE — Progress Notes (Signed)
IP PROGRESS NOTE  Subjective:   He reports feeling better in general. He continues to have diarrhea.  Objective: Vital signs in last 24 hours: Blood pressure 136/87, pulse 103, temperature 97.9 F (36.6 C), temperature source Oral, resp. rate 20, height 6\' 6"  (1.981 m), weight 129 lb 11.2 oz (58.832 kg), SpO2 98 %.  Intake/Output from previous day: 04/08 0701 - 04/09 0700 In: 5049.9 [P.O.:835; I.V.:4014.9; IV Piggyback:200] Out: 2975 [Urine:575; Stool:2400]  Physical Exam:  HEENT: No thrush  Abdomen: Right lower quadrant urostomy, left lower quadrant colostomy with liquid stool, the abdomen is soft and nontender    Portacath/PICC-without erythema  Lab Results:  Recent Labs  08/22/15 0502 08/23/15 0500  WBC 2.2* 4.8  HGB 9.6* 9.1*  HCT 29.7* 29.3*  PLT 378 473*  anc on 4/8  1.7 BMET  Recent Labs  08/22/15 0502 08/23/15 0500  NA 135 141  K 3.3* 3.6  CL 100* 109  CO2 22 23  GLUCOSE 180* 137*  BUN 12 19  CREATININE 1.19 1.37*  CALCIUM 8.7* 9.0    Studies/Results: No results found.  Medications: I have reviewed the patient's current medications.  Assessment/Plan:  1. Metastatic rectal cancer, status post cycle 5 FOLFIRI/panitumumab 08/11/2015 2. History of right hydronephrosis, right renal abscess, status post placement of a right ureter stent 3. Pain at the sacrum and legs following the APR/sacrectomy, positive FDG at the tip at the sacrum on a PET 03/05/2015 4. Diarrhea secondary to chemotherapy-continue Imodium and Lomotil, Add octreotide 5. Severe neutropenia secondary to chemotherapy-the white count has recovered 6. Sacral decubitus ulcer  Mr. Whitehall appears stable. He continues to have diarrhea. I will add octreotide today. We will hold on the restaging CT scan secondary to the persistent diarrhea and elevated creatinine.  Recommendations  1. Continue IV fluids  2. Begin octreotide 3. Continue MS Contin/oxycodone, and when necessary IV  morphine for pain   LOS: 3 days   Betsy Coder, MD   08/23/2015, 7:52 AM

## 2015-08-23 NOTE — Progress Notes (Signed)
OT Cancellation Note  Patient Details Name: Grant [name] MRN: [id number] DOB: Sep 22, 1955   Cancelled Treatment:    Reason Eval/Treat Not Completed: Fatigue/lethargy limiting ability to participate. Will check back for OT at a later time.  Deforest Maiden A 08/23/2015, 10:29 AM

## 2015-08-24 ENCOUNTER — Inpatient Hospital Stay (HOSPITAL_COMMUNITY): Payer: Medicaid Other

## 2015-08-24 DIAGNOSIS — R0602 Shortness of breath: Secondary | ICD-10-CM | POA: Insufficient documentation

## 2015-08-24 LAB — BASIC METABOLIC PANEL
Anion gap: 6 (ref 5–15)
BUN: 14 mg/dL (ref 6–20)
CO2: 22 mmol/L (ref 22–32)
CREATININE: 1.14 mg/dL (ref 0.61–1.24)
Calcium: 8.7 mg/dL — ABNORMAL LOW (ref 8.9–10.3)
Chloride: 110 mmol/L (ref 101–111)
GFR calc Af Amer: >60 mL/min (ref >60)
GLUCOSE: 115 mg/dL — AB (ref 65–99)
POTASSIUM: 3.2 mmol/L — AB (ref 3.5–5.1)
Sodium: 138 mmol/L (ref 135–145)

## 2015-08-24 LAB — CBC
HCT: 27 % — ABNORMAL LOW (ref 39.0–52.0)
Hemoglobin: 8.4 g/dL — ABNORMAL LOW (ref 13.0–17.0)
MCH: 26.1 pg (ref 26.0–34.0)
MCHC: 31.1 g/dL (ref 30.0–36.0)
MCV: 83.9 fL (ref 78.0–100.0)
PLATELETS: 355 10*3/uL (ref 150–400)
RBC: 3.22 MIL/uL — AB (ref 4.22–5.81)
RDW: 17.8 % — AB (ref 11.5–15.5)
WBC: 7.3 10*3/uL (ref 4.0–10.5)

## 2015-08-24 LAB — GLUCOSE, CAPILLARY
Glucose-Capillary: 99 mg/dL (ref 65–99)
Glucose-Capillary: 98 mg/dL (ref 65–99)

## 2015-08-24 MED ORDER — OCTREOTIDE ACETATE 100 MCG/ML IJ SOLN
200.0000 ug | Freq: Three times a day (TID) | INTRAMUSCULAR | Status: DC
  Administered 2015-08-24 – 2015-08-26 (×6): 200 ug via SUBCUTANEOUS
  Filled 2015-08-24 (×10): qty 2

## 2015-08-24 MED ORDER — POTASSIUM CHLORIDE CRYS ER 20 MEQ PO TBCR
40.0000 meq | EXTENDED_RELEASE_TABLET | ORAL | Status: AC
  Administered 2015-08-24 (×2): 40 meq via ORAL
  Filled 2015-08-24 (×2): qty 2

## 2015-08-24 NOTE — Evaluation (Signed)
Occupational Therapy Evaluation Patient Details Name: Grant [name] MRN: [id number] DOB: 1955/11/07 Today's Date: 08/24/2015    History of Present Illness 60 yo male With history of metastatic rectal cancer , microcytic anemia, status post colostomy and urostomy  who presents to oncologist office 08/24/2015 with ongoing diarrhea which developed on the day of chemotherapy. Patient noted to be significantly weak with decreased oral intake, some chills, nausea, emesis on the morning of admission.. Patient also some complaints of shortness of breath.    Clinical Impression   Pt admitted with above. He demonstrates the below listed deficits and will benefit from continued OT to maximize safety and independence with BADLs.  Pt presents to OT with significant deconditioning and generalized weakness as well as cognitive deficits.  Eval was limited due to pt nausea and fatigue, but he was able to begin strengthening exercises bil. UEs.  Currently, he requires max - total A for ADLs.  He will requires 24 hour assist at discharge; unsure if he has this level of assistance available.   If he does not, recommend SNF.       Follow Up Recommendations  SNF;Supervision/Assistance - 24 hour    Equipment Recommendations  3 in 1 bedside comode;Tub/shower bench    Recommendations for Other Services       Precautions / Restrictions Precautions Precautions: Fall Precaution Comments: urostomy and colostomy, severly deconditioned      Mobility Bed Mobility                  Transfers                 General transfer comment: unable to attempt     Balance                                            ADL Overall ADL's : Needs assistance/impaired Eating/Feeding: Minimal assistance;Bed level   Grooming: Wash/dry hands;Wash/dry face;Oral care;Brushing hair;Minimal assistance;Moderate assistance;Bed level Grooming Details (indicate cue type and reason): fatigues rapidly   Upper Body Bathing: Maximal assistance;Bed level   Lower Body Bathing: Total assistance;Bed level   Upper Body Dressing : Maximal assistance;Bed level   Lower Body Dressing: Total assistance;Bed level       Toileting- Clothing Manipulation and Hygiene: Total assistance;Bed level         General ADL Comments: Pt too fatigued to attempt OOB      Vision     Perception     Praxis      Pertinent Vitals/Pain Pain Assessment: 0-10 Pain Score: 8  Pain Location: rectal area  Pain Descriptors / Indicators: Constant Pain Intervention(s): RN gave pain meds during session     Hand Dominance Right   Extremity/Trunk Assessment Upper Extremity Assessment Upper Extremity Assessment: Generalized weakness   Lower Extremity Assessment Lower Extremity Assessment: Defer to PT evaluation   Cervical / Trunk Assessment Cervical / Trunk Assessment: Kyphotic   Communication Communication Communication: Other (comment) (Pt with low volume and mumbles - difficult to understand )   Cognition Arousal/Alertness: Awake/alert Behavior During Therapy: Flat affect Overall Cognitive Status: Impaired/Different from baseline Area of Impairment: Attention;Following commands;Problem solving   Current Attention Level: Sustained   Following Commands: Follows one step commands consistently     Problem Solving: Slow processing;Difficulty sequencing;Requires verbal cues;Requires tactile cues General Comments: Confusion noted.  Pt thought he had tobacco in his mouth (he did  not),  Difficult to follow his conversation at times     General Comments       Exercises Exercises: Other exercises;General Upper Extremity     Shoulder Instructions      Home Living Family/patient expects to be discharged to:: Private residence Living Arrangements: Spouse/significant other Available Help at Discharge: Family Type of Home: House Home Access: Stairs to enter [employer] of Steps:  5 Entrance Stairs-Rails: Right Home Layout: One level               Home Equipment: [place] - 2 wheels;Wheelchair - manual;Hospital bed          Prior Functioning/Environment Level of Independence: Independent             OT Diagnosis: Generalized weakness;Cognitive deficits;Acute pain   OT Problem List: Decreased strength;Decreased activity tolerance;Decreased cognition;Decreased safety awareness;Decreased knowledge of use of DME or AE;Pain   OT Treatment/Interventions: Self-care/ADL training;Therapeutic exercise;Energy conservation;DME and/or AE instruction;Therapeutic activities;Cognitive remediation/compensation;Patient/family education;Balance training    OT Goals(Current goals can be found in the care plan section) Acute Rehab OT Goals Patient Stated Goal: unable to state  OT Goal Formulation: With patient Time For Goal Achievement: 09/07/15 Potential to Achieve Goals: Fair ADL Goals Pt Will Perform Grooming: with min assist;sitting Pt Will Perform Upper Body Bathing: with min assist;sitting Pt Will Perform Lower Body Bathing: with mod assist;sit to/from stand Pt Will Transfer to Toilet: with min assist;stand pivot transfer;bedside commode Pt/caregiver will Perform Home Exercise Program: Increased strength;Right Upper extremity;Left upper extremity;With theraband;With minimal assist;With written HEP provided  OT Frequency: Min 2X/week   Barriers to D/C: Other (comment) (unsure if family can provide necessary level of care )          Co-evaluation              End of Session Nurse Communication: Mobility status  Activity Tolerance: Patient limited by fatigue Patient left: in bed;with call bell/phone within reach;with family/visitor present   Time: [id number] OT Time Calculation (min): 19 min Charges:  OT General Charges $OT Visit: 1 Procedure OT Evaluation $OT Eval Moderate Complexity: 1 Procedure G-Codes:    Kenndra Morris M 2015/09/14, 6:14  PM

## 2015-08-24 NOTE — Progress Notes (Signed)
[facility] PROGRESS NOTE  Grant [name] [id number] DOB: Sep 16, 1955 DOA: 09/02/2015 PCP: No primary care provider on file.  Assessment/Plan: #1 failure to thrive Likely secondary to metastatic renal cancer status post cycle 5 FOLFIRI/panitumumab 08/11/2015. Patient has been pancultured. Urine cultures with greater than 100,000 yeast. Discontinued IV Rocephin. Continue Diflucan, IV fluids, antiemetics, supportive care.  #2 diarrhea Likely chemotherapy induced. C. difficile PCR is negative. GI pathogen panel is negative. Imodium dose has been changed to 4 mg every 6 hours. Lomotil as needed. Patient states diarrhea with no significant improvement except a decrease in amount.. Patient has been started on octreotide per oncology. Oncology following.  #3 hypomagnesemia/hypokalemia Repleted.   #4 dehydration IV fluids.  #5 neutropenia Secondary to recent chemotherapy. Patient status post Neulasta. Patient has been pancultured. WBC trending back up. Urine cultures with greater than 100,000 E. Discontinued IV Rocephin. Continue Diflucan.   #6 severe protein calorie malnutrition Nutritional supplementation.  #7 ulcer of the sacral region Wound care consult pending. Continue current pain regimen.  #8 Candiduria Urine cultures with greater than 100,000 yeast. DC'd IV Rocephin. Continue Diflucan secondary to immunocompromised state.  #9 metastatic renal cell cancer Status post cycle 5 FOLFIRI/panitumumab 08/11/2015. Continue current pain regimen. Per oncology.  #10 history of right hydronephrosis/right renal abscess/status post placement of right ureteral stent  Stable. Good urine output. Follow.  #11 prophylaxis SCDs for DVT prophylaxis.  Code Status: Full Family Communication: updated patient and girlfriend at bedside. Disposition Plan: Remain inpatient. Home once diarrhea has improved significantly and patient with good oral intake and no further recommendations per  hematology oncology. Per oncology.   Consultants:  Oncology: Dr. Benay Spice 09/09/2015  Procedures:  Chest x-ray 08/21/2015  Antibiotics: IV Rocephin 08/20/2012>>>>08/22/2015 Diflucan 08/22/2015  HPI/Subjective: Patient states diarrhea slowly improving with less amount, how ever no significant change in consistency. Patient sitting up in bed.Patient with poor oral intake.  Objective: Filed Vitals:   08/23/15 2027 08/24/15 0607  BP: 116/72 135/75  Pulse: 90 90  Temp: 97.5 F (36.4 C) 98 F (36.7 C)  Resp: 16 18    Intake/Output Summary (Last 24 hours) at 08/24/15 1037 Last data filed at 08/24/15 1000  Gross per 24 hour  Intake 3767.02 ml  Output   2525 ml  Net 1242.02 ml   Filed Weights   08/21/15 0715 08/22/15 0420 08/23/15 0522  Weight: 59.5 kg (131 lb 2.8 oz) 61.7 kg (136 lb 0.4 oz) 58.832 kg (129 lb 11.2 oz)    Exam:   General:  NAD. Cachetic  Cardiovascular: regular rate and rhythm   Respiratory: clear to auscultation bilaterally anterior lung fields.  Abdomen: soft, nontender, nondistended, positive bowel sounds. Urostomy bag intact. Colostomy bag with liquid stool.  Musculoskeletal:  no clubbing cyanosis or edema.    Data Reviewed: Basic Metabolic Panel:  Recent Labs Lab 08/26/2015 1438 08/15/2015 1906 08/21/15 0500 08/22/15 0502 08/23/15 0500 08/24/15 0545  NA 134*  --  139 135 141 138  K 4.1  --  3.6 3.3* 3.6 3.2*  CL  --   --  105 100* 109 110  CO2 25  --  23 22 23 22   GLUCOSE 127  --  105* 180* 137* 115*  BUN 17.8  --  16 12 19 14   CREATININE 1.3  --  1.15 1.19 1.37* 1.14  CALCIUM 9.5  --  8.9 8.7* 9.0 8.7*  MG  --  1.5* 1.4* 2.0  --   --   PHOS  --  2.6  --   --   --   --  Liver Function Tests:  Recent Labs Lab 08/25/2015 1438  AST 9  ALT <9  ALKPHOS 171*  BILITOT 0.88  PROT 6.4  ALBUMIN 2.9*   No results for input(s): LIPASE, AMYLASE in the last 168 hours. No results for input(s): AMMONIA in the last 168  hours. CBC:  Recent Labs Lab 09/08/2015 1438 08/21/15 0500 08/22/15 0502 08/23/15 0500 08/24/15 0545  WBC 0.5* 0.6* 2.2* 4.8 7.3  NEUTROABS 0.2*  --  1.7  --   --   HGB 9.6* 8.5* 9.6* 9.1* 8.4*  HCT 29.1* 26.4* 29.7* 29.3* 27.0*  MCV 79.9 82.0 82.3 84.0 83.9  PLT 277 251 378 473* 355   Cardiac Enzymes: No results for input(s): CKTOTAL, CKMB, CKMBINDEX, TROPONINI in the last 168 hours. BNP (last 3 results) No results for input(s): BNP in the last 8760 hours.  ProBNP (last 3 results) No results for input(s): PROBNP in the last 8760 hours.  CBG:  Recent Labs Lab 08/21/15 2248 08/22/15 0723 08/23/15 0743 08/24/15 0649 08/24/15 0725  GLUCAP 132* 157* 131* 99 98    Recent Results (from the past 240 hour(s))  Culture, Urine     Status: Abnormal   Collection Time: 09/11/2015  6:00 PM  Result Value Ref Range Status   Specimen Description URINE, RANDOM  Final   Special Requests NONE  Final   Culture >=100,000 COLONIES/mL YEAST (A)  Final   Report Status 08/21/2015 FINAL  Final  C difficile quick scan w PCR reflex     Status: None   Collection Time: 08/18/2015  6:00 PM  Result Value Ref Range Status   C Diff antigen NEGATIVE NEGATIVE Final   C Diff toxin NEGATIVE NEGATIVE Final   C Diff interpretation Negative for toxigenic C. difficile  Final  Gastrointestinal Panel by PCR , Stool     Status: None   Collection Time: 08/19/2015  6:00 PM  Result Value Ref Range Status   Campylobacter species NOT DETECTED NOT DETECTED Final   Plesimonas shigelloides NOT DETECTED NOT DETECTED Final   Salmonella species NOT DETECTED NOT DETECTED Final   Yersinia enterocolitica NOT DETECTED NOT DETECTED Final   Vibrio species NOT DETECTED NOT DETECTED Final   Vibrio cholerae NOT DETECTED NOT DETECTED Final   Enteroaggregative E coli (EAEC) NOT DETECTED NOT DETECTED Final   Enteropathogenic E coli (EPEC) NOT DETECTED NOT DETECTED Final   Enterotoxigenic E coli (ETEC) NOT DETECTED NOT DETECTED  Final   Shiga like toxin producing E coli (STEC) NOT DETECTED NOT DETECTED Final   E. coli O157 NOT DETECTED NOT DETECTED Final   Shigella/Enteroinvasive E coli (EIEC) NOT DETECTED NOT DETECTED Final   Cryptosporidium NOT DETECTED NOT DETECTED Final   Cyclospora cayetanensis NOT DETECTED NOT DETECTED Final   Entamoeba histolytica NOT DETECTED NOT DETECTED Final   Giardia lamblia NOT DETECTED NOT DETECTED Final   Adenovirus F40/41 NOT DETECTED NOT DETECTED Final   Astrovirus NOT DETECTED NOT DETECTED Final   Norovirus GI/GII NOT DETECTED NOT DETECTED Final   Rotavirus A NOT DETECTED NOT DETECTED Final   Sapovirus (I, II, IV, and V) NOT DETECTED NOT DETECTED Final  Culture, blood (Routine X 2) w Reflex to ID Panel     Status: None (Preliminary result)   Collection Time: 09/03/2015  7:06 PM  Result Value Ref Range Status   Specimen Description BLOOD RIGHT ARM  Final   Special Requests BOTTLES DRAWN AEROBIC AND ANAEROBIC 10CC EA  Final   Culture   Final  NO GROWTH 3 DAYS Performed at [facility]    Report Status PENDING  Incomplete  Culture, blood (Routine X 2) w Reflex to ID Panel     Status: None (Preliminary result)   Collection Time: 08/31/2015  7:06 PM  Result Value Ref Range Status   Specimen Description BLOOD LEFT ARM  Final   Special Requests BOTTLES DRAWN AEROBIC AND ANAEROBIC [place] EA  Final   Culture   Final    NO GROWTH 3 DAYS Performed at [facility]    Report Status PENDING  Incomplete     Studies: No results found.  Scheduled Meds: . ALPRAZolam  1 mg Oral BID  . collagenase   Topical Daily  . fluconazole (DIFLUCAN) IV  200 mg Intravenous Q24H  . lactose free nutrition  237 mL Oral TID WC & HS  . loperamide  4 mg Oral QID  . megestrol  200 mg Oral BID  . morphine  15 mg Oral Q12H  . multivitamin with minerals  1 tablet Oral Daily  . octreotide  100 mcg Subcutaneous 3 times per day  . potassium chloride  40 mEq Oral Q4H  . saccharomyces boulardii   250 mg Oral BID   Continuous Infusions: . 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 [facility] Pager [phone]. If 7PM-7AM, please contact night-coverage at [email or web address], password [facility] 08/24/2015, 10:37 AM  LOS: 4 days

## 2015-08-24 NOTE — Evaluation (Addendum)
Physical Therapy Evaluation Patient Details Name: Grant [name] MRN: [id number] DOB: [birth year] Today's Date: 08/24/2015   History of Present Illness  60 yo male With history of metastatic rectal cancer , microcytic anemia, status post colostomy and urostomy  who presents to oncologist office 08/23/2015 with ongoing diarrhea which developed on the day of chemotherapy. Patient noted to be significantly weak with decreased oral intake, some chills, nausea, emesis on the morning of admission.. Patient also some complaints of shortness of breath.   Clinical Impression  The patient is severely deconditioned, very Limited evaluation due to inability to participate in mobility, reports any moving makes him nauseated. Strangled on water x 2. Unable to get an assessment of patient's ability to mobilize today. Colostomy bag opened up and leaked. RN aware. Pt admitted with above diagnosis. Pt currently with functional limitations due to the deficits listed below (see PT Problem List).  Pt will benefit from TRIAL of skilled PT to increase their functional mobility and decrease caregiver burden.  Follow Up Recommendations Home health PT (unsure if patient will tolerate any activity or  therapy if DC'd home )    Equipment Recommendations  None recommended by PT    Recommendations for Other Services       Precautions / Restrictions Precautions Precautions: Fall Precaution Comments: urostomy and colostomy, severly deconditioned      Mobility  Bed Mobility               General bed mobility comments: made several attempts to turn and sit at edge of the bed, each time patient would stop, citing he becomes more nauseated. Unable to further assess.  Transfers                 General transfer comment: unable to assess.  Ambulation/Gait                Stairs            Wheelchair Mobility    Modified Rankin (Stroke Patients Only)       Balance                                              Pertinent Vitals/Pain Pain Assessment: No/denies pain    Home Living Family/patient expects to be discharged to:: Private residence Living Arrangements: Spouse/significant other Available Help at Discharge: Family Type of Home: House Home Access: Stairs to enter Entrance Stairs-Rails: Right Entrance Stairs-Number of Steps: 5 Home Layout: One level Home Equipment: Environmental consultant - 2 wheels;Wheelchair - manual;Hospital bed Additional Comments: no family present, not certain info from patient is correct.    Prior Function           Comments: uncertain level of assistance, no family present     Hand Dominance        Extremity/Trunk Assessment   Upper Extremity Assessment: Generalized weakness           Lower Extremity Assessment: Generalized weakness         Communication   Communication:  (speech is slurred and difficult to understand, soft voice)  Cognition Arousal/Alertness: Lethargic Behavior During Therapy: Flat affect Overall Cognitive Status: Difficult to assess                      General Comments      Exercises        Assessment/Plan  PT Assessment Patient needs continued PT services  PT Diagnosis Generalized weakness   PT Problem List Decreased strength;Decreased activity tolerance;Decreased mobility  PT Treatment Interventions DME instruction;Gait training;Functional mobility training;Therapeutic activities;Therapeutic exercise;Patient/family education   PT Goals (Current goals can be found in the Care Plan section) Acute Rehab PT Goals Patient Stated Goal: patient unable PT Goal Formulation: Patient unable to participate in goal setting Time For Goal Achievement: 09/07/15 Potential to Achieve Goals: Poor    Frequency Min 3X/week   Barriers to discharge        Co-evaluation               End of Session   Activity Tolerance: Treatment limited secondary to medical complications  (Comment) (nausea, ) Patient left: in bed;with call bell/phone within reach;with bed alarm set;with nursing/sitter in room Nurse Communication: Mobility status         Time: 1131-1157 PT Time Calculation (min) (ACUTE ONLY): 26 min   Charges:   PT Evaluation $PT Eval Low Complexity: 1 Procedure     PT G CodesClaretha Cooper 08/24/2015, 1:08 PM Tresa Endo PT [phone]

## 2015-08-24 NOTE — Progress Notes (Signed)
IP PROGRESS NOTE  Subjective:   He continues to have diarrhea despite octreotide. He reports persistent pain.  Objective: Vital signs in last 24 hours: Blood pressure 133/81, pulse 99, temperature 98.1 F (36.7 C), temperature source Oral, resp. rate 16, height 6\' 6"  (1.981 m), weight 129 lb 11.2 oz (58.832 kg), SpO2 96 %.  Intake/Output from previous day: 04/09 0701 - 04/10 0700 In: 4347 [P.O.:1320; I.V.:2927; IV Piggyback:100] Out: 4075 [Urine:1025; Stool:3050]  Physical Exam:   Abdomen: Right lower quadrant urostomy, left lower quadrant colostomy with liquid stool, the abdomen is soft and nontender    Portacath/PICC-without erythema  Lab Results:  Recent Labs  08/23/15 0500 08/24/15 0545  WBC 4.8 7.3  HGB 9.1* 8.4*  HCT 29.3* 27.0*  PLT 473* 355  anc on 4/8  1.7 BMET  Recent Labs  08/23/15 0500 08/24/15 0545  NA 141 138  K 3.6 3.2*  CL 109 110  CO2 23 22  GLUCOSE 137* 115*  BUN 19 14  CREATININE 1.37* 1.14  CALCIUM 9.0 8.7*     Medications: I have reviewed the patient's current medications.  Assessment/Plan:  1. Metastatic rectal cancer, status post cycle 5 FOLFIRI/panitumumab 08/11/2015 2. History of right hydronephrosis, right renal abscess, status post placement of a right ureter stent 3. Pain at the sacrum and legs following the APR/sacrectomy, positive FDG at the tip at the sacrum on a PET 03/05/2015 4. Diarrhea secondary to chemotherapy-continue Imodium and Lomotil, Add octreotide 5. Severe neutropenia secondary to chemotherapy-the white count has recovered 6. Sacral decubitus ulcer  Mr. Bester appears Unchanged. He continues to have large volume output from the colostomy. We will increase the octreotide dose.  Recommendations  1. Continue IV fluids  2. Increase the octreotide dose 3. Continue MS Contin/oxycodone, and when necessary IV morphine for pain 4. Schedule restaging CTs on 08/25/2015   LOS: 4 days   Betsy Coder, MD    08/24/2015, 3:43 PM

## 2015-08-24 NOTE — Consult Note (Signed)
WOC wound consult note Reason for Consult: sacral ulcer Patient very thin, malnourished  Wound type: Unstageable pressure injuries x 2 Pressure Ulcer POA: Yes Measurement: 0.5cm x 0.5cm x 0.2cm and 0.2cm x 0.2cxm x 0.2cm  Wound bed: both 100% yellow Drainage (amount, consistency, odor) minimal, no odor Periwound: intact but reddened, blanchable Dressing procedure/placement/frequency: Add enzymatic debridement ointment to clean up wound beds.   Add low air loss mattress for pressure redistribution, patient high risk with very prominent bony prominences   WOC ostomy consult note Stoma type/location:  RLQ, ileal conduit; LLQ colostomy (just medial to pelvic bone Stomal assessment/size: pouches intact, patient independent with care Peristomal assessment: did not assess today Treatment options for stomal/peristomal skin: NA Output dark, yellow urine with ostomy pouch hooked to BSD bag Liquid green stool.  Ostomy pouching: 1pc convex fecal pouch; 1pc flat urostomy pouch and barrier ring Education provided: patient independent with care, reports wear time about 3 day for each Patient had some of his supplies at the bedside, however I noted he did not have barrier rings or 1pc convex fecal pouchs.  Ordered supplies to the bedside for use should he need them.  Discussed POC with patient and bedside nurse.  Re consult if needed, will not follow at this time. Thanks  Rhetta Cleek [place], [place] [phone])

## 2015-08-25 ENCOUNTER — Other Ambulatory Visit: Payer: Medicaid Other

## 2015-08-25 ENCOUNTER — Ambulatory Visit: Payer: Medicaid Other | Admitting: Oncology

## 2015-08-25 ENCOUNTER — Encounter (HOSPITAL_COMMUNITY): Payer: Self-pay | Admitting: Radiology

## 2015-08-25 ENCOUNTER — Inpatient Hospital Stay (HOSPITAL_COMMUNITY): Payer: Medicaid Other

## 2015-08-25 ENCOUNTER — Encounter: Payer: Medicaid Other | Admitting: Nutrition

## 2015-08-25 ENCOUNTER — Inpatient Hospital Stay (HOSPITAL_COMMUNITY): Payer: Medicaid Other | Admitting: Anesthesiology

## 2015-08-25 ENCOUNTER — Ambulatory Visit: Payer: Medicaid Other

## 2015-08-25 DIAGNOSIS — J690 Pneumonitis due to inhalation of food and vomit: Secondary | ICD-10-CM | POA: Clinically undetermined

## 2015-08-25 DIAGNOSIS — R4182 Altered mental status, unspecified: Secondary | ICD-10-CM

## 2015-08-25 LAB — CBC
HEMATOCRIT: 27 % — AB (ref 39.0–52.0)
HEMOGLOBIN: 8.4 g/dL — AB (ref 13.0–17.0)
MCH: 26.3 pg (ref 26.0–34.0)
MCHC: 31.1 g/dL (ref 30.0–36.0)
MCV: 84.4 fL (ref 78.0–100.0)
Platelets: 392 10*3/uL (ref 150–400)
RBC: 3.2 MIL/uL — AB (ref 4.22–5.81)
RDW: 18 % — ABNORMAL HIGH (ref 11.5–15.5)
WBC: 12 10*3/uL — AB (ref 4.0–10.5)

## 2015-08-25 LAB — BASIC METABOLIC PANEL
ANION GAP: 9 (ref 5–15)
BUN: 12 mg/dL (ref 6–20)
CALCIUM: 8.4 mg/dL — AB (ref 8.9–10.3)
CO2: 18 mmol/L — ABNORMAL LOW (ref 22–32)
Chloride: 116 mmol/L — ABNORMAL HIGH (ref 101–111)
Creatinine, Ser: 1.05 mg/dL (ref 0.61–1.24)
Glucose, Bld: 115 mg/dL — ABNORMAL HIGH (ref 65–99)
POTASSIUM: 3.5 mmol/L (ref 3.5–5.1)
Sodium: 143 mmol/L (ref 135–145)

## 2015-08-25 LAB — CULTURE, BLOOD (ROUTINE X 2)
CULTURE: NO GROWTH
CULTURE: NO GROWTH

## 2015-08-25 LAB — GLUCOSE, CAPILLARY
GLUCOSE-CAPILLARY: 96 mg/dL (ref 65–99)
GLUCOSE-CAPILLARY: 102 mg/dL — AB (ref 65–99)

## 2015-08-25 MED ORDER — IOPAMIDOL (ISOVUE-300) INJECTION 61%
75.0000 mL | Freq: Once | INTRAVENOUS | Status: AC | PRN
  Administered 2015-08-25: 75 mL via INTRAVENOUS

## 2015-08-25 MED ORDER — ENOXAPARIN SODIUM 40 MG/0.4ML ~~LOC~~ SOLN
40.0000 mg | Freq: Every day | SUBCUTANEOUS | Status: DC
  Administered 2015-08-26 – 2015-08-28 (×4): 40 mg via SUBCUTANEOUS
  Filled 2015-08-25 (×4): qty 0.4

## 2015-08-25 MED ORDER — PIPERACILLIN-TAZOBACTAM 3.375 G IVPB
3.3750 g | Freq: Three times a day (TID) | INTRAVENOUS | Status: DC
  Administered 2015-08-25 – 2015-08-28 (×8): 3.375 g via INTRAVENOUS
  Filled 2015-08-25 (×7): qty 50

## 2015-08-25 MED ORDER — VANCOMYCIN HCL IN DEXTROSE 750-5 MG/150ML-% IV SOLN
750.0000 mg | Freq: Two times a day (BID) | INTRAVENOUS | Status: DC
  Administered 2015-08-26 – 2015-08-27 (×3): 750 mg via INTRAVENOUS
  Filled 2015-08-25 (×3): qty 150

## 2015-08-25 MED ORDER — PANTOPRAZOLE SODIUM 40 MG IV SOLR
40.0000 mg | Freq: Every day | INTRAVENOUS | Status: DC
  Administered 2015-08-25: 40 mg via INTRAVENOUS
  Filled 2015-08-25: qty 40

## 2015-08-25 MED ORDER — VANCOMYCIN HCL IN DEXTROSE 1-5 GM/200ML-% IV SOLN
1000.0000 mg | Freq: Once | INTRAVENOUS | Status: AC
  Administered 2015-08-25: 1000 mg via INTRAVENOUS
  Filled 2015-08-25: qty 200

## 2015-08-25 MED ORDER — PROPOFOL 10 MG/ML IV BOLUS
INTRAVENOUS | Status: AC
  Filled 2015-08-25: qty 20

## 2015-08-25 MED ORDER — IOHEXOL 300 MG/ML  SOLN: 25.0000 mL | INTRAMUSCULAR | Status: AC

## 2015-08-25 MED ORDER — PROPOFOL 1000 MG/100ML IV EMUL
5.0000 ug/kg/min | INTRAVENOUS | Status: DC
  Filled 2015-08-25: qty 100

## 2015-08-25 NOTE — Progress Notes (Signed)
IP PROGRESS NOTE  Subjective:   He  Continues to have sacral pain. His power of attorney is at the bedside.  Objective: Vital signs in last 24 hours: Blood pressure 126/67, pulse 102, temperature 98.1 F (36.7 C), temperature source Oral, resp. rate 16, height 6\' 6"  (1.981 m), weight 137 lb 11.2 oz (62.46 kg), SpO2 95 %.  Intake/Output from previous day: 04/10 0701 - 04/11 0700 In: -  Out: 3425 [Urine:500; Stool:2925]  Physical Exam:  HEENT: No thrush   Abdomen: Right lower quadrant urostomy, left lower quadrant colostomy with liquid stool, the abdomen is soft and nontender  neurologic: Alert, confused   Portacath/PICC-without erythema  Lab Results:  Recent Labs  08/24/15 0545 08/25/15 0551  WBC 7.3 12.0*  HGB 8.4* 8.4*  HCT 27.0* 27.0*  PLT 355 392   BMET  Recent Labs  08/24/15 0545 08/25/15 0551  NA 138 143  K 3.2* 3.5  CL 110 116*  CO2 22 18*  GLUCOSE 115* 115*  BUN 14 12  CREATININE 1.14 1.05  CALCIUM 8.7* 8.4*     Medications: I have reviewed the patient's current medications.  Assessment/Plan:  1. Metastatic rectal cancer, status post cycle 5 FOLFIRI/panitumumab 08/11/2015 2. History of right hydronephrosis, right renal abscess, status post placement of a right ureter stent 3. Pain at the sacrum and legs following the APR/sacrectomy, positive FDG at the tip at the sacrum on a PET 03/05/2015 4. Diarrhea secondary to chemotherapy-continue Imodium, Lomotil, and octreotide 5. Severe neutropenia secondary to chemotherapy-the white count has recovered 6. Sacral decubitus ulcer 7.  Altered mental status-likely hospital delirium and effect from narcotics  Mr. Grieco appears Stable. He remains confused and weak. The diarrhea persists. He will undergo a restaging CT evaluation today.  I discussed the situation with his significant other/healthcare power of attorney. I explained my recommendation for Hospice care if the restaging CTs show no  improvement from the FOLFIRI/panitumumab. She is in agreement. We discussed CODE STATUS. She is also in agreement with a no CODE BLUE status, but Mr. Okino would like to remain on a full CODE STATUS for now. We will continue this discussion.    Recommendations  1. Continue IV fluids  2.Continue octreotide 3. Continue MS Contin, use oxycodone as possible for breakthrough pain 4.  Restaging CTs today   LOS: 5 days   Betsy Coder, MD   08/25/2015, 1:52 PM

## 2015-08-25 NOTE — Progress Notes (Signed)
OT Cancellation Note  Patient Details Name: Grant [name] MRN: [id number] DOB: 24-Oct-1955   Cancelled Treatment:    Reason Eval/Treat Not Completed: Fatigue/lethargy limiting ability to participate -- pt and wife both soundly sleeping in room. Will follow up for OT another time.  Akane Tessier A 08/25/2015, 2:56 PM

## 2015-08-25 NOTE — Progress Notes (Signed)
[place] Progress Note Patient Name: Grant [name] DOB: July 06, 1955 MRN: [id number]   Date of Service  08/25/2015  HPI/Events of Note  Multiple issues: 1. VDRF - Intubated and ventilated. 2. Need for stress ulcer and DVT prophylaxis. Portable CXR already ordered.   eICU Interventions  Will order: 1. Ventilator settings: 100%/PRVC 14/TV 500/P 5. 2. ABG at 12:30 AM. 3. Protonix IV. 4. Lovenox [name].     Intervention Category Major Interventions: Respiratory failure - evaluation and management  Lysle Dingwall 08/25/2015, 11:37 PM

## 2015-08-25 NOTE — Care Management Note (Signed)
Case Management Note  Patient Details  Name: Grant [name] MRN: [id number] Date of Birth: 27-Nov-1955  Subjective/Objective:  60 yo admitted with FTT. Hx of rectal Ca undergoing chemo.                  Action/Plan: From home with spouse.  Expected Discharge Date:                  Expected Discharge Plan:  [place]  In-House Referral:     Discharge planning Services  CM Consult  Post Acute Care Choice:    Choice offered to:  Patient, Spouse  DME Arranged:    DME Agency:     HH Arranged:  RN, Nurse's Aide [place] Agency:     Status of Service:  In process, will continue to follow  Medicare Important Message Given:    Date Medicare IM Given:    Medicare IM give by:    Date Additional Medicare IM Given:    Additional Medicare Important Message give by:     If discussed at [place] of Stay Meetings, dates discussed:    Additional Comments: This CM met with pt and wife to discuss PT recommendations.  HHPT was recommended.  Pt has Medicaid so unable to receive HHPT but can receive HHRN for safety eval.  Pt and wife confirm they would like a HHRN and aide.  Will need MD orders for HHRN/aide.  Pt and wife would like to look over [facility] provider list and decide on provider.  List left with pt and will check back later.  DME was discussed and no needs were identified. Lynnell Catalan, RN 08/25/2015, 11:15 AM [phone]

## 2015-08-25 NOTE — Transfer of Care (Signed)
Immediate Anesthesia Transfer of Care Note  Patient: Grant [name]  Procedure(s) Performed: * No procedures listed *  Patient Location: ICU  Anesthesia Type:Intubation only  Level of Consciousness: unresponsive  Airway & Oxygen Therapy: Patient placed on Ventilator (see vital sign flow sheet for setting)  Post-op Assessment: Report given to RN and Post -op Vital signs reviewed and stable  Post vital signs: Reviewed and stable  Last Vitals:  Filed Vitals:   08/25/15 2250 08/25/15 2328  BP: 137/70 140/74  Pulse: 113   Temp: 36.5 C   Resp:  14    Complications: No apparent anesthesia complications

## 2015-08-25 NOTE — Progress Notes (Signed)
Pharmacy Antibiotic Note  Dollie Steeg is a 60 y.o. male admitted on 08/26/2015 with failure to thrive, diarrhea.  Patient with PMH including metastatic rectal cancer, undergoing chemotherapy. Pharmacy has been consulted for Vancomycin & Zosyn dosing for suspected aspiration pneumonia.  Plan: Vancomycin 1gm IV x 1 dose then 750mg  IV every 12 hours.  Goal trough 15-20 mcg/mL. Zosyn 3.375g IV q8h (4 hour infusion). F/U cultures  Height: 6\' 6"  (198.1 cm) Weight: 137 lb 11.2 oz (62.46 kg) IBW/kg (Calculated) : 91.4  Temp (24hrs), Avg:98 F (36.7 C), Min:97.9 F (36.6 C), Max:98.1 F (36.7 C)   Recent Labs Lab 08/21/15 0500 08/22/15 0502 08/23/15 0500 08/24/15 0545 08/25/15 0551  WBC 0.6* 2.2* 4.8 7.3 12.0*  CREATININE 1.15 1.19 1.37* 1.14 1.05    Estimated Creatinine Clearance: 67.8 mL/min (by C-G formula based on Cr of 1.05).    Allergies  Allergen Reactions  . Strawberry Extract Anaphylaxis and Hives  . Sunflower Oil Anaphylaxis and Hives    seeds  . Watermelon [Citrullus Vulgaris] Anaphylaxis and Hives  . Gabapentin     Other reaction(s): Other (See Comments) Pt states a few months ago he was prescribed this medication, it caused him to have both auditory and visual hallucinations  . Other     All Melons cantaloupes, etc    Antimicrobials this admission: 4/7 Ceftriaxone >> 4/8 4/8 Fluconazole >>   4/11 Vanc >> 4/11 Zosyn >>  Dose adjustments this admission:    Microbiology results: 4/6 BCx: NG (final) 4/6 UCx: > 100,000 yeast (final)  4/11 Sputum:    4/11 BCx:  Thank you for allowing pharmacy to be a part of this patient's care.  Everette Rank, PharmD 08/25/2015 5:31 PM

## 2015-08-25 NOTE — Clinical Documentation Improvement (Signed)
Hospitalist  Can the diagnosis of pressure ulcer be further specified?   Document if pressure ulcer with stage is Present on Admission  Pressure Ulcer Stage - Stage1, Stage 2, Stage 3, Stage 4, Unstageable, Unspecified, Unable to Clinically Determine  Other  Clinically Undetermined    Supporting Information: Superficial sacral decubitus ulcers per 4/07 progress notes.   Please exercise your independent, professional judgment when responding. A specific answer is not anticipated or expected.   Thank You,  [place] [phone]

## 2015-08-25 NOTE — Anesthesia Procedure Notes (Addendum)
Procedure Name: Intubation Date/Time: 08/25/2015 11:20 PM Performed by: Anne Fu Pre-anesthesia Checklist: Patient identified, Emergency Drugs available, Suction available, Patient being monitored and Timeout performed Patient Re-evaluated:Patient Re-evaluated prior to inductionOxygen Delivery Method: Ambu bag Preoxygenation: Pre-oxygenation with 100% oxygen Ventilation: Mask ventilation without difficulty Laryngoscope Size: Mac and 4 Grade View: Grade I Tube type: Oral Tube size: 7.5 mm Number of attempts: 2 Airway Equipment and Method: Bougie stylet and Video-laryngoscopy Placement Confirmation: ETT inserted through vocal cords under direct vision,  positive ETCO2,  CO2 detector and breath sounds checked- equal and bilateral Secured at: 23 cm Tube secured with: Tape Dental Injury: Teeth and Oropharynx as per pre-operative assessment  Comments: Attempted with bougie stylet Grade III view without success converted to Video-Laryngoscopy with Grade I View noted bilat lung sounds post intubation positive ETCO2.

## 2015-08-25 NOTE — Progress Notes (Signed)
[facility] PROGRESS NOTE  Grant [name] [id number] DOB: Sep 18, 1955 DOA: 09/02/2015 PCP: No primary care provider on file.  Assessment/Plan: #1 failure to thrive Likely secondary to metastatic renal cancer status post cycle 5 FOLFIRI/panitumumab 08/11/2015. Patient has been pancultured. Urine cultures with greater than 100,000 yeast. Discontinued IV Rocephin. Continue Diflucan, IV fluids, antiemetics, supportive care. CT chest with concerns of possible aspiration pneumonia versus lymphangitic spread. Patient lethargic this evening. We'll place empirically on IV antibiotics for healthcare associated pneumonia. Consulted palliative care.  #2 diarrhea Likely chemotherapy induced. C. difficile PCR is negative. GI pathogen panel is negative. Imodium dose has been changed to 4 mg every 6 hours. Lomotil as needed. Patient states diarrhea with no significant improvement except a decrease in amount.. Patient has been started on octreotide per oncology. Oncology following.  #3 Probable aspiration pneumonia versus healthcare associated pneumonia Per CT chest. Will check a sputum Gram stain and culture. Check a urine Legionella antigen. Check a urine pneumococcus antigen. Check blood cultures 2. Placed empirically on IV vancomycin and IV Zosyn. IV fluids. Supportive care.  #4 hypomagnesemia/hypokalemia Repleted.   #5 dehydration IV fluids.  #6 neutropenia Secondary to recent chemotherapy. Patient status post Neulasta. Patient has been pancultured. WBC trending back up. Urine cultures with greater than 100,000 E. Discontinued IV Rocephin. Continue Diflucan.   #7 severe protein calorie malnutrition Patient with poor oral intake. Nutritional supplementation.  #8 ulcer of the sacral region Continue current pain regimen and wound care.  #9 Candiduria Urine cultures with greater than 100,000 yeast. DC'd IV Rocephin. Continue Diflucan secondary to immunocompromised state.  #10 metastatic  renal cell cancer Status post cycle 5 FOLFIRI/panitumumab 08/11/2015. Continue current pain regimen. Patient status post restaging CT chest and abdomen which shows direct invasion of the liver, right psoas muscle, right quadratus lumborum and other posterior right-sided flank musculature. Pulmonary nodules appear stable however widespread [place] bronchovascular normal July ready seen consent for infectious/inflammatory etiologies such as aspiration versus developing areas of lymphangitic spread of disease in the lung bases. Large volume of liquid stool throughout the colon negative for obstruction but may indicate enteritis. Enlarging area of soft tissue thickening and ostial LAD is in the coccyx may reflect metastatic disease or indicate chronic osteomyelitis. Patient with a poor prognosis. Palliative care consultation for goals of care. Per oncology.  #11 history of right hydronephrosis/right renal abscess/status post placement of right ureteral stent  Stable. Good urine output. Follow.  #12 prophylaxis SCDs for DVT prophylaxis.  Code Status: Full Family Communication: updated patient and girlfriend at bedside. Disposition Plan: Remain inpatient. Home once diarrhea has improved significantly and patient with good oral intake and no further recommendations per hematology oncology. Per oncology.   Consultants:  Oncology: Dr. Benay Spice 09/13/2015  Procedures:  Chest x-ray 08/22/2015  CT chest and abdomen 08/25/2015  Antibiotics: IV Rocephin 08/20/2012>>>>08/22/2015 Diflucan 08/22/2015  HPI/Subjective: Patient lethargic and weak. Patient states not feeling well. Patient denies any shortness of breath.  Objective: Filed Vitals:   08/25/15 1706 08/25/15 1708  BP:    Pulse:    Temp: 98.1 F (36.7 C) 97.9 F (36.6 C)  Resp:      Intake/Output Summary (Last 24 hours) at 08/25/15 1714 Last data filed at 08/25/15 1340  Gross per 24 hour  Intake      0 ml  Output   2900 ml  Net  -2900  ml   Filed Weights   08/23/15 0522 08/24/15 0607 08/25/15 0232  Weight: 58.832 kg (129 lb 11.2 oz)  64.774 kg (142 lb 12.8 oz) 62.46 kg (137 lb 11.2 oz)    Exam:   General:  NAD. Cachetic.Patient lethargic and weak.  Cardiovascular: regular rate and rhythm   Respiratory: clear to auscultation bilaterally anterior lung fields.  Abdomen: soft, nontender, nondistended, positive bowel sounds. Urostomy bag intact. Colostomy bag with liquid stool.  Musculoskeletal:  no clubbing cyanosis or edema.    Data Reviewed: Basic Metabolic Panel:  Recent Labs Lab 09/12/2015 1906 08/21/15 0500 08/22/15 0502 08/23/15 0500 08/24/15 0545 08/25/15 0551  NA  --  139 135 141 138 143  K  --  3.6 3.3* 3.6 3.2* 3.5  CL  --  105 100* 109 110 116*  CO2  --  23 22 23 22  18*  GLUCOSE  --  105* 180* 137* 115* 115*  BUN  --  16 12 19 14 12   CREATININE  --  1.15 1.19 1.37* 1.14 1.05  CALCIUM  --  8.9 8.7* 9.0 8.7* 8.4*  MG 1.5* 1.4* 2.0  --   --   --   PHOS 2.6  --   --   --   --   --    Liver Function Tests:  Recent Labs Lab 08/27/2015 1438  AST 9  ALT <9  ALKPHOS 171*  BILITOT 0.88  PROT 6.4  ALBUMIN 2.9*   No results for input(s): LIPASE, AMYLASE in the last 168 hours. No results for input(s): AMMONIA in the last 168 hours. CBC:  Recent Labs Lab 08/24/2015 1438 08/21/15 0500 08/22/15 0502 08/23/15 0500 08/24/15 0545 08/25/15 0551  WBC 0.5* 0.6* 2.2* 4.8 7.3 12.0*  NEUTROABS 0.2*  --  1.7  --   --   --   HGB 9.6* 8.5* 9.6* 9.1* 8.4* 8.4*  HCT 29.1* 26.4* 29.7* 29.3* 27.0* 27.0*  MCV 79.9 82.0 82.3 84.0 83.9 84.4  PLT 277 251 378 473* 355 392   Cardiac Enzymes: No results for input(s): CKTOTAL, CKMB, CKMBINDEX, TROPONINI in the last 168 hours. BNP (last 3 results) No results for input(s): BNP in the last 8760 hours.  ProBNP (last 3 results) No results for input(s): PROBNP in the last 8760 hours.  CBG:  Recent Labs Lab 08/23/15 0743 08/24/15 0649 08/24/15 0725  08/25/15 0602 08/25/15 0752  GLUCAP 131* 99 98 96 102*    Recent Results (from the past 240 hour(s))  Culture, Urine     Status: Abnormal   Collection Time: 09/04/2015  6:00 PM  Result Value Ref Range Status   Specimen Description URINE, RANDOM  Final   Special Requests NONE  Final   Culture >=100,000 COLONIES/mL YEAST (A)  Final   Report Status 08/21/2015 FINAL  Final  C difficile quick scan w PCR reflex     Status: None   Collection Time: 08/18/2015  6:00 PM  Result Value Ref Range Status   C Diff antigen NEGATIVE NEGATIVE Final   C Diff toxin NEGATIVE NEGATIVE Final   C Diff interpretation Negative for toxigenic C. difficile  Final  Gastrointestinal Panel by PCR , Stool     Status: None   Collection Time: 08/27/2015  6:00 PM  Result Value Ref Range Status   Campylobacter species NOT DETECTED NOT DETECTED Final   Plesimonas shigelloides NOT DETECTED NOT DETECTED Final   Salmonella species NOT DETECTED NOT DETECTED Final   Yersinia enterocolitica NOT DETECTED NOT DETECTED Final   Vibrio species NOT DETECTED NOT DETECTED Final   Vibrio cholerae NOT DETECTED NOT DETECTED Final  Enteroaggregative E coli (EAEC) NOT DETECTED NOT DETECTED Final   Enteropathogenic E coli (EPEC) NOT DETECTED NOT DETECTED Final   Enterotoxigenic E coli (ETEC) NOT DETECTED NOT DETECTED Final   Shiga like toxin producing E coli (STEC) NOT DETECTED NOT DETECTED Final   E. coli O157 NOT DETECTED NOT DETECTED Final   Shigella/Enteroinvasive E coli (EIEC) NOT DETECTED NOT DETECTED Final   Cryptosporidium NOT DETECTED NOT DETECTED Final   Cyclospora cayetanensis NOT DETECTED NOT DETECTED Final   Entamoeba histolytica NOT DETECTED NOT DETECTED Final   Giardia lamblia NOT DETECTED NOT DETECTED Final   Adenovirus F40/41 NOT DETECTED NOT DETECTED Final   Astrovirus NOT DETECTED NOT DETECTED Final   Norovirus GI/GII NOT DETECTED NOT DETECTED Final   Rotavirus A NOT DETECTED NOT DETECTED Final   Sapovirus (I, II,  IV, and V) NOT DETECTED NOT DETECTED Final  Culture, blood (Routine X 2) w Reflex to ID Panel     Status: None   Collection Time: 09/05/2015  7:06 PM  Result Value Ref Range Status   Specimen Description BLOOD RIGHT ARM  Final   Special Requests BOTTLES DRAWN AEROBIC AND ANAEROBIC 10CC EA  Final   Culture   Final    NO GROWTH 5 DAYS Performed at [facility]    Report Status 08/25/2015 FINAL  Final  Culture, blood (Routine X 2) w Reflex to ID Panel     Status: None   Collection Time: 08/25/2015  7:06 PM  Result Value Ref Range Status   Specimen Description BLOOD LEFT ARM  Final   Special Requests BOTTLES DRAWN AEROBIC AND ANAEROBIC Uncertain EA  Final   Culture   Final    NO GROWTH 5 DAYS Performed at [facility]    Report Status 08/25/2015 FINAL  Final     Studies: Ct Abdomen Pelvis W Wo Contrast  08/25/2015  CLINICAL DATA:  60 year old male with history of rectal cancer diagnosed in 2015, with renal metastasis diagnosed in 2016, as well as lung metastases and bone metastases. Ongoing chemotherapy. Increased colostomy output. Diffuse pain. EXAM: CT CHEST, ABDOMEN, AND PELVIS WITH CONTRAST TECHNIQUE: Multidetector CT imaging of the chest, abdomen and pelvis was performed following the standard protocol during bolus administration of intravenous contrast. CONTRAST:  54mL ISOVUE-300 IOPAMIDOL (ISOVUE-300) INJECTION 61% COMPARISON:  CT of the chest, abdomen and pelvis 06/02/2015. FINDINGS: CT CHEST FINDINGS Mediastinum/Lymph Nodes: Heart size is normal. There is no significant pericardial fluid, thickening or pericardial calcification. There is atherosclerosis of the thoracic aorta, the great vessels of the mediastinum and the coronary arteries, including calcified atherosclerotic plaque in the left anterior descending coronary artery. Mildly enlarged low right paratracheal lymph node measuring 1 cm in short axis. Enlarged low left paratracheal lymph node measuring 1 cm in short axis. No  hilar lymphadenopathy. Esophagus is unremarkable in appearance. No axillary lymphadenopathy. Lungs/Pleura: Evaluation of the lung parenchyma is limited by considerable patient respiratory motion on today's examination. Previously noted pulmonary nodules in the right middle lobe appear slightly smaller and less distinct than the prior study, largest of which measures 8 mm on today's examination (image 83 of series 8), suggesting some positive response to therapy. Previously noted nodule in the base of the left lower lobe currently measures 13 x 9 mm (image 74 of series 8) and is similar to the prior examination. Previously noted nodules in the right lung base are now obscured by considerable airspace consolidation dependently, and extensive peribronchovascular micronodularity which is present throughout all  aspects of the lung bases bilaterally, but is most severe in the right lower lobe. Many of the right lower lobe bronchi in the basal segments appear completely opacified by retained secretions. Similar findings are present to a lesser degree in the left lower lobe. This patchy peribronchovascular micronodularity is present throughout all aspects of the lungs bilaterally extending into the lung apices, but does have a basal predominance where there is also some associated septal thickening and very mild septal nodularity. Trace right pleural effusion lying dependently. Musculoskeletal/Soft Tissues: There are no aggressive appearing lytic or blastic lesions noted in the visualized portions of the skeleton. CT ABDOMEN AND PELVIS FINDINGS Hepatobiliary: In the inferior aspect of segment 6 of the liver there appears to be direct invasion of the hepatic parenchyma from the patient's large right renal metastasis. This is best appreciated on coronal image 59 of series 604. No other suspicious cystic or solid hepatic lesions are noted. No intra or extrahepatic biliary ductal dilatation. Diffuse periportal edema.  Gallbladder is moderately distended. However, there are no gallstones, and no pericholecystic fluid or surrounding inflammatory changes to suggest an acute cholecystitis at this time. Pancreas: No pancreatic mass. No pancreatic ductal dilatation. No pancreatic or peripancreatic fluid or inflammatory changes. Spleen: Unremarkable. Adrenals/Urinary Tract: Bilateral adrenal glands and the left kidney are normal in appearance. Again noted are multiple infiltrative masses within the right kidney related to known metastatic disease. On today's study this currently measures approximately 13.5 x 9.0 x 13.0 cm (slightly smaller than the prior examination), and is highly invasive into adjacent structures, including the under surface of segment 6 of the liver and posteriorly into the right psoas, quadratus lumborum and right flank musculature. Status post radical cystectomy with ileal conduit. Stomach/Bowel: The appearance of the stomach is normal. There multiple borderline dilated loops of small bowel demonstrating air-fluid levels. These measure up to 3.8 cm in diameter. Status post low anterior resection. Left lower quadrant colostomy. Large amount of liquid stool in the colon. Vascular/Lymphatic: Atherosclerosis throughout the abdominal and pelvic vasculature, without evidence of aneurysm or dissection. Multiple borderline enlarged retroperitoneal lymph nodes are again noted, but there are no definite pathologically enlarged lymph nodes identified in the abdomen or pelvis. Reproductive: Status post radical prostatectomy. Other: Soft tissue thickening in the presacral space and along the right pelvic sidewall, similar to prior examinations, presumably post treatment related. Trace volume of ascites. No pneumoperitoneum. Mild diffuse mesenteric edema. Musculoskeletal: Again noted is some osteolysis and associated soft tissue thickening of the coccyx, which currently measures up to 4.4 x 4.1 cm. Whether or not this is from an  osseous metastasis or is related to chronic osteomyelitis is uncertain, but this appears to have progressed slightly compared to the prior examination. IMPRESSION: 1. Today's study demonstrates some positive response to therapy with slight decreased size of the highly infiltrative right renal mass. This mass continues to show direct invasion of segment 6 of the liver, the right psoas muscle, right quadratus lumborum and other posterior right-sided flank musculature. 2. Previously noted pulmonary nodules appear generally stable to slightly decreased compared to the prior examination, however, assessment is limited on today's study given the widespread peribronchovascular nodularity seen on today's examination. Although the appearance of today's study favors infectious/inflammatory etiologies, potentially related to significant aspiration, the possibility of developing areas of lymphangitic spread of disease are not excluded, particularly in the lung bases. 3. Borderline dilated loops of small bowel with multiple air-fluid levels. In addition, there is a large volume of  liquid stool throughout the colon. Findings do not suggest obstruction, but may indicate an enteritis. 4. Enlarging area of soft tissue thickening and osteolysis in the coccyx may reflect metastatic disease, or may indicate chronic osteomyelitis. 5. New trace right pleural effusion. 6. Additional findings, as above, similar to prior examinations. Electronically Signed   By: Vinnie Langton M.D.   On: 08/25/2015 11:17   Ct Chest W Contrast  08/25/2015  CLINICAL DATA:  59 year old male with history of rectal cancer diagnosed in 2015, with renal metastasis diagnosed in 2016, as well as lung metastases and bone metastases. Ongoing chemotherapy. Increased colostomy output. Diffuse pain. EXAM: CT CHEST, ABDOMEN, AND PELVIS WITH CONTRAST TECHNIQUE: Multidetector CT imaging of the chest, abdomen and pelvis was performed following the standard protocol  during bolus administration of intravenous contrast. CONTRAST:  81mL ISOVUE-300 IOPAMIDOL (ISOVUE-300) INJECTION 61% COMPARISON:  CT of the chest, abdomen and pelvis 06/02/2015. FINDINGS: CT CHEST FINDINGS Mediastinum/Lymph Nodes: Heart size is normal. There is no significant pericardial fluid, thickening or pericardial calcification. There is atherosclerosis of the thoracic aorta, the great vessels of the mediastinum and the coronary arteries, including calcified atherosclerotic plaque in the left anterior descending coronary artery. Mildly enlarged low right paratracheal lymph node measuring 1 cm in short axis. Enlarged low left paratracheal lymph node measuring 1 cm in short axis. No hilar lymphadenopathy. Esophagus is unremarkable in appearance. No axillary lymphadenopathy. Lungs/Pleura: Evaluation of the lung parenchyma is limited by considerable patient respiratory motion on today's examination. Previously noted pulmonary nodules in the right middle lobe appear slightly smaller and less distinct than the prior study, largest of which measures 8 mm on today's examination (image 83 of series 8), suggesting some positive response to therapy. Previously noted nodule in the base of the left lower lobe currently measures 13 x 9 mm (image 74 of series 8) and is similar to the prior examination. Previously noted nodules in the right lung base are now obscured by considerable airspace consolidation dependently, and extensive peribronchovascular micronodularity which is present throughout all aspects of the lung bases bilaterally, but is most severe in the right lower lobe. Many of the right lower lobe bronchi in the basal segments appear completely opacified by retained secretions. Similar findings are present to a lesser degree in the left lower lobe. This patchy peribronchovascular micronodularity is present throughout all aspects of the lungs bilaterally extending into the lung apices, but does have a basal  predominance where there is also some associated septal thickening and very mild septal nodularity. Trace right pleural effusion lying dependently. Musculoskeletal/Soft Tissues: There are no aggressive appearing lytic or blastic lesions noted in the visualized portions of the skeleton. CT ABDOMEN AND PELVIS FINDINGS Hepatobiliary: In the inferior aspect of segment 6 of the liver there appears to be direct invasion of the hepatic parenchyma from the patient's large right renal metastasis. This is best appreciated on coronal image 59 of series 604. No other suspicious cystic or solid hepatic lesions are noted. No intra or extrahepatic biliary ductal dilatation. Diffuse periportal edema. Gallbladder is moderately distended. However, there are no gallstones, and no pericholecystic fluid or surrounding inflammatory changes to suggest an acute cholecystitis at this time. Pancreas: No pancreatic mass. No pancreatic ductal dilatation. No pancreatic or peripancreatic fluid or inflammatory changes. Spleen: Unremarkable. Adrenals/Urinary Tract: Bilateral adrenal glands and the left kidney are normal in appearance. Again noted are multiple infiltrative masses within the right kidney related to known metastatic disease. On today's study this currently measures approximately  13.5 x 9.0 x 13.0 cm (slightly smaller than the prior examination), and is highly invasive into adjacent structures, including the under surface of segment 6 of the liver and posteriorly into the right psoas, quadratus lumborum and right flank musculature. Status post radical cystectomy with ileal conduit. Stomach/Bowel: The appearance of the stomach is normal. There multiple borderline dilated loops of small bowel demonstrating air-fluid levels. These measure up to 3.8 cm in diameter. Status post low anterior resection. Left lower quadrant colostomy. Large amount of liquid stool in the colon. Vascular/Lymphatic: Atherosclerosis throughout the abdominal and  pelvic vasculature, without evidence of aneurysm or dissection. Multiple borderline enlarged retroperitoneal lymph nodes are again noted, but there are no definite pathologically enlarged lymph nodes identified in the abdomen or pelvis. Reproductive: Status post radical prostatectomy. Other: Soft tissue thickening in the presacral space and along the right pelvic sidewall, similar to prior examinations, presumably post treatment related. Trace volume of ascites. No pneumoperitoneum. Mild diffuse mesenteric edema. Musculoskeletal: Again noted is some osteolysis and associated soft tissue thickening of the coccyx, which currently measures up to 4.4 x 4.1 cm. Whether or not this is from an osseous metastasis or is related to chronic osteomyelitis is uncertain, but this appears to have progressed slightly compared to the prior examination. IMPRESSION: 1. Today's study demonstrates some positive response to therapy with slight decreased size of the highly infiltrative right renal mass. This mass continues to show direct invasion of segment 6 of the liver, the right psoas muscle, right quadratus lumborum and other posterior right-sided flank musculature. 2. Previously noted pulmonary nodules appear generally stable to slightly decreased compared to the prior examination, however, assessment is limited on today's study given the widespread peribronchovascular nodularity seen on today's examination. Although the appearance of today's study favors infectious/inflammatory etiologies, potentially related to significant aspiration, the possibility of developing areas of lymphangitic spread of disease are not excluded, particularly in the lung bases. 3. Borderline dilated loops of small bowel with multiple air-fluid levels. In addition, there is a large volume of liquid stool throughout the colon. Findings do not suggest obstruction, but may indicate an enteritis. 4. Enlarging area of soft tissue thickening and osteolysis in the  coccyx may reflect metastatic disease, or may indicate chronic osteomyelitis. 5. New trace right pleural effusion. 6. Additional findings, as above, similar to prior examinations. Electronically Signed   By: Vinnie Langton M.D.   On: 08/25/2015 11:17    Scheduled Meds: . ALPRAZolam  1 mg Oral BID  . collagenase   Topical Daily  . fluconazole (DIFLUCAN) IV  200 mg Intravenous Q24H  . lactose free nutrition  237 mL Oral TID WC & HS  . loperamide  4 mg Oral QID  . megestrol  200 mg Oral BID  . morphine  15 mg Oral Q12H  . multivitamin with minerals  1 tablet Oral Daily  . octreotide  200 mcg Subcutaneous 3 times per day  . saccharomyces boulardii  250 mg Oral BID   Continuous Infusions: . 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 [facility] Pager [phone]. If 7PM-7AM, please contact night-coverage at [email or web address], password [facility] 08/25/2015, 5:14 PM  LOS: 5 days

## 2015-08-25 NOTE — Progress Notes (Signed)
OT Cancellation Note  Patient Details Name: Grant [name] MRN: [id number] DOB: 02/03/1956   Cancelled Treatment:    Reason Eval/Treat Not Completed: Fatigue/lethargy limiting ability to participate -- patient fast asleep after receiving pain medication. Will check back at a later time.  [name] A 08/25/2015, 12:53 PM

## 2015-08-25 NOTE — Progress Notes (Signed)
Acute respiratory distress noted. O2 desat to 50%. Nonrebreather applies and rapid paged and in room.  Raliegh Ip Schorr and anesthesia paged and notified. Patient transported to 1229 with rapid and this nurse at side.

## 2015-08-26 ENCOUNTER — Other Ambulatory Visit (HOSPITAL_COMMUNITY): Payer: Medicaid Other

## 2015-08-26 DIAGNOSIS — J9690 Respiratory failure, unspecified, unspecified whether with hypoxia or hypercapnia: Secondary | ICD-10-CM

## 2015-08-26 DIAGNOSIS — E43 Unspecified severe protein-calorie malnutrition: Secondary | ICD-10-CM

## 2015-08-26 DIAGNOSIS — Z01818 Encounter for other preprocedural examination: Secondary | ICD-10-CM | POA: Insufficient documentation

## 2015-08-26 DIAGNOSIS — R627 Adult failure to thrive: Secondary | ICD-10-CM

## 2015-08-26 DIAGNOSIS — J189 Pneumonia, unspecified organism: Secondary | ICD-10-CM

## 2015-08-26 DIAGNOSIS — Z515 Encounter for palliative care: Secondary | ICD-10-CM

## 2015-08-26 LAB — CBC
HCT: 27.8 % — ABNORMAL LOW (ref 39.0–52.0)
Hemoglobin: 8.5 g/dL — ABNORMAL LOW (ref 13.0–17.0)
MCH: 25.9 pg — ABNORMAL LOW (ref 26.0–34.0)
MCHC: 30.6 g/dL (ref 30.0–36.0)
MCV: 84.8 fL (ref 78.0–100.0)
Platelets: 332 10*3/uL (ref 150–400)
RBC: 3.28 MIL/uL — ABNORMAL LOW (ref 4.22–5.81)
RDW: 18.1 % — ABNORMAL HIGH (ref 11.5–15.5)
WBC: 16.1 10*3/uL — ABNORMAL HIGH (ref 4.0–10.5)

## 2015-08-26 LAB — BASIC METABOLIC PANEL
Anion gap: 11 (ref 5–15)
BUN: 15 mg/dL (ref 6–20)
CO2: 17 mmol/L — ABNORMAL LOW (ref 22–32)
Calcium: 8.6 mg/dL — ABNORMAL LOW (ref 8.9–10.3)
Chloride: 117 mmol/L — ABNORMAL HIGH (ref 101–111)
Creatinine, Ser: 1.14 mg/dL (ref 0.61–1.24)
GFR calc Af Amer: >60 mL/min (ref >60)
GFR calc non Af Amer: >60 mL/min (ref >60)
Glucose, Bld: 171 mg/dL — ABNORMAL HIGH (ref 65–99)
Potassium: 3.7 mmol/L (ref 3.5–5.1)
Sodium: 145 mmol/L (ref 135–145)

## 2015-08-26 LAB — BLOOD GAS, ARTERIAL
Acid-base deficit: 11.2 mmol/L — ABNORMAL HIGH (ref 0.0–2.0)
Acid-base deficit: 11.5 mmol/L — ABNORMAL HIGH (ref 0.0–2.0)
Bicarbonate: 15.5 mEq/L — ABNORMAL LOW (ref 20.0–24.0)
Bicarbonate: 17.8 mEq/L — ABNORMAL LOW (ref 20.0–24.0)
Drawn by: 232811
Drawn by: 232811
FIO2: 0.8
FIO2: 1
LHR: 28 {breaths}/min
MECHVT: 500 mL
O2 SAT: 95.7 %
O2 Saturation: 96.3 %
PATIENT TEMPERATURE: 98
PCO2 ART: 39.5 mmHg (ref 35.0–45.0)
PEEP: 5 cmH2O
PEEP: 5 cmH2O
PH ART: 7.216 — AB (ref 7.350–7.450)
PO2 ART: 91.1 mmHg (ref 80.0–100.0)
Patient temperature: 98.2
RATE: 14 resp/min
TCO2: 15.2 mmol/L (ref 0–100)
TCO2: 18 mmol/L (ref 0–100)
VT: 500 mL
pCO2 arterial: 60.6 mmHg (ref 35.0–45.0)
pH, Arterial: 7.094 — CL (ref 7.350–7.450)
pO2, Arterial: 117 mmHg — ABNORMAL HIGH (ref 80.0–100.0)

## 2015-08-26 LAB — GLUCOSE, CAPILLARY
GLUCOSE-CAPILLARY: 120 mg/dL — AB (ref 65–99)
Glucose-Capillary: 162 mg/dL — ABNORMAL HIGH (ref 65–99)
Glucose-Capillary: 140 mg/dL — ABNORMAL HIGH (ref 65–99)
Glucose-Capillary: 138 mg/dL — ABNORMAL HIGH (ref 65–99)
Glucose-Capillary: 133 mg/dL — ABNORMAL HIGH (ref 65–99)

## 2015-08-26 LAB — HIV ANTIBODY (ROUTINE TESTING W REFLEX): HIV Screen 4th Generation wRfx: NONREACTIVE

## 2015-08-26 LAB — STREP PNEUMONIAE URINARY ANTIGEN: Strep Pneumo Urinary Antigen: NEGATIVE

## 2015-08-26 LAB — TRIGLYCERIDES: TRIGLYCERIDES: 174 mg/dL — AB (ref <150)

## 2015-08-26 LAB — MRSA PCR SCREENING: MRSA BY PCR: NEGATIVE

## 2015-08-26 MED ORDER — VITAL HIGH PROTEIN PO LIQD: 1000.0000 mL | ORAL | Status: DC

## 2015-08-26 MED ORDER — PROPOFOL 1000 MG/100ML IV EMUL
0.0000 ug/kg/min | INTRAVENOUS | Status: DC
  Administered 2015-08-26 (×2): 5 ug/kg/min via INTRAVENOUS
  Administered 2015-08-27: 3 ug/kg/min via INTRAVENOUS
  Filled 2015-08-26 (×2): qty 100

## 2015-08-26 MED ORDER — FENTANYL CITRATE (PF) 100 MCG/2ML IJ SOLN: 100.0000 ug | INTRAMUSCULAR | Status: DC | PRN

## 2015-08-26 MED ORDER — FAMOTIDINE IN NACL 20-0.9 MG/50ML-% IV SOLN
20.0000 mg | INTRAVENOUS | Status: DC
  Administered 2015-08-26 – 2015-08-28 (×3): 20 mg via INTRAVENOUS
  Filled 2015-08-26 (×3): qty 50

## 2015-08-26 MED ORDER — VITAL 1.5 CAL PO LIQD
1000.0000 mL | ORAL | Status: DC
  Administered 2015-08-26 – 2015-08-27 (×2): 1000 mL
  Filled 2015-08-26 (×3): qty 1000

## 2015-08-26 MED ORDER — ANTISEPTIC ORAL RINSE SOLUTION (CORINZ)
7.0000 mL | Freq: Four times a day (QID) | OROMUCOSAL | Status: DC
  Administered 2015-08-26 – 2015-08-27 (×6): 7 mL via OROMUCOSAL

## 2015-08-26 MED ORDER — STERILE WATER FOR INJECTION IV SOLN
INTRAVENOUS | Status: DC
  Administered 2015-08-26 – 2015-08-27 (×3): via INTRAVENOUS
  Filled 2015-08-26 (×4): qty 850

## 2015-08-26 MED ORDER — FENTANYL CITRATE (PF) 100 MCG/2ML IJ SOLN
25.0000 ug | INTRAMUSCULAR | Status: DC | PRN
  Administered 2015-08-26: 50 ug via INTRAVENOUS
  Filled 2015-08-26: qty 2

## 2015-08-26 MED ORDER — CHLORHEXIDINE GLUCONATE 0.12% ORAL RINSE (MEDLINE KIT)
15.0000 mL | Freq: Two times a day (BID) | OROMUCOSAL | Status: DC
  Administered 2015-08-26 – 2015-08-27 (×3): 15 mL via OROMUCOSAL

## 2015-08-26 MED ORDER — OCTREOTIDE ACETATE 100 MCG/ML IJ SOLN
100.0000 ug | Freq: Three times a day (TID) | INTRAMUSCULAR | Status: DC
  Administered 2015-08-26 – 2015-08-27 (×5): 100 ug via SUBCUTANEOUS
  Filled 2015-08-26 (×8): qty 1

## 2015-08-26 MED ORDER — PRO-STAT SUGAR FREE PO LIQD
30.0000 mL | Freq: Every day | ORAL | Status: DC
  Administered 2015-08-26 – 2015-08-27 (×2): 30 mL
  Filled 2015-08-26 (×2): qty 30

## 2015-08-26 NOTE — Progress Notes (Signed)
PULMONARY / CRITICAL CARE MEDICINE   Name: Grant [name] MRN: [id number] DOB: [birth year]    ADMISSION DATE:  09/09/2015 CONSULTATION DATE:  08/26/15  REFERRING MD:  Grandville Silos  CHIEF COMPLAINT:  Hypoxia  HISTORY OF PRESENT ILLNESS:  Pt is encephelopathic; therefore, this HPI is obtained from chart review. Grant [name] is a 60 y.o. male with PMH as outlined below including metastatic rectal CA, currently on salvage chemotherapy s/p cycle 5  FOLFIRI/panitumumab 08/11/15 (under the care of Dr. Learta Codding).  He was admitted 09/03/2015 with persistent diarrhea following chemo despite taking Imodium or Lomotil and also had pain in the sacrum and both legs.  He had generalized weakness and decreased oral intake along with failure to thrive.  He was admitted for supportive care and further evaluation.  On night of 08/25/15, pt had aspiration event followed by hypoxia and change in mental status.  Rapid response was called and pt was later transferred to ICU where he was intubated by anesthesia.  PCCM was called for further management.  Of note, he has been seen by Dr. Benay Spice this hospitalization who repeated staging CT 04/11 and was planning for hospice care if this showed no improvement following the FOLFIRI/panitumumab.  Code status was discussed with pt and HCPOA by Dr. Benay Spice and pt opted to continue with FULL CODE status for now.  SUBJECTIVE:  On vent,  More responsive.when propofol off Afebrile Poor UO with urostomy  VITAL SIGNS: BP 93/65 mmHg  Pulse 86  Temp(Src) 97.9 F (36.6 C) (Oral)  Resp 28  Ht 6\' 6"  (1.981 m)  Wt 140 lb 3.4 oz (63.6 kg)  BMI 16.21 kg/m2  SpO2 100%  HEMODYNAMICS:    VENTILATOR SETTINGS: Vent Mode:  [-] PRVC FiO2 (%):  [40 %-100 %] 40 % Set Rate:  [14 bmp-28 bmp] 28 bmp Vt Set:  [500 mL] 500 mL PEEP:  [5 cmH20] 5 cmH20 Plateau Pressure:  [18 cmH20-20 cmH20] 18 cmH20  INTAKE / OUTPUT: I/O last 3 completed shifts: In: 5282.7 [I.V.:5132.7; IV  Piggyback:150] Out: 3500 [Urine:1300; Stool:2200]   PHYSICAL EXAMINATION: General: Adult male, chronically ill appearing, in NAD. Neuro: Sedated, follows commands HEENT: [place]/AT. PERRL, sclerae anicteric. Cardiovascular: RRR, no M/R/G.  Lungs: Respirations even and unlabored.  CTA bilaterally, No W/R/R. Abdomen: BS x 4, soft, NT/ND.  Colostomy and urostomy in place. Musculoskeletal: No gross deformities, no edema.  Skin: Intact, warm, no rashes.  LABS:  BMET  Recent Labs Lab 08/24/15 0545 08/25/15 0551 08/26/15 0311  NA 138 143 145  K 3.2* 3.5 3.7  CL 110 116* 117*  CO2 22 18* 17*  BUN 14 12 15   CREATININE 1.14 1.05 1.14  GLUCOSE 115* 115* 171*    Electrolytes  Recent Labs Lab 09/03/2015 1906  08/21/15 0500 08/22/15 0502  08/24/15 0545 08/25/15 0551 08/26/15 0311  CALCIUM  --   < > 8.9 8.7*  < > 8.7* 8.4* 8.6*  MG 1.5*  --  1.4* 2.0  --   --   --   --   PHOS 2.6  --   --   --   --   --   --   --   < > = values in this interval not displayed.  CBC  Recent Labs Lab 08/24/15 0545 08/25/15 0551 08/26/15 0311  WBC 7.3 12.0* 16.1*  HGB 8.4* 8.4* 8.5*  HCT 27.0* 27.0* 27.8*  PLT 355 392 332    Coag's No results for input(s): APTT, INR in the last 168 hours.  Sepsis Markers No results for input(s): LATICACIDVEN, PROCALCITON, O2SATVEN in the last 168 hours.  ABG  Recent Labs Lab 08/26/15 0010 08/26/15 0145  PHART 7.094* 7.216*  PCO2ART 60.6* 39.5  PO2ART 117* 91.1    Liver Enzymes  Recent Labs Lab 09/06/2015 1438  AST 9  ALT <9  ALKPHOS 171*  BILITOT 0.88  ALBUMIN 2.9*    Cardiac Enzymes No results for input(s): TROPONINI, PROBNP in the last 168 hours.  Glucose  Recent Labs Lab 08/24/15 0649 08/24/15 0725 08/25/15 0602 08/25/15 0752 08/25/15 2348 08/26/15 0832  GLUCAP 99 98 96 102* 162* 140*    Imaging Ct Abdomen Pelvis W Wo Contrast  08/25/2015  CLINICAL DATA:  60 year old male with history of rectal cancer diagnosed in  2015, with renal metastasis diagnosed in 2016, as well as lung metastases and bone metastases. Ongoing chemotherapy. Increased colostomy output. Diffuse pain. EXAM: CT CHEST, ABDOMEN, AND PELVIS WITH CONTRAST TECHNIQUE: Multidetector CT imaging of the chest, abdomen and pelvis was performed following the standard protocol during bolus administration of intravenous contrast. CONTRAST:  25mL ISOVUE-300 IOPAMIDOL (ISOVUE-300) INJECTION 61% COMPARISON:  CT of the chest, abdomen and pelvis 06/02/2015. FINDINGS: CT CHEST FINDINGS Mediastinum/Lymph Nodes: Heart size is normal. There is no significant pericardial fluid, thickening or pericardial calcification. There is atherosclerosis of the thoracic aorta, the great vessels of the mediastinum and the coronary arteries, including calcified atherosclerotic plaque in the left anterior descending coronary artery. Mildly enlarged low right paratracheal lymph node measuring 1 cm in short axis. Enlarged low left paratracheal lymph node measuring 1 cm in short axis. No hilar lymphadenopathy. Esophagus is unremarkable in appearance. No axillary lymphadenopathy. Lungs/Pleura: Evaluation of the lung parenchyma is limited by considerable patient respiratory motion on today's examination. Previously noted pulmonary nodules in the right middle lobe appear slightly smaller and less distinct than the prior study, largest of which measures 8 mm on today's examination (image 83 of series 8), suggesting some positive response to therapy. Previously noted nodule in the base of the left lower lobe currently measures 13 x 9 mm (image 74 of series 8) and is similar to the prior examination. Previously noted nodules in the right lung base are now obscured by considerable airspace consolidation dependently, and extensive peribronchovascular micronodularity which is present throughout all aspects of the lung bases bilaterally, but is most severe in the right lower lobe. Many of the right lower lobe  bronchi in the basal segments appear completely opacified by retained secretions. Similar findings are present to a lesser degree in the left lower lobe. This patchy peribronchovascular micronodularity is present throughout all aspects of the lungs bilaterally extending into the lung apices, but does have a basal predominance where there is also some associated septal thickening and very mild septal nodularity. Trace right pleural effusion lying dependently. Musculoskeletal/Soft Tissues: There are no aggressive appearing lytic or blastic lesions noted in the visualized portions of the skeleton. CT ABDOMEN AND PELVIS FINDINGS Hepatobiliary: In the inferior aspect of segment 6 of the liver there appears to be direct invasion of the hepatic parenchyma from the patient's large right renal metastasis. This is best appreciated on coronal image 59 of series 604. No other suspicious cystic or solid hepatic lesions are noted. No intra or extrahepatic biliary ductal dilatation. Diffuse periportal edema. Gallbladder is moderately distended. However, there are no gallstones, and no pericholecystic fluid or surrounding inflammatory changes to suggest an acute cholecystitis at this time. Pancreas: No pancreatic mass. No pancreatic ductal dilatation. No pancreatic  or peripancreatic fluid or inflammatory changes. Spleen: Unremarkable. Adrenals/Urinary Tract: Bilateral adrenal glands and the left kidney are normal in appearance. Again noted are multiple infiltrative masses within the right kidney related to known metastatic disease. On today's study this currently measures approximately 13.5 x 9.0 x 13.0 cm (slightly smaller than the prior examination), and is highly invasive into adjacent structures, including the under surface of segment 6 of the liver and posteriorly into the right psoas, quadratus lumborum and right flank musculature. Status post radical cystectomy with ileal conduit. Stomach/Bowel: The appearance of the stomach  is normal. There multiple borderline dilated loops of small bowel demonstrating air-fluid levels. These measure up to 3.8 cm in diameter. Status post low anterior resection. Left lower quadrant colostomy. Large amount of liquid stool in the colon. Vascular/Lymphatic: Atherosclerosis throughout the abdominal and pelvic vasculature, without evidence of aneurysm or dissection. Multiple borderline enlarged retroperitoneal lymph nodes are again noted, but there are no definite pathologically enlarged lymph nodes identified in the abdomen or pelvis. Reproductive: Status post radical prostatectomy. Other: Soft tissue thickening in the presacral space and along the right pelvic sidewall, similar to prior examinations, presumably post treatment related. Trace volume of ascites. No pneumoperitoneum. Mild diffuse mesenteric edema. Musculoskeletal: Again noted is some osteolysis and associated soft tissue thickening of the coccyx, which currently measures up to 4.4 x 4.1 cm. Whether or not this is from an osseous metastasis or is related to chronic osteomyelitis is uncertain, but this appears to have progressed slightly compared to the prior examination. IMPRESSION: 1. Today's study demonstrates some positive response to therapy with slight decreased size of the highly infiltrative right renal mass. This mass continues to show direct invasion of segment 6 of the liver, the right psoas muscle, right quadratus lumborum and other posterior right-sided flank musculature. 2. Previously noted pulmonary nodules appear generally stable to slightly decreased compared to the prior examination, however, assessment is limited on today's study given the widespread peribronchovascular nodularity seen on today's examination. Although the appearance of today's study favors infectious/inflammatory etiologies, potentially related to significant aspiration, the possibility of developing areas of lymphangitic spread of disease are not excluded,  particularly in the lung bases. 3. Borderline dilated loops of small bowel with multiple air-fluid levels. In addition, there is a large volume of liquid stool throughout the colon. Findings do not suggest obstruction, but may indicate an enteritis. 4. Enlarging area of soft tissue thickening and osteolysis in the coccyx may reflect metastatic disease, or may indicate chronic osteomyelitis. 5. New trace right pleural effusion. 6. Additional findings, as above, similar to prior examinations. Electronically Signed   By: Vinnie Langton M.D.   On: 08/25/2015 11:17   Ct Chest W Contrast  08/25/2015  CLINICAL DATA:  60 year old male with history of rectal cancer diagnosed in 2015, with renal metastasis diagnosed in 2016, as well as lung metastases and bone metastases. Ongoing chemotherapy. Increased colostomy output. Diffuse pain. EXAM: CT CHEST, ABDOMEN, AND PELVIS WITH CONTRAST TECHNIQUE: Multidetector CT imaging of the chest, abdomen and pelvis was performed following the standard protocol during bolus administration of intravenous contrast. CONTRAST:  20mL ISOVUE-300 IOPAMIDOL (ISOVUE-300) INJECTION 61% COMPARISON:  CT of the chest, abdomen and pelvis 06/02/2015. FINDINGS: CT CHEST FINDINGS Mediastinum/Lymph Nodes: Heart size is normal. There is no significant pericardial fluid, thickening or pericardial calcification. There is atherosclerosis of the thoracic aorta, the great vessels of the mediastinum and the coronary arteries, including calcified atherosclerotic plaque in the left anterior descending coronary artery. Mildly enlarged  low right paratracheal lymph node measuring 1 cm in short axis. Enlarged low left paratracheal lymph node measuring 1 cm in short axis. No hilar lymphadenopathy. Esophagus is unremarkable in appearance. No axillary lymphadenopathy. Lungs/Pleura: Evaluation of the lung parenchyma is limited by considerable patient respiratory motion on today's examination. Previously noted pulmonary  nodules in the right middle lobe appear slightly smaller and less distinct than the prior study, largest of which measures 8 mm on today's examination (image 83 of series 8), suggesting some positive response to therapy. Previously noted nodule in the base of the left lower lobe currently measures 13 x 9 mm (image 74 of series 8) and is similar to the prior examination. Previously noted nodules in the right lung base are now obscured by considerable airspace consolidation dependently, and extensive peribronchovascular micronodularity which is present throughout all aspects of the lung bases bilaterally, but is most severe in the right lower lobe. Many of the right lower lobe bronchi in the basal segments appear completely opacified by retained secretions. Similar findings are present to a lesser degree in the left lower lobe. This patchy peribronchovascular micronodularity is present throughout all aspects of the lungs bilaterally extending into the lung apices, but does have a basal predominance where there is also some associated septal thickening and very mild septal nodularity. Trace right pleural effusion lying dependently. Musculoskeletal/Soft Tissues: There are no aggressive appearing lytic or blastic lesions noted in the visualized portions of the skeleton. CT ABDOMEN AND PELVIS FINDINGS Hepatobiliary: In the inferior aspect of segment 6 of the liver there appears to be direct invasion of the hepatic parenchyma from the patient's large right renal metastasis. This is best appreciated on coronal image 59 of series 604. No other suspicious cystic or solid hepatic lesions are noted. No intra or extrahepatic biliary ductal dilatation. Diffuse periportal edema. Gallbladder is moderately distended. However, there are no gallstones, and no pericholecystic fluid or surrounding inflammatory changes to suggest an acute cholecystitis at this time. Pancreas: No pancreatic mass. No pancreatic ductal dilatation. No  pancreatic or peripancreatic fluid or inflammatory changes. Spleen: Unremarkable. Adrenals/Urinary Tract: Bilateral adrenal glands and the left kidney are normal in appearance. Again noted are multiple infiltrative masses within the right kidney related to known metastatic disease. On today's study this currently measures approximately 13.5 x 9.0 x 13.0 cm (slightly smaller than the prior examination), and is highly invasive into adjacent structures, including the under surface of segment 6 of the liver and posteriorly into the right psoas, quadratus lumborum and right flank musculature. Status post radical cystectomy with ileal conduit. Stomach/Bowel: The appearance of the stomach is normal. There multiple borderline dilated loops of small bowel demonstrating air-fluid levels. These measure up to 3.8 cm in diameter. Status post low anterior resection. Left lower quadrant colostomy. Large amount of liquid stool in the colon. Vascular/Lymphatic: Atherosclerosis throughout the abdominal and pelvic vasculature, without evidence of aneurysm or dissection. Multiple borderline enlarged retroperitoneal lymph nodes are again noted, but there are no definite pathologically enlarged lymph nodes identified in the abdomen or pelvis. Reproductive: Status post radical prostatectomy. Other: Soft tissue thickening in the presacral space and along the right pelvic sidewall, similar to prior examinations, presumably post treatment related. Trace volume of ascites. No pneumoperitoneum. Mild diffuse mesenteric edema. Musculoskeletal: Again noted is some osteolysis and associated soft tissue thickening of the coccyx, which currently measures up to 4.4 x 4.1 cm. Whether or not this is from an osseous metastasis or is related to chronic osteomyelitis is  uncertain, but this appears to have progressed slightly compared to the prior examination. IMPRESSION: 1. Today's study demonstrates some positive response to therapy with slight decreased  size of the highly infiltrative right renal mass. This mass continues to show direct invasion of segment 6 of the liver, the right psoas muscle, right quadratus lumborum and other posterior right-sided flank musculature. 2. Previously noted pulmonary nodules appear generally stable to slightly decreased compared to the prior examination, however, assessment is limited on today's study given the widespread peribronchovascular nodularity seen on today's examination. Although the appearance of today's study favors infectious/inflammatory etiologies, potentially related to significant aspiration, the possibility of developing areas of lymphangitic spread of disease are not excluded, particularly in the lung bases. 3. Borderline dilated loops of small bowel with multiple air-fluid levels. In addition, there is a large volume of liquid stool throughout the colon. Findings do not suggest obstruction, but may indicate an enteritis. 4. Enlarging area of soft tissue thickening and osteolysis in the coccyx may reflect metastatic disease, or may indicate chronic osteomyelitis. 5. New trace right pleural effusion. 6. Additional findings, as above, similar to prior examinations. Electronically Signed   By: Vinnie Langton M.D.   On: 08/25/2015 11:17   Dg Chest Port 1 View  08/25/2015  CLINICAL DATA:  Endotracheal tube placement.  Initial encounter. EXAM: PORTABLE CHEST 1 VIEW COMPARISON:  Chest radiograph performed 08/19/2015 FINDINGS: The patient's endotracheal tube is seen ending 5 cm above the carina. An enteric tube is noted coiling within the stomach. A right-sided chest port is noted ending about the cavoatrial junction. Patchy right basilar airspace opacity may reflect pneumonia or asymmetric interstitial edema. No pleural effusion or pneumothorax is seen. The cardiomediastinal silhouette is normal in size. No acute osseous abnormalities are identified. IMPRESSION: 1. Endotracheal tube seen ending 5 cm above the  carina. 2. Patchy right basilar airspace opacity may reflect pneumonia or asymmetric interstitial edema. Electronically Signed   By: Garald Balding M.D.   On: 08/25/2015 23:56     STUDIES:  CT A / P / chest 04/11 > some positive response to therapy with slight decreased size of highly infiltrative renal mass.  Mass continues to show direct invasion of liver, right psoas, right quadratus lumborum, and other right sided flank musculature.  Pulmonary nodules appear generally stable.  Enlarging area of soft tissue thickening and osteolysis in the coccyx may reflect metastatic disease or may indicate chronic osteomyelitis.  New trace right pleural effusion. CXR 04/11 > patchy right basilar airspace opacity.  CULTURES: Blood 04/11 >ng Urine 04/06 > > 100,000 yeast.  ANTIBIOTICS: Vanc 04/11 > Zosyn 04/11 > Fluconazole 04/08 >  SIGNIFICANT EVENTS: 04/06 > admitted. 04/11 > hypoxic respiratory failure following aspiration > transferred to ICU > intubated.  LINES/TUBES: ETT 04/11 >  DISCUSSION: 60 y.o. M with hx metastatic rectal CA, s/p cycle 5 FOLFIRI / panitumumab 08/11/15 (followed by Dr. Benay Spice).  Admitted 04/06 with diarrhea and FTT.  Had aspiration event evening of 04/11 followed by AMS and hypoxic respiratory failure prompting transfer to ICU where he was intubated by anesthesia.  ASSESSMENT / PLAN:  HEMATOLOGIC / ONCOLOGIC A:   Metastatic rectal CA, s/p cycle 5 FOLFIRI / panitumumab 08/11/15 (followed by Dr. Benay Spice). Anemia - chronic. VTE Prophylaxis. P:  Oncology following, appreciate the assistance. SCD's / Lovenox. CBC in AM.  PULMONARY A: Acute hypoxic respiratory failure. Aspiration PNA. P:   VAP prevention measures. SBTs  INFECTIOUS A:   Aspiration PNA. Candiduria. P:  Abx / antifungals as above (Vanc / Zosyn / Diflucan).  Follow cultures as above. PCT algorithm not helpful in this immunocompromised host.  NEUROLOGIC A:   Acute metabolic  encephalopathy. Failure to thrive - due to chemo / metastatic rectal CA. P:   Sedation:  Propofol gtt / Fentanyl PRN. RASS goal: 0 to -1. Daily WUA. D/c oxycodone, morphine, alprazolam. Palliative care consult.  RENAL A:   NAGMA - due to diarrhea. Pseudohypocalcemia. P:   HCO3 @ 75. Assess ionized calcium.   GASTROINTESTINAL A:   Diarrhea - likely secondary to chemo. GI prophylaxis. Nutrition. P:   SUP: Famotidine. NPO. Nutrition consult for TF's.  CARDIOVASCULAR A:  No acute issues. P:  No interventions required.  ENDOCRINE A:   No acute issues. P:   No interventions required.   Family updated: [name] (girlfriend who is also POA) updated at bedside.   Interdisciplinary Family Meeting v Palliative Care Meeting:  Due by: Sep 20, 2015.  Summary - Intubated due to resp + NAG acidosis, poor prognosis overall, would advocate for one way extubation soon once acidosis improves with bicarb  The patient is critically ill with multiple organ systems failure and requires high complexity decision making for assessment and support, frequent evaluation and titration of therapies, application of advanced monitoring technologies and extensive interpretation of multiple databases. Critical Care Time devoted to patient care services described in this note independent of APP time is 35 minutes.    Kara Mead MD. Shade Flood. [facility] Pager [phone] If no response call [phone]     08/26/2015, 9:42 AM

## 2015-08-26 NOTE — Progress Notes (Signed)
IP PROGRESS NOTE  Subjective:   Grant [name] developed respiratory failure last night and was intubated. He is sedated and on the ventilator.  Objective: Vital signs in last 24 hours: Blood pressure 123/85, pulse 90, temperature 97.9 F (36.6 C), temperature source Oral, resp. rate 12, height 6\' 6"  (1.981 m), weight 140 lb 3.4 oz (63.6 kg), SpO2 97 %.  Intake/Output from previous day: 04/11 0701 - 04/12 0700 In: 5282.7 [I.V.:5132.7; IV Piggyback:150] Out: 1500 [Urine:800; Stool:700]  Physical Exam: Lungs: Moves air bilaterally Cardiac: Regular rate and rhythm   Abdomen: Right lower quadrant urostomy, left lower quadrant colostomy with semi-formed, the abdomen is soft   neurologic: Lethargic, opens eyes   Portacath/PICC-without erythema  Lab Results:  Recent Labs  08/25/15 0551 08/26/15 0311  WBC 12.0* 16.1*  HGB 8.4* 8.5*  HCT 27.0* 27.8*  PLT 392 332   BMET  Recent Labs  08/25/15 0551 08/26/15 0311  NA 143 145  K 3.5 3.7  CL 116* 117*  CO2 18* 17*  GLUCOSE 115* 171*  BUN 12 15  CREATININE 1.05 1.14  CALCIUM 8.4* 8.6*     Medications: I have reviewed the patient's current medications.  Assessment/Plan:  1. Metastatic rectal cancer, status post cycle 5 FOLFIRI/panitumumab 08/11/2015  Restaging CT 08/26/2015 with progressive airspace disease in the lower chest, decreased size of lung nodules and right renal mass, slight progression of mass at the coccyx 2. History of right hydronephrosis, right renal abscess, status post placement of a right ureter stent 3. Pain at the sacrum and legs following the APR/sacrectomy, positive FDG at the tip at the sacrum on a PET 03/05/2015 4. Diarrhea secondary to chemotherapy-continue Imodium, Lomotil, and octreotide 5. Severe neutropenia secondary to chemotherapy-the white count has recovered 6. Sacral decubitus ulcer 7.  Altered mental status-likely hospital delirium, hypoxia, and narcotics 8. Respiratory failure  08/25/2015-likely secondary to aspiration, intubated 08/25/2015   Grant [name] developed respiratory failure yesterday. He is now intubated. I discussed the CT findings with his significant other. I reviewed the CT images. The metastatic lung nodules and right renal mass appears significantly improved. I plan to continue FOLFIRI/panitumumab if he recovers to baseline. If his performance status does not improve following treatment of respiratory failure, he will not be a candidate for further systemic therapy.  The diarrhea has slowed in the stool appears more formed today. I will decrease the octreotide dose and discontinue octreotide if the diarrhea is further improved tomorrow.  Recommendations  1. Antibiotics, management of respiratory failure and acidosis per critical care medicine 2. Decrease the octreotide dose 3. Continue discussions with significant other and patient regarding CODE STATUS   LOS: 6 days   Betsy Coder, MD   08/26/2015, 2:46 PM

## 2015-08-26 NOTE — Consult Note (Signed)
Consultation Note Date: 08/26/2015   Patient Name: Grant [name]  DOB: May 13, 1956  MRN: [id number]  Age / Sex: 60 y.o., male  PCP: No primary care provider on file. Referring Physician: Javier Glazier, MD  Reason for Consultation: Establishing goals of care  Clinical Assessment/Narrative: Grant [name] is a 60 y.o. male with PMH metastatic rectal CA, currently on salvage chemotherapy s/p cycle 5 FOLFIRI/panitumumab 08/11/15 (under the care of Dr. Learta Codding). He was admitted 09/09/2015 with persistent diarrhea following chemo despite taking Imodium or Lomotil and also had pain in the sacrum and both legs. He had generalized weakness and decreased oral intake along with failure to thrive. He was admitted for supportive care and further evaluation.  On night of 08/25/15, pt had aspiration event followed by hypoxia and change in mental status. Rapid response was called and pt was later transferred to ICU where he was intubated by anesthesia.  Palliative consulted for goals of care.  I met today with the patient's long-term girlfriend, Grant [name].  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  She states that she feels unclear exactly what has been going on with his care this admission and "everyone just wants to talk about his CODE STATUS.  No what is focusing on what is going on now."  She reports that she was present for conversation the patient had with Dr. Benay Spice when he stated his desire to remain a full code.  Her feeling is "he made his wishes known."   SUMMARY OF RECOMMENDATIONS - I met today with patient's long term girlfriend, Grant [name] - The purpose of my visit today was to begin to build rapport.  She reports being "tired of everyone asking about his code status."  I specifically did not address today for this reason.   - Will plan to f/u tomorrow to  continue conversation  Code Status/Advance Care Planning: Full code    Code Status Orders        Start     Ordered   08/15/2015 1733  Full code   Continuous     08/28/2015 1742    Code Status History    Date Active Date Inactive Code Status Order ID Comments User Context   05/15/2014 12:33 PM 05/22/2014  3:44 PM Full Code [id number]  Jacqulynn Cadet, MD Inpatient   05/13/2014  4:50 PM 05/15/2014 12:33 PM Full Code [id number]  [name], MD Inpatient   03/12/2014  3:50 PM 03/13/2014  3:39 AM Full Code [id number]  Azzie Roup, MD [place]   12/10/2013 10:44 PM 12/13/2013  4:36 AM Full Code [id number]  Berle Mull, MD ED   10/18/2013  5:32 PM 10/20/2013  6:25 PM Full Code [id number]  Markus Daft, MD Inpatient   10/13/2013  7:39 AM 10/18/2013  5:32 PM Full Code [id number]  Sandi Mariscal, MD Inpatient   10/09/2013  8:58 PM 10/13/2013  7:39 AM Full Code [id number]  [name], MD Inpatient   10/09/2013  7:15 PM 10/09/2013  8:58 PM Full Code [id number]  Shanda Howells, MD ED    Advance Directive Documentation        Most Recent Value   Type of Advance Directive  Healthcare Power of Attorney, Living will   Pre-existing out of facility DNR order (yellow form or pink MOST form)     "MOST" Form in Place?       Prognosis: Unable to determine  Discharge Planning: To be  determined   Chief Complaint/ Primary Diagnoses: Present on Admission:  . Anemia of chronic disease . Severe protein-calorie malnutrition ([place]) . Microcytic anemia . Rectal cancer ([place]) . Dehydration . Diarrhea . CA of rectum ([place]) . Rectal cancer metastasized to lung ([place]) . FTT (failure to thrive) in adult . Ureteral obstruction, right . Ulcer of sacral region . Neutropenia ([place]) . UTI (urinary tract infection) . Candiduria  I have reviewed the medical record, interviewed the patient and family, and examined the patient. The following aspects are pertinent.  Past Medical History  Diagnosis Date  . Fecal incontinence   . Chronic  kidney disease     right stent  . Hx of radiation therapy 12/18/13-01/29/14    RECTAL / 54gy  . Rectal cancer ([place])  10/11/13 bx     rectum adenocarcinoma   . Cancer ([place]) 10/11/13    rectal cancer-adenocarcinoma   Social History   Social History  . Marital Status: Single    Spouse Name: N/A  . Number of Children: N/A  . Years of Education: N/A   Social History Main Topics  . Smoking status: Former Smoker    Quit date: 05/16/1988  . Smokeless tobacco: Current User    Types: Snuff  . Alcohol Use: Yes     Comment: OCCASIONAL   . Drug Use: Yes    Special: Marijuana  . Sexual Activity: Not Asked   Other Topics Concern  . None   Social History Narrative   Family History  Problem Relation Age of Onset  . Cancer Father     pancreatic   . CAD Father   . Cancer Maternal Uncle   . Colon cancer Mother   . Pancreatic cancer Mother    Scheduled Meds: . antiseptic oral rinse  7 mL Mouth Rinse QID  . chlorhexidine gluconate (SAGE KIT)  15 mL Mouth Rinse BID  . collagenase   Topical Daily  . enoxaparin (LOVENOX) injection  40 mg Subcutaneous QHS  . famotidine (PEPCID) IV  20 mg Intravenous Q24H  . feeding supplement (PRO-STAT SUGAR FREE 64)  30 mL Per Tube Daily  . fluconazole (DIFLUCAN) IV  200 mg Intravenous Q24H  . octreotide  100 mcg Subcutaneous TID  . piperacillin-tazobactam (ZOSYN)  IV  3.375 g Intravenous Q8H  . saccharomyces boulardii  250 mg Oral BID  . vancomycin  750 mg Intravenous Q12H   Continuous Infusions: . feeding supplement (VITAL 1.5 CAL) 1,000 mL (08/26/15 1259)  . propofol (DIPRIVAN) infusion 5 mcg/kg/min (08/26/15 1715)  .  sodium bicarbonate 150 mEq in sterile water 1000 mL infusion 125 mL/hr at 08/26/15 0951   PRN Meds:.acetaminophen **OR** acetaminophen, diphenoxylate-atropine, fentaNYL (SUBLIMAZE) injection, ipratropium, levalbuterol, lidocaine-prilocaine, ondansetron **OR** ondansetron (ZOFRAN) IV, prochlorperazine, prochlorperazine Medications  Prior to Admission:  Prior to Admission medications   Medication Sig Start Date End Date Taking? Authorizing Provider  ALPRAZolam (XANAX) 1 MG tablet TAKE 1 TABLET BY MOUTH TWICE A DAY AS NEEDED FOR ANXIETY Patient taking differently: Take 1 mg by mouth 2 (two) times daily.  08/19/15  Yes [name], MD  diphenoxylate-atropine (LOMOTIL) 2.5-0.025 MG tablet Take 1-2 tablets by mouth 4 (four) times daily as needed for diarrhea or loose stools. 08/19/15  Yes [name], MD  lactose free nutrition (BOOST PLUS) LIQD Take 237 mLs by mouth 4 (four) times daily -  with meals and at bedtime. From four to eight times daily. 05/22/14  Yes [name], MD  lidocaine-prilocaine (EMLA) cream Apply  1 application topically as needed. Apply to [facility] site 1-2 hours prior to stick and cover with plastic wrap 03/12/14  Yes [name], MD  loperamide (IMODIUM A-D) 2 MG tablet Take 2 mg by mouth 4 (four) times daily as needed for diarrhea or loose stools.   Yes Historical Provider, MD  megestrol (MEGACE) 400 MG/10ML suspension Take 5 mLs (200 mg total) by mouth 2 (two) times daily. 05/20/15  Yes [name], MD  morphine (MS CONTIN) 15 MG 12 hr tablet Take 1 tablet (15 mg total) by mouth every 12 (twelve) hours. 08/11/15  Yes [name], MD  Multiple Vitamins-Minerals ([employer] SPECTRAVITE ULTRA MENS) TABS Take 1 tablet by mouth every morning.  10/20/13  Yes Historical Provider, MD  ondansetron (ZOFRAN) 4 MG tablet Take 1 tablet (4 mg total) by mouth every 8 (eight) hours as needed for nausea or vomiting. 11/14/14  Yes [name], MD  oxyCODONE (OXY IR/ROXICODONE) 5 MG immediate release tablet Take 1-2 tablets every 4 hours as needed for pain. 07/29/15  Yes Owens Shark, NP  prochlorperazine (COMPAZINE) 10 MG tablet Take 1 tablet (10 mg total) by mouth every 6 (six) hours as needed for nausea or vomiting. 08/25/14  Yes [name], MD  minocycline (MINOCIN) 100 MG capsule Take 1 capsule (100 mg total) by  mouth 2 (two) times daily. Patient not taking: Reported on 08/26/2015 06/03/15   Owens Shark, NP   Allergies  Allergen Reactions  . Strawberry Extract Anaphylaxis and Hives  . Sunflower Oil Anaphylaxis and Hives    seeds  . Watermelon [Citrullus Vulgaris] Anaphylaxis and Hives  . Gabapentin     Other reaction(s): Other (See Comments) Pt states a few months ago he was prescribed this medication, it caused him to have both auditory and visual hallucinations  . Other     All Melons cantaloupes, etc    Review of Systems   Unable to obtain  Physical Exam  General: Intubated.  Unresponsive.  HEENT: No bruits, no goiter, no JVD Heart: Regular rate and rhythm. No murmur appreciated. Abdomen: Soft, nontender, nondistended, positive bowel sounds.  Ext: No significant edema Skin: Warm and dry  Vital Signs: BP 112/69 mmHg  [name] 84  Temp(Src) 97.9 F (36.6 C) (Oral)  Resp 28  Ht 6' 6"  (1.981 m)  Wt 63.6 kg (140 lb 3.4 oz)  BMI 16.21 kg/m2  SpO2 100%  SpO2: SpO2: 100 % O2 Device:SpO2: 100 % O2 Flow Rate: .   IO: Intake/output summary:  Intake/Output Summary (Last 24 hours) at 08/26/15 2015 Last data filed at 08/26/15 1802  Gross per 24 hour  Intake 7146.85 ml  Output   1425 ml  Net 5721.85 ml    LBM: Last BM Date: 08/25/15 Baseline Weight: Weight: 59.5 kg (131 lb 2.8 oz) Most recent weight: Weight: 63.6 kg (140 lb 3.4 oz)      Palliative Assessment/Data:  Flowsheet Rows        Most Recent Value   Intake Tab    Referral Department  Pulmonary   Unit at Time of Referral  ICU   Palliative Care Primary Diagnosis  Cancer   Date Notified  08/25/15   Palliative Care Type  New Palliative care   Date of Admission  09/11/2015   Date first seen by Palliative Care  08/26/15   # of days Palliative referral response time  1 Day(s)   # of days IP prior to Palliative referral  5  Clinical Assessment    Palliative Performance Scale Score  10%   Pain Max last 24 hours  Not able  to report   Pain Min Last 24 hours  Not able to report   Psychosocial & Spiritual Assessment    Palliative Care Outcomes    Patient/Family meeting held?  Yes   Who was at the meeting?  Long term girlfriend/POA   Palliative Care Outcomes  Clarified goals of care      Additional Data Reviewed:  CBC:    Component Value Date/Time   WBC 16.1* 08/26/2015 0311   WBC 0.5* 09/03/2015 1438   HGB 8.5* 08/26/2015 0311   HGB 9.6* 08/19/2015 1438   HCT 27.8* 08/26/2015 0311   HCT 29.1* 08/18/2015 1438   PLT 332 08/26/2015 0311   PLT 277 09/07/2015 1438   MCV 84.8 08/26/2015 0311   MCV 79.9 09/12/2015 1438   NEUTROABS 1.7 08/22/2015 0502   NEUTROABS 0.2* 08/31/2015 1438   LYMPHSABS 0.4* 08/22/2015 0502   LYMPHSABS 0.1* 08/23/2015 1438   MONOABS 0.1 08/22/2015 0502   MONOABS 0.1 08/15/2015 1438   EOSABS 0.0 08/22/2015 0502   EOSABS 0.0 09/03/2015 1438   BASOSABS 0.0 08/22/2015 0502   BASOSABS 0.0 09/13/2015 1438   Comprehensive Metabolic Panel:    Component Value Date/Time   NA 145 08/26/2015 0311   NA 134* 09/13/2015 1438   K 3.7 08/26/2015 0311   K 4.1 08/31/2015 1438   CL 117* 08/26/2015 0311   CO2 17* 08/26/2015 0311   CO2 25 09/05/2015 1438   BUN 15 08/26/2015 0311   BUN 17.8 09/02/2015 1438   CREATININE 1.14 08/26/2015 0311   CREATININE 1.3 08/31/2015 1438   GLUCOSE 171* 08/26/2015 0311   GLUCOSE 127 09/07/2015 1438   CALCIUM 8.6* 08/26/2015 0311   CALCIUM 9.5 08/19/2015 1438   AST 9 08/16/2015 1438   AST 18 05/13/2014 1700   ALT <9 08/31/2015 1438   ALT 12 05/13/2014 1700   ALKPHOS 171* 08/18/2015 1438   ALKPHOS 74 05/13/2014 1700   BILITOT 0.88 09/12/2015 1438   BILITOT 0.6 05/13/2014 1700   PROT 6.4 08/26/2015 1438   PROT 6.2 05/13/2014 1700   ALBUMIN 2.9* 09/02/2015 1438   ALBUMIN 2.7* 05/13/2014 1700     Time In: 1820 Time Out: 1900 Time Total: 40 Greater than 50%  of this time was spent counseling and coordinating care related to the above  assessment and plan.  Signed by: Micheline Rough, MD  Micheline Rough, MD  08/26/2015, 8:15 PM  Please contact Palliative Medicine Team phone at [phone] for questions and concerns.

## 2015-08-26 NOTE — Progress Notes (Addendum)
Rapid Response Event Note  Overview:  Rapid response nurse paged for respiratory distress and altered mental status by patient primary nurse.     Initial Focused Assessment: On exam patient obtunded with shallow respirations. Patient on 6L nasal canula on arrival to room with oxygen saturations in 50s. Patient placed on 100% NRB with oxygen saturation improving to 80%. Interventions: Patient placed on NRB and emergently transferred to ICU with rapid response nurse, primary nurse, respiratory therapist and CRNA for intubation. [facility] NP (K.Schorr) paged and notified about patient status. [place] physician (Dr. Oletta Darter) made aware of patient arrival to ICU and need for CCM consult for vent management.   Event Summary:  08/25/15 at  2248    at          [place], Tia Alert

## 2015-08-26 NOTE — Progress Notes (Signed)
Shift event note: Notified by RN that pt found w/ 02 sats in the 50's on 6L [place] and obtunded. RR RN was paged and responded to bedside. Pt was emergently transferred to ICU and anesthesia was called for intubation. RN reports that just prior to this event pt was eating and began coughing. On arrival to bedside in ICU pt successfully intubated by anesthesia.  Assessment/Plan: 1. Acute hypoxic respiratory failure: Aspiration pneumonia w/ likely new aspiration event this evening. Continue IV Vanc/Zosyn. Discussed pt w/ Dr Oletta Darter w/ [facility] who agreed to have pt evaluated by [facility] provider tonight. Appreciate PCCM input and management while on vent.  Spoke w/ pt's significant other "[name]" who is also pt's HPOA. States pt refused to consider DNR status when discussed w/ Dr Benay Spice today stating "I'm not ready". She seems quite receptive to palliative care meeting and adds she does not want him to suffer anymore. Will continue to monitor closely in ICU.   Jeryl [place], NP-C [facility] Pager [phone]

## 2015-08-26 NOTE — Consult Note (Signed)
PULMONARY / CRITICAL CARE MEDICINE   Name: Grant [name] MRN: [id number] DOB: 1955/05/25    ADMISSION DATE:  08/29/2015 CONSULTATION DATE:  08/26/15  REFERRING MD:  Grandville Silos  CHIEF COMPLAINT:  Hypoxia  HISTORY OF PRESENT ILLNESS:  Pt is encephelopathic; therefore, this HPI is obtained from chart review. Grant [name] is a 60 y.o. male with PMH as outlined below including metastatic rectal CA, currently on salvage chemotherapy s/p cycle 5  FOLFIRI/panitumumab 08/11/15 (under the care of Dr. Learta Codding).  He was admitted 09/12/2015 with persistent diarrhea following chemo despite taking Imodium or Lomotil and also had pain in the sacrum and both legs.  He had generalized weakness and decreased oral intake along with failure to thrive.  He was admitted for supportive care and further evaluation.  On night of 08/25/15, pt had aspiration event followed by hypoxia and change in mental status.  Rapid response was called and pt was later transferred to ICU where he was intubated by anesthesia.  PCCM was called for further management.  Of note, he has been seen by Dr. Benay Spice this hospitalization who repeated staging CT 04/11 and was planning for hospice care if this showed no improvement following the FOLFIRI/panitumumab.  Code status was discussed with pt and HCPOA by Dr. Benay Spice and pt opted to continue with FULL CODE status for now.  PAST MEDICAL HISTORY :  He  has a past medical history of Fecal incontinence; Chronic kidney disease; radiation therapy (12/18/13-01/29/14); Rectal cancer ([place]) ( 10/11/13 bx); and Cancer ([place]) (10/11/13).  PAST SURGICAL HISTORY: He  has past surgical history that includes Mandible surgery (1983); Colonoscopy (N/A, 10/11/2013); Colon resection (N/A, 10/15/2013); nephrostomy tube (Right, 10/18/13); Cystoscopy with ureteroscopy (Right, 05/13/2014); and Holmium laser application (N/A, [id number]).  Allergies  Allergen Reactions  . Strawberry Extract Anaphylaxis and Hives  .  Sunflower Oil Anaphylaxis and Hives    seeds  . Watermelon [Citrullus Vulgaris] Anaphylaxis and Hives  . Gabapentin     Other reaction(s): Other (See Comments) Pt states a few months ago he was prescribed this medication, it caused him to have both auditory and visual hallucinations  . Other     All Melons cantaloupes, etc    Current Facility-Administered Medications on File Prior to Encounter  Medication  . sodium chloride 0.9 % injection 10 mL   Current Outpatient Prescriptions on File Prior to Encounter  Medication Sig  . ALPRAZolam (XANAX) 1 MG tablet TAKE 1 TABLET BY MOUTH TWICE A DAY AS NEEDED FOR ANXIETY (Patient taking differently: Take 1 mg by mouth 2 (two) times daily. )  . diphenoxylate-atropine (LOMOTIL) 2.5-0.025 MG tablet Take 1-2 tablets by mouth 4 (four) times daily as needed for diarrhea or loose stools.  . lactose free nutrition (BOOST PLUS) LIQD Take 237 mLs by mouth 4 (four) times daily -  with meals and at bedtime. From four to eight times daily.  Marland Kitchen lidocaine-prilocaine (EMLA) cream Apply 1 application topically as needed. Apply to [facility] site 1-2 hours prior to stick and cover with plastic wrap  . megestrol (MEGACE) 400 MG/10ML suspension Take 5 mLs (200 mg total) by mouth 2 (two) times daily.  Marland Kitchen morphine (MS CONTIN) 15 MG 12 hr tablet Take 1 tablet (15 mg total) by mouth every 12 (twelve) hours.  . Multiple Vitamins-Minerals ([employer] SPECTRAVITE ULTRA MENS) TABS Take 1 tablet by mouth every morning.   . ondansetron (ZOFRAN) 4 MG tablet Take 1 tablet (4 mg total) by mouth every 8 (eight) hours as needed for nausea  or vomiting.  Marland Kitchen oxyCODONE (OXY IR/ROXICODONE) 5 MG immediate release tablet Take 1-2 tablets every 4 hours as needed for pain.  Marland Kitchen prochlorperazine (COMPAZINE) 10 MG tablet Take 1 tablet (10 mg total) by mouth every 6 (six) hours as needed for nausea or vomiting.  . minocycline (MINOCIN) 100 MG capsule Take 1 capsule (100 mg total) by mouth 2 (two) times daily.  (Patient not taking: Reported on 09/10/2015)    FAMILY HISTORY:  His indicated that his mother is deceased. He indicated that his father is alive. He indicated that his maternal uncle is deceased.   SOCIAL HISTORY: He  reports that he quit smoking about 27 years ago. His smokeless tobacco use includes Snuff. He reports that he drinks alcohol. He reports that he uses illicit drugs (Marijuana).  REVIEW OF SYSTEMS:  Unable to obtain as pt is encephalopathic.  SUBJECTIVE:  On vent, unresponsive.  VITAL SIGNS: BP 140/74 mmHg  Pulse 113  Temp(Src) 97.7 F (36.5 C) (Axillary)  Resp 14  Ht 6\' 6"  (1.981 m)  Wt 62.46 kg (137 lb 11.2 oz)  BMI 15.92 kg/m2  SpO2 97%  HEMODYNAMICS:    VENTILATOR SETTINGS: Vent Mode:  [-] PRVC FiO2 (%):  [100 %] 100 % Set Rate:  [14 bmp] 14 bmp Vt Set:  [500 mL] 500 mL PEEP:  [5 cmH20] 5 cmH20 Plateau Pressure:  [20 cmH20] 20 cmH20  INTAKE / OUTPUT: I/O last 3 completed shifts: In: -  Out: [id number] [Urine:1050; [id number]   PHYSICAL EXAMINATION: General: Adult male, chronically ill appearing, in NAD. Neuro: Sedated, non-responsive. HEENT: [place]/AT. PERRL, sclerae anicteric. Cardiovascular: RRR, no M/R/G.  Lungs: Respirations even and unlabored.  CTA bilaterally, No W/R/R. Abdomen: BS x 4, soft, NT/ND.  Colostomy and urostomy in place. Musculoskeletal: No gross deformities, no edema.  Skin: Intact, warm, no rashes.  LABS:  BMET  Recent Labs Lab 08/23/15 0500 08/24/15 0545 08/25/15 0551  NA 141 138 143  K 3.6 3.2* 3.5  CL 109 110 116*  CO2 23 22 18*  BUN 19 14 12   CREATININE 1.37* 1.14 1.05  GLUCOSE 137* 115* 115*    Electrolytes  Recent Labs Lab 08/28/2015 1906  08/21/15 0500 08/22/15 0502 08/23/15 0500 08/24/15 0545 08/25/15 0551  CALCIUM  --   < > 8.9 8.7* 9.0 8.7* 8.4*  MG 1.5*  --  1.4* 2.0  --   --   --   PHOS 2.6  --   --   --   --   --   --   < > = values in this interval not displayed.  CBC  Recent Labs Lab  08/23/15 0500 08/24/15 0545 08/25/15 0551  WBC 4.8 7.3 12.0*  HGB 9.1* 8.4* 8.4*  HCT 29.3* 27.0* 27.0*  PLT 473* 355 392    Coag's No results for input(s): APTT, INR in the last 168 hours.  Sepsis Markers No results for input(s): LATICACIDVEN, PROCALCITON, O2SATVEN in the last 168 hours.  ABG No results for input(s): PHART, PCO2ART, PO2ART in the last 168 hours.  Liver Enzymes  Recent Labs Lab 09/10/2015 1438  AST 9  ALT <9  ALKPHOS 171*  BILITOT 0.88  ALBUMIN 2.9*    Cardiac Enzymes No results for input(s): TROPONINI, PROBNP in the last 168 hours.  Glucose  Recent Labs Lab 08/22/15 0723 08/23/15 0743 08/24/15 0649 08/24/15 0725 08/25/15 0602 08/25/15 0752  GLUCAP 157* 131* 99 98 96 102*    Imaging Ct Abdomen Pelvis W Wo Contrast  08/25/2015  CLINICAL DATA:  60 year old male with history of rectal cancer diagnosed in 2015, with renal metastasis diagnosed in 2016, as well as lung metastases and bone metastases. Ongoing chemotherapy. Increased colostomy output. Diffuse pain. EXAM: CT CHEST, ABDOMEN, AND PELVIS WITH CONTRAST TECHNIQUE: Multidetector CT imaging of the chest, abdomen and pelvis was performed following the standard protocol during bolus administration of intravenous contrast. CONTRAST:  70mL ISOVUE-300 IOPAMIDOL (ISOVUE-300) INJECTION 61% COMPARISON:  CT of the chest, abdomen and pelvis 06/02/2015. FINDINGS: CT CHEST FINDINGS Mediastinum/Lymph Nodes: Heart size is normal. There is no significant pericardial fluid, thickening or pericardial calcification. There is atherosclerosis of the thoracic aorta, the great vessels of the mediastinum and the coronary arteries, including calcified atherosclerotic plaque in the left anterior descending coronary artery. Mildly enlarged low right paratracheal lymph node measuring 1 cm in short axis. Enlarged low left paratracheal lymph node measuring 1 cm in short axis. No hilar lymphadenopathy. Esophagus is unremarkable in  appearance. No axillary lymphadenopathy. Lungs/Pleura: Evaluation of the lung parenchyma is limited by considerable patient respiratory motion on today's examination. Previously noted pulmonary nodules in the right middle lobe appear slightly smaller and less distinct than the prior study, largest of which measures 8 mm on today's examination (image 83 of series 8), suggesting some positive response to therapy. Previously noted nodule in the base of the left lower lobe currently measures 13 x 9 mm (image 74 of series 8) and is similar to the prior examination. Previously noted nodules in the right lung base are now obscured by considerable airspace consolidation dependently, and extensive peribronchovascular micronodularity which is present throughout all aspects of the lung bases bilaterally, but is most severe in the right lower lobe. Many of the right lower lobe bronchi in the basal segments appear completely opacified by retained secretions. Similar findings are present to a lesser degree in the left lower lobe. This patchy peribronchovascular micronodularity is present throughout all aspects of the lungs bilaterally extending into the lung apices, but does have a basal predominance where there is also some associated septal thickening and very mild septal nodularity. Trace right pleural effusion lying dependently. Musculoskeletal/Soft Tissues: There are no aggressive appearing lytic or blastic lesions noted in the visualized portions of the skeleton. CT ABDOMEN AND PELVIS FINDINGS Hepatobiliary: In the inferior aspect of segment 6 of the liver there appears to be direct invasion of the hepatic parenchyma from the patient's large right renal metastasis. This is best appreciated on coronal image 59 of series 604. No other suspicious cystic or solid hepatic lesions are noted. No intra or extrahepatic biliary ductal dilatation. Diffuse periportal edema. Gallbladder is moderately distended. However, there are no  gallstones, and no pericholecystic fluid or surrounding inflammatory changes to suggest an acute cholecystitis at this time. Pancreas: No pancreatic mass. No pancreatic ductal dilatation. No pancreatic or peripancreatic fluid or inflammatory changes. Spleen: Unremarkable. Adrenals/Urinary Tract: Bilateral adrenal glands and the left kidney are normal in appearance. Again noted are multiple infiltrative masses within the right kidney related to known metastatic disease. On today's study this currently measures approximately 13.5 x 9.0 x 13.0 cm (slightly smaller than the prior examination), and is highly invasive into adjacent structures, including the under surface of segment 6 of the liver and posteriorly into the right psoas, quadratus lumborum and right flank musculature. Status post radical cystectomy with ileal conduit. Stomach/Bowel: The appearance of the stomach is normal. There multiple borderline dilated loops of small bowel demonstrating air-fluid levels. These measure up to 3.8  cm in diameter. Status post low anterior resection. Left lower quadrant colostomy. Large amount of liquid stool in the colon. Vascular/Lymphatic: Atherosclerosis throughout the abdominal and pelvic vasculature, without evidence of aneurysm or dissection. Multiple borderline enlarged retroperitoneal lymph nodes are again noted, but there are no definite pathologically enlarged lymph nodes identified in the abdomen or pelvis. Reproductive: Status post radical prostatectomy. Other: Soft tissue thickening in the presacral space and along the right pelvic sidewall, similar to prior examinations, presumably post treatment related. Trace volume of ascites. No pneumoperitoneum. Mild diffuse mesenteric edema. Musculoskeletal: Again noted is some osteolysis and associated soft tissue thickening of the coccyx, which currently measures up to 4.4 x 4.1 cm. Whether or not this is from an osseous metastasis or is related to chronic osteomyelitis  is uncertain, but this appears to have progressed slightly compared to the prior examination. IMPRESSION: 1. Today's study demonstrates some positive response to therapy with slight decreased size of the highly infiltrative right renal mass. This mass continues to show direct invasion of segment 6 of the liver, the right psoas muscle, right quadratus lumborum and other posterior right-sided flank musculature. 2. Previously noted pulmonary nodules appear generally stable to slightly decreased compared to the prior examination, however, assessment is limited on today's study given the widespread peribronchovascular nodularity seen on today's examination. Although the appearance of today's study favors infectious/inflammatory etiologies, potentially related to significant aspiration, the possibility of developing areas of lymphangitic spread of disease are not excluded, particularly in the lung bases. 3. Borderline dilated loops of small bowel with multiple air-fluid levels. In addition, there is a large volume of liquid stool throughout the colon. Findings do not suggest obstruction, but may indicate an enteritis. 4. Enlarging area of soft tissue thickening and osteolysis in the coccyx may reflect metastatic disease, or may indicate chronic osteomyelitis. 5. New trace right pleural effusion. 6. Additional findings, as above, similar to prior examinations. Electronically Signed   By: Vinnie Langton M.D.   On: 08/25/2015 11:17   Ct Chest W Contrast  08/25/2015  CLINICAL DATA:  59 year old male with history of rectal cancer diagnosed in 2015, with renal metastasis diagnosed in 2016, as well as lung metastases and bone metastases. Ongoing chemotherapy. Increased colostomy output. Diffuse pain. EXAM: CT CHEST, ABDOMEN, AND PELVIS WITH CONTRAST TECHNIQUE: Multidetector CT imaging of the chest, abdomen and pelvis was performed following the standard protocol during bolus administration of intravenous contrast. CONTRAST:   15mL ISOVUE-300 IOPAMIDOL (ISOVUE-300) INJECTION 61% COMPARISON:  CT of the chest, abdomen and pelvis 06/02/2015. FINDINGS: CT CHEST FINDINGS Mediastinum/Lymph Nodes: Heart size is normal. There is no significant pericardial fluid, thickening or pericardial calcification. There is atherosclerosis of the thoracic aorta, the great vessels of the mediastinum and the coronary arteries, including calcified atherosclerotic plaque in the left anterior descending coronary artery. Mildly enlarged low right paratracheal lymph node measuring 1 cm in short axis. Enlarged low left paratracheal lymph node measuring 1 cm in short axis. No hilar lymphadenopathy. Esophagus is unremarkable in appearance. No axillary lymphadenopathy. Lungs/Pleura: Evaluation of the lung parenchyma is limited by considerable patient respiratory motion on today's examination. Previously noted pulmonary nodules in the right middle lobe appear slightly smaller and less distinct than the prior study, largest of which measures 8 mm on today's examination (image 83 of series 8), suggesting some positive response to therapy. Previously noted nodule in the base of the left lower lobe currently measures 13 x 9 mm (image 74 of series 8) and is similar to  the prior examination. Previously noted nodules in the right lung base are now obscured by considerable airspace consolidation dependently, and extensive peribronchovascular micronodularity which is present throughout all aspects of the lung bases bilaterally, but is most severe in the right lower lobe. Many of the right lower lobe bronchi in the basal segments appear completely opacified by retained secretions. Similar findings are present to a lesser degree in the left lower lobe. This patchy peribronchovascular micronodularity is present throughout all aspects of the lungs bilaterally extending into the lung apices, but does have a basal predominance where there is also some associated septal thickening and  very mild septal nodularity. Trace right pleural effusion lying dependently. Musculoskeletal/Soft Tissues: There are no aggressive appearing lytic or blastic lesions noted in the visualized portions of the skeleton. CT ABDOMEN AND PELVIS FINDINGS Hepatobiliary: In the inferior aspect of segment 6 of the liver there appears to be direct invasion of the hepatic parenchyma from the patient's large right renal metastasis. This is best appreciated on coronal image 59 of series 604. No other suspicious cystic or solid hepatic lesions are noted. No intra or extrahepatic biliary ductal dilatation. Diffuse periportal edema. Gallbladder is moderately distended. However, there are no gallstones, and no pericholecystic fluid or surrounding inflammatory changes to suggest an acute cholecystitis at this time. Pancreas: No pancreatic mass. No pancreatic ductal dilatation. No pancreatic or peripancreatic fluid or inflammatory changes. Spleen: Unremarkable. Adrenals/Urinary Tract: Bilateral adrenal glands and the left kidney are normal in appearance. Again noted are multiple infiltrative masses within the right kidney related to known metastatic disease. On today's study this currently measures approximately 13.5 x 9.0 x 13.0 cm (slightly smaller than the prior examination), and is highly invasive into adjacent structures, including the under surface of segment 6 of the liver and posteriorly into the right psoas, quadratus lumborum and right flank musculature. Status post radical cystectomy with ileal conduit. Stomach/Bowel: The appearance of the stomach is normal. There multiple borderline dilated loops of small bowel demonstrating air-fluid levels. These measure up to 3.8 cm in diameter. Status post low anterior resection. Left lower quadrant colostomy. Large amount of liquid stool in the colon. Vascular/Lymphatic: Atherosclerosis throughout the abdominal and pelvic vasculature, without evidence of aneurysm or dissection. Multiple  borderline enlarged retroperitoneal lymph nodes are again noted, but there are no definite pathologically enlarged lymph nodes identified in the abdomen or pelvis. Reproductive: Status post radical prostatectomy. Other: Soft tissue thickening in the presacral space and along the right pelvic sidewall, similar to prior examinations, presumably post treatment related. Trace volume of ascites. No pneumoperitoneum. Mild diffuse mesenteric edema. Musculoskeletal: Again noted is some osteolysis and associated soft tissue thickening of the coccyx, which currently measures up to 4.4 x 4.1 cm. Whether or not this is from an osseous metastasis or is related to chronic osteomyelitis is uncertain, but this appears to have progressed slightly compared to the prior examination. IMPRESSION: 1. Today's study demonstrates some positive response to therapy with slight decreased size of the highly infiltrative right renal mass. This mass continues to show direct invasion of segment 6 of the liver, the right psoas muscle, right quadratus lumborum and other posterior right-sided flank musculature. 2. Previously noted pulmonary nodules appear generally stable to slightly decreased compared to the prior examination, however, assessment is limited on today's study given the widespread peribronchovascular nodularity seen on today's examination. Although the appearance of today's study favors infectious/inflammatory etiologies, potentially related to significant aspiration, the possibility of developing areas of lymphangitic spread of  disease are not excluded, particularly in the lung bases. 3. Borderline dilated loops of small bowel with multiple air-fluid levels. In addition, there is a large volume of liquid stool throughout the colon. Findings do not suggest obstruction, but may indicate an enteritis. 4. Enlarging area of soft tissue thickening and osteolysis in the coccyx may reflect metastatic disease, or may indicate chronic  osteomyelitis. 5. New trace right pleural effusion. 6. Additional findings, as above, similar to prior examinations. Electronically Signed   By: Vinnie Langton M.D.   On: 08/25/2015 11:17   Dg Chest Port 1 View  08/25/2015  CLINICAL DATA:  Endotracheal tube placement.  Initial encounter. EXAM: PORTABLE CHEST 1 VIEW COMPARISON:  Chest radiograph performed 08/28/2015 FINDINGS: The patient's endotracheal tube is seen ending 5 cm above the carina. An enteric tube is noted coiling within the stomach. A right-sided chest port is noted ending about the cavoatrial junction. Patchy right basilar airspace opacity may reflect pneumonia or asymmetric interstitial edema. No pleural effusion or pneumothorax is seen. The cardiomediastinal silhouette is normal in size. No acute osseous abnormalities are identified. IMPRESSION: 1. Endotracheal tube seen ending 5 cm above the carina. 2. Patchy right basilar airspace opacity may reflect pneumonia or asymmetric interstitial edema. Electronically Signed   By: Garald Balding M.D.   On: 08/25/2015 23:56     STUDIES:  CT A / P / chest 04/11 > some positive response to therapy with slight decreased size of highly infiltrative renal mass.  Mass continues to show direct invasion of liver, right psoas, right quadratus lumborum, and other right sided flank musculature.  Pulmonary nodules appear generally stable.  Enlarging area of soft tissue thickening and osteolysis in the coccyx may reflect metastatic disease or may indicate chronic osteomyelitis.  New trace right pleural effusion. CXR 04/11 > patchy right basilar airspace opacity.  CULTURES: Blood 04/11 > Urine 04/06 > > 100,000 yeast.  ANTIBIOTICS: Vanc 04/11 > Zosyn 04/11 > Fluconazole 04/08 >  SIGNIFICANT EVENTS: 04/06 > admitted. 04/11 > hypoxic respiratory failure following aspiration > transferred to ICU > intubated.  LINES/TUBES: ETT 04/11 >  DISCUSSION: 60 y.o. M with hx metastatic rectal CA, s/p cycle  5 FOLFIRI / panitumumab 08/11/15 (followed by Dr. Benay Spice).  Admitted 04/06 with diarrhea and FTT.  Had aspiration event evening of 04/11 followed by AMS and hypoxic respiratory failure prompting transfer to ICU where he was intubated by anesthesia.  ASSESSMENT / PLAN:  HEMATOLOGIC / ONCOLOGIC A:   Metastatic rectal CA, s/p cycle 5 FOLFIRI / panitumumab 08/11/15 (followed by Dr. Benay Spice). Anemia - chronic. VTE Prophylaxis. P:  Oncology following, appreciate the assistance. SCD's / Lovenox. CBC in AM.  PULMONARY A: Acute hypoxic respiratory failure. Aspiration PNA. P:   Full vent support. Wean as able. VAP prevention measures. SBT in AM if able. Abx / cultures per ID section. CXR in AM.  INFECTIOUS A:   Aspiration PNA. Candiduria. P:   Abx / antifungals as above (Vanc / Zosyn / Diflucan).  Follow cultures as above. PCT algorithm not helpful in this immunocompromised host.  NEUROLOGIC A:   Acute metabolic encephalopathy. Failure to thrive - due to chemo / metastatic rectal CA. P:   Sedation:  Propofol gtt / Fentanyl PRN. RASS goal: 0 to -1. Daily WUA. D/c oxycodone, morphine, alprazolam. Palliative care consult.  RENAL A:   NAGMA - due to diarrhea. Pseudohypocalcemia. P:   HCO3 @ 75. Assess ionized calcium. BMP in AM.  GASTROINTESTINAL A:   Diarrhea -  likely secondary to chemo. GI prophylaxis. Nutrition. P:   SUP: Famotidine. NPO. Nutrition consult for TF's.  CARDIOVASCULAR A:  No acute issues. P:  No interventions required.  ENDOCRINE A:   No acute issues. P:   No interventions required.   Family updated: [name] (girlfriend who is also POA) updated at bedside.  We discussed code status again and after discussions, decided that since pt himself stated he wanted to remain FULL CODE just yesterday (08/25/15), we would honor this and revisit things in the AM.  I have recommended that [name] have further discussions with Dr. Benay Spice as well as  palliative care to discuss goals of care.  Interdisciplinary Family Meeting v Palliative Care Meeting:  Due by: Sep 15, 2015.  CC time: 40 minutes.   Montey Hora, [place] - [facility] Pager: [phone]  or [phone] 08/26/2015, 12:05 AM   PCCM Attending Note: And seen and examined with  22 assistant. Please refer to  his consultation  note which I reviewed in detail. I'm unable to review the patient's x-ray imaging due to difficulties with our software. Reportedly patient's endotracheal tube is 5 cm above the carina & there is a patchy right basilar opacity. Patient intubated after aspiration with altered mentation and hypoxia. Metastatic colorectal cancer. As of yesterday patient wished to remain full code and we will honor his wishes. Continuing broad-spectrum antibiotics with vancomycin and Zosyn. Checking tracheal aspirate. Blood cultures already obtained. Urine streptococcal & legionella antigens pending. Continuing propofol for sedation & fentanyl for pain relief.   I spent a total of 31 minutes of critical care time caring for this patient and reviewing the patient's electronic medical record.   Sonia Baller Ashok Cordia, M.D. [facility] Pul[facility] Critical [facility] Pager:  [phone] After 3pm or if no response, call [phone] 5:25 AM 08/26/2015

## 2015-08-26 NOTE — Progress Notes (Signed)
Nutrition Follow-up  DOCUMENTATION CODES:   Underweight, Severe malnutrition in context of chronic illness  INTERVENTION:   Monitor magnesium, potassium, and phosphorus daily for at least 3 days, MD to replete as needed, as pt is at risk for refeeding syndrome given severe malnutrition and poor PO intake.  -D/c Boost Plus -Initiate Vital 1.5 @ 15 ml/hr and increase by 10 ml every 8 hours to goal rate of 55 ml/hr.  30 ml Prostat daily.   Tube feeding regimen provides 2080 kcal (100% of needs), 104 grams of protein, and 1008 ml of H2O.   RD to continue to monitor  NUTRITION DIAGNOSIS:   Malnutrition related to chronic illness as evidenced by severe depletion of muscle mass, severe depletion of body fat.  Ongoing.  GOAL:   Patient will meet greater than or equal to 90% of their needs  Not meeting.  MONITOR:   Vent status, Labs, Weight trends, TF tolerance, Skin, I & O's  REASON FOR ASSESSMENT:   Consult Enteral/tube feeding initiation and management  ASSESSMENT:   Grant [name] 60 yo male With history of metastatic rectal cancer currently on salvage chemotherapy, microcytic anemia, status post colostomy and urostomy who completed another cycle of chemotherapy beginning 08/11/2015 who presents to oncologist office with ongoing diarrhea which developed on the day of chemotherapy that has persisted despite Imodium or Lomotil with pain in the sacrum region and in both legs.  Patient was eating 0% of soft diet prior to intubation d/t poor appetite. Given poor PO intake and severe malnutrition status, pt is at risk of refeeding risk. Will advance TF slowly every 8 hours.   Patient is currently intubated on ventilator support MV: 12.9 L/min Temp (24hrs), Avg:97.9 F (36.6 C), Min:97.7 F (36.5 C), Max:98.1 F (36.7 C)  Propofol: stopped for wake assessment  Medications: Megace BID Labs reviewed: CBGs: 140  Diet Order:  Diet NPO time specified  Skin:  Wound (see  comment) (medical healing wound to sacrum)  Last BM:  4/11  Height:   Ht Readings from Last 1 Encounters:  08/26/15 6\' 6"  (1.981 m)    Weight:   Wt Readings from Last 1 Encounters:  08/26/15 140 lb 3.4 oz (63.6 kg)    Ideal Body Weight:  97.27 kg  BMI:  Body mass index is 16.21 kg/(m^2).  Estimated Nutritional Needs:   Kcal:  1900-2100  Protein:  95-105g  Fluid:  >/= 2L  EDUCATION NEEDS:   No education needs identified at this time  Clayton Bibles, MS, RD, LDN Pager: [phone] After Hours Pager: [phone]

## 2015-08-26 NOTE — Progress Notes (Signed)
[place] Progress Note Patient Name: Prestin Kuperman DOB: 02-Jun-1955 MRN: [id number]   Date of Service  08/26/2015  HPI/Events of Note  ABG on 100%/PRVC 14/TV 500/P 5 = 7.09/60/117/17.  eICU Interventions  Will order: 1. Increase PRVC rate to 28. 2. Decrease 0.9 NaCl to 50 mL/hour. 3. Add NaHCO3 IV infusion at 50 mL/hour. 4. ABG at 1:30 PM.     Intervention Category Major Interventions: Acid-Base disturbance - evaluation and management;Respiratory failure - evaluation and management  Lysle Dingwall 08/26/2015, 12:27 AM

## 2015-08-27 ENCOUNTER — Ambulatory Visit: Payer: Medicaid Other

## 2015-08-27 ENCOUNTER — Inpatient Hospital Stay (HOSPITAL_COMMUNITY): Payer: Medicaid Other

## 2015-08-27 DIAGNOSIS — E872 Acidosis: Secondary | ICD-10-CM

## 2015-08-27 DIAGNOSIS — J9620 Acute and chronic respiratory failure, unspecified whether with hypoxia or hypercapnia: Secondary | ICD-10-CM

## 2015-08-27 LAB — BLOOD GAS, ARTERIAL
ACID-BASE EXCESS: 5.7 mmol/L — AB (ref 0.0–2.0)
Acid-Base Excess: 4.5 mmol/L — ABNORMAL HIGH (ref 0.0–2.0)
Bicarbonate: 29.1 mEq/L — ABNORMAL HIGH (ref 20.0–24.0)
Bicarbonate: 28 mEq/L — ABNORMAL HIGH (ref 20.0–24.0)
DELIVERY SYSTEMS: POSITIVE
DRAWN BY: 232811
DRAWN BY: 308601
Expiratory PAP: 5
FIO2: 0.3
FIO2: 0.4
Inspiratory PAP: 15
MECHVT: 500 mL
MODE: POSITIVE
O2 Saturation: 92.1 %
O2 Saturation: 97.6 %
PEEP: 5 cmH2O
PO2 ART: 61.2 mmHg — AB (ref 80.0–100.0)
Patient temperature: 98.4
Patient temperature: 98.6
RATE: 28 resp/min
TCO2: 27.3 mmol/L (ref 0–100)
TCO2: 26.3 mmol/L (ref 0–100)
pCO2 arterial: 38.2 mmHg (ref 35.0–45.0)
pCO2 arterial: 38.1 mmHg (ref 35.0–45.0)
pH, Arterial: 7.493 — ABNORMAL HIGH (ref 7.350–7.450)
pH, Arterial: 7.479 — ABNORMAL HIGH (ref 7.350–7.450)
pO2, Arterial: 94.2 mmHg (ref 80.0–100.0)

## 2015-08-27 LAB — BASIC METABOLIC PANEL
ANION GAP: 9 (ref 5–15)
BUN: 15 mg/dL (ref 6–20)
CALCIUM: 7.6 mg/dL — AB (ref 8.9–10.3)
CO2: 30 mmol/L (ref 22–32)
Chloride: 107 mmol/L (ref 101–111)
Creatinine, Ser: 1 mg/dL (ref 0.61–1.24)
GFR calc non Af Amer: >60 mL/min (ref >60)
Glucose, Bld: 128 mg/dL — ABNORMAL HIGH (ref 65–99)
Potassium: 2.7 mmol/L — CL (ref 3.5–5.1)
Sodium: 146 mmol/L — ABNORMAL HIGH (ref 135–145)

## 2015-08-27 LAB — MAGNESIUM
MAGNESIUM: 1.9 mg/dL (ref 1.7–2.4)
Magnesium: 1.3 mg/dL — ABNORMAL LOW (ref 1.7–2.4)

## 2015-08-27 LAB — GLUCOSE, CAPILLARY
GLUCOSE-CAPILLARY: 103 mg/dL — AB (ref 65–99)
GLUCOSE-CAPILLARY: 109 mg/dL — AB (ref 65–99)
GLUCOSE-CAPILLARY: 115 mg/dL — AB (ref 65–99)
GLUCOSE-CAPILLARY: 166 mg/dL — AB (ref 65–99)
Glucose-Capillary: 139 mg/dL — ABNORMAL HIGH (ref 65–99)

## 2015-08-27 LAB — CBC
HCT: 25.7 % — ABNORMAL LOW (ref 39.0–52.0)
HEMOGLOBIN: 8 g/dL — AB (ref 13.0–17.0)
MCH: 26.1 pg (ref 26.0–34.0)
MCHC: 31.1 g/dL (ref 30.0–36.0)
MCV: 83.7 fL (ref 78.0–100.0)
Platelets: 288 10*3/uL (ref 150–400)
RBC: 3.07 MIL/uL — AB (ref 4.22–5.81)
RDW: 18.9 % — ABNORMAL HIGH (ref 11.5–15.5)
WBC: 10.5 10*3/uL (ref 4.0–10.5)

## 2015-08-27 LAB — POTASSIUM: POTASSIUM: 2.7 mmol/L — AB (ref 3.5–5.1)

## 2015-08-27 LAB — PHOSPHORUS
PHOSPHORUS: 1.3 mg/dL — AB (ref 2.5–4.6)
PHOSPHORUS: 1.9 mg/dL — AB (ref 2.5–4.6)

## 2015-08-27 LAB — TRIGLYCERIDES: Triglycerides: 159 mg/dL — ABNORMAL HIGH (ref <150)

## 2015-08-27 LAB — CALCIUM, IONIZED: CALCIUM, IONIZED, SERUM: 5 mg/dL (ref 4.5–5.6)

## 2015-08-27 MED ORDER — POTASSIUM CHLORIDE 10 MEQ/50ML IV SOLN
10.0000 meq | INTRAVENOUS | Status: AC
Start: 2015-08-27 — End: 2015-08-27
  Administered 2015-08-27 (×3): 10 meq via INTRAVENOUS
  Filled 2015-08-27 (×3): qty 50

## 2015-08-27 MED ORDER — POTASSIUM PHOSPHATES 15 MMOLE/5ML IV SOLN
30.0000 mmol | Freq: Once | INTRAVENOUS | Status: AC
  Administered 2015-08-27: 30 mmol via INTRAVENOUS
  Filled 2015-08-27: qty 10

## 2015-08-27 MED ORDER — MAGNESIUM SULFATE 2 GM/50ML IV SOLN
2.0000 g | Freq: Once | INTRAVENOUS | Status: AC
  Administered 2015-08-27: 2 g via INTRAVENOUS
  Filled 2015-08-27: qty 50

## 2015-08-27 MED ORDER — MORPHINE SULFATE (PF) 2 MG/ML IV SOLN
1.0000 mg | INTRAVENOUS | Status: DC | PRN
  Administered 2015-08-27 – 2015-08-28 (×3): 2 mg via INTRAVENOUS
  Administered 2015-08-28: 4 mg via INTRAVENOUS
  Administered 2015-08-28: 2 mg via INTRAVENOUS
  Administered 2015-08-28: 4 mg via INTRAVENOUS
  Administered 2015-08-28 (×2): 2 mg via INTRAVENOUS
  Administered 2015-08-28: 4 mg via INTRAVENOUS
  Filled 2015-08-27 (×5): qty 1
  Filled 2015-08-27: qty 2
  Filled 2015-08-27: qty 1
  Filled 2015-08-27 (×2): qty 2

## 2015-08-27 MED ORDER — DEXTROSE-NACL 5-0.45 % IV SOLN
INTRAVENOUS | Status: DC
  Administered 2015-08-27 – 2015-08-29 (×3): via INTRAVENOUS

## 2015-08-27 MED ORDER — POTASSIUM CHLORIDE 10 MEQ/50ML IV SOLN
10.0000 meq | INTRAVENOUS | Status: AC
  Administered 2015-08-27 – 2015-08-28 (×6): 10 meq via INTRAVENOUS
  Filled 2015-08-27 (×6): qty 50

## 2015-08-27 MED ORDER — POTASSIUM CHLORIDE 10 MEQ/50ML IV SOLN
10.0000 meq | INTRAVENOUS | Status: DC
Start: 2015-08-27 — End: 2015-08-27

## 2015-08-27 MED ORDER — SODIUM PHOSPHATE 3 MMOLE/ML IV SOLN: 30.0000 mmol | Freq: Once | INTRAVENOUS | Status: DC

## 2015-08-27 MED ORDER — FENTANYL CITRATE (PF) 100 MCG/2ML IJ SOLN: 12.5000 ug | INTRAMUSCULAR | Status: DC | PRN

## 2015-08-27 NOTE — Progress Notes (Addendum)
[facility] Progress Note Patient Name: Grant [name] DOB: 05/26/1955 MRN: [id number]   Date of Service  08/27/2015  HPI/Events of Note  K+ = 2.7, Mg++ = 1.3, PO4--- = 1.3 and Creatinine = 1.0  eICU Interventions  Will replace K+, Mg++ and PO4---.     Intervention Category Intermediate Interventions: Electrolyte abnormality - evaluation and management  Lysle Dingwall 08/27/2015, 6:37 AM

## 2015-08-27 NOTE — Progress Notes (Signed)
PCCM Attending Note: I came to patient's bedside and had a lengthy discussion with the patient's healthcare power of attorney Tye [place]) and [place]. Patient at this time appears to have comfortable respirations on BiPAP but is pulling at his BiPAP somewhat reporting it's uncomfortable for him. He also signifies that he has pain but is unable to communicate exactly where. He will intermittently open his eyes and appears to be in no acute distress. We did discuss their wishes and his wishes regarding CODE STATUS. They wish to change him to a DO NOT RESUSCITATE/DO NOT INTUBATE status. We also discussed the need for vasopressors artificially supporting his blood pressure which they agree would not be in his best interest at this time. We did discuss refocusing our goals of care towards relief of his discomforts. To that goal I have suggested intermittent morphine IV which I explained could potentially worsen his blood pressure or his respiratory status but would provide him with some relief from his pain. They're in agreement with this course of treatment. I am replacing the patient's potassium and phosphorus IV as well given his hypokalemia and hypophosphatemia. I offered chaplain support but they declined at this time. We will continue to support the patient. I have spoken with the patient's bedside nurse to make her aware of the change in his goals of care and CODE STATUS. We have also discussed weaning the patient off of his BiPAP over to simple facemask or nasal cannula to support his oxygenation and provide him with further relief from the discomfort of his BiPAP mask.  Sonia Baller Ashok Cordia, M.D. [facility] & [facility] Pager:  [phone] After 3pm or if no response, call [phone] 9:46 PM 08/27/2015

## 2015-08-27 NOTE — Progress Notes (Signed)
Rt placed pt on  BIPAP/NIV per MD order.  

## 2015-08-27 NOTE — Progress Notes (Addendum)
PULMONARY / CRITICAL CARE MEDICINE   Name: Grant [name] MRN: [id number] DOB: [birth year]    ADMISSION DATE:  09/12/2015 CONSULTATION DATE:  08/26/15  REFERRING MD:  Grandville Silos  CHIEF COMPLAINT:  Hypoxia  HISTORY OF PRESENT ILLNESS:  Pt is encephelopathic; therefore, this HPI is obtained from chart review. Grant [name] is a 60 y.o. male with PMH as outlined below including metastatic rectal CA, currently on salvage chemotherapy s/p cycle 5  FOLFIRI/panitumumab 08/11/15 (under the care of Dr. Learta Codding).  He was admitted 08/17/2015 with persistent diarrhea following chemo despite taking Imodium or Lomotil and also had pain in the sacrum and both legs.  He had generalized weakness and decreased oral intake along with failure to thrive.  He was admitted for supportive care and further evaluation.  On night of 08/25/15, pt had aspiration event followed by hypoxia and change in mental status.  Rapid response was called and pt was later transferred to ICU where he was intubated by anesthesia.  PCCM was called for further management.  Of note, he has been seen by Dr. Benay Spice this hospitalization who repeated staging CT 04/11 and was planning for hospice care if this showed no improvement following the FOLFIRI/panitumumab.  Code status was discussed with pt and HCPOA by Dr. Benay Spice and pt opted to continue with FULL CODE status for now.  SUBJECTIVE:  Afebrile No obvious pain On vent,  More responsive.when propofol off Poor UO with urostomy  VITAL SIGNS: BP 124/82 mmHg  Pulse 88  Temp(Src) 98.3 F (36.8 C) (Oral)  Resp 17  Ht 6\' 6"  (1.981 m)  Wt 146 lb 9.7 oz (66.5 kg)  BMI 16.95 kg/m2  SpO2 100%  HEMODYNAMICS:    VENTILATOR SETTINGS: Vent Mode:  [-] PSV;CPAP FiO2 (%):  [30 %-40 %] 30 % Set Rate:  [28 bmp] 28 bmp Vt Set:  [500 mL] 500 mL PEEP:  [5 cmH20] 5 cmH20 Pressure Support:  [5 cmH20-8 cmH20] 5 cmH20 Plateau Pressure:  [13 cmH20-19 cmH20] 15 cmH20  INTAKE / OUTPUT: I/O  last 3 completed shifts: In: 8890.6 [I.V.:8065.4; NG/GT:75.3; IV Piggyback:750] Out: 2125 [Urine:850; Emesis/NG output:300; Stool:975]   PHYSICAL EXAMINATION: General: Adult male, chronically ill appearing, in NAD. Neuro: Sedated, follows commands HEENT: [place]/AT. PERRL, sclerae anicteric. Cardiovascular: RRR, no M/R/G.  Lungs: Respirations even and unlabored.  CTA bilaterally, No W/R/R. Abdomen: BS x 4, soft, NT/ND.  Colostomy and urostomy in place. Musculoskeletal: No gross deformities, no edema.  Skin: Intact, warm, no rashes.  LABS:  BMET  Recent Labs Lab 08/25/15 0551 08/26/15 0311 08/27/15 0440  NA 143 145 146*  K 3.5 3.7 2.7*  CL 116* 117* 107  CO2 18* 17* 30  BUN 12 15 15   CREATININE 1.05 1.14 1.00  GLUCOSE 115* 171* 128*    Electrolytes  Recent Labs Lab 08/31/2015 1906  08/21/15 0500 08/22/15 0502  08/25/15 0551 08/26/15 0311 08/27/15 0440  CALCIUM  --   < > 8.9 8.7*  < > 8.4* 8.6* 7.6*  MG 1.5*  --  1.4* 2.0  --   --   --  1.3*  PHOS 2.6  --   --   --   --   --   --  1.3*  < > = values in this interval not displayed.  CBC  Recent Labs Lab 08/25/15 0551 08/26/15 0311 08/27/15 0440  WBC 12.0* 16.1* 10.5  HGB 8.4* 8.5* 8.0*  HCT 27.0* 27.8* 25.7*  PLT 392 332 288    Coag's No results  for input(s): APTT, INR in the last 168 hours.  Sepsis Markers No results for input(s): LATICACIDVEN, PROCALCITON, O2SATVEN in the last 168 hours.  ABG  Recent Labs Lab 08/26/15 0010 08/26/15 0145 08/27/15 0327  PHART 7.094* 7.216* 7.493*  PCO2ART 60.6* 39.5 38.2  PO2ART 117* 91.1 61.2*    Liver Enzymes  Recent Labs Lab 09/11/2015 1438  AST 9  ALT <9  ALKPHOS 171*  BILITOT 0.88  ALBUMIN 2.9*    Cardiac Enzymes No results for input(s): TROPONINI, PROBNP in the last 168 hours.  Glucose  Recent Labs Lab 08/26/15 1221 08/26/15 1527 08/26/15 1933 08/27/15 0008 08/27/15 0400 08/27/15 0747  GLUCAP 138* 120* 133* 109* 115* 103*     Imaging Portable Chest Xray  08/27/2015  CLINICAL DATA:  Respiratory failure. EXAM: PORTABLE CHEST 1 VIEW COMPARISON:  CT 08/25/2015.  Chest x-ray 08/24/2015. FINDINGS: Endotracheal tube, NG tube, right IJ line stable position. Heart size normal. No focal infiltrate. No pleural effusion or pneumothorax. IMPRESSION: 1.  Lines and tubes in stable position. 2. Persistent mild bibasilar atelectasis and infiltrates. Reference is made to prior CT report 08/25/2015. Electronically Signed   By: Marcello Moores  Register   On: 08/27/2015 07:26     STUDIES:  CT A / P / chest 04/11 > some positive response to therapy with slight decreased size of highly infiltrative renal mass.  Mass continues to show direct invasion of liver, right psoas, right quadratus lumborum, and other right sided flank musculature.  Pulmonary nodules appear generally stable.  Enlarging area of soft tissue thickening and osteolysis in the coccyx may reflect metastatic disease or may indicate chronic osteomyelitis.  New trace right pleural effusion.   CULTURES: Blood 04/11 >ng Urine 04/06 > > 100,000 yeast.  ANTIBIOTICS: Vanc 04/11 > Zosyn 04/11 > Fluconazole 04/08 >  SIGNIFICANT EVENTS: 04/06 > admitted. 04/11 > hypoxic respiratory failure following aspiration > transferred to ICU > intubated.  LINES/TUBES: ETT 04/11 >  DISCUSSION: 60 y.o. M with hx metastatic rectal CA, s/p cycle 5 FOLFIRI / panitumumab 08/11/15 (followed by Dr. Benay Spice).  Admitted 04/06 with diarrhea and FTT.  Had aspiration event evening of 04/11 followed by AMS and hypoxic respiratory failure prompting transfer to ICU where he was intubated by anesthesia.  ASSESSMENT / PLAN:  HEMATOLOGIC / ONCOLOGIC A:   Metastatic rectal CA, s/p cycle 5 FOLFIRI / panitumumab 08/11/15 (followed by Dr. Benay Spice). Anemia - chronic. VTE Prophylaxis. P:  Oncology following, appreciate the assistance. SCD's / Lovenox. CBC in AM.  PULMONARY A: Acute hypoxic  respiratory failure. Aspiration PNA. P:   VAP prevention measures. SBTs with goal extubation now that metabolic issues resolved  INFECTIOUS A:   Aspiration PNA. Candiduria. P:   Abx / antifungals as above ( Zosyn / Diflucan).  Follow cultures as above -dc vanc. PCT algorithm not helpful in this immunocompromised host.  NEUROLOGIC A:   Acute metabolic encephalopathy. Failure to thrive - due to chemo / metastatic rectal CA. P:   Sedation:  Propofol gtt / Fentanyl PRN. RASS goal: 0 Daily WUA. D/c oxycodone, morphine, alprazolam. Palliative care following  RENAL A:   NAGMA - due to diarrhea. Pseudohypocalcemia. Hypokalemia/ hypomag/ hypophos P:   Dc HCO3 Replete lytes aggressively   GASTROINTESTINAL A:   Diarrhea - likely secondary to chemo. GI prophylaxis. Nutrition. P:   SUP: Famotidine. NPO. Ct octtreotide x 24h Nutrition consult for TF's if not extubated   CARDIOVASCULAR A:  No acute issues. P:  No interventions required.  ENDOCRINE A:  No acute issues. P:   No interventions required.   Family updated: [name] (girlfriend who is also POA) updated at bedside. 4/13  Interdisciplinary Family Meeting v Palliative Care Meeting:  Due by: 09/27/2015.  Summary - Intubated due to resp + NAG acidosis (due to diarrhea from chemo), guarded prognosis overall, would advocate for one way extubation now that acidosis improved - will have to clarify goals of care with him once extubated - kathy defers to his wishes  The patient is critically ill with multiple organ systems failure and requires high complexity decision making for assessment and support, frequent evaluation and titration of therapies, application of advanced monitoring technologies and extensive interpretation of multiple databases. Critical Care Time devoted to patient care services described in this note independent of APP time is 35 minutes.    Kara Mead MD. [place] Flood. [place] [facility]  [facility] Pager [phone] If no response call [phone]     08/27/2015, 11:09 AM

## 2015-08-27 NOTE — Progress Notes (Signed)
[place] Progress Note Patient Name: Kani Macko DOB: 1955-08-22 MRN: [id number]   Date of Service  08/27/2015  HPI/Events of Note  Pt remains drowsy, on and off pulling his bipap (which is more than what he was doing 2 hrs ago). Protecting his airway. Not in distress. 94/70, 90, 20. 100% on BiPaP 40%. ABG 7.48/38/94.   eICU Interventions  Will continue to observe.  Check electrolytes to optimize pt.  Girlfriend updated -- remains a full code.      Intervention Category Intermediate Interventions: Respiratory distress - evaluation and management  Rush Landmark 08/27/2015, 8:29 PM

## 2015-08-27 NOTE — Procedures (Signed)
Extubation Procedure Note  Patient Details:   Name: Grant [name] DOB: [birth year] MRN: [id number]   Airway Documentation:     Evaluation  O2 sats: stable throughout Complications: No apparent complications Patient did tolerate procedure well. Bilateral Breath Sounds: Diminished   Yes  Johnette Abraham 08/27/2015, 11:58 AM

## 2015-08-27 NOTE — Progress Notes (Signed)
PROGRESS NOTE  Grant [name] WOE:[id number] DOB: March 07, 1956 DOA: 08/25/2015 PCP: No primary care provider on file.  Hospital Summary 60 y.o. male with history of metastatic rectal cancer currently on salvage chemotherapy, microcytic anemia, status post colostomy and urostomy who completed another cycle of chemotherapy (FOLFIRI/panitumumab) on 08/11/2015 who presents to oncologist office with ongoing diarrhea which developed on the day of chemotherapy that has persisted despite Imodium or Lomotil with pain in the sacrum region and in both legs. Patient noted to be significantly weak with decreased oral intake however drinking fluids. He received Neulasta 08/13/2015.  The patient was noted to have yeast in his urine with significant pyuria. He was started on fluconazole. C. Difficile assay and GI pathogen panel has been negative. The patient was started on octreotide 08/22/15 by Dr. Benay Spice in hopes to slow his diarrhea.  He was continued on imodium and lomotil.  He was continued on MS Contin,oxycodone,and alprazolam.  He was on intravenous morphine for break through pain.  On the evening of 08/25/2015, the patient developed respiratory distress with oxygen saturation in the 50s. The patient was intubated. It was felt that the patient likely had an aspiration event followed by hypoxia and change in his mental status. It was thought that his opioids in conjunction with his non-anion gapped metabolic acidosis (NAG) may have contributed to his clinical decline.  As result, his alprazolam, morphine, and oxycodone were discontinued. Critical care medicine assumed care from that point forward. 08/25/2015 CT of the chest to suggest a possible aspiration. Due to his respiratory failure and CT findings, the patient was started initially on Zosyn and vancomycin.  This was narrowed to Zosyn.  The patient was subsequently extubated on 08/27/2015, and his care was transferred to [facility] service on  08/28/15. Assessment/Plan: Acute respiratory failure with hypoxia -secondary to aspiration pneumonitis in the setting of opioid use and NAG -continue Zosyn day #4-->augmentin -Wean oxygen as tolerated -Pulmonary hygiene -LIMIT SEDATION IF POSSIBLE  Metastatic rectal CA, s/p cycle 5 FOLFIRI / panitumumab 08/11/15 -followed by Dr. Benay Spice -4/11/2017CT abdomen and pelvis showed enlarging soft tissue  thickened area with osteolysis in the coccyx -here was also borderline dilated loops of bowel with air-fluid levels concerning for possible enteritis. There was large volume stool in the colon. -abdomen also shows direct invasion of the liver, right psoas muscle, right quadratus lumborum and other posterior right-sided flank musculature. Pulmonary nodules appear stable however widespread  -patient remains full code--prognosis poor -CT of the chest with concerns of possible aspiration pneumonia versus lymphangitic spread  Failure to thrive/deconditioning/severe malnutrition -Nutritional supplementation once able to take oral intake  Diarrhea -Suspect  Enteritis secondary to chemotherapy -octreotide helped-->d/c by PCCM on 4/14 (4/19-->4/14) -monitor off octreotide  Candida UTI -Continue fluconazole (4/8-->4/14)  Dehydration/AKI -secondary to GI losses -continue IVF -serum creatinine improved  ulcer of the sacral region -Minimize opioids and hypnotics -Continue wound care. -present at time of admission -unstageable per WOCN  History of right hydronephrosis/right renal abscess/status post placement of right ureteral stent  Stable. Good urine output. Follow.    Family Communication:   Significant other updated at beside 4/14--total time 60 min; >50% spent counseling and coordinating care Disposition Plan:   NOT stable for d.c   Consultants:  Oncology: Dr. Benay Spice 09/09/2015  PCCM  Palliative  Antibiotics: Vanc 04/11 >4/13 Zosyn 04/11 >4/14 Fluconazole 04/08  >4/14 Augmentin 4/14>>>    Procedures/Studies: Ct Abdomen Pelvis W Wo Contrast  08/25/2015  CLINICAL DATA:  60 year old male with history of rectal cancer diagnosed in 2015, with renal metastasis diagnosed in 2016, as well as lung metastases and bone metastases. Ongoing chemotherapy. Increased colostomy output. Diffuse pain. EXAM: CT CHEST, ABDOMEN, AND PELVIS WITH CONTRAST TECHNIQUE: Multidetector CT imaging of the chest, abdomen and pelvis was performed following the standard protocol during bolus administration of intravenous contrast. CONTRAST:  88m ISOVUE-300 IOPAMIDOL (ISOVUE-300) INJECTION 61% COMPARISON:  CT of the chest, abdomen and pelvis 06/02/2015. FINDINGS: CT CHEST FINDINGS Mediastinum/Lymph Nodes: Heart size is normal. There is no significant pericardial fluid, thickening or pericardial calcification. There is atherosclerosis of the thoracic aorta, the great vessels of the mediastinum and the coronary arteries, including calcified atherosclerotic plaque in the left anterior descending coronary artery. Mildly enlarged low right paratracheal lymph node measuring 1 cm in short axis. Enlarged low left paratracheal lymph node measuring 1 cm in short axis. No hilar lymphadenopathy. Esophagus is unremarkable in appearance. No axillary lymphadenopathy. Lungs/Pleura: Evaluation of the lung parenchyma is limited by considerable patient respiratory motion on today's examination. Previously noted pulmonary nodules in the right middle lobe appear slightly smaller and less distinct than the prior study, largest of which measures 8 mm on today's examination (image 83 of series 8), suggesting some positive response to therapy. Previously noted nodule in the base of the left lower lobe currently measures 13 x 9 mm (image 74 of series 8) and is similar to the prior examination. Previously noted nodules in the right lung base are now obscured by considerable airspace consolidation dependently, and extensive  peribronchovascular micronodularity which is present throughout all aspects of the lung bases bilaterally, but is most severe in the right lower lobe. Many of the right lower lobe bronchi in the basal segments appear completely opacified by retained secretions. Similar findings are present to a lesser degree in the left lower lobe. This patchy peribronchovascular micronodularity is present throughout all aspects of the lungs bilaterally extending into the lung apices, but does have a basal predominance where there is also some associated septal thickening and very mild septal nodularity. Trace right pleural effusion lying dependently. Musculoskeletal/Soft Tissues: There are no aggressive appearing lytic or blastic lesions noted in the visualized portions of the skeleton. CT ABDOMEN AND PELVIS FINDINGS Hepatobiliary: In the inferior aspect of segment 6 of the liver there appears to be direct invasion of the hepatic parenchyma from the patient's large right renal metastasis. This is best appreciated on coronal image 59 of series 604. No other suspicious cystic or solid hepatic lesions are noted. No intra or extrahepatic biliary ductal dilatation. Diffuse periportal edema. Gallbladder is moderately distended. However, there are no gallstones, and no pericholecystic fluid or surrounding inflammatory changes to suggest an acute cholecystitis at this time. Pancreas: No pancreatic mass. No pancreatic ductal dilatation. No pancreatic or peripancreatic fluid or inflammatory changes. Spleen: Unremarkable. Adrenals/Urinary Tract: Bilateral adrenal glands and the left kidney are normal in appearance. Again noted are multiple infiltrative masses within the right kidney related to known metastatic disease. On today's study this currently measures approximately 13.5 x 9.0 x 13.0 cm (slightly smaller than the prior examination), and is highly invasive into adjacent structures, including the under surface of segment 6 of the liver  and posteriorly into the right psoas, quadratus lumborum and right flank musculature. Status post radical cystectomy with ileal conduit. Stomach/Bowel: The appearance of the stomach is normal. There multiple borderline dilated loops of small bowel demonstrating air-fluid levels. These measure up to 3.8 cm in  diameter. Status post low anterior resection. Left lower quadrant colostomy. Large amount of liquid stool in the colon. Vascular/Lymphatic: Atherosclerosis throughout the abdominal and pelvic vasculature, without evidence of aneurysm or dissection. Multiple borderline enlarged retroperitoneal lymph nodes are again noted, but there are no definite pathologically enlarged lymph nodes identified in the abdomen or pelvis. Reproductive: Status post radical prostatectomy. Other: Soft tissue thickening in the presacral space and along the right pelvic sidewall, similar to prior examinations, presumably post treatment related. Trace volume of ascites. No pneumoperitoneum. Mild diffuse mesenteric edema. Musculoskeletal: Again noted is some osteolysis and associated soft tissue thickening of the coccyx, which currently measures up to 4.4 x 4.1 cm. Whether or not this is from an osseous metastasis or is related to chronic osteomyelitis is uncertain, but this appears to have progressed slightly compared to the prior examination. IMPRESSION: 1. Today's study demonstrates some positive response to therapy with slight decreased size of the highly infiltrative right renal mass. This mass continues to show direct invasion of segment 6 of the liver, the right psoas muscle, right quadratus lumborum and other posterior right-sided flank musculature. 2. Previously noted pulmonary nodules appear generally stable to slightly decreased compared to the prior examination, however, assessment is limited on today's study given the widespread peribronchovascular nodularity seen on today's examination. Although the appearance of today's  study favors infectious/inflammatory etiologies, potentially related to significant aspiration, the possibility of developing areas of lymphangitic spread of disease are not excluded, particularly in the lung bases. 3. Borderline dilated loops of small bowel with multiple air-fluid levels. In addition, there is a large volume of liquid stool throughout the colon. Findings do not suggest obstruction, but may indicate an enteritis. 4. Enlarging area of soft tissue thickening and osteolysis in the coccyx may reflect metastatic disease, or may indicate chronic osteomyelitis. 5. New trace right pleural effusion. 6. Additional findings, as above, similar to prior examinations. Electronically Signed   By: Vinnie Langton M.D.   On: 08/25/2015 11:17   X-ray Chest Pa And Lateral  08/22/2015  CLINICAL DATA:  Shortness of breath.  Rectal cancer EXAM: CHEST  2 VIEW COMPARISON:  06/02/2015 FINDINGS: There is a a right chest wall port a catheter with tip in the projection of the SVC. The heart size appears normal. No pleural effusion or edema. Bilateral nodular densities are again noted compatible with lung metastases. IMPRESSION: 1. No acute cardiopulmonary abnormalities. 2. Pulmonary metastasis. Electronically Signed   By: Kerby Moors M.D.   On: 08/27/2015 19:58   Ct Chest W Contrast  08/25/2015  CLINICAL DATA:  60 year old male with history of rectal cancer diagnosed in 2015, with renal metastasis diagnosed in 2016, as well as lung metastases and bone metastases. Ongoing chemotherapy. Increased colostomy output. Diffuse pain. EXAM: CT CHEST, ABDOMEN, AND PELVIS WITH CONTRAST TECHNIQUE: Multidetector CT imaging of the chest, abdomen and pelvis was performed following the standard protocol during bolus administration of intravenous contrast. CONTRAST:  39m ISOVUE-300 IOPAMIDOL (ISOVUE-300) INJECTION 61% COMPARISON:  CT of the chest, abdomen and pelvis 06/02/2015. FINDINGS: CT CHEST FINDINGS Mediastinum/Lymph Nodes:  Heart size is normal. There is no significant pericardial fluid, thickening or pericardial calcification. There is atherosclerosis of the thoracic aorta, the great vessels of the mediastinum and the coronary arteries, including calcified atherosclerotic plaque in the left anterior descending coronary artery. Mildly enlarged low right paratracheal lymph node measuring 1 cm in short axis. Enlarged low left paratracheal lymph node measuring 1 cm in short axis. No hilar lymphadenopathy. Esophagus is  unremarkable in appearance. No axillary lymphadenopathy. Lungs/Pleura: Evaluation of the lung parenchyma is limited by considerable patient respiratory motion on today's examination. Previously noted pulmonary nodules in the right middle lobe appear slightly smaller and less distinct than the prior study, largest of which measures 8 mm on today's examination (image 83 of series 8), suggesting some positive response to therapy. Previously noted nodule in the base of the left lower lobe currently measures 13 x 9 mm (image 74 of series 8) and is similar to the prior examination. Previously noted nodules in the right lung base are now obscured by considerable airspace consolidation dependently, and extensive peribronchovascular micronodularity which is present throughout all aspects of the lung bases bilaterally, but is most severe in the right lower lobe. Many of the right lower lobe bronchi in the basal segments appear completely opacified by retained secretions. Similar findings are present to a lesser degree in the left lower lobe. This patchy peribronchovascular micronodularity is present throughout all aspects of the lungs bilaterally extending into the lung apices, but does have a basal predominance where there is also some associated septal thickening and very mild septal nodularity. Trace right pleural effusion lying dependently. Musculoskeletal/Soft Tissues: There are no aggressive appearing lytic or blastic lesions  noted in the visualized portions of the skeleton. CT ABDOMEN AND PELVIS FINDINGS Hepatobiliary: In the inferior aspect of segment 6 of the liver there appears to be direct invasion of the hepatic parenchyma from the patient's large right renal metastasis. This is best appreciated on coronal image 59 of series 604. No other suspicious cystic or solid hepatic lesions are noted. No intra or extrahepatic biliary ductal dilatation. Diffuse periportal edema. Gallbladder is moderately distended. However, there are no gallstones, and no pericholecystic fluid or surrounding inflammatory changes to suggest an acute cholecystitis at this time. Pancreas: No pancreatic mass. No pancreatic ductal dilatation. No pancreatic or peripancreatic fluid or inflammatory changes. Spleen: Unremarkable. Adrenals/Urinary Tract: Bilateral adrenal glands and the left kidney are normal in appearance. Again noted are multiple infiltrative masses within the right kidney related to known metastatic disease. On today's study this currently measures approximately 13.5 x 9.0 x 13.0 cm (slightly smaller than the prior examination), and is highly invasive into adjacent structures, including the under surface of segment 6 of the liver and posteriorly into the right psoas, quadratus lumborum and right flank musculature. Status post radical cystectomy with ileal conduit. Stomach/Bowel: The appearance of the stomach is normal. There multiple borderline dilated loops of small bowel demonstrating air-fluid levels. These measure up to 3.8 cm in diameter. Status post low anterior resection. Left lower quadrant colostomy. Large amount of liquid stool in the colon. Vascular/Lymphatic: Atherosclerosis throughout the abdominal and pelvic vasculature, without evidence of aneurysm or dissection. Multiple borderline enlarged retroperitoneal lymph nodes are again noted, but there are no definite pathologically enlarged lymph nodes identified in the abdomen or pelvis.  Reproductive: Status post radical prostatectomy. Other: Soft tissue thickening in the presacral space and along the right pelvic sidewall, similar to prior examinations, presumably post treatment related. Trace volume of ascites. No pneumoperitoneum. Mild diffuse mesenteric edema. Musculoskeletal: Again noted is some osteolysis and associated soft tissue thickening of the coccyx, which currently measures up to 4.4 x 4.1 cm. Whether or not this is from an osseous metastasis or is related to chronic osteomyelitis is uncertain, but this appears to have progressed slightly compared to the prior examination. IMPRESSION: 1. Today's study demonstrates some positive response to therapy with slight decreased size of  the highly infiltrative right renal mass. This mass continues to show direct invasion of segment 6 of the liver, the right psoas muscle, right quadratus lumborum and other posterior right-sided flank musculature. 2. Previously noted pulmonary nodules appear generally stable to slightly decreased compared to the prior examination, however, assessment is limited on today's study given the widespread peribronchovascular nodularity seen on today's examination. Although the appearance of today's study favors infectious/inflammatory etiologies, potentially related to significant aspiration, the possibility of developing areas of lymphangitic spread of disease are not excluded, particularly in the lung bases. 3. Borderline dilated loops of small bowel with multiple air-fluid levels. In addition, there is a large volume of liquid stool throughout the colon. Findings do not suggest obstruction, but may indicate an enteritis. 4. Enlarging area of soft tissue thickening and osteolysis in the coccyx may reflect metastatic disease, or may indicate chronic osteomyelitis. 5. New trace right pleural effusion. 6. Additional findings, as above, similar to prior examinations. Electronically Signed   By: Vinnie Langton M.D.   On:  08/25/2015 11:17   Portable Chest Xray  08/27/2015  CLINICAL DATA:  Respiratory failure. EXAM: PORTABLE CHEST 1 VIEW COMPARISON:  CT 08/25/2015.  Chest x-ray 09/13/2015. FINDINGS: Endotracheal tube, NG tube, right IJ line stable position. Heart size normal. No focal infiltrate. No pleural effusion or pneumothorax. IMPRESSION: 1.  Lines and tubes in stable position. 2. Persistent mild bibasilar atelectasis and infiltrates. Reference is made to prior CT report 08/25/2015. Electronically Signed   By: Marcello Moores  Register   On: 08/27/2015 07:26   Dg Chest Port 1 View  08/25/2015  CLINICAL DATA:  Endotracheal tube placement.  Initial encounter. EXAM: PORTABLE CHEST 1 VIEW COMPARISON:  Chest radiograph performed 09/12/2015 FINDINGS: The patient's endotracheal tube is seen ending 5 cm above the carina. An enteric tube is noted coiling within the stomach. A right-sided chest port is noted ending about the cavoatrial junction. Patchy right basilar airspace opacity may reflect pneumonia or asymmetric interstitial edema. No pleural effusion or pneumothorax is seen. The cardiomediastinal silhouette is normal in size. No acute osseous abnormalities are identified. IMPRESSION: 1. Endotracheal tube seen ending 5 cm above the carina. 2. Patchy right basilar airspace opacity may reflect pneumonia or asymmetric interstitial edema. Electronically Signed   By: Garald Balding M.D.   On: 08/25/2015 23:56         Subjective:   Objective: Filed Vitals:   08/27/15 1500 08/27/15 1600 08/27/15 1700 08/27/15 1800  BP: 119/78 127/75 128/71 104/71  Pulse: 92 87 82 75  Temp:  98.3 F (36.8 C)    TempSrc:      Resp: 16 18 15 20   Height:      Weight:      SpO2: 100% 100% 100% 100%    Intake/Output Summary (Last 24 hours) at 08/27/15 1833 Last data filed at 08/27/15 1800  Gross per 24 hour  Intake 2903.35 ml  Output   2400 ml  Net 503.35 ml   Weight change: 3.2 kg (7 lb 0.9 oz) Exam:   General:  Pt is alert,  follows commands appropriately, not in acute distress  HEENT: No icterus, No thrush, No neck mass, [place]/AT  Cardiovascular: RRR, S1/S2, no rubs, no gallops  Respiratory: Bilateral scattered rhonchi.  Abdomen: Soft/+BS, non tender, non distended, no guarding; no hepatosplenomegaly  Extremities: No edema, No lymphangitis, No petechiae, No rashes, no synovitis  Data Reviewed: Basic Metabolic Panel:  Recent Labs Lab 08/28/2015 1906  08/21/15 0500 08/22/15 0502 08/23/15 0500  08/24/15 0545 08/25/15 0551 08/26/15 0311 08/27/15 0440  NA  --   < > 139 135 141 138 143 145 146*  K  --   < > 3.6 3.3* 3.6 3.2* 3.5 3.7 2.7*  CL  --   < > 105 100* 109 110 116* 117* 107  CO2  --   < > 23 22 23 22  18* 17* 30  GLUCOSE  --   < > 105* 180* 137* 115* 115* 171* 128*  BUN  --   < > 16 12 19 14 12 15 15   CREATININE  --   < > 1.15 1.19 1.37* 1.14 1.05 1.14 1.00  CALCIUM  --   < > 8.9 8.7* 9.0 8.7* 8.4* 8.6* 7.6*  MG 1.5*  --  1.4* 2.0  --   --   --   --  1.3*  PHOS 2.6  --   --   --   --   --   --   --  1.3*  < > = values in this interval not displayed. Liver Function Tests: No results for input(s): AST, ALT, ALKPHOS, BILITOT, PROT, ALBUMIN in the last 168 hours. No results for input(s): LIPASE, AMYLASE in the last 168 hours. No results for input(s): AMMONIA in the last 168 hours. CBC:  Recent Labs Lab 08/22/15 0502 08/23/15 0500 08/24/15 0545 08/25/15 0551 08/26/15 0311 08/27/15 0440  WBC 2.2* 4.8 7.3 12.0* 16.1* 10.5  NEUTROABS 1.7  --   --   --   --   --   HGB 9.6* 9.1* 8.4* 8.4* 8.5* 8.0*  HCT 29.7* 29.3* 27.0* 27.0* 27.8* 25.7*  MCV 82.3 84.0 83.9 84.4 84.8 83.7  PLT 378 473* 355 392 332 288   Cardiac Enzymes: No results for input(s): CKTOTAL, CKMB, CKMBINDEX, TROPONINI in the last 168 hours. BNP: Invalid input(s): POCBNP CBG:  Recent Labs Lab 08/27/15 0008 08/27/15 0400 08/27/15 0747 08/27/15 1117 08/27/15 1554  GLUCAP 109* 115* 103* 166* 139*    Recent Results (from  the past 240 hour(s))  Culture, Urine     Status: Abnormal   Collection Time: 08/27/2015  6:00 PM  Result Value Ref Range Status   Specimen Description URINE, RANDOM  Final   Special Requests NONE  Final   Culture >=100,000 COLONIES/mL YEAST (A)  Final   Report Status 08/21/2015 FINAL  Final  C difficile quick scan w PCR reflex     Status: None   Collection Time: 09/02/2015  6:00 PM  Result Value Ref Range Status   C Diff antigen NEGATIVE NEGATIVE Final   C Diff toxin NEGATIVE NEGATIVE Final   C Diff interpretation Negative for toxigenic C. difficile  Final  Gastrointestinal Panel by PCR , Stool     Status: None   Collection Time: 09/07/2015  6:00 PM  Result Value Ref Range Status   Campylobacter species NOT DETECTED NOT DETECTED Final   Plesimonas shigelloides NOT DETECTED NOT DETECTED Final   Salmonella species NOT DETECTED NOT DETECTED Final   Yersinia enterocolitica NOT DETECTED NOT DETECTED Final   Vibrio species NOT DETECTED NOT DETECTED Final   Vibrio cholerae NOT DETECTED NOT DETECTED Final   Enteroaggregative E coli (EAEC) NOT DETECTED NOT DETECTED Final   Enteropathogenic E coli (EPEC) NOT DETECTED NOT DETECTED Final   Enterotoxigenic E coli (ETEC) NOT DETECTED NOT DETECTED Final   Shiga like toxin producing E coli (STEC) NOT DETECTED NOT DETECTED Final   E. coli O157 NOT DETECTED NOT DETECTED  Final   Shigella/Enteroinvasive E coli (EIEC) NOT DETECTED NOT DETECTED Final   Cryptosporidium NOT DETECTED NOT DETECTED Final   Cyclospora cayetanensis NOT DETECTED NOT DETECTED Final   Entamoeba histolytica NOT DETECTED NOT DETECTED Final   Giardia lamblia NOT DETECTED NOT DETECTED Final   Adenovirus F40/41 NOT DETECTED NOT DETECTED Final   Astrovirus NOT DETECTED NOT DETECTED Final   Norovirus GI/GII NOT DETECTED NOT DETECTED Final   Rotavirus A NOT DETECTED NOT DETECTED Final   Sapovirus (I, II, IV, and V) NOT DETECTED NOT DETECTED Final  Culture, blood (Routine X 2) w Reflex to  ID Panel     Status: None   Collection Time: 09/09/2015  7:06 PM  Result Value Ref Range Status   Specimen Description BLOOD RIGHT ARM  Final   Special Requests BOTTLES DRAWN AEROBIC AND ANAEROBIC 10CC EA  Final   Culture   Final    NO GROWTH 5 DAYS Performed at [facility]    Report Status 08/25/2015 FINAL  Final  Culture, blood (Routine X 2) w Reflex to ID Panel     Status: None   Collection Time: 08/19/2015  7:06 PM  Result Value Ref Range Status   Specimen Description BLOOD LEFT ARM  Final   Special Requests BOTTLES DRAWN AEROBIC AND ANAEROBIC [place] EA  Final   Culture   Final    NO GROWTH 5 DAYS Performed at [facility]    Report Status 08/25/2015 FINAL  Final  Culture, blood (routine x 2) Call MD if unable to obtain prior to antibiotics being given     Status: None (Preliminary result)   Collection Time: 08/25/15  5:42 PM  Result Value Ref Range Status   Specimen Description BLOOD LEFT WRIST  Final   Special Requests BOTTLES DRAWN AEROBIC AND ANAEROBIC 5CC  Final   Culture   Final    NO GROWTH 2 DAYS Performed at [facility]    Report Status PENDING  Incomplete  Culture, blood (routine x 2) Call MD if unable to obtain prior to antibiotics being given     Status: None (Preliminary result)   Collection Time: 08/25/15  5:47 PM  Result Value Ref Range Status   Specimen Description BLOOD RIGHT ARM  Final   Special Requests BOTTLES DRAWN AEROBIC AND ANAEROBIC 6.5CC  Final   Culture   Final    NO GROWTH 2 DAYS Performed at [facility]    Report Status PENDING  Incomplete  MRSA PCR Screening     Status: None   Collection Time: 08/26/15 12:30 AM  Result Value Ref Range Status   MRSA by PCR NEGATIVE NEGATIVE Final    Comment:        The GeneXpert MRSA Assay (FDA approved for NASAL specimens only), is one component of a comprehensive MRSA colonization surveillance program. It is not intended to diagnose MRSA infection nor to guide or monitor  treatment for MRSA infections.   Culture, respiratory (NON-Expectorated)     Status: None (Preliminary result)   Collection Time: 08/26/15  8:05 AM  Result Value Ref Range Status   Specimen Description TRACHEAL ASPIRATE  Final   Special Requests Immunocompromised  Final   Gram Stain   Final    ABUNDANT WBC PRESENT,BOTH PMN AND MONONUCLEAR FEW SQUAMOUS EPITHELIAL CELLS PRESENT MODERATE YEAST RARE GRAM POSITIVE RODS Performed at [facility]    Culture PENDING  Incomplete   Report Status PENDING  Incomplete  Scheduled Meds: . antiseptic oral rinse  7 mL Mouth Rinse QID  . chlorhexidine gluconate (SAGE KIT)  15 mL Mouth Rinse BID  . collagenase   Topical Daily  . enoxaparin (LOVENOX) injection  40 mg Subcutaneous QHS  . famotidine (PEPCID) IV  20 mg Intravenous Q24H  . feeding supplement (PRO-STAT SUGAR FREE 64)  30 mL Per Tube Daily  . fluconazole (DIFLUCAN) IV  200 mg Intravenous Q24H  . octreotide  100 mcg Subcutaneous TID  . piperacillin-tazobactam (ZOSYN)  IV  3.375 g Intravenous Q8H  . saccharomyces boulardii  250 mg Oral BID   Continuous Infusions: . dextrose 5 % and 0.45% NaCl 50 mL/hr at 08/27/15 1800  . feeding supplement (VITAL 1.5 CAL) Stopped (08/27/15 1100)     Jaymeson Mengel, DO  [facility] Pager [phone]  If 7PM-7AM, please contact night-coverage [email or web address] Password [facility] 08/27/2015, 6:33 PM   LOS: 7 days

## 2015-08-27 NOTE — Progress Notes (Signed)
Pharmacy Antibiotic Note  Grant [name] is a 60 y.o. male admitted on 08/15/2015 with failure to thrive, diarrhea.  PMH includes metastatic rectal cancer, undergoing chemotherapy. Pharmacy has been consulted for Vancomycin & Zosyn dosing for suspected aspiration pneumonia after aspiration event and hypoxia leading to intubation.  Plan:  Continue Zosyn 3.375g IV Q8H infused over 4hrs.  Vancomycin 1gm IV x 1 dose then 750mg  IV q12 hours  Measure Vanc trough at steady state.  Goal trough 15-20 mcg/mL.  Follow up renal fxn, culture results, and clinical course.  Height: 6\' 6"  (198.1 cm) Weight: 146 lb 9.7 oz (66.5 kg) IBW/kg (Calculated) : 91.4  Temp (24hrs), Avg:98 F (36.7 C), Min:97.6 F (36.4 C), Max:98.3 F (36.8 C)   Recent Labs Lab 08/23/15 0500 08/24/15 0545 08/25/15 0551 08/26/15 0311 08/27/15 0440  WBC 4.8 7.3 12.0* 16.1* 10.5  CREATININE 1.37* 1.14 1.05 1.14 1.00    Estimated Creatinine Clearance: 75.7 mL/min (by C-G formula based on Cr of 1).    Allergies  Allergen Reactions  . Strawberry Extract Anaphylaxis and Hives  . Sunflower Oil Anaphylaxis and Hives    seeds  . Watermelon [Citrullus Vulgaris] Anaphylaxis and Hives  . Gabapentin     Other reaction(s): Other (See Comments) Pt states a few months ago he was prescribed this medication, it caused him to have both auditory and visual hallucinations  . Other     All Melons cantaloupes, etc    Antimicrobials this admission: 4/7 Ceftriaxone >> 4/8 4/8 Fluconazole >> 4/11 Vanc >> 4/11 Zosyn >>  Dose adjustments this admission: 4/13 1700 VT = ___  Microbiology results: 4/6 Cdiff: neg toxin, neg Ag 4/6 GI panel: none detected 4/6 BCx: NGF 4/6 UCx: >100 K colonies yeast (final) 4/11 BCx: ngtd 4/12 Sputum Cx: pending 4/12 MRSA PCR: negative  Thank you for allowing pharmacy to be a part of this patient's care.  Gretta Arab PharmD, BCPS Pager [phone] 08/27/2015 8:42 AM

## 2015-08-27 NOTE — Progress Notes (Signed)
Date:  August 27, 2015 Chart reviewed for concurrent status and case management needs. Will continue to follow patient for changes and needs: remains on full vent support Velva Harman, BSN, Therapist, sports, [place]   [phone]

## 2015-08-27 NOTE — Progress Notes (Signed)
[place] Progress Note Patient Name: Grant [name] DOB: 1956/01/29 MRN: [id number]   Date of Service  08/27/2015  HPI/Events of Note  RN calls as pt has gotten drowsier last 1 hr. On BiPaP. Sedated.  102/64. 80, 18, 100% on Bipap peep 5, PS 10, rate 15, 40%  eICU Interventions   Plan for stat abg. Will intubate depending on abg.      Intervention Category Major Interventions: Hypoxemia - evaluation and management  Rush Landmark 08/27/2015, 7:53 PM

## 2015-08-27 NOTE — Progress Notes (Signed)
[facility] ADULT ICU REPLACEMENT PROTOCOL FOR AM LAB REPLACEMENT ONLY  The patient does not apply for the [facility] Adult ICU Electrolyte Replacment Protocol based on the criteria listed below:     Is urine output >/= 0.5 ml/kg/hr for the last 6 hours? No. Patient's UOP is 0 ml/kg/hr   Abnormal electrolyte(s):K2.7,phos 1.3, mg 1.3   If a panic level lab has been reported, has the CCM MD in charge been notified? Yes.   Physician:  Levada Schilling, MD  Vear Clock 08/27/2015 6:24 AM

## 2015-08-27 NOTE — Evaluation (Addendum)
SLP Cancellation Note  Patient Details Name: Grant [name] MRN: [id number] DOB: [birth year]   Cancelled treatment:       Reason Eval/Treat Not Completed: Other (comment) (received order for bse to start 4/14, will complete as ordered)    SLP spoke to RN Grant [name] to confirm pt appropriate to wait until tomorrow for eval.   Grant [name], [facility] SLP [phone]

## 2015-08-27 NOTE — Progress Notes (Signed)
Nutrition Follow-up  DOCUMENTATION CODES:   Underweight, Severe malnutrition in context of chronic illness  INTERVENTION:  -RD continue to monitor -If unable to extubate, resume vital 1.5 @ 19mL/hr, Pro-Stat 34mL Q24H  NUTRITION DIAGNOSIS:   Malnutrition related to chronic illness as evidenced by severe depletion of muscle mass, severe depletion of body fat. ongoing  GOAL:   Patient will meet greater than or equal to 90% of their needs prgoressing  MONITOR:   Vent status, Labs, Weight trends, TF tolerance, Skin, I & O's  REASON FOR ASSESSMENT:   Consult Enteral/tube feeding initiation and management  ASSESSMENT:   Grant [name] 60 yo male With history of metastatic rectal cancer currently on salvage chemotherapy, microcytic anemia, status post colostomy and urostomy who completed another cycle of chemotherapy beginning 08/11/2015 who presents to oncologist office with ongoing diarrhea which developed on the day of chemotherapy that has persisted despite Imodium or Lomotil with pain in the sacrum region and in both legs.  Patient is currently intubated on ventilator support MV: 9.3 L/min Temp (24hrs), Avg:98 F (36.7 C), Min:97.6 F (36.4 C), Max:98.3 F (36.8 C)  Propofol: none  When I visited, his TF was being turned off. Per RN they will attempt extubation soon.  Labs: D5 @ 50 -> 204 calories Medications reviewed.    Diet Order:  Diet NPO time specified  Skin:  Wound (see comment) (medical healing wound to sacrum)  Last BM:  4/11  Height:   Ht Readings from Last 1 Encounters:  08/26/15 6\' 6"  (1.981 m)    Weight:   Wt Readings from Last 1 Encounters:  08/27/15 146 lb 9.7 oz (66.5 kg)    Ideal Body Weight:  97.27 kg  BMI:  Body mass index is 16.95 kg/(m^2).  Estimated Nutritional Needs:   Kcal:  1900-2100  Protein:  95-105g  Fluid:  >/= 2L  EDUCATION NEEDS:   No education needs identified at this time  Satira Anis. [place] Ndiaye, MS, RD  LDN After Hours/Weekend Pager [phone]

## 2015-08-27 NOTE — Progress Notes (Signed)
[place] Progress Note Patient Name: Grant [name] DOB: 1956-03-26 MRN: [id number]   Date of Service  08/27/2015  HPI/Events of Note  Pt with metastatic renal cell CA. Intubated for airway issues. Extubated this am. Drowsier, on and off.   Follows simple commands. Poor cough. 104/71, 83, 20  100% on 2L  Chronically ill   eICU Interventions  Try bipap for mechanical support. Told RN to call if with worsening resp and mental status.  Updated GF. Pt remains a full code.     Intervention Category Major Interventions: Airway management  Rush Landmark 08/27/2015, 6:31 PM

## 2015-08-28 ENCOUNTER — Inpatient Hospital Stay (HOSPITAL_COMMUNITY): Payer: Medicaid Other

## 2015-08-28 DIAGNOSIS — E860 Dehydration: Secondary | ICD-10-CM

## 2015-08-28 DIAGNOSIS — J9622 Acute and chronic respiratory failure with hypercapnia: Secondary | ICD-10-CM

## 2015-08-28 DIAGNOSIS — J690 Pneumonitis due to inhalation of food and vomit: Secondary | ICD-10-CM

## 2015-08-28 DIAGNOSIS — Z7189 Other specified counseling: Secondary | ICD-10-CM

## 2015-08-28 DIAGNOSIS — J9600 Acute respiratory failure, unspecified whether with hypoxia or hypercapnia: Secondary | ICD-10-CM | POA: Insufficient documentation

## 2015-08-28 DIAGNOSIS — J9601 Acute respiratory failure with hypoxia: Secondary | ICD-10-CM

## 2015-08-28 DIAGNOSIS — J9621 Acute and chronic respiratory failure with hypoxia: Secondary | ICD-10-CM

## 2015-08-28 DIAGNOSIS — C7800 Secondary malignant neoplasm of unspecified lung: Secondary | ICD-10-CM

## 2015-08-28 LAB — GLUCOSE, CAPILLARY
GLUCOSE-CAPILLARY: 150 mg/dL — AB (ref 65–99)
GLUCOSE-CAPILLARY: 126 mg/dL — AB (ref 65–99)
Glucose-Capillary: 162 mg/dL — ABNORMAL HIGH (ref 65–99)

## 2015-08-28 LAB — CBC
HCT: 26.2 % — ABNORMAL LOW (ref 39.0–52.0)
HEMOGLOBIN: 8.2 g/dL — AB (ref 13.0–17.0)
MCH: 26.5 pg (ref 26.0–34.0)
MCHC: 31.3 g/dL (ref 30.0–36.0)
MCV: 84.5 fL (ref 78.0–100.0)
PLATELETS: 283 10*3/uL (ref 150–400)
RBC: 3.1 MIL/uL — ABNORMAL LOW (ref 4.22–5.81)
RDW: 19.6 % — ABNORMAL HIGH (ref 11.5–15.5)
WBC: 14.5 10*3/uL — ABNORMAL HIGH (ref 4.0–10.5)

## 2015-08-28 LAB — BASIC METABOLIC PANEL
Anion gap: 7 (ref 5–15)
BUN: 13 mg/dL (ref 6–20)
CALCIUM: 7.3 mg/dL — AB (ref 8.9–10.3)
CHLORIDE: 108 mmol/L (ref 101–111)
CO2: 29 mmol/L (ref 22–32)
CREATININE: 0.91 mg/dL (ref 0.61–1.24)
GFR calc non Af Amer: >60 mL/min (ref >60)
GLUCOSE: 154 mg/dL — AB (ref 65–99)
Potassium: 4.1 mmol/L (ref 3.5–5.1)
Sodium: 144 mmol/L (ref 135–145)

## 2015-08-28 LAB — MAGNESIUM: Magnesium: 1.7 mg/dL (ref 1.7–2.4)

## 2015-08-28 LAB — LEGIONELLA PNEUMOPHILA SEROGP 1 UR AG: L. pneumophila Serogp 1 Ur Ag: NEGATIVE

## 2015-08-28 LAB — PHOSPHORUS: Phosphorus: 3.1 mg/dL (ref 2.5–4.6)

## 2015-08-28 MED ORDER — SODIUM CHLORIDE 0.9 % IV SOLN
1.0000 mg/h | INTRAVENOUS | Status: DC
  Administered 2015-08-28: 1 mg/h via INTRAVENOUS
  Administered 2015-08-30: 5 mg/h via INTRAVENOUS
  Filled 2015-08-28 (×2): qty 10

## 2015-08-28 MED ORDER — CHLORHEXIDINE GLUCONATE 0.12 % MT SOLN
15.0000 mL | Freq: Two times a day (BID) | OROMUCOSAL | Status: DC
  Administered 2015-08-28 – 2015-08-30 (×5): 15 mL via OROMUCOSAL
  Filled 2015-08-28 (×5): qty 15

## 2015-08-28 MED ORDER — MORPHINE BOLUS VIA INFUSION: 2.0000 mg | INTRAVENOUS | Status: DC | PRN

## 2015-08-28 MED ORDER — GLYCOPYRROLATE 0.2 MG/ML IJ SOLN
0.2000 mg | INTRAMUSCULAR | Status: DC | PRN
  Administered 2015-08-29 (×2): 0.2 mg via INTRAVENOUS
  Filled 2015-08-28 (×3): qty 1

## 2015-08-28 MED ORDER — VITAMINS A & D EX OINT
TOPICAL_OINTMENT | CUTANEOUS | Status: AC
  Administered 2015-08-28: 1
  Filled 2015-08-28: qty 5

## 2015-08-28 MED ORDER — RANITIDINE HCL 150 MG/10ML PO SYRP
150.0000 mg | ORAL_SOLUTION | Freq: Every day | ORAL | Status: DC
  Filled 2015-08-28 (×2): qty 10

## 2015-08-28 MED ORDER — MORPHINE BOLUS VIA INFUSION
2.0000 mg | INTRAVENOUS | Status: DC | PRN
  Administered 2015-08-28 – 2015-08-29 (×5): 2 mg via INTRAVENOUS
  Administered 2015-08-30 (×4): 4 mg via INTRAVENOUS
  Filled 2015-08-28 (×10): qty 4

## 2015-08-28 MED ORDER — MORPHINE SULFATE (PF) 2 MG/ML IV SOLN: 1.0000 mg | INTRAVENOUS | Status: DC | PRN

## 2015-08-28 MED ORDER — LORAZEPAM 2 MG/ML IJ SOLN
1.0000 mg | INTRAMUSCULAR | Status: DC | PRN
  Administered 2015-08-28: 1 mg via INTRAVENOUS
  Filled 2015-08-28: qty 1

## 2015-08-28 MED ORDER — CETYLPYRIDINIUM CHLORIDE 0.05 % MT LIQD
7.0000 mL | Freq: Two times a day (BID) | OROMUCOSAL | Status: DC
  Administered 2015-08-28 – 2015-08-30 (×5): 7 mL via OROMUCOSAL

## 2015-08-28 MED ORDER — HALOPERIDOL LACTATE 5 MG/ML IJ SOLN: 1.0000 mg | INTRAMUSCULAR | Status: DC | PRN

## 2015-08-28 MED ORDER — AMOXICILLIN-POT CLAVULANATE 875-125 MG PO TABS: 1.0000 | ORAL_TABLET | Freq: Two times a day (BID) | ORAL | Status: DC

## 2015-08-28 NOTE — Plan of Care (Signed)
Problem: Nutrition: Goal: Adequate nutrition will be maintained Outcome: Not Progressing Patient not taking in any PO's. Pallative care consult was started on 4/13

## 2015-08-28 NOTE — Progress Notes (Signed)
PULMONARY / CRITICAL CARE MEDICINE   Name: Grant [name] MRN: [id number] DOB: 11/20/1955    ADMISSION DATE:  09/07/2015 CONSULTATION DATE:  08/26/15  REFERRING MD:  Grandville Silos  CHIEF COMPLAINT:  Hypoxia  HISTORY OF PRESENT ILLNESS:  Pt is encephelopathic; therefore, this HPI is obtained from chart review. Grant [name] is a 60 y.o. male with PMH as outlined below including metastatic rectal CA, currently on salvage chemotherapy s/p cycle 5  FOLFIRI/panitumumab 08/11/15 (under the care of Dr. Learta Codding).  He was admitted 08/24/2015 with persistent diarrhea following chemo despite taking Imodium or Lomotil and also had pain in the sacrum and both legs.  He had generalized weakness and decreased oral intake along with failure to thrive.  He was admitted for supportive care and further evaluation.  On night of 08/25/15, pt had aspiration event followed by hypoxia and change in mental status.  Rapid response was called and pt was later transferred to ICU where he was intubated by anesthesia.  PCCM was called for further management.  Of note, he has been seen by Dr. Benay Spice this hospitalization who repeated staging CT 04/11 and was planning for hospice care if this showed no improvement following the FOLFIRI/panitumumab.  Code status was discussed with pt and HCPOA by Dr. Benay Spice and pt opted to continue with FULL CODE status for now.  SUBJECTIVE:  Extubated - required bipap for ? Drowsiness, back to [place] now Afebrile No obvious pain Poor UO with urostomy, diarrhea + in colostomy  VITAL SIGNS: BP 99/80 mmHg  [name] 89  Temp(Src) 97.6 F (36.4 C) (Axillary)  Resp 10  Ht 6\' 6"  (1.981 m)  Wt 148 lb 13 oz (67.5 kg)  BMI 17.20 kg/m2  SpO2 100%  HEMODYNAMICS:    VENTILATOR SETTINGS: Vent Mode:  [-] BIPAP FiO2 (%):  [40 %-55 %] 45 % Set Rate:  [15 bmp] 15 bmp PEEP:  [5 cmH20] 5 cmH20  INTAKE / OUTPUT: I/O last 3 completed shifts: In: 4313.4 [I.V.:2748.4; NG/GT:205; IV  Piggyback:1360] Out: 3100 [Urine:900; Stool:2200]   PHYSICAL EXAMINATION: General: Adult male, chronically ill appearing, in NAD. Neuro: drowsy, follows commands HEENT: [place]/AT. PERRL, sclerae anicteric. Cardiovascular: RRR, no M/R/G.  Lungs: Respirations even and unlabored.  CTA bilaterally, No W/R/R. Abdomen: BS x 4, soft, NT/ND.  Colostomy and urostomy in place. Musculoskeletal: No gross deformities, no edema.  Skin: Intact, warm, no rashes.  LABS:  BMET  Recent Labs Lab 08/26/15 0311 08/27/15 0440 08/27/15 2055 08/28/15 0640  NA 145 146*  --  144  K 3.7 2.7* 2.7* 4.1  CL 117* 107  --  108  CO2 17* 30  --  29  BUN 15 15  --  13  CREATININE 1.14 1.00  --  0.91  GLUCOSE 171* 128*  --  154*    Electrolytes  Recent Labs Lab 08/26/15 0311 08/27/15 0440 08/27/15 2055 08/28/15 0640  CALCIUM 8.6* 7.6*  --  7.3*  MG  --  1.3* 1.9 1.7  PHOS  --  1.3* 1.9* 3.1    CBC  Recent Labs Lab 08/26/15 0311 08/27/15 0440 08/28/15 0640  WBC 16.1* 10.5 14.5*  HGB 8.5* 8.0* 8.2*  HCT 27.8* 25.7* 26.2*  PLT 332 288 283    Coag's No results for input(s): APTT, INR in the last 168 hours.  Sepsis Markers No results for input(s): LATICACIDVEN, PROCALCITON, O2SATVEN in the last 168 hours.  ABG  Recent Labs Lab 08/26/15 0145 08/27/15 0327 08/27/15 1955  PHART 7.216* 7.493* 7.479*  PCO2ART 39.5 38.2 38.1  PO2ART 91.1 61.2* 94.2    Liver Enzymes No results for input(s): AST, ALT, ALKPHOS, BILITOT, ALBUMIN in the last 168 hours.  Cardiac Enzymes No results for input(s): TROPONINI, PROBNP in the last 168 hours.  Glucose  Recent Labs Lab 08/27/15 0747 08/27/15 1117 08/27/15 1554 08/27/15 1955 08/27/15 2351 08/28/15 0749  GLUCAP 103* 166* 139* 150* 162* 126*    Imaging Dg Chest Port 1 View  08/28/2015  CLINICAL DATA:  Respiratory failure EXAM: PORTABLE CHEST 1 VIEW COMPARISON:  Portable chest x-ray of August 27, 2015 FINDINGS: The lungs are adequately  inflated. The trachea and esophagus have been extubated. The interstitial markings are coarse at the lung bases greatest on the right. There is no discrete alveolar infiltrate. There is no pleural effusion or pneumothorax. The heart and pulmonary vascularity are normal. The power port appliance tip projects over the distal third of the SVC. IMPRESSION: Interval extubation of the trachea and esophagus. Stable stable mild bibasilar atelectasis. Electronically Signed   By: David  Martinique M.D.   On: 08/28/2015 07:12     STUDIES:  CT A / P / chest 04/11 > some positive response to therapy with slight decreased size of highly infiltrative renal mass.  Mass continues to show direct invasion of liver, right psoas, right quadratus lumborum, and other right sided flank musculature.  Pulmonary nodules appear generally stable.  Enlarging area of soft tissue thickening and osteolysis in the coccyx may reflect metastatic disease or may indicate chronic osteomyelitis.  New trace right pleural effusion.   CULTURES: Blood 04/11 >ng Urine 04/06 > > 100,000 yeast.  ANTIBIOTICS: Vanc 04/11 > Zosyn 04/11 > Fluconazole 04/08 >  SIGNIFICANT EVENTS: 04/06 > admitted. 04/11 > hypoxic respiratory failure following aspiration > transferred to ICU > intubated.  LINES/TUBES: ETT 04/11 >4/13  DISCUSSION: 60 y.o. M with hx metastatic rectal CA, s/p cycle 5 FOLFIRI / panitumumab 08/11/15 (followed by Dr. Benay Spice).  Admitted 04/06 with diarrhea and FTT.  Had aspiration event evening of 04/11 followed by AMS and hypoxic respiratory failure prompting transfer to ICU where he was intubated by anesthesia.  ASSESSMENT / PLAN:  HEMATOLOGIC / ONCOLOGIC A:   Metastatic rectal CA, s/p cycle 5 FOLFIRI / panitumumab 08/11/15 (followed by Dr. Benay Spice). Anemia - chronic. VTE Prophylaxis. P:  Oncology following SCD's / Lovenox.   PULMONARY A: Acute hypoxic respiratory failure. Aspiration PNA. P:   Extubated Dc  bipap   INFECTIOUS A:   Aspiration PNA. Candiduria. P:   Dc abx & diflucan  cultures neg -dc vanc. PCT algorithm not helpful in this immunocompromised host.  NEUROLOGIC A:   Acute metabolic encephalopathy. Failure to thrive - due to chemo / metastatic rectal CA. P:   Resume morphine prn D/c oxycodone,  alprazolam. Palliative care following  RENAL A:   NAGMA - due to diarrhea. Pseudohypocalcemia. Hypokalemia/ hypomag/ hypophos P:   Dc HCO3 Replete lytes   GASTROINTESTINAL A:   Diarrhea - likely secondary to chemo, octreotide helped GI prophylaxis. Nutrition. P:   SUP: Famotidine. Resume PO. dc octtreotide     Family updated: Grant [name] (girlfriend who is also POA) updated at bedside. 4/14  Interdisciplinary Family Meeting v Palliative Care Meeting:  ongoing  Summary - Intubated due to resp + NAG acidosis (due to diarrhea from chemo), guarded prognosis overall,extubated - DNR issued on 4/13 , goal is pain control & attempt rehab if he recovers transfer to floor & to [facility]   The patient is critically  ill with multiple organ systems failure and requires high complexity decision making for assessment and support, frequent evaluation and titration of therapies, application of advanced monitoring technologies and extensive interpretation of multiple databases. Critical Care Time devoted to patient care services described in this note independent of APP time is 31 minutes.    Kara Mead MD. Shade Flood. [facility] Pager [phone] If no response call [phone]     08/28/2015, 9:47 AM

## 2015-08-28 NOTE — Progress Notes (Signed)
Date:  August 28, 2015 Chart reviewed for concurrent status and case management needs. Will continue to follow patient for changes and needs: transferred to acute care floor Velva Harman, BSN, [place], [place]   [phone]

## 2015-08-28 NOTE — Evaluation (Signed)
SLP Cancellation Note  Patient Details Name: Grant [name] MRN: [id number] DOB: 1955-07-24   Cancelled treatment:       Reason Eval/Treat Not Completed: Medical issues which prohibited therapy;Fatigue/lethargy limiting ability to participate (spoke to RN who report pt received morphine last night and is not alert enough for po or eval, advised her to call weekend SLP at [phone] Sat/Sun if needed)   Luanna Salk, [facility] SLP [phone]

## 2015-08-28 NOTE — Progress Notes (Signed)
Daily Progress Note   Patient Name: Grant [name]       Date: 08/28/2015 DOB: Aug 15, 1955  Age: 60 y.o. MRN#: [id number] Attending Physician: Javier Glazier, MD Primary Care Physician: No primary care provider on file. Admit Date: 09/04/2015  Reason for Consultation/Follow-up: Establishing goals of care  Subjective: I met today with patient's long-term girlfriend Grant [name]. Mr. Bognar remains confused and was unable to participate in the conversation.  She reports being thankful that he was able to wake up enough to participate in conversation about his CODE STATUS last evening. He is now a DNR/DNI.  She reports understanding the fact that he remains critically ill and has a high risk of continued decompensation and death. At the same time, she states that she, "does not want to give up on him."  See goals of care below.  Length of Stay: 8 days  Current Medications: Scheduled Meds:  . antiseptic oral rinse  7 mL Mouth Rinse q12n4p  . chlorhexidine  15 mL Mouth Rinse BID  . collagenase   Topical Daily  . enoxaparin (LOVENOX) injection  40 mg Subcutaneous QHS  . ranitidine  150 mg Per Tube QHS  . saccharomyces boulardii  250 mg Oral BID    Continuous Infusions: . dextrose 5 % and 0.45% NaCl 50 mL/hr at 08/28/15 1010    PRN Meds: acetaminophen **OR** acetaminophen, diphenoxylate-atropine, ipratropium, levalbuterol, lidocaine-prilocaine, morphine injection, ondansetron **OR** ondansetron (ZOFRAN) IV, prochlorperazine, prochlorperazine  Physical Exam: Physical Exam  General: Adult male, chronically ill appearing, in NAD but with lots of secretions and coughing. HEENT: [place]/AT. PERRL, sclerae anicteric. Cardiovascular: RRR, no M/R/G.  Lungs: Respirations even and  unlabored. Abdomen: BS x 4, soft, NT/ND. Colostomy and urostomy in place. Musculoskeletal: No gross deformities, no edema.  Skin: Intact, warm, no rashes.             Vital Signs: BP 108/84 mmHg  [name] 101  Temp(Src) 97.6 F (36.4 C) (Axillary)  Resp 10  Ht _0  (1.981 m)  Wt 67.5 kg (148 lb 13 oz)  BMI 17.20 kg/m2  SpO2 97% SpO2: SpO2: 97 % O2 Device: O2 Device: Nasal Cannula O2 Flow Rate: O2 Flow Rate (L/min): 2 L/min  Intake/output summary:  Intake/Output Summary (Last 24 hours) at 08/28/15 1306 Last data filed at 08/28/15 1200  Gross  per 24 hour  Intake   1910 ml  Output   2260 ml  Net   -350 ml   LBM: Last BM Date: 08/28/15 Baseline Weight: Weight: 59.5 kg (131 lb 2.8 oz) Most recent weight: Weight: 67.5 kg (148 lb 13 oz)       Palliative Assessment/Data: Flowsheet Rows        Most Recent Value   Intake Tab    Referral Department  Pulmonary   Unit at Time of Referral  ICU   Palliative Care Primary Diagnosis  Cancer   Date Notified  08/25/15   Palliative Care Type  New Palliative care   Date of Admission  08/21/2015   Date first seen by Palliative Care  08/26/15   # of days Palliative referral response time  1 Day(s)   # of days IP prior to Palliative referral  5   Clinical Assessment    Palliative Performance Scale Score  10%   Pain Max last 24 hours  Not able to report   Pain Min Last 24 hours  Not able to report   Psychosocial & Spiritual Assessment    Palliative Care Outcomes    Patient/Family meeting held?  Yes   Who was at the meeting?  Long term girlfriend/POA   Palliative Care Outcomes  Clarified goals of care      Additional Data Reviewed: CBC    Component Value Date/Time   WBC 14.5* 08/28/2015 0640   WBC 0.5* 08/19/2015 1438   RBC 3.10* 08/28/2015 0640   RBC 3.64* 09/07/2015 1438   RBC 2.58* 05/14/2014 0730   HGB 8.2* 08/28/2015 0640   HGB 9.6* 08/16/2015 1438   HCT 26.2* 08/28/2015 0640   HCT 29.1* 08/21/2015 1438   PLT 283  08/28/2015 0640   PLT 277 08/21/2015 1438   MCV 84.5 08/28/2015 0640   MCV 79.9 08/19/2015 1438   MCH 26.5 08/28/2015 0640   MCH 26.4* 08/29/2015 1438   MCHC 31.3 08/28/2015 0640   MCHC 33.0 08/17/2015 1438   RDW 19.6* 08/28/2015 0640   RDW 17.1* 08/31/2015 1438   LYMPHSABS 0.4* 08/22/2015 0502   LYMPHSABS 0.1* 09/09/2015 1438   MONOABS 0.1 08/22/2015 0502   MONOABS 0.1 08/30/2015 1438   EOSABS 0.0 08/22/2015 0502   EOSABS 0.0 08/24/2015 1438   BASOSABS 0.0 08/22/2015 0502   BASOSABS 0.0 08/31/2015 1438    CMP     Component Value Date/Time   NA 144 08/28/2015 0640   NA 134* 08/24/2015 1438   K 4.1 08/28/2015 0640   K 4.1 08/28/2015 1438   CL 108 08/28/2015 0640   CO2 29 08/28/2015 0640   CO2 25 08/15/2015 1438   GLUCOSE 154* 08/28/2015 0640   GLUCOSE 127 08/29/2015 1438   BUN 13 08/28/2015 0640   BUN 17.8 08/31/2015 1438   CREATININE 0.91 08/28/2015 0640   CREATININE 1.3 08/22/2015 1438   CALCIUM 7.3* 08/28/2015 0640   CALCIUM 9.5 08/25/2015 1438   PROT 6.4 09/12/2015 1438   PROT 6.2 05/13/2014 1700   ALBUMIN 2.9* 08/24/2015 1438   ALBUMIN 2.7* 05/13/2014 1700   AST 9 09/10/2015 1438   AST 18 05/13/2014 1700   ALT <9 09/13/2015 1438   ALT 12 05/13/2014 1700   ALKPHOS 171* 09/03/2015 1438   ALKPHOS 74 05/13/2014 1700   BILITOT 0.88 08/18/2015 1438   BILITOT 0.6 05/13/2014 1700   GFRNONAA >60 08/28/2015 0640   GFRAA >60 08/28/2015 0640       Problem List:  Patient Active Problem List   Diagnosis Date Noted  . Encounter for intubation   . Aspiration pneumonia ([place]): Probable 08/25/2015  . SOB (shortness of breath)   . Candiduria 08/22/2015  . UTI (urinary tract infection) 08/21/2015  . Ulcer of sacral region 08/23/2015  . Neutropenia ([place]) 08/17/2015  . FTT (failure to thrive) in adult   . Rectal cancer metastasized to lung ([place]) 04/21/2015  . Abdominal abscess ([place])   . Renal abscess, right   . Renal cyst 05/13/2014  . Neoplasm related pain  05/13/2014  . Nausea without vomiting 05/13/2014  . Other malaise and fatigue 01/28/2014  . Pain in male perineum 01/28/2014  . Chewing tobacco nicotine dependence without complication 85/46/2703  . CA of rectum ([place]) 12/15/2013  . Dehydration 12/10/2013  . Diarrhea 12/10/2013  . Hyponatremia 12/10/2013  . Rectal cancer ([place]) 11/08/2013  . 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 Ureteral obstruction, right 10/10/2013  . Anemia of chronic disease 10/10/2013  . Severe protein-calorie malnutrition ([place]) 10/10/2013  . Microcytic anemia 10/10/2013  . Rectal mass 10/10/2013     Palliative Care Assessment & Plan    1.Code Status:  DNR    Code Status Orders        Start     Ordered   08/27/15 2140  Do not attempt resuscitation (DNR)   Continuous    Question Answer Comment  In the event of cardiac or respiratory ARREST Do not call a "code blue"   In the event of cardiac or respiratory ARREST Do not perform Intubation, CPR, defibrillation or ACLS   In the event of cardiac or respiratory ARREST Use medication by any route, position, wound care, and other measures to relive pain and suffering. May use oxygen, suction and manual treatment of airway obstruction as needed for comfort.   Comments No Vasopressors for BP support      08/27/15 2139    Code Status History    Date Active Date Inactive Code Status Order ID Comments User Context   08/19/2015  5:42 PM 08/27/2015  9:39 PM Full Code [id number]  Eugenie Filler, MD Inpatient   05/15/2014 12:33 PM 05/22/2014  3:44 PM Full Code [id number]  Jacqulynn Cadet, MD Inpatient   05/13/2014  4:50 PM 05/15/2014 12:33 PM Full Code [id number]  [name], MD Inpatient   03/12/2014  3:50 PM 03/13/2014  3:39 AM Full Code [id number]  Azzie Roup, MD [place]   12/10/2013 10:44 PM 12/13/2013  4:36 AM Full Code [id number]  Berle Mull, MD ED   10/18/2013  5:32 PM 10/20/2013  6:25 PM Full Code [id number]  Markus Daft, MD Inpatient   10/13/2013  7:39 AM 10/18/2013   5:32 PM Full Code [id number]  Sandi Mariscal, MD Inpatient   10/09/2013  8:58 PM 10/13/2013  7:39 AM Full Code [id number]  [name], MD Inpatient   10/09/2013  7:15 PM 10/09/2013  8:58 PM Full Code [id number]  Shanda Howells, MD ED    Advance Directive Documentation        Most Recent Value   Type of Advance Directive  Healthcare Power of Attorney, Living will   Pre-existing out of facility DNR order (yellow form or pink MOST form)     "MOST" Form in Place?         2. Goals of Care/Additional Recommendations:  -  I met with Mr. Lowery girlfriend/POA and we discussed at length the critical nature of his condition. She is in agreement with plan to  continue with current therapy for another 24 hours and reassess his situation. Also discussed that if his condition continues to worsen in the interim, we should ensure that he receives any medications necessary to ensure his comfort even if there is a possibility this would further compromise his respiratory status. She reports being "okay" if he continues to decline and dies, however, she reports she is not at a point where she is willing to "give up" and focus strictly on comfort care. We'll plan to meet again to continue conversation and reassess his clinical condition tomorrow. I did share with her that I am very concerned he will continue decompensate and this may be a terminal admission.  3. Symptom Management:      1. Pain/dyspnea: Continue morphine as needed.  4. Palliative Prophylaxis:   Aspiration, Delirium Protocol and Frequent Pain Assessment  5. Prognosis: Unable to determine but guarded based upon his current clinical condition. I do feel it is very likely that he will continue decompensate and this may be a terminal admission.  6. Discharge Planning:  To be determined   Care plan was discussed with patient's POA, Dr Elsworth Soho, and bedside RN   Thank you for allowing the Palliative Medicine Team to assist in the care of this  patient.   Time In: 1210 Time Out: 1240 Total Time 30 Prolonged Time Billed no        Micheline Rough, MD  08/28/2015, 1:06 PM  Please contact Palliative Medicine Team phone at [phone] for questions and concerns.

## 2015-08-28 NOTE — Progress Notes (Signed)
Received call from RN that [name], patient's girlfriend/POA, wants to focus on ensuring that Mr. Antonopoulos is comfortable.  This is something that we discussed earlier today.  Morphine infusion was already ordered by hospitalist.  I added on bolus morphine dosing, Ativan as needed for anxiety, Haldol as needed for agitation, and robinul as needed for excess secretions in case these become an issue overnight.  Will f/u in person on arrival in AM to discuss further and assess for symptom management.  Micheline Rough, MD [place] Team [phone]

## 2015-08-28 NOTE — Progress Notes (Signed)
Pt currently off BIPAP at this time.  

## 2015-08-28 NOTE — Progress Notes (Signed)
Nutrition Follow-up  DOCUMENTATION CODES:   Underweight, Severe malnutrition in context of chronic illness  INTERVENTION:  - Diet advancement as medically feasible/within GOC - RD will continue to monitor for nutrition-related needs  NUTRITION DIAGNOSIS:   Malnutrition related to chronic illness as evidenced by severe depletion of muscle mass, severe depletion of body fat. -ongoing  GOAL:   Patient will meet greater than or equal to 90% of their needs -unmet currently  MONITOR:   Diet advancement, Weight trends, Labs, Skin, I & O's  ASSESSMENT:   Grant [name] 60 yo male With history of metastatic rectal cancer currently on salvage chemotherapy, microcytic anemia, status post colostomy and urostomy who completed another cycle of chemotherapy beginning 08/11/2015 who presents to oncologist office with ongoing diarrhea which developed on the day of chemotherapy that has persisted despite Imodium or Lomotil with pain in the sacrum region and in both legs.  Pt extubated after RD assessment 4/13. Pt was placed on BiPAP which has now been d/c'ed and he is on [place]; discomfort on BiPAP and notes indicate pt now DNR/DNI with plan to focus on relieving discomfort. MD note from this AM states plan for PO and to transfer to the floor.  Pt unable to meet needs at this time. Nutrition needs updates d/p extubation. Medications reviewed. IVF: D5-1/2 NS @ 50 mL/hr (204 kcal). Labs reviewed; CBGs: 103-166 mg/dL, Ca: 7.3 mg/dL.   Diet Order:  Diet NPO time specified  Skin:  Wound (see comment) (medical healing wound to sacrum)  Last BM:  4/14  Height:   Ht Readings from Last 1 Encounters:  08/26/15 6\' 6"  (1.981 m)    Weight:   Wt Readings from Last 1 Encounters:  08/28/15 148 lb 13 oz (67.5 kg)    Ideal Body Weight:  97.27 kg  BMI:  Body mass index is 17.2 kg/(m^2).  Estimated Nutritional Needs:   Kcal:  2025-2225 (30-33 kcal/kg)  Protein:  100-110 grams  Fluid:  >/=  2L  EDUCATION NEEDS:   No education needs identified at this time     Jarome Matin, RD, LDN Inpatient Clinical Dietitian Pager # [phone] After hours/weekend pager # [phone]

## 2015-08-28 NOTE — Progress Notes (Signed)
Vent /bipap removed from room.

## 2015-08-28 NOTE — Progress Notes (Signed)
PT Cancellation Note  Patient Details Name: Grant [name] MRN: [id number] DOB: 1955/09/10   Cancelled Treatment:    Reason Eval/Treat Not Completed: Fatigue/lethargy limiting ability to participate. Patient is resting soundly. Notes reviewed. Patient was  Unable to tolerate PT last visit, has been on  Vent. Now DNR , on IV morphine. Will check back at a  Later time to determine if patient is able to participate .  Claretha Cooper 08/28/2015, 9:00 AM Tresa Endo PT [phone]

## 2015-08-28 NOTE — Progress Notes (Signed)
IP PROGRESS NOTE  Subjective:   [name] is lethargic, appears comfortable.  Objective: Vital signs in last 24 hours: Blood pressure 94/69, pulse 95, temperature 97.3 F (36.3 C), temperature source Axillary, resp. rate 14, height 6\' 6"  (1.981 m), weight 148 lb 13 oz (67.5 kg), SpO2 100 %.  Intake/Output from previous day: 04/13 0701 - 04/14 0700 In: 2384.6 [I.V.:1104.6; NG/GT:170; IV Piggyback:1110] Out: 2400 [Urine:550; Stool:1850]  Physical Exam: Lungs: Moves air bilaterally Cardiac: Regular rate and rhythm Lungs: Decreased breath sounds in the right chest, no respiratory distress Cardiac: Distant heart sounds  Abdomen: Right lower quadrant urostomy, left lower quadrant colostomy with semi-formed, the abdomen is soft   neurologic: Lethargic, opens eyes, speaks a few words   Portacath/PICC-without erythema  Lab Results:  Recent Labs  08/27/15 0440 08/28/15 0640  WBC 10.5 14.5*  HGB 8.0* 8.2*  HCT 25.7* 26.2*  PLT 288 283   BMET  Recent Labs  08/27/15 0440 08/27/15 2055 08/28/15 0640  NA 146*  --  144  K 2.7* 2.7* 4.1  CL 107  --  108  CO2 30  --  29  GLUCOSE 128*  --  154*  BUN 15  --  13  CREATININE 1.00  --  0.91  CALCIUM 7.6*  --  7.3*     Medications: I have reviewed the patient's current medications.  Assessment/Plan:  1. Metastatic rectal cancer, status post cycle 5 FOLFIRI/panitumumab 08/11/2015  Restaging CT 08/26/2015 with progressive airspace disease in the lower chest, decreased size of lung nodules and right renal mass, slight progression of mass at the coccyx 2. History of right hydronephrosis, right renal abscess, status post placement of a right ureter stent 3. Pain at the sacrum and legs following the APR/sacrectomy, positive FDG at the tip at the sacrum on a PET 03/05/2015 4. Diarrhea secondary to chemotherapy-continue Imodium, Lomotil, and octreotide 5. Severe neutropenia secondary to chemotherapy-the white count has  recovered 6. Sacral decubitus ulcer 7.  Altered mental status-likely hospital delirium, hypoxia, and narcotics 8. Respiratory failure 08/25/2015-likely secondary to aspiration, intubated 08/25/2015   Grant [name] was extubated yesterday. He remains lethargic. The lethargy may be related to respiratory failure/critical illness or narcotics. I discussed the situation with his significant other. She understands he may not survive this hospital admission and is in agreement with a no CODE BLUE status.  He continues to have loose stool output from the colostomy, but this appears to be slowing. He may be a candidate for home Hospice care if he survives this admission.  Recommendations  1. Antibiotics for the aspiration pneumonia 2. Limit narcotics as tolerated secondary to lethargy 3. Please call Oncology as needed over the weekend. I will check on him 08/31/2015.   LOS: 8 days   Betsy Coder, MD   08/28/2015, 2:38 PM

## 2015-08-29 DIAGNOSIS — J9600 Acute respiratory failure, unspecified whether with hypoxia or hypercapnia: Secondary | ICD-10-CM

## 2015-08-29 DIAGNOSIS — B3749 Other urogenital candidiasis: Secondary | ICD-10-CM

## 2015-08-29 LAB — BASIC METABOLIC PANEL
ANION GAP: 9 (ref 5–15)
BUN: 13 mg/dL (ref 6–20)
CALCIUM: 8 mg/dL — AB (ref 8.9–10.3)
CO2: 30 mmol/L (ref 22–32)
Chloride: 108 mmol/L (ref 101–111)
Creatinine, Ser: 0.88 mg/dL (ref 0.61–1.24)
Glucose, Bld: 151 mg/dL — ABNORMAL HIGH (ref 65–99)
POTASSIUM: 4.1 mmol/L (ref 3.5–5.1)
Sodium: 147 mmol/L — ABNORMAL HIGH (ref 135–145)

## 2015-08-29 LAB — CBC
HCT: 27.6 % — ABNORMAL LOW (ref 39.0–52.0)
HEMOGLOBIN: 8.4 g/dL — AB (ref 13.0–17.0)
MCH: 26 pg (ref 26.0–34.0)
MCHC: 30.4 g/dL (ref 30.0–36.0)
MCV: 85.4 fL (ref 78.0–100.0)
Platelets: 351 10*3/uL (ref 150–400)
RBC: 3.23 MIL/uL — AB (ref 4.22–5.81)
RDW: 19.4 % — ABNORMAL HIGH (ref 11.5–15.5)
WBC: 23.2 10*3/uL — ABNORMAL HIGH (ref 4.0–10.5)

## 2015-08-29 LAB — CULTURE, RESPIRATORY W GRAM STAIN

## 2015-08-29 LAB — CULTURE, RESPIRATORY

## 2015-08-29 MED ORDER — SODIUM CHLORIDE 0.9% FLUSH: 3.0000 mL | INTRAVENOUS | Status: DC | PRN

## 2015-08-29 MED ORDER — SODIUM CHLORIDE 0.9 % IV SOLN: 250.0000 mL | INTRAVENOUS | Status: DC | PRN

## 2015-08-29 MED ORDER — SODIUM CHLORIDE 0.9% FLUSH
3.0000 mL | Freq: Two times a day (BID) | INTRAVENOUS | Status: DC
  Administered 2015-08-29: 3 mL via INTRAVENOUS

## 2015-08-29 NOTE — Progress Notes (Signed)
Daily Progress Note   Patient Name: Grant [name]       Date: 08/29/2015 DOB: [birth year]  Age: 60 y.o. MRN#: [id number] Attending Physician: Orson Eva, MD Primary Care Physician: No primary care provider on file. Admit Date: 09/02/2015  Reason for Consultation/Follow-up: Establishing goals of care  Subjective: I met today with patient's long-term girlfriend [name]. Decision was made last evening to transition to comfort care only. She reports that he continued to worsen throughout the day yesterday and she was worried we were reaching a point where he was beginning to suffer.  She reports he was much more comfortable overnight and without distress. We talked about discontinuing interventions that are not focused strictly on his comfort while continuing with any and all measures that would add to his comfort. She is in agreement with this plan moving forward.  Length of Stay: 9 days  Current Medications: Scheduled Meds:  . antiseptic oral rinse  7 mL Mouth Rinse q12n4p  . chlorhexidine  15 mL Mouth Rinse BID  . sodium chloride flush  3 mL Intravenous Q12H    Continuous Infusions: . morphine 2 mg/hr (08/28/15 2058)    PRN Meds: sodium chloride, acetaminophen **OR** acetaminophen, glycopyrrolate, haloperidol lactate, ipratropium, levalbuterol, lidocaine-prilocaine, LORazepam, morphine, ondansetron **OR** ondansetron (ZOFRAN) IV, prochlorperazine, sodium chloride flush  Physical Exam: Physical Exam  General: Does not respond to verbal or tactile stimulation, in NAD. Cardiovascular: RRR, no M/R/G.  Resp: Short periods of apnea noted. Abdomen: BS x 4, soft, NT/ND. Musculoskeletal: No gross deformities, no edema.  Skin: Intact, warm, no rashes.             Vital Signs: BP 106/80  mmHg  Pulse 131  Temp(Src) 98.4 F (36.9 C) (Oral)  Resp 18  Ht 6' 6"  (1.981 m)  Wt 67.5 kg (148 lb 13 oz)  BMI 17.20 kg/m2  SpO2 94% SpO2: SpO2: 94 % O2 Device: O2 Device: Nasal Cannula O2 Flow Rate: O2 Flow Rate (L/min): 2 L/min  Intake/output summary:   Intake/Output Summary (Last 24 hours) at 08/29/15 0920 Last data filed at 08/29/15 0555  Gross per 24 hour  Intake 1040.55 ml  Output    635 ml  Net 405.55 ml   LBM: Last BM Date: 08/29/15 Baseline Weight: Weight: 59.5 kg (131 lb 2.8 oz) Most recent weight:  Weight: 67.5 kg (148 lb 13 oz)       Palliative Assessment/Data: Flowsheet Rows        Most Recent Value   Intake Tab    Referral Department  Pulmonary   Unit at Time of Referral  ICU   Palliative Care Primary Diagnosis  Cancer   Date Notified  08/25/15   Palliative Care Type  New Palliative care   Date of Admission  09/03/2015   Date first seen by Palliative Care  08/26/15   # of days Palliative referral response time  1 Day(s)   # of days IP prior to Palliative referral  5   Clinical Assessment    Palliative Performance Scale Score  10%   Pain Max last 24 hours  Not able to report   Pain Min Last 24 hours  Not able to report   Psychosocial & Spiritual Assessment    Palliative Care Outcomes    Patient/Family meeting held?  Yes   Who was at the meeting?  Long term girlfriend/POA   Palliative Care Outcomes  Clarified goals of care      Additional Data Reviewed: CBC    Component Value Date/Time   WBC 23.2* 08/29/2015 0500   WBC 0.5* 09/08/2015 1438   RBC 3.23* 08/29/2015 0500   RBC 3.64* 09/06/2015 1438   RBC 2.58* 05/14/2014 0730   HGB 8.4* 08/29/2015 0500   HGB 9.6* 08/25/2015 1438   HCT 27.6* 08/29/2015 0500   HCT 29.1* 08/25/2015 1438   PLT 351 08/29/2015 0500   PLT 277 09/13/2015 1438   MCV 85.4 08/29/2015 0500   MCV 79.9 08/19/2015 1438   MCH 26.0 08/29/2015 0500   MCH 26.4* 08/17/2015 1438   MCHC 30.4 08/29/2015 0500   MCHC 33.0  08/17/2015 1438   RDW 19.4* 08/29/2015 0500   RDW 17.1* 08/26/2015 1438   LYMPHSABS 0.4* 08/22/2015 0502   LYMPHSABS 0.1* 08/25/2015 1438   MONOABS 0.1 08/22/2015 0502   MONOABS 0.1 08/30/2015 1438   EOSABS 0.0 08/22/2015 0502   EOSABS 0.0 09/02/2015 1438   BASOSABS 0.0 08/22/2015 0502   BASOSABS 0.0 08/22/2015 1438    CMP     Component Value Date/Time   NA 147* 08/29/2015 0500   NA 134* 08/26/2015 1438   K 4.1 08/29/2015 0500   K 4.1 08/23/2015 1438   CL 108 08/29/2015 0500   CO2 30 08/29/2015 0500   CO2 25 09/09/2015 1438   GLUCOSE 151* 08/29/2015 0500   GLUCOSE 127 09/09/2015 1438   BUN 13 08/29/2015 0500   BUN 17.8 09/07/2015 1438   CREATININE 0.88 08/29/2015 0500   CREATININE 1.3 08/21/2015 1438   CALCIUM 8.0* 08/29/2015 0500   CALCIUM 9.5 08/23/2015 1438   PROT 6.4 09/13/2015 1438   PROT 6.2 05/13/2014 1700   ALBUMIN 2.9* 09/13/2015 1438   ALBUMIN 2.7* 05/13/2014 1700   AST 9 08/27/2015 1438   AST 18 05/13/2014 1700   ALT <9 08/31/2015 1438   ALT 12 05/13/2014 1700   ALKPHOS 171* 09/03/2015 1438   ALKPHOS 74 05/13/2014 1700   BILITOT 0.88 08/27/2015 1438   BILITOT 0.6 05/13/2014 1700   GFRNONAA >60 08/29/2015 0500   GFRAA >60 08/29/2015 0500       Problem List:  Patient Active Problem List   Diagnosis Date Noted  . Acute respiratory failure ([place])   . Encounter for intubation   . Aspiration pneumonia ([place]): Probable 08/25/2015  . SOB (shortness of breath)   . Candiduria  08/22/2015  . UTI (urinary tract infection) 08/21/2015  . Ulcer of sacral region 09/10/2015  . Neutropenia ([place]) 08/15/2015  . FTT (failure to thrive) in adult   . 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 Abdominal abscess ([place])   . Renal abscess, right   . Renal cyst 05/13/2014  . Neoplasm related pain 05/13/2014  . Nausea without vomiting 05/13/2014  . Other malaise and fatigue 01/28/2014  . Pain in male perineum 01/28/2014  . Chewing tobacco nicotine  dependence without complication 32/44/0102  . CA of rectum ([place]) 12/15/2013  . Dehydration 12/10/2013  . Diarrhea 12/10/2013  . Hyponatremia 12/10/2013  . Rectal cancer ([place]) 11/08/2013  . 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 Ureteral obstruction, right 10/10/2013  . Anemia of chronic disease 10/10/2013  . Severe protein-calorie malnutrition ([place]) 10/10/2013  . Microcytic anemia 10/10/2013  . Rectal mass 10/10/2013     Palliative Care Assessment & Plan    1.Code Status:  DNR    Code Status Orders        Start     Ordered   08/27/15 2140  Do not attempt resuscitation (DNR)   Continuous    Question Answer Comment  In the event of cardiac or respiratory ARREST Do not call a "code blue"   In the event of cardiac or respiratory ARREST Do not perform Intubation, CPR, defibrillation or ACLS   In the event of cardiac or respiratory ARREST Use medication by any route, position, wound care, and other measures to relive pain and suffering. May use oxygen, suction and manual treatment of airway obstruction as needed for comfort.   Comments No Vasopressors for BP support      08/27/15 2139    Code Status History    Date Active Date Inactive Code Status Order ID Comments User Context   08/19/2015  5:42 PM 08/27/2015  9:39 PM Full Code [id number]  Eugenie Filler, MD Inpatient   05/15/2014 12:33 PM 05/22/2014  3:44 PM Full Code [id number]  Jacqulynn Cadet, MD Inpatient   05/13/2014  4:50 PM 05/15/2014 12:33 PM Full Code [id number]  [name], MD Inpatient   03/12/2014  3:50 PM 03/13/2014  3:39 AM Full Code [id number]  Azzie Roup, MD [facility]   12/10/2013 10:44 PM 12/13/2013  4:36 AM Full Code [id number]  Berle Mull, MD ED   10/18/2013  5:32 PM 10/20/2013  6:25 PM Full Code [id number]  Markus Daft, MD Inpatient   10/13/2013  7:39 AM 10/18/2013  5:32 PM Full Code [id number]  Sandi Mariscal, MD Inpatient   10/09/2013  8:58 PM 10/13/2013  7:39 AM Full Code [id number]  [name], MD Inpatient   10/09/2013   7:15 PM 10/09/2013  8:58 PM Full Code [id number]  Shanda Howells, MD ED    Advance Directive Documentation        Most Recent Value   Type of Advance Directive  Healthcare Power of Attorney, Living will   Pre-existing out of facility DNR order (yellow form or pink MOST form)     "MOST" Form in Place?         2. Goals of Care/Additional Recommendations:  -  I met with Mr. Hassing girlfriend/POA.  She reports feeling at peace as she feels that we "gave him every chance, but he was starting to suffer and it was time."     - Plan for comfort care moving forward. He is transitioning to phase of actively dying.   3. Symptom Management:  1. Pain/dyspnea: Continue morphine.  He is currently on continuous infusion and resting comfortably.      2. Anxiety: Ativan as needed      3. Agitation: Haldol as needed      4. Excess secretions: Robinul as needed  4. Palliative Prophylaxis:   Aspiration, Delirium Protocol and Frequent Pain Assessment  5. Prognosis: Hours - Days.    6. Discharge Planning:  Anticipated Hospital Death most likely.  If his condition were to stabilize over the next day or so, will begin discussion about transition to residential hospice. I doubt that he will become stable enough to consider this option.   Care plan was discussed with patient's POA and bedside RN   Thank you for allowing the Palliative Medicine Team to assist in the care of this patient.   Time In: 0850 Time Out: 0920 Total Time 30 Prolonged Time Billed no        Micheline Rough, MD  08/29/2015, 9:20 AM  Please contact Palliative Medicine Team phone at [phone] for questions and concerns.

## 2015-08-29 NOTE — Progress Notes (Signed)
PROGRESS NOTE  Grant [name] [id number] DOB: 1956-04-28 DOA: 08/31/2015 PCP: No primary care provider on file.  Hospital Summary 60 y.o. male with history of metastatic rectal cancer currently on salvage chemotherapy, microcytic anemia, status post colostomy and urostomy who completed another cycle of chemotherapy (FOLFIRI/panitumumab) on 08/11/2015 who presents to oncologist office with ongoing diarrhea which developed on the day of chemotherapy that has persisted despite Imodium or Lomotil with pain in the sacrum region and in both legs. Patient noted to be significantly weak with decreased oral intake however drinking fluids. He received Neulasta 08/13/2015. The patient was noted to have yeast in his urine with significant pyuria. He was started on fluconazole. C. Difficile assay and GI pathogen panel has been negative. The patient was started on octreotide 08/22/15 by Dr. Benay Spice in hopes to slow his diarrhea. He was continued on imodium and lomotil. He was continued on MS Contin,oxycodone,and alprazolam. He was on intravenous morphine for break through pain. On the evening of 08/25/2015, the patient developed respiratory distress with oxygen saturation in the 50s. The patient was intubated. It was felt that the patient likely had an aspiration event followed by hypoxia and change in his mental status. It was thought that his opioids in conjunction with his non-anion gapped metabolic acidosis (NAG) may have contributed to his clinical decline. As result, his alprazolam, morphine, and oxycodone were discontinued. Critical care medicine assumed care from that point forward. 08/25/2015 CT of the chest to suggest a possible aspiration. Due to his respiratory failure and CT findings, the patient was started initially on Zosyn and vancomycin. This was narrowed to Zosyn. The patient was subsequently extubated on 08/27/2015, and his care was transferred to [facility] service on 08/28/15.  Although  the patient's power of attorney struggled initially with the patient's declining medical condition, she ultimately decided to transition the patient to focus on comfort care after multiple discussions with palliative medicine. Assessment/Plan: Acute respiratory failure with hypoxia -secondary to aspiration pneumonitis in the setting of opioid use and NAG -continue Zosyn day #4-->augmentin -Wean oxygen as tolerated -Pulmonary hygiene -during the night on 08/28/2015, the patient's POA decided to change the patient's focus of care to full comfort. The patient was started on a morphine drip. -no longer active issue as to patient's focus of care has been transitioned to that  focused on comfort  Metastatic rectal CA, s/p cycle 5 FOLFIRI / panitumumab 08/11/15 -followed by Dr. Benay Spice -4/11/2017CT abdomen and pelvis showed enlarging soft tissue thickened area with osteolysis in the coccyx -here was also borderline dilated loops of bowel with air-fluid levels concerning for possible enteritis. There was large volume stool in the colon. -abdomen also shows direct invasion of the liver, right psoas muscle, right quadratus lumborum and other posterior right-sided flank musculature. Pulmonary nodules appear stable however widespread  -patient remains full code--prognosis poor -CT of the chest with concerns of possible aspiration pneumonia versus lymphangitic spread -no longer active issue as to patient's focus of care has been transitioned to that  focused on comfort  Failure to thrive/deconditioning/severe malnutrition -Nutritional supplementation once able to take oral intake  Diarrhea -Suspect Enteritis secondary to chemotherapy -octreotide helped-->d/c by PCCM on 4/14 (4/19-->4/14) -monitor off octreotide --no longer active issue as to patient's focus of care has been transitioned to that  focused on comfort  Candida UTI -Continue fluconazole (4/8-->4/14) -no longer active issue as to  patient's focus of care has been transitioned to that  focused on comfort  Dehydration/AKI -secondary to GI losses -continue IVF -serum creatinine improved  ulcer of the sacral region -Minimize opioids and hypnotics -Continue wound care. -present at time of admission -unstageable per WOCN  History of right hydronephrosis/right renal abscess/status post placement of right ureteral stent  Stable. Good urine output. Follow.    Family Communication: Significant other updated at beside 4/15care Disposition Plan: expect hospital death   Consultants:  Oncology: Dr. Benay Spice 09/06/2015  PCCM  Palliative  Antibiotics: Vanc 04/11 >4/13 Zosyn 04/11 >4/14 Fluconazole 04/08 >4/14 Augmentin 4/14>>>   Procedures/Studies: Ct Abdomen Pelvis W Wo Contrast  08/25/2015  CLINICAL DATA:  60 year old male with history of rectal cancer diagnosed in 2015, with renal metastasis diagnosed in 2016, as well as lung metastases and bone metastases. Ongoing chemotherapy. Increased colostomy output. Diffuse pain. EXAM: CT CHEST, ABDOMEN, AND PELVIS WITH CONTRAST TECHNIQUE: Multidetector CT imaging of the chest, abdomen and pelvis was performed following the standard protocol during bolus administration of intravenous contrast. CONTRAST:  61mL ISOVUE-300 IOPAMIDOL (ISOVUE-300) INJECTION 61% COMPARISON:  CT of the chest, abdomen and pelvis 06/02/2015. FINDINGS: CT CHEST FINDINGS Mediastinum/Lymph Nodes: Heart size is normal. There is no significant pericardial fluid, thickening or pericardial calcification. There is atherosclerosis of the thoracic aorta, the great vessels of the mediastinum and the coronary arteries, including calcified atherosclerotic plaque in the left anterior descending coronary artery. Mildly enlarged low right paratracheal lymph node measuring 1 cm in short axis. Enlarged low left paratracheal lymph node measuring 1 cm in short axis. No hilar lymphadenopathy. Esophagus is unremarkable  in appearance. No axillary lymphadenopathy. Lungs/Pleura: Evaluation of the lung parenchyma is limited by considerable patient respiratory motion on today's examination. Previously noted pulmonary nodules in the right middle lobe appear slightly smaller and less distinct than the prior study, largest of which measures 8 mm on today's examination (image 83 of series 8), suggesting some positive response to therapy. Previously noted nodule in the base of the left lower lobe currently measures 13 x 9 mm (image 74 of series 8) and is similar to the prior examination. Previously noted nodules in the right lung base are now obscured by considerable airspace consolidation dependently, and extensive peribronchovascular micronodularity which is present throughout all aspects of the lung bases bilaterally, but is most severe in the right lower lobe. Many of the right lower lobe bronchi in the basal segments appear completely opacified by retained secretions. Similar findings are present to a lesser degree in the left lower lobe. This patchy peribronchovascular micronodularity is present throughout all aspects of the lungs bilaterally extending into the lung apices, but does have a basal predominance where there is also some associated septal thickening and very mild septal nodularity. Trace right pleural effusion lying dependently. Musculoskeletal/Soft Tissues: There are no aggressive appearing lytic or blastic lesions noted in the visualized portions of the skeleton. CT ABDOMEN AND PELVIS FINDINGS Hepatobiliary: In the inferior aspect of segment 6 of the liver there appears to be direct invasion of the hepatic parenchyma from the patient's large right renal metastasis. This is best appreciated on coronal image 59 of series 604. No other suspicious cystic or solid hepatic lesions are noted. No intra or extrahepatic biliary ductal dilatation. Diffuse periportal edema. Gallbladder is moderately distended. However, there are no  gallstones, and no pericholecystic fluid or surrounding inflammatory changes to suggest an acute cholecystitis at this time. Pancreas: No pancreatic mass. No pancreatic ductal dilatation. No pancreatic or peripancreatic fluid or inflammatory changes. Spleen: Unremarkable. Adrenals/Urinary Tract:  Bilateral adrenal glands and the left kidney are normal in appearance. Again noted are multiple infiltrative masses within the right kidney related to known metastatic disease. On today's study this currently measures approximately 13.5 x 9.0 x 13.0 cm (slightly smaller than the prior examination), and is highly invasive into adjacent structures, including the under surface of segment 6 of the liver and posteriorly into the right psoas, quadratus lumborum and right flank musculature. Status post radical cystectomy with ileal conduit. Stomach/Bowel: The appearance of the stomach is normal. There multiple borderline dilated loops of small bowel demonstrating air-fluid levels. These measure up to 3.8 cm in diameter. Status post low anterior resection. Left lower quadrant colostomy. Large amount of liquid stool in the colon. Vascular/Lymphatic: Atherosclerosis throughout the abdominal and pelvic vasculature, without evidence of aneurysm or dissection. Multiple borderline enlarged retroperitoneal lymph nodes are again noted, but there are no definite pathologically enlarged lymph nodes identified in the abdomen or pelvis. Reproductive: Status post radical prostatectomy. Other: Soft tissue thickening in the presacral space and along the right pelvic sidewall, similar to prior examinations, presumably post treatment related. Trace volume of ascites. No pneumoperitoneum. Mild diffuse mesenteric edema. Musculoskeletal: Again noted is some osteolysis and associated soft tissue thickening of the coccyx, which currently measures up to 4.4 x 4.1 cm. Whether or not this is from an osseous metastasis or is related to chronic osteomyelitis  is uncertain, but this appears to have progressed slightly compared to the prior examination. IMPRESSION: 1. Today's study demonstrates some positive response to therapy with slight decreased size of the highly infiltrative right renal mass. This mass continues to show direct invasion of segment 6 of the liver, the right psoas muscle, right quadratus lumborum and other posterior right-sided flank musculature. 2. Previously noted pulmonary nodules appear generally stable to slightly decreased compared to the prior examination, however, assessment is limited on today's study given the widespread peribronchovascular nodularity seen on today's examination. Although the appearance of today's study favors infectious/inflammatory etiologies, potentially related to significant aspiration, the possibility of developing areas of lymphangitic spread of disease are not excluded, particularly in the lung bases. 3. Borderline dilated loops of small bowel with multiple air-fluid levels. In addition, there is a large volume of liquid stool throughout the colon. Findings do not suggest obstruction, but may indicate an enteritis. 4. Enlarging area of soft tissue thickening and osteolysis in the coccyx may reflect metastatic disease, or may indicate chronic osteomyelitis. 5. New trace right pleural effusion. 6. Additional findings, as above, similar to prior examinations. Electronically Signed   By: Vinnie Langton M.D.   On: 08/25/2015 11:17   X-ray Chest Pa And Lateral  08/31/2015  CLINICAL DATA:  Shortness of breath.  Rectal cancer EXAM: CHEST  2 VIEW COMPARISON:  06/02/2015 FINDINGS: There is a a right chest wall port a catheter with tip in the projection of the SVC. The heart size appears normal. No pleural effusion or edema. Bilateral nodular densities are again noted compatible with lung metastases. IMPRESSION: 1. No acute cardiopulmonary abnormalities. 2. Pulmonary metastasis. Electronically Signed   By: Kerby Moors M.D.    On: 09/06/2015 19:58   Ct Chest W Contrast  08/25/2015  CLINICAL DATA:  59 year old male with history of rectal cancer diagnosed in 2015, with renal metastasis diagnosed in 2016, as well as lung metastases and bone metastases. Ongoing chemotherapy. Increased colostomy output. Diffuse pain. EXAM: CT CHEST, ABDOMEN, AND PELVIS WITH CONTRAST TECHNIQUE: Multidetector CT imaging of the chest, abdomen and pelvis was  performed following the standard protocol during bolus administration of intravenous contrast. CONTRAST:  24mL ISOVUE-300 IOPAMIDOL (ISOVUE-300) INJECTION 61% COMPARISON:  CT of the chest, abdomen and pelvis 06/02/2015. FINDINGS: CT CHEST FINDINGS Mediastinum/Lymph Nodes: Heart size is normal. There is no significant pericardial fluid, thickening or pericardial calcification. There is atherosclerosis of the thoracic aorta, the great vessels of the mediastinum and the coronary arteries, including calcified atherosclerotic plaque in the left anterior descending coronary artery. Mildly enlarged low right paratracheal lymph node measuring 1 cm in short axis. Enlarged low left paratracheal lymph node measuring 1 cm in short axis. No hilar lymphadenopathy. Esophagus is unremarkable in appearance. No axillary lymphadenopathy. Lungs/Pleura: Evaluation of the lung parenchyma is limited by considerable patient respiratory motion on today's examination. Previously noted pulmonary nodules in the right middle lobe appear slightly smaller and less distinct than the prior study, largest of which measures 8 mm on today's examination (image 83 of series 8), suggesting some positive response to therapy. Previously noted nodule in the base of the left lower lobe currently measures 13 x 9 mm (image 74 of series 8) and is similar to the prior examination. Previously noted nodules in the right lung base are now obscured by considerable airspace consolidation dependently, and extensive peribronchovascular micronodularity which  is present throughout all aspects of the lung bases bilaterally, but is most severe in the right lower lobe. Many of the right lower lobe bronchi in the basal segments appear completely opacified by retained secretions. Similar findings are present to a lesser degree in the left lower lobe. This patchy peribronchovascular micronodularity is present throughout all aspects of the lungs bilaterally extending into the lung apices, but does have a basal predominance where there is also some associated septal thickening and very mild septal nodularity. Trace right pleural effusion lying dependently. Musculoskeletal/Soft Tissues: There are no aggressive appearing lytic or blastic lesions noted in the visualized portions of the skeleton. CT ABDOMEN AND PELVIS FINDINGS Hepatobiliary: In the inferior aspect of segment 6 of the liver there appears to be direct invasion of the hepatic parenchyma from the patient's large right renal metastasis. This is best appreciated on coronal image 59 of series 604. No other suspicious cystic or solid hepatic lesions are noted. No intra or extrahepatic biliary ductal dilatation. Diffuse periportal edema. Gallbladder is moderately distended. However, there are no gallstones, and no pericholecystic fluid or surrounding inflammatory changes to suggest an acute cholecystitis at this time. Pancreas: No pancreatic mass. No pancreatic ductal dilatation. No pancreatic or peripancreatic fluid or inflammatory changes. Spleen: Unremarkable. Adrenals/Urinary Tract: Bilateral adrenal glands and the left kidney are normal in appearance. Again noted are multiple infiltrative masses within the right kidney related to known metastatic disease. On today's study this currently measures approximately 13.5 x 9.0 x 13.0 cm (slightly smaller than the prior examination), and is highly invasive into adjacent structures, including the under surface of segment 6 of the liver and posteriorly into the right psoas,  quadratus lumborum and right flank musculature. Status post radical cystectomy with ileal conduit. Stomach/Bowel: The appearance of the stomach is normal. There multiple borderline dilated loops of small bowel demonstrating air-fluid levels. These measure up to 3.8 cm in diameter. Status post low anterior resection. Left lower quadrant colostomy. Large amount of liquid stool in the colon. Vascular/Lymphatic: Atherosclerosis throughout the abdominal and pelvic vasculature, without evidence of aneurysm or dissection. Multiple borderline enlarged retroperitoneal lymph nodes are again noted, but there are no definite pathologically enlarged lymph nodes identified in the  abdomen or pelvis. Reproductive: Status post radical prostatectomy. Other: Soft tissue thickening in the presacral space and along the right pelvic sidewall, similar to prior examinations, presumably post treatment related. Trace volume of ascites. No pneumoperitoneum. Mild diffuse mesenteric edema. Musculoskeletal: Again noted is some osteolysis and associated soft tissue thickening of the coccyx, which currently measures up to 4.4 x 4.1 cm. Whether or not this is from an osseous metastasis or is related to chronic osteomyelitis is uncertain, but this appears to have progressed slightly compared to the prior examination. IMPRESSION: 1. Today's study demonstrates some positive response to therapy with slight decreased size of the highly infiltrative right renal mass. This mass continues to show direct invasion of segment 6 of the liver, the right psoas muscle, right quadratus lumborum and other posterior right-sided flank musculature. 2. Previously noted pulmonary nodules appear generally stable to slightly decreased compared to the prior examination, however, assessment is limited on today's study given the widespread peribronchovascular nodularity seen on today's examination. Although the appearance of today's study favors infectious/inflammatory  etiologies, potentially related to significant aspiration, the possibility of developing areas of lymphangitic spread of disease are not excluded, particularly in the lung bases. 3. Borderline dilated loops of small bowel with multiple air-fluid levels. In addition, there is a large volume of liquid stool throughout the colon. Findings do not suggest obstruction, but may indicate an enteritis. 4. Enlarging area of soft tissue thickening and osteolysis in the coccyx may reflect metastatic disease, or may indicate chronic osteomyelitis. 5. New trace right pleural effusion. 6. Additional findings, as above, similar to prior examinations. Electronically Signed   By: Vinnie Langton M.D.   On: 08/25/2015 11:17   Dg Chest Port 1 View  08/28/2015  CLINICAL DATA:  Respiratory failure EXAM: PORTABLE CHEST 1 VIEW COMPARISON:  Portable chest x-ray of August 27, 2015 FINDINGS: The lungs are adequately inflated. The trachea and esophagus have been extubated. The interstitial markings are coarse at the lung bases greatest on the right. There is no discrete alveolar infiltrate. There is no pleural effusion or pneumothorax. The heart and pulmonary vascularity are normal. The power port appliance tip projects over the distal third of the SVC. IMPRESSION: Interval extubation of the trachea and esophagus. Stable stable mild bibasilar atelectasis. Electronically Signed   By: Roben Tatsch  [place] M.D.   On: 08/28/2015 07:12   Portable Chest Xray  08/27/2015  CLINICAL DATA:  Respiratory failure. EXAM: PORTABLE CHEST 1 VIEW COMPARISON:  CT 08/25/2015.  Chest x-ray 08/22/2015. FINDINGS: Endotracheal tube, NG tube, right IJ line stable position. Heart size normal. No focal infiltrate. No pleural effusion or pneumothorax. IMPRESSION: 1.  Lines and tubes in stable position. 2. Persistent mild bibasilar atelectasis and infiltrates. Reference is made to prior CT report 08/25/2015. Electronically Signed   By: Marcello Moores  Register   On: 08/27/2015  07:26   Dg Chest Port 1 View  08/25/2015  CLINICAL DATA:  Endotracheal tube placement.  Initial encounter. EXAM: PORTABLE CHEST 1 VIEW COMPARISON:  Chest radiograph performed 09/09/2015 FINDINGS: The patient's endotracheal tube is seen ending 5 cm above the carina. An enteric tube is noted coiling within the stomach. A right-sided chest port is noted ending about the cavoatrial junction. Patchy right basilar airspace opacity may reflect pneumonia or asymmetric interstitial edema. No pleural effusion or pneumothorax is seen. The cardiomediastinal silhouette is normal in size. No acute osseous abnormalities are identified. IMPRESSION: 1. Endotracheal tube seen ending 5 cm above the carina. 2. Patchy right basilar airspace opacity  may reflect pneumonia or asymmetric interstitial edema. Electronically Signed   By: Garald Balding M.D.   On: 08/25/2015 23:56         Subjective: patient unresponsive. Appears comfortable on morphine drip.no respiratory distress, vomiting, uncontrolled pain  Objective: Filed Vitals:   08/28/15 1207 08/28/15 1209 08/28/15 1431 08/29/15 0555  BP: 108/84  94/69 106/80  Pulse:  101 95 131  Temp:   97.3 F (36.3 C) 98.4 F (36.9 C)  TempSrc:   Axillary Oral  Resp:   14 18  Height:      Weight:      SpO2:  97% 100% 94%    Intake/Output Summary (Last 24 hours) at 08/29/15 1300 Last data filed at 08/29/15 1209  Gross per 24 hour  Intake 893.55 ml  Output    475 ml  Net 418.55 ml   Weight change:  Exam:   General:  Emaciated, not in acute distress  HEENT: No icterusNo neck mass, [place]/AT  Cardiovascular: RRR, S1/S2, no rubs, no gallops  Respiratory: bilateral rhonchi.  Abdomen: Soft/+BS, non distended, no guarding  Extremities: No edema, No lymphangitis, No petechiae, No rashes, no synovitis  Data Reviewed: Basic Metabolic Panel:  Recent Labs Lab 08/25/15 0551 08/26/15 0311 08/27/15 0440 08/27/15 2055 08/28/15 0640 08/29/15 0500  NA 143 145  146*  --  144 147*  K 3.5 3.7 2.7* 2.7* 4.1 4.1  CL 116* 117* 107  --  108 108  CO2 18* 17* 30  --  29 30  GLUCOSE 115* 171* 128*  --  154* 151*  BUN 12 15 15   --  13 13  CREATININE 1.05 1.14 1.00  --  0.91 0.88  CALCIUM 8.4* 8.6* 7.6*  --  7.3* 8.0*  MG  --   --  1.3* 1.9 1.7  --   PHOS  --   --  1.3* 1.9* 3.1  --    Liver Function Tests: No results for input(s): AST, ALT, ALKPHOS, BILITOT, PROT, ALBUMIN in the last 168 hours. No results for input(s): LIPASE, AMYLASE in the last 168 hours. No results for input(s): AMMONIA in the last 168 hours. CBC:  Recent Labs Lab 08/25/15 0551 08/26/15 0311 08/27/15 0440 08/28/15 0640 08/29/15 0500  WBC 12.0* 16.1* 10.5 14.5* 23.2*  HGB 8.4* 8.5* 8.0* 8.2* 8.4*  HCT 27.0* 27.8* 25.7* 26.2* 27.6*  MCV 84.4 84.8 83.7 84.5 85.4  PLT 392 332 288 283 351   Cardiac Enzymes: No results for input(s): CKTOTAL, CKMB, CKMBINDEX, TROPONINI in the last 168 hours. BNP: Invalid input(s): POCBNP CBG:  Recent Labs Lab 08/27/15 1117 08/27/15 1554 08/27/15 1955 08/27/15 2351 08/28/15 0749  GLUCAP 166* 139* 150* 162* 126*    Recent Results (from the past 240 hour(s))  Culture, Urine     Status: Abnormal   Collection Time: 09/06/2015  6:00 PM  Result Value Ref Range Status   Specimen Description URINE, RANDOM  Final   Special Requests NONE  Final   Culture >=100,000 COLONIES/mL YEAST (A)  Final   Report Status 08/21/2015 FINAL  Final  C difficile quick scan w PCR reflex     Status: None   Collection Time: 08/15/2015  6:00 PM  Result Value Ref Range Status   C Diff antigen NEGATIVE NEGATIVE Final   C Diff toxin NEGATIVE NEGATIVE Final   C Diff interpretation Negative for toxigenic C. difficile  Final  Gastrointestinal Panel by PCR , Stool     Status: None   Collection  Time: 08/26/2015  6:00 PM  Result Value Ref Range Status   Campylobacter species NOT DETECTED NOT DETECTED Final   Plesimonas shigelloides NOT DETECTED NOT DETECTED Final    Salmonella species NOT DETECTED NOT DETECTED Final   Yersinia enterocolitica NOT DETECTED NOT DETECTED Final   Vibrio species NOT DETECTED NOT DETECTED Final   Vibrio cholerae NOT DETECTED NOT DETECTED Final   Enteroaggregative E coli (EAEC) NOT DETECTED NOT DETECTED Final   Enteropathogenic E coli (EPEC) NOT DETECTED NOT DETECTED Final   Enterotoxigenic E coli (ETEC) NOT DETECTED NOT DETECTED Final   Shiga like toxin producing E coli (STEC) NOT DETECTED NOT DETECTED Final   E. coli O157 NOT DETECTED NOT DETECTED Final   Shigella/Enteroinvasive E coli (EIEC) NOT DETECTED NOT DETECTED Final   Cryptosporidium NOT DETECTED NOT DETECTED Final   Cyclospora cayetanensis NOT DETECTED NOT DETECTED Final   Entamoeba histolytica NOT DETECTED NOT DETECTED Final   Giardia lamblia NOT DETECTED NOT DETECTED Final   Adenovirus F40/41 NOT DETECTED NOT DETECTED Final   Astrovirus NOT DETECTED NOT DETECTED Final   Norovirus GI/GII NOT DETECTED NOT DETECTED Final   Rotavirus A NOT DETECTED NOT DETECTED Final   Sapovirus (I, II, IV, and V) NOT DETECTED NOT DETECTED Final  Culture, blood (Routine X 2) w Reflex to ID Panel     Status: None   Collection Time: 08/29/2015  7:06 PM  Result Value Ref Range Status   Specimen Description BLOOD RIGHT ARM  Final   Special Requests BOTTLES DRAWN AEROBIC AND ANAEROBIC 10CC EA  Final   Culture   Final    NO GROWTH 5 DAYS Performed at [facility]    Report Status 08/25/2015 FINAL  Final  Culture, blood (Routine X 2) w Reflex to ID Panel     Status: None   Collection Time: 09/03/2015  7:06 PM  Result Value Ref Range Status   Specimen Description BLOOD LEFT ARM  Final   Special Requests BOTTLES DRAWN AEROBIC AND ANAEROBIC [place] EA  Final   Culture   Final    NO GROWTH 5 DAYS Performed at [facility]    Report Status 08/25/2015 FINAL  Final  Culture, blood (routine x 2) Call MD if unable to obtain prior to antibiotics being given     Status: None  (Preliminary result)   Collection Time: 08/25/15  5:42 PM  Result Value Ref Range Status   Specimen Description BLOOD LEFT WRIST  Final   Special Requests BOTTLES DRAWN AEROBIC AND ANAEROBIC 5CC  Final   Culture   Final    NO GROWTH 3 DAYS Performed at [facility]    Report Status PENDING  Incomplete  Culture, blood (routine x 2) Call MD if unable to obtain prior to antibiotics being given     Status: None (Preliminary result)   Collection Time: 08/25/15  5:47 PM  Result Value Ref Range Status   Specimen Description BLOOD RIGHT ARM  Final   Special Requests BOTTLES DRAWN AEROBIC AND ANAEROBIC 6.5CC  Final   Culture   Final    NO GROWTH 3 DAYS Performed at [facility]    Report Status PENDING  Incomplete  MRSA PCR Screening     Status: None   Collection Time: 08/26/15 12:30 AM  Result Value Ref Range Status   MRSA by PCR NEGATIVE NEGATIVE Final    Comment:        The GeneXpert MRSA Assay (FDA approved for NASAL  specimens only), is one component of a comprehensive MRSA colonization surveillance program. It is not intended to diagnose MRSA infection nor to guide or monitor treatment for MRSA infections.   Culture, respiratory (NON-Expectorated)     Status: None   Collection Time: 08/26/15  8:05 AM  Result Value Ref Range Status   Specimen Description TRACHEAL ASPIRATE  Final   Special Requests Immunocompromised  Final   Gram Stain   Final    ABUNDANT WBC PRESENT,BOTH PMN AND MONONUCLEAR FEW SQUAMOUS EPITHELIAL CELLS PRESENT MODERATE YEAST RARE GRAM POSITIVE RODS Performed at [facility]    Culture   Final    MODERATE YEAST Performed at [facility]    Report Status 08/29/2015 FINAL  Final     Scheduled Meds: . antiseptic oral rinse  7 mL Mouth Rinse q12n4p  . chlorhexidine  15 mL Mouth Rinse BID  . sodium chloride flush  3 mL Intravenous Q12H   Continuous Infusions: . morphine 2 mg/hr (08/28/15 2058)     Eric Morganti,  DO  [facility] Pager [phone]  If 7PM-7AM, please contact night-coverage [email or web address] Password [email or web address] 08/29/2015, 1:00 PM   LOS: 9 days

## 2015-08-30 DIAGNOSIS — Z515 Encounter for palliative care: Secondary | ICD-10-CM | POA: Insufficient documentation

## 2015-08-30 LAB — CULTURE, BLOOD (ROUTINE X 2)
CULTURE: NO GROWTH
Culture: NO GROWTH

## 2015-08-30 MED ORDER — SCOPOLAMINE 1 MG/3DAYS TD PT72
1.0000 | MEDICATED_PATCH | TRANSDERMAL | Status: DC
  Administered 2015-08-30: 1.5 mg via TRANSDERMAL
  Filled 2015-08-30: qty 1

## 2015-08-30 NOTE — Progress Notes (Signed)
Daily Progress Note   Patient Name: Grant [name]       Date: 08/30/2015 DOB: 30-Jun-1955  Age: 60 y.o. MRN#: [id number] Attending Physician: Orson Eva, MD Primary Care Physician: No primary care provider on file. Admit Date: 09/10/2015  Reason for Consultation/Follow-up: Establishing goals of care  Subjective: I met today with patient's long-term girlfriend [name].   She reports that he has appeared comfortable overall.  He had 2 periods of increased SOB that quickly resolved with morphine bolus.  Length of Stay: 10 days  Current Medications: Scheduled Meds:  . antiseptic oral rinse  7 mL Mouth Rinse q12n4p  . chlorhexidine  15 mL Mouth Rinse BID  . scopolamine  1 patch Transdermal Q72H  . sodium chloride flush  3 mL Intravenous Q12H    Continuous Infusions: . morphine 5 mg/hr (08/30/15 1939)    PRN Meds: sodium chloride, acetaminophen **OR** acetaminophen, glycopyrrolate, haloperidol lactate, ipratropium, levalbuterol, lidocaine-prilocaine, LORazepam, morphine, ondansetron **OR** ondansetron (ZOFRAN) IV, prochlorperazine, sodium chloride flush  Physical Exam: Physical Exam  General: Does not respond to verbal or tactile stimulation, in NAD. Cardiovascular: RRR, no M/R/G.  Resp: Increased periods of apnea noted. Abdomen: BS x 4, soft, NT/ND. Musculoskeletal: No gross deformities, no edema.  Skin: Intact, warm, no rashes.             Vital Signs: BP 104/74 mmHg  Pulse 111  Temp(Src) 101.3 F (38.5 C) (Axillary)  Resp 9  Ht 6' 6"  (1.981 m)  Wt 67.5 kg (148 lb 13 oz)  BMI 17.20 kg/m2  SpO2 97% SpO2: SpO2: 97 % O2 Device: O2 Device: Nasal Cannula O2 Flow Rate: O2 Flow Rate (L/min): 2 L/min  Intake/output summary:   Intake/Output Summary (Last 24 hours) at  08/30/15 2050 Last data filed at 08/30/15 1806  Gross per 24 hour  Intake      0 ml  Output    800 ml  Net   -800 ml   LBM: Last BM Date: 08/30/15 Baseline Weight: Weight: 59.5 kg (131 lb 2.8 oz) Most recent weight: Weight: 67.5 kg (148 lb 13 oz)       Palliative Assessment/Data: Flowsheet Rows        Most Recent Value   Intake Tab    Referral Department  Pulmonary   Unit at Time of Referral  ICU   Palliative Care Primary Diagnosis  Cancer   Date Notified  08/25/15   Palliative Care Type  New Palliative care   Date of Admission  08/18/2015   Date first seen by Palliative Care  08/26/15   # of days Palliative referral response time  1 Day(s)   # of days IP prior to Palliative referral  5   Clinical Assessment    Palliative Performance Scale Score  10%   Pain Max last 24 hours  Not able to report   Pain Min Last 24 hours  Not able to report   Psychosocial & Spiritual Assessment    Palliative Care Outcomes    Patient/Family meeting held?  Yes   Who was at the meeting?  Long term girlfriend/POA   Palliative Care Outcomes  Clarified goals of care      Additional Data Reviewed: CBC    Component Value Date/Time   WBC 23.2* 08/29/2015 0500   WBC 0.5* 08/24/2015 1438   RBC 3.23* 08/29/2015 0500   RBC 3.64* 08/22/2015 1438   RBC 2.58* 05/14/2014 0730   HGB 8.4* 08/29/2015 0500   HGB 9.6* 08/29/2015 1438   HCT 27.6* 08/29/2015 0500   HCT 29.1* 08/18/2015 1438   PLT 351 08/29/2015 0500   PLT 277 08/26/2015 1438   MCV 85.4 08/29/2015 0500   MCV 79.9 09/12/2015 1438   MCH 26.0 08/29/2015 0500   MCH 26.4* 09/07/2015 1438   MCHC 30.4 08/29/2015 0500   MCHC 33.0 09/03/2015 1438   RDW 19.4* 08/29/2015 0500   RDW 17.1* 08/29/2015 1438   LYMPHSABS 0.4* 08/22/2015 0502   LYMPHSABS 0.1* 09/13/2015 1438   MONOABS 0.1 08/22/2015 0502   MONOABS 0.1 09/10/2015 1438   EOSABS 0.0 08/22/2015 0502   EOSABS 0.0 09/05/2015 1438   BASOSABS 0.0 08/22/2015 0502   BASOSABS 0.0 08/28/2015  1438    CMP     Component Value Date/Time   NA 147* 08/29/2015 0500   NA 134* 08/19/2015 1438   K 4.1 08/29/2015 0500   K 4.1 09/08/2015 1438   CL 108 08/29/2015 0500   CO2 30 08/29/2015 0500   CO2 25 08/19/2015 1438   GLUCOSE 151* 08/29/2015 0500   GLUCOSE 127 08/24/2015 1438   BUN 13 08/29/2015 0500   BUN 17.8 08/24/2015 1438   CREATININE 0.88 08/29/2015 0500   CREATININE 1.3 08/26/2015 1438   CALCIUM 8.0* 08/29/2015 0500   CALCIUM 9.5 08/16/2015 1438   PROT 6.4 08/23/2015 1438   PROT 6.2 05/13/2014 1700   ALBUMIN 2.9* 08/27/2015 1438   ALBUMIN 2.7* 05/13/2014 1700   AST 9 09/05/2015 1438   AST 18 05/13/2014 1700   ALT <9 09/05/2015 1438   ALT 12 05/13/2014 1700   ALKPHOS 171* 08/29/2015 1438   ALKPHOS 74 05/13/2014 1700   BILITOT 0.88 09/11/2015 1438   BILITOT 0.6 05/13/2014 1700   GFRNONAA >60 08/29/2015 0500   GFRAA >60 08/29/2015 0500       Problem List:  Patient Active Problem List   Diagnosis Date Noted  . Acute respiratory failure ([place])   . Encounter for intubation   . Aspiration pneumonia ([place]): Probable 08/25/2015  . SOB (shortness of breath)   . Candiduria 08/22/2015  . UTI (urinary tract infection) 08/21/2015  . Ulcer of sacral region 08/24/2015  . Neutropenia ([place]) 08/25/2015  . FTT (failure to thrive) in adult   . Rectal cancer metastasized to lung ([place]) 04/21/2015  . Abdominal abscess ([place])   .  Renal abscess, right   . Renal cyst 05/13/2014  . Neoplasm related pain 05/13/2014  . Nausea without vomiting 05/13/2014  . Other malaise and fatigue 01/28/2014  . Pain in male perineum 01/28/2014  . Chewing tobacco nicotine dependence without complication 74/16/3845  . CA of rectum ([place]) 12/15/2013  . Dehydration 12/10/2013  . Diarrhea 12/10/2013  . Hyponatremia 12/10/2013  . Rectal cancer ([place]) 11/08/2013  . 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 Ureteral obstruction, right 10/10/2013  . Anemia of chronic disease 10/10/2013  . Severe  protein-calorie malnutrition ([place]) 10/10/2013  . Microcytic anemia 10/10/2013  . Rectal mass 10/10/2013     Palliative Care Assessment & Plan    1.Code Status:  DNR    Code Status Orders        Start     Ordered   08/27/15 2140  Do not attempt resuscitation (DNR)   Continuous    Question Answer Comment  In the event of cardiac or respiratory ARREST Do not call a "code blue"   In the event of cardiac or respiratory ARREST Do not perform Intubation, CPR, defibrillation or ACLS   In the event of cardiac or respiratory ARREST Use medication by any route, position, wound care, and other measures to relive pain and suffering. May use oxygen, suction and manual treatment of airway obstruction as needed for comfort.   Comments No Vasopressors for BP support      08/27/15 2139    Code Status History    Date Active Date Inactive Code Status Order ID Comments User Context   09/11/2015  5:42 PM 08/27/2015  9:39 PM Full Code [id number]  Eugenie Filler, MD Inpatient   05/15/2014 12:33 PM 05/22/2014  3:44 PM Full Code [id number]  Jacqulynn Cadet, MD Inpatient   05/13/2014  4:50 PM 05/15/2014 12:33 PM Full Code [id number]  [name], MD Inpatient   03/12/2014  3:50 PM 03/13/2014  3:39 AM Full Code [id number]  Azzie Roup, MD [place]   12/10/2013 10:44 PM 12/13/2013  4:36 AM Full Code [id number]  Berle Mull, MD ED   10/18/2013  5:32 PM 10/20/2013  6:25 PM Full Code [id number]  Markus Daft, MD Inpatient   10/13/2013  7:39 AM 10/18/2013  5:32 PM Full Code [id number]  Sandi Mariscal, MD Inpatient   10/09/2013  8:58 PM 10/13/2013  7:39 AM Full Code [id number]  [name], MD Inpatient   10/09/2013  7:15 PM 10/09/2013  8:58 PM Full Code [id number]  Shanda Howells, MD ED    Advance Directive Documentation        Most Recent Value   Type of Advance Directive  Healthcare Power of Attorney, Living will   Pre-existing out of facility DNR order (yellow form or pink MOST form)     "MOST" Form in Place?         2.  Goals of Care/Additional Recommendations:  -  I met with Mr. Wheatley girlfriend/POA.  She reports feeling at peace as she feels that we "gave him every chance, but he was starting to suffer and it was time."     - Plan for comfort care moving forward. He is actively dying.   3. Symptom Management:      1. Pain/dyspnea: Continue morphine.  He is currently on continuous infusion and resting comfortably.      2. Anxiety: Ativan as needed      3. Agitation: Haldol as needed      4. Excess secretions: Robinul as needed  4. Palliative Prophylaxis:   Aspiration, Delirium Protocol and Frequent Pain Assessment  5. Prognosis: Hours - Days.    6. Discharge Planning:  Anticipated Hospital Death most likely.  If his condition were to stabilize over the next day or so, will begin discussion about transition to residential hospice. I doubt that he will become stable enough to consider this option.   Care plan was discussed with patient's POA and bedside RN   Thank you for allowing the Palliative Medicine Team to assist in the care of this patient.   Time In: 1605 Time Out: 1625 Total Time 20 Prolonged Time Billed no        Micheline Rough, MD  08/30/2015, 8:50 PM  Please contact Palliative Medicine Team phone at [phone] for questions and concerns.

## 2015-08-30 NOTE — Progress Notes (Signed)
PROGRESS NOTE  Torao Casper [id number] DOB: [birth year] DOA: 08/28/2015 PCP: No primary care provider on file. Hospital Summary 60 y.o. male with history of metastatic rectal cancer currently on salvage chemotherapy, microcytic anemia, status post colostomy and urostomy who completed another cycle of chemotherapy (FOLFIRI/panitumumab) on 08/11/2015 who presents to oncologist office with ongoing diarrhea which developed on the day of chemotherapy that has persisted despite Imodium or Lomotil with pain in the sacrum region and in both legs. Patient noted to be significantly weak with decreased oral intake however drinking fluids. He received Neulasta 08/13/2015. The patient was noted to have yeast in his urine with significant pyuria. He was started on fluconazole. C. Difficile assay and GI pathogen panel has been negative. The patient was started on octreotide 08/22/15 by Dr. Benay Spice in hopes to slow his diarrhea. He was continued on imodium and lomotil. He was continued on MS Contin,oxycodone,and alprazolam. He was on intravenous morphine for break through pain. On the evening of 08/25/2015, the patient developed respiratory distress with oxygen saturation in the 50s. The patient was intubated. It was felt that the patient likely had an aspiration event followed by hypoxia and change in his mental status. It was thought that his opioids in conjunction with his non-anion gapped metabolic acidosis (NAG) may have contributed to his clinical decline. As result, his alprazolam, morphine, and oxycodone were discontinued. Critical care medicine assumed care from that point forward. 08/25/2015 CT of the chest to suggest a possible aspiration. Due to his respiratory failure and CT findings, the patient was started initially on Zosyn and vancomycin. This was narrowed to Zosyn. The patient was subsequently extubated on 08/27/2015, and his care was transferred to [facility] service on 08/28/15. Although the  patient's power of attorney struggled initially with the patient's declining medical condition, she ultimately decided to transition the patient to focus on comfort care after multiple discussions with palliative medicine.  The patient was started on a morphine drip. He did not have any further distress or uncontrolled pain. Assessment/Plan: Acute respiratory failure with hypoxia -secondary to aspiration pneumonitis in the setting of opioid use and NAG -continue Zosyn day #4-->augmentin -Wean oxygen as tolerated -Pulmonary hygiene -during the night on 08/28/2015, the patient's POA decided to change the patient's focus of care to full comfort. The patient was started on a morphine drip. -no longer active issue as to patient's focus of care has been transitioned to that focused on comfort  Metastatic rectal CA, s/p cycle 5 FOLFIRI / panitumumab 08/11/15 -followed by Dr. Benay Spice -4/11/2017CT abdomen and pelvis showed enlarging soft tissue thickened area with osteolysis in the coccyx -here was also borderline dilated loops of bowel with air-fluid levels concerning for possible enteritis. There was large volume stool in the colon. -abdomen also shows direct invasion of the liver, right psoas muscle, right quadratus lumborum and other posterior right-sided flank musculature. Pulmonary nodules appear stable however widespread  -patient remains full code--prognosis poor -CT of the chest with concerns of possible aspiration pneumonia versus lymphangitic spread -no longer active issue as to patient's focus of care has been transitioned to that focused on comfort  Failure to thrive/deconditioning/severe malnutrition -Nutritional supplementation once able to take oral intake  Diarrhea -Suspect Enteritis secondary to chemotherapy -octreotide helped-->d/c by PCCM on 4/14 (4/19-->4/14) -monitor off octreotide --no longer active issue as to patient's focus of care has been transitioned to that  focused on comfort  Candida UTI -Continue fluconazole (4/8-->4/14) -no longer  active issue as to patient's focus of care has been transitioned to that focused on comfort  Dehydration/AKI -secondary to GI losses -continue IVF -serum creatinine improved  ulcer of the sacral region -Minimize opioids and hypnotics -Continue wound care. -present at time of admission -unstageable per WOCN  History of right hydronephrosis/right renal abscess/status post placement of right ureteral stent  Stable. Good urine output. Follow.    Family Communication: Significant other updated at beside 4/15care Disposition Plan: expect hospital death   Consultants:  Oncology: Dr. Benay Spice 08/27/2015  PCCM  Palliative  Antibiotics: Vanc 04/11 >4/13 Zosyn 04/11 >4/14 Fluconazole 04/08 >4/14 Augmentin 4/14>>>4/14   Procedures/Studies: Ct Abdomen Pelvis W Wo Contrast  08/25/2015  CLINICAL DATA:  60 year old male with history of rectal cancer diagnosed in 2015, with renal metastasis diagnosed in 2016, as well as lung metastases and bone metastases. Ongoing chemotherapy. Increased colostomy output. Diffuse pain. EXAM: CT CHEST, ABDOMEN, AND PELVIS WITH CONTRAST TECHNIQUE: Multidetector CT imaging of the chest, abdomen and pelvis was performed following the standard protocol during bolus administration of intravenous contrast. CONTRAST:  70mL ISOVUE-300 IOPAMIDOL (ISOVUE-300) INJECTION 61% COMPARISON:  CT of the chest, abdomen and pelvis 06/02/2015. FINDINGS: CT CHEST FINDINGS Mediastinum/Lymph Nodes: Heart size is normal. There is no significant pericardial fluid, thickening or pericardial calcification. There is atherosclerosis of the thoracic aorta, the great vessels of the mediastinum and the coronary arteries, including calcified atherosclerotic plaque in the left anterior descending coronary artery. Mildly enlarged low right paratracheal lymph node measuring 1 cm in short axis. Enlarged low left  paratracheal lymph node measuring 1 cm in short axis. No hilar lymphadenopathy. Esophagus is unremarkable in appearance. No axillary lymphadenopathy. Lungs/Pleura: Evaluation of the lung parenchyma is limited by considerable patient respiratory motion on today's examination. Previously noted pulmonary nodules in the right middle lobe appear slightly smaller and less distinct than the prior study, largest of which measures 8 mm on today's examination (image 83 of series 8), suggesting some positive response to therapy. Previously noted nodule in the base of the left lower lobe currently measures 13 x 9 mm (image 74 of series 8) and is similar to the prior examination. Previously noted nodules in the right lung base are now obscured by considerable airspace consolidation dependently, and extensive peribronchovascular micronodularity which is present throughout all aspects of the lung bases bilaterally, but is most severe in the right lower lobe. Many of the right lower lobe bronchi in the basal segments appear completely opacified by retained secretions. Similar findings are present to a lesser degree in the left lower lobe. This patchy peribronchovascular micronodularity is present throughout all aspects of the lungs bilaterally extending into the lung apices, but does have a basal predominance where there is also some associated septal thickening and very mild septal nodularity. Trace right pleural effusion lying dependently. Musculoskeletal/Soft Tissues: There are no aggressive appearing lytic or blastic lesions noted in the visualized portions of the skeleton. CT ABDOMEN AND PELVIS FINDINGS Hepatobiliary: In the inferior aspect of segment 6 of the liver there appears to be direct invasion of the hepatic parenchyma from the patient's large right renal metastasis. This is best appreciated on coronal image 59 of series 604. No other suspicious cystic or solid hepatic lesions are noted. No intra or extrahepatic  biliary ductal dilatation. Diffuse periportal edema. Gallbladder is moderately distended. However, there are no gallstones, and no pericholecystic fluid or surrounding inflammatory changes to suggest an acute cholecystitis at this time. Pancreas: No pancreatic mass. No pancreatic ductal  dilatation. No pancreatic or peripancreatic fluid or inflammatory changes. Spleen: Unremarkable. Adrenals/Urinary Tract: Bilateral adrenal glands and the left kidney are normal in appearance. Again noted are multiple infiltrative masses within the right kidney related to known metastatic disease. On today's study this currently measures approximately 13.5 x 9.0 x 13.0 cm (slightly smaller than the prior examination), and is highly invasive into adjacent structures, including the under surface of segment 6 of the liver and posteriorly into the right psoas, quadratus lumborum and right flank musculature. Status post radical cystectomy with ileal conduit. Stomach/Bowel: The appearance of the stomach is normal. There multiple borderline dilated loops of small bowel demonstrating air-fluid levels. These measure up to 3.8 cm in diameter. Status post low anterior resection. Left lower quadrant colostomy. Large amount of liquid stool in the colon. Vascular/Lymphatic: Atherosclerosis throughout the abdominal and pelvic vasculature, without evidence of aneurysm or dissection. Multiple borderline enlarged retroperitoneal lymph nodes are again noted, but there are no definite pathologically enlarged lymph nodes identified in the abdomen or pelvis. Reproductive: Status post radical prostatectomy. Other: Soft tissue thickening in the presacral space and along the right pelvic sidewall, similar to prior examinations, presumably post treatment related. Trace volume of ascites. No pneumoperitoneum. Mild diffuse mesenteric edema. Musculoskeletal: Again noted is some osteolysis and associated soft tissue thickening of the coccyx, which currently  measures up to 4.4 x 4.1 cm. Whether or not this is from an osseous metastasis or is related to chronic osteomyelitis is uncertain, but this appears to have progressed slightly compared to the prior examination. IMPRESSION: 1. Today's study demonstrates some positive response to therapy with slight decreased size of the highly infiltrative right renal mass. This mass continues to show direct invasion of segment 6 of the liver, the right psoas muscle, right quadratus lumborum and other posterior right-sided flank musculature. 2. Previously noted pulmonary nodules appear generally stable to slightly decreased compared to the prior examination, however, assessment is limited on today's study given the widespread peribronchovascular nodularity seen on today's examination. Although the appearance of today's study favors infectious/inflammatory etiologies, potentially related to significant aspiration, the possibility of developing areas of lymphangitic spread of disease are not excluded, particularly in the lung bases. 3. Borderline dilated loops of small bowel with multiple air-fluid levels. In addition, there is a large volume of liquid stool throughout the colon. Findings do not suggest obstruction, but may indicate an enteritis. 4. Enlarging area of soft tissue thickening and osteolysis in the coccyx may reflect metastatic disease, or may indicate chronic osteomyelitis. 5. New trace right pleural effusion. 6. Additional findings, as above, similar to prior examinations. Electronically Signed   By: Vinnie Langton M.D.   On: 08/25/2015 11:17   X-ray Chest Pa And Lateral  09/07/2015  CLINICAL DATA:  Shortness of breath.  Rectal cancer EXAM: CHEST  2 VIEW COMPARISON:  06/02/2015 FINDINGS: There is a a right chest wall port a catheter with tip in the projection of the SVC. The heart size appears normal. No pleural effusion or edema. Bilateral nodular densities are again noted compatible with lung metastases.  IMPRESSION: 1. No acute cardiopulmonary abnormalities. 2. Pulmonary metastasis. Electronically Signed   By: Kerby Moors M.D.   On: 09/09/2015 19:58   Ct Chest W Contrast  08/25/2015  CLINICAL DATA:  60 year old male with history of rectal cancer diagnosed in 2015, with renal metastasis diagnosed in 2016, as well as lung metastases and bone metastases. Ongoing chemotherapy. Increased colostomy output. Diffuse pain. EXAM: CT CHEST, ABDOMEN, AND PELVIS  WITH CONTRAST TECHNIQUE: Multidetector CT imaging of the chest, abdomen and pelvis was performed following the standard protocol during bolus administration of intravenous contrast. CONTRAST:  68mL ISOVUE-300 IOPAMIDOL (ISOVUE-300) INJECTION 61% COMPARISON:  CT of the chest, abdomen and pelvis 06/02/2015. FINDINGS: CT CHEST FINDINGS Mediastinum/Lymph Nodes: Heart size is normal. There is no significant pericardial fluid, thickening or pericardial calcification. There is atherosclerosis of the thoracic aorta, the great vessels of the mediastinum and the coronary arteries, including calcified atherosclerotic plaque in the left anterior descending coronary artery. Mildly enlarged low right paratracheal lymph node measuring 1 cm in short axis. Enlarged low left paratracheal lymph node measuring 1 cm in short axis. No hilar lymphadenopathy. Esophagus is unremarkable in appearance. No axillary lymphadenopathy. Lungs/Pleura: Evaluation of the lung parenchyma is limited by considerable patient respiratory motion on today's examination. Previously noted pulmonary nodules in the right middle lobe appear slightly smaller and less distinct than the prior study, largest of which measures 8 mm on today's examination (image 83 of series 8), suggesting some positive response to therapy. Previously noted nodule in the base of the left lower lobe currently measures 13 x 9 mm (image 74 of series 8) and is similar to the prior examination. Previously noted nodules in the right lung  base are now obscured by considerable airspace consolidation dependently, and extensive peribronchovascular micronodularity which is present throughout all aspects of the lung bases bilaterally, but is most severe in the right lower lobe. Many of the right lower lobe bronchi in the basal segments appear completely opacified by retained secretions. Similar findings are present to a lesser degree in the left lower lobe. This patchy peribronchovascular micronodularity is present throughout all aspects of the lungs bilaterally extending into the lung apices, but does have a basal predominance where there is also some associated septal thickening and very mild septal nodularity. Trace right pleural effusion lying dependently. Musculoskeletal/Soft Tissues: There are no aggressive appearing lytic or blastic lesions noted in the visualized portions of the skeleton. CT ABDOMEN AND PELVIS FINDINGS Hepatobiliary: In the inferior aspect of segment 6 of the liver there appears to be direct invasion of the hepatic parenchyma from the patient's large right renal metastasis. This is best appreciated on coronal image 59 of series 604. No other suspicious cystic or solid hepatic lesions are noted. No intra or extrahepatic biliary ductal dilatation. Diffuse periportal edema. Gallbladder is moderately distended. However, there are no gallstones, and no pericholecystic fluid or surrounding inflammatory changes to suggest an acute cholecystitis at this time. Pancreas: No pancreatic mass. No pancreatic ductal dilatation. No pancreatic or peripancreatic fluid or inflammatory changes. Spleen: Unremarkable. Adrenals/Urinary Tract: Bilateral adrenal glands and the left kidney are normal in appearance. Again noted are multiple infiltrative masses within the right kidney related to known metastatic disease. On today's study this currently measures approximately 13.5 x 9.0 x 13.0 cm (slightly smaller than the prior examination), and is highly  invasive into adjacent structures, including the under surface of segment 6 of the liver and posteriorly into the right psoas, quadratus lumborum and right flank musculature. Status post radical cystectomy with ileal conduit. Stomach/Bowel: The appearance of the stomach is normal. There multiple borderline dilated loops of small bowel demonstrating air-fluid levels. These measure up to 3.8 cm in diameter. Status post low anterior resection. Left lower quadrant colostomy. Large amount of liquid stool in the colon. Vascular/Lymphatic: Atherosclerosis throughout the abdominal and pelvic vasculature, without evidence of aneurysm or dissection. Multiple borderline enlarged retroperitoneal lymph nodes are again  noted, but there are no definite pathologically enlarged lymph nodes identified in the abdomen or pelvis. Reproductive: Status post radical prostatectomy. Other: Soft tissue thickening in the presacral space and along the right pelvic sidewall, similar to prior examinations, presumably post treatment related. Trace volume of ascites. No pneumoperitoneum. Mild diffuse mesenteric edema. Musculoskeletal: Again noted is some osteolysis and associated soft tissue thickening of the coccyx, which currently measures up to 4.4 x 4.1 cm. Whether or not this is from an osseous metastasis or is related to chronic osteomyelitis is uncertain, but this appears to have progressed slightly compared to the prior examination. IMPRESSION: 1. Today's study demonstrates some positive response to therapy with slight decreased size of the highly infiltrative right renal mass. This mass continues to show direct invasion of segment 6 of the liver, the right psoas muscle, right quadratus lumborum and other posterior right-sided flank musculature. 2. Previously noted pulmonary nodules appear generally stable to slightly decreased compared to the prior examination, however, assessment is limited on today's study given the widespread  peribronchovascular nodularity seen on today's examination. Although the appearance of today's study favors infectious/inflammatory etiologies, potentially related to significant aspiration, the possibility of developing areas of lymphangitic spread of disease are not excluded, particularly in the lung bases. 3. Borderline dilated loops of small bowel with multiple air-fluid levels. In addition, there is a large volume of liquid stool throughout the colon. Findings do not suggest obstruction, but may indicate an enteritis. 4. Enlarging area of soft tissue thickening and osteolysis in the coccyx may reflect metastatic disease, or may indicate chronic osteomyelitis. 5. New trace right pleural effusion. 6. Additional findings, as above, similar to prior examinations. Electronically Signed   By: Vinnie Langton M.D.   On: 08/25/2015 11:17   Dg Chest Port 1 View  08/28/2015  CLINICAL DATA:  Respiratory failure EXAM: PORTABLE CHEST 1 VIEW COMPARISON:  Portable chest x-ray of August 27, 2015 FINDINGS: The lungs are adequately inflated. The trachea and esophagus have been extubated. The interstitial markings are coarse at the lung bases greatest on the right. There is no discrete alveolar infiltrate. There is no pleural effusion or pneumothorax. The heart and pulmonary vascularity are normal. The power port appliance tip projects over the distal third of the SVC. IMPRESSION: Interval extubation of the trachea and esophagus. Stable stable mild bibasilar atelectasis. Electronically Signed   By: Avyanna Spada  [place] M.D.   On: 08/28/2015 07:12   Portable Chest Xray  08/27/2015  CLINICAL DATA:  Respiratory failure. EXAM: PORTABLE CHEST 1 VIEW COMPARISON:  CT 08/25/2015.  Chest x-ray 08/22/2015. FINDINGS: Endotracheal tube, NG tube, right IJ line stable position. Heart size normal. No focal infiltrate. No pleural effusion or pneumothorax. IMPRESSION: 1.  Lines and tubes in stable position. 2. Persistent mild bibasilar atelectasis  and infiltrates. Reference is made to prior CT report 08/25/2015. Electronically Signed   By: Marcello Moores  Register   On: 08/27/2015 07:26   Dg Chest Port 1 View  08/25/2015  CLINICAL DATA:  Endotracheal tube placement.  Initial encounter. EXAM: PORTABLE CHEST 1 VIEW COMPARISON:  Chest radiograph performed 08/19/2015 FINDINGS: The patient's endotracheal tube is seen ending 5 cm above the carina. An enteric tube is noted coiling within the stomach. A right-sided chest port is noted ending about the cavoatrial junction. Patchy right basilar airspace opacity may reflect pneumonia or asymmetric interstitial edema. No pleural effusion or pneumothorax is seen. The cardiomediastinal silhouette is normal in size. No acute osseous abnormalities are identified. IMPRESSION: 1. Endotracheal tube  seen ending 5 cm above the carina. 2. Patchy right basilar airspace opacity may reflect pneumonia or asymmetric interstitial edema. Electronically Signed   By: Garald Balding M.D.   On: 08/25/2015 23:56         Subjective: Unresponsive to Protopathic stimuli. No reports of uncontrolled pain, Spiriva distress, vomiting, diarrhea  Objective: Filed Vitals:   08/28/15 1209 08/28/15 1431 08/29/15 0555 08/30/15 0502  BP:  94/69 106/80 104/74  Pulse: 101 95 131 111  Temp:  97.3 F (36.3 C) 98.4 F (36.9 C) 101.3 F (38.5 C)  TempSrc:  Axillary Axillary Axillary  Resp:  14 18 9   Height:      Weight:      SpO2: 97% 100% 94% 97%    Intake/Output Summary (Last 24 hours) at 08/30/15 1350 Last data filed at 08/30/15 0900  Gross per 24 hour  Intake      0 ml  Output    350 ml  Net   -350 ml   Weight change:  Exam:  General:  Patient somnolent without any distress  Cardiovascular: RRR, S1/S2,  Respiratory: Scattered rhonchi   Abdomen: Soft/+BS,non distended  Extremities: No edema, No lymphangitis, No petechiae, No rashes, no synovitis  Data Reviewed: Basic Metabolic Panel:  Recent Labs Lab  08/25/15 0551 08/26/15 0311 08/27/15 0440 08/27/15 2055 08/28/15 0640 08/29/15 0500  NA 143 145 146*  --  144 147*  K 3.5 3.7 2.7* 2.7* 4.1 4.1  CL 116* 117* 107  --  108 108  CO2 18* 17* 30  --  29 30  GLUCOSE 115* 171* 128*  --  154* 151*  BUN 12 15 15   --  13 13  CREATININE 1.05 1.14 1.00  --  0.91 0.88  CALCIUM 8.4* 8.6* 7.6*  --  7.3* 8.0*  MG  --   --  1.3* 1.9 1.7  --   PHOS  --   --  1.3* 1.9* 3.1  --    Liver Function Tests: No results for input(s): AST, ALT, ALKPHOS, BILITOT, PROT, ALBUMIN in the last 168 hours. No results for input(s): LIPASE, AMYLASE in the last 168 hours. No results for input(s): AMMONIA in the last 168 hours. CBC:  Recent Labs Lab 08/25/15 0551 08/26/15 0311 08/27/15 0440 08/28/15 0640 08/29/15 0500  WBC 12.0* 16.1* 10.5 14.5* 23.2*  HGB 8.4* 8.5* 8.0* 8.2* 8.4*  HCT 27.0* 27.8* 25.7* 26.2* 27.6*  MCV 84.4 84.8 83.7 84.5 85.4  PLT 392 332 288 283 351   Cardiac Enzymes: No results for input(s): CKTOTAL, CKMB, CKMBINDEX, TROPONINI in the last 168 hours. BNP: Invalid input(s): POCBNP CBG:  Recent Labs Lab 08/27/15 1117 08/27/15 1554 08/27/15 1955 08/27/15 2351 08/28/15 0749  GLUCAP 166* 139* 150* 162* 126*    Recent Results (from the past 240 hour(s))  Culture, Urine     Status: Abnormal   Collection Time: 08/26/2015  6:00 PM  Result Value Ref Range Status   Specimen Description URINE, RANDOM  Final   Special Requests NONE  Final   Culture >=100,000 COLONIES/mL YEAST (A)  Final   Report Status 08/21/2015 FINAL  Final  C difficile quick scan w PCR reflex     Status: None   Collection Time: 08/15/2015  6:00 PM  Result Value Ref Range Status   C Diff antigen NEGATIVE NEGATIVE Final   C Diff toxin NEGATIVE NEGATIVE Final   C Diff interpretation Negative for toxigenic C. difficile  Final  Gastrointestinal Panel by PCR ,  Stool     Status: None   Collection Time: 08/27/2015  6:00 PM  Result Value Ref Range Status   Campylobacter  species NOT DETECTED NOT DETECTED Final   Plesimonas shigelloides NOT DETECTED NOT DETECTED Final   Salmonella species NOT DETECTED NOT DETECTED Final   Yersinia enterocolitica NOT DETECTED NOT DETECTED Final   Vibrio species NOT DETECTED NOT DETECTED Final   Vibrio cholerae NOT DETECTED NOT DETECTED Final   Enteroaggregative E coli (EAEC) NOT DETECTED NOT DETECTED Final   Enteropathogenic E coli (EPEC) NOT DETECTED NOT DETECTED Final   Enterotoxigenic E coli (ETEC) NOT DETECTED NOT DETECTED Final   Shiga like toxin producing E coli (STEC) NOT DETECTED NOT DETECTED Final   E. coli O157 NOT DETECTED NOT DETECTED Final   Shigella/Enteroinvasive E coli (EIEC) NOT DETECTED NOT DETECTED Final   Cryptosporidium NOT DETECTED NOT DETECTED Final   Cyclospora cayetanensis NOT DETECTED NOT DETECTED Final   Entamoeba histolytica NOT DETECTED NOT DETECTED Final   Giardia lamblia NOT DETECTED NOT DETECTED Final   Adenovirus F40/41 NOT DETECTED NOT DETECTED Final   Astrovirus NOT DETECTED NOT DETECTED Final   Norovirus GI/GII NOT DETECTED NOT DETECTED Final   Rotavirus A NOT DETECTED NOT DETECTED Final   Sapovirus (I, II, IV, and V) NOT DETECTED NOT DETECTED Final  Culture, blood (Routine X 2) w Reflex to ID Panel     Status: None   Collection Time: 09/03/2015  7:06 PM  Result Value Ref Range Status   Specimen Description BLOOD RIGHT ARM  Final   Special Requests BOTTLES DRAWN AEROBIC AND ANAEROBIC 10CC EA  Final   Culture   Final    NO GROWTH 5 DAYS Performed at [facility]    Report Status 08/25/2015 FINAL  Final  Culture, blood (Routine X 2) w Reflex to ID Panel     Status: None   Collection Time: 08/24/2015  7:06 PM  Result Value Ref Range Status   Specimen Description BLOOD LEFT ARM  Final   Special Requests BOTTLES DRAWN AEROBIC AND ANAEROBIC [place] EA  Final   Culture   Final    NO GROWTH 5 DAYS Performed at [facility]    Report Status 08/25/2015 FINAL  Final  Culture,  blood (routine x 2) Call MD if unable to obtain prior to antibiotics being given     Status: None (Preliminary result)   Collection Time: 08/25/15  5:42 PM  Result Value Ref Range Status   Specimen Description BLOOD LEFT WRIST  Final   Special Requests BOTTLES DRAWN AEROBIC AND ANAEROBIC 5CC  Final   Culture   Final    NO GROWTH 4 DAYS Performed at [facility]    Report Status PENDING  Incomplete  Culture, blood (routine x 2) Call MD if unable to obtain prior to antibiotics being given     Status: None (Preliminary result)   Collection Time: 08/25/15  5:47 PM  Result Value Ref Range Status   Specimen Description BLOOD RIGHT ARM  Final   Special Requests BOTTLES DRAWN AEROBIC AND ANAEROBIC 6.5CC  Final   Culture   Final    NO GROWTH 4 DAYS Performed at [facility]    Report Status PENDING  Incomplete  MRSA PCR Screening     Status: None   Collection Time: 08/26/15 12:30 AM  Result Value Ref Range Status   MRSA by PCR NEGATIVE NEGATIVE Final    Comment:  The GeneXpert MRSA Assay (FDA approved for NASAL specimens only), is one component of a comprehensive MRSA colonization surveillance program. It is not intended to diagnose MRSA infection nor to guide or monitor treatment for MRSA infections.   Culture, respiratory (NON-Expectorated)     Status: None   Collection Time: 08/26/15  8:05 AM  Result Value Ref Range Status   Specimen Description TRACHEAL ASPIRATE  Final   Special Requests Immunocompromised  Final   Gram Stain   Final    ABUNDANT WBC PRESENT,BOTH PMN AND MONONUCLEAR FEW SQUAMOUS EPITHELIAL CELLS PRESENT MODERATE YEAST RARE GRAM POSITIVE RODS Performed at [facility]    Culture   Final    MODERATE YEAST Performed at [facility]    Report Status 08/29/2015 FINAL  Final     Scheduled Meds: . antiseptic oral rinse  7 mL Mouth Rinse q12n4p  . chlorhexidine  15 mL Mouth Rinse BID  . sodium chloride flush  3 mL  Intravenous Q12H   Continuous Infusions: . morphine 4 mg/hr (08/30/15 1124)     Indiah Heyden, DO  [facility] Pager [phone]  If 7PM-7AM, please contact night-coverage [email or web address] Password [email or web address] 08/30/2015, 1:50 PM   LOS: 10 days

## 2015-08-31 DIAGNOSIS — D638 Anemia in other chronic diseases classified elsewhere: Secondary | ICD-10-CM

## 2015-08-31 NOTE — Progress Notes (Signed)
Nutrition Brief Note  Chart reviewed. Pt now transitioning to comfort care.  No further nutrition interventions warranted at this time.  Please re-consult as needed.   Harout Scheurich, MS, RD, LDN Pager: [phone] After Hours Pager: [phone]    

## 2015-08-31 NOTE — Progress Notes (Signed)
PROGRESS NOTE  Grant [name] [id number] DOB: [birth year] DOA: 08/19/2015 PCP: No primary care provider on file. Brief History  Hospital Summary 60 y.o. male with history of metastatic rectal cancer currently on salvage chemotherapy, microcytic anemia, status post colostomy and urostomy who completed another cycle of chemotherapy (FOLFIRI/panitumumab) on 08/11/2015 who presents to oncologist office with ongoing diarrhea which developed on the day of chemotherapy that has persisted despite Imodium or Lomotil with pain in the sacrum region and in both legs. Patient noted to be significantly weak with decreased oral intake however drinking fluids. He received Neulasta 08/13/2015. The patient was noted to have yeast in his urine with significant pyuria. He was started on fluconazole. C. Difficile assay and GI pathogen panel has been negative. The patient was started on octreotide 08/22/15 by Dr. Benay Spice in hopes to slow his diarrhea. He was continued on imodium and lomotil. He was continued on MS Contin,oxycodone,and alprazolam. He was on intravenous morphine for break through pain. On the evening of 08/25/2015, the patient developed respiratory distress with oxygen saturation in the 50s. The patient was intubated. It was felt that the patient likely had an aspiration event followed by hypoxia and change in his mental status. It was thought that his opioids in conjunction with his non-anion gapped metabolic acidosis (NAG) may have contributed to his clinical decline. As result, his alprazolam, morphine, and oxycodone were discontinued. Critical care medicine assumed care from that point forward. 08/25/2015 CT of the chest to suggest a possible aspiration. Due to his respiratory failure and CT findings, the patient was started initially on Zosyn and vancomycin. This was narrowed to Zosyn. The patient was subsequently extubated on 08/27/2015, and his care was transferred to [facility] service on  08/28/15. Although the patient's power of attorney struggled initially with the patient's declining medical condition, she ultimately decided to transition the patient to focus on comfort care after multiple discussions with palliative medicine. The patient was started on a morphine drip. He did not have any further distress or uncontrolled pain. Assessment/Plan: Acute respiratory failure with hypoxia -secondary to aspiration pneumonitis in the setting of opioid use and NAG -continue Zosyn day #4-->augmentin -Wean oxygen as tolerated -Pulmonary hygiene -during the night on 08/28/2015, the patient's POA decided to change the patient's focus of care to full comfort. The patient was started on a morphine drip. -no longer active issue as to patient's focus of care has been transitioned to that focused on comfort  Metastatic rectal CA, s/p cycle 5 FOLFIRI / panitumumab 08/11/15 -followed by Dr. Benay Spice -4/11/2017CT abdomen and pelvis showed enlarging soft tissue thickened area with osteolysis in the coccyx -here was also borderline dilated loops of bowel with air-fluid levels concerning for possible enteritis. There was large volume stool in the colon. -abdomen also shows direct invasion of the liver, right psoas muscle, right quadratus lumborum and other posterior right-sided flank musculature. Pulmonary nodules appear stable however widespread  -patient remains full code--prognosis poor -CT of the chest with concerns of possible aspiration pneumonia versus lymphangitic spread -no longer active issue as to patient's focus of care has been transitioned to that focused on comfort  Failure to thrive/deconditioning/severe malnutrition -Nutritional supplementation once able to take oral intake  Diarrhea -Suspect Enteritis secondary to chemotherapy -octreotide helped-->d/c by PCCM on 4/14 (4/19-->4/14) -monitor off octreotide --no longer active issue as to patient's focus of care has been  transitioned to that focused on comfort  Candida UTI -Continue fluconazole (4/8-->4/14) -  no longer active issue as to patient's focus of care has been transitioned to that focused on comfort  Dehydration/AKI -secondary to GI losses -continue IVF -serum creatinine improved  ulcer of the sacral region -Minimize opioids and hypnotics -Continue wound care. -present at time of admission -unstageable per WOCN  History of right hydronephrosis/right renal abscess/status post placement of right ureteral stent  Stable. Good urine output. Follow.    Family Communication: Significant other updated at beside 4/15care Disposition Plan: expect hospital death--he is actively dying   Consultants:  Oncology: Dr. Benay Spice 08/30/2015  PCCM  Palliative  Antibiotics: Vanc 04/11 >4/13 Zosyn 04/11 >4/14 Fluconazole 04/08 >4/14 Augmentin 4/14>>>4/14    Procedures/Studies: Ct Abdomen Pelvis W Wo Contrast  08/25/2015  CLINICAL DATA:  60 year old male with history of rectal cancer diagnosed in 2015, with renal metastasis diagnosed in 2016, as well as lung metastases and bone metastases. Ongoing chemotherapy. Increased colostomy output. Diffuse pain. EXAM: CT CHEST, ABDOMEN, AND PELVIS WITH CONTRAST TECHNIQUE: Multidetector CT imaging of the chest, abdomen and pelvis was performed following the standard protocol during bolus administration of intravenous contrast. CONTRAST:  34mL ISOVUE-300 IOPAMIDOL (ISOVUE-300) INJECTION 61% COMPARISON:  CT of the chest, abdomen and pelvis 06/02/2015. FINDINGS: CT CHEST FINDINGS Mediastinum/Lymph Nodes: Heart size is normal. There is no significant pericardial fluid, thickening or pericardial calcification. There is atherosclerosis of the thoracic aorta, the great vessels of the mediastinum and the coronary arteries, including calcified atherosclerotic plaque in the left anterior descending coronary artery. Mildly enlarged low right paratracheal lymph node  measuring 1 cm in short axis. Enlarged low left paratracheal lymph node measuring 1 cm in short axis. No hilar lymphadenopathy. Esophagus is unremarkable in appearance. No axillary lymphadenopathy. Lungs/Pleura: Evaluation of the lung parenchyma is limited by considerable patient respiratory motion on today's examination. Previously noted pulmonary nodules in the right middle lobe appear slightly smaller and less distinct than the prior study, largest of which measures 8 mm on today's examination (image 83 of series 8), suggesting some positive response to therapy. Previously noted nodule in the base of the left lower lobe currently measures 13 x 9 mm (image 74 of series 8) and is similar to the prior examination. Previously noted nodules in the right lung base are now obscured by considerable airspace consolidation dependently, and extensive peribronchovascular micronodularity which is present throughout all aspects of the lung bases bilaterally, but is most severe in the right lower lobe. Many of the right lower lobe bronchi in the basal segments appear completely opacified by retained secretions. Similar findings are present to a lesser degree in the left lower lobe. This patchy peribronchovascular micronodularity is present throughout all aspects of the lungs bilaterally extending into the lung apices, but does have a basal predominance where there is also some associated septal thickening and very mild septal nodularity. Trace right pleural effusion lying dependently. Musculoskeletal/Soft Tissues: There are no aggressive appearing lytic or blastic lesions noted in the visualized portions of the skeleton. CT ABDOMEN AND PELVIS FINDINGS Hepatobiliary: In the inferior aspect of segment 6 of the liver there appears to be direct invasion of the hepatic parenchyma from the patient's large right renal metastasis. This is best appreciated on coronal image 59 of series 604. No other suspicious cystic or solid hepatic  lesions are noted. No intra or extrahepatic biliary ductal dilatation. Diffuse periportal edema. Gallbladder is moderately distended. However, there are no gallstones, and no pericholecystic fluid or surrounding inflammatory changes to suggest an acute cholecystitis at this time. Pancreas:  No pancreatic mass. No pancreatic ductal dilatation. No pancreatic or peripancreatic fluid or inflammatory changes. Spleen: Unremarkable. Adrenals/Urinary Tract: Bilateral adrenal glands and the left kidney are normal in appearance. Again noted are multiple infiltrative masses within the right kidney related to known metastatic disease. On today's study this currently measures approximately 13.5 x 9.0 x 13.0 cm (slightly smaller than the prior examination), and is highly invasive into adjacent structures, including the under surface of segment 6 of the liver and posteriorly into the right psoas, quadratus lumborum and right flank musculature. Status post radical cystectomy with ileal conduit. Stomach/Bowel: The appearance of the stomach is normal. There multiple borderline dilated loops of small bowel demonstrating air-fluid levels. These measure up to 3.8 cm in diameter. Status post low anterior resection. Left lower quadrant colostomy. Large amount of liquid stool in the colon. Vascular/Lymphatic: Atherosclerosis throughout the abdominal and pelvic vasculature, without evidence of aneurysm or dissection. Multiple borderline enlarged retroperitoneal lymph nodes are again noted, but there are no definite pathologically enlarged lymph nodes identified in the abdomen or pelvis. Reproductive: Status post radical prostatectomy. Other: Soft tissue thickening in the presacral space and along the right pelvic sidewall, similar to prior examinations, presumably post treatment related. Trace volume of ascites. No pneumoperitoneum. Mild diffuse mesenteric edema. Musculoskeletal: Again noted is some osteolysis and associated soft tissue  thickening of the coccyx, which currently measures up to 4.4 x 4.1 cm. Whether or not this is from an osseous metastasis or is related to chronic osteomyelitis is uncertain, but this appears to have progressed slightly compared to the prior examination. IMPRESSION: 1. Today's study demonstrates some positive response to therapy with slight decreased size of the highly infiltrative right renal mass. This mass continues to show direct invasion of segment 6 of the liver, the right psoas muscle, right quadratus lumborum and other posterior right-sided flank musculature. 2. Previously noted pulmonary nodules appear generally stable to slightly decreased compared to the prior examination, however, assessment is limited on today's study given the widespread peribronchovascular nodularity seen on today's examination. Although the appearance of today's study favors infectious/inflammatory etiologies, potentially related to significant aspiration, the possibility of developing areas of lymphangitic spread of disease are not excluded, particularly in the lung bases. 3. Borderline dilated loops of small bowel with multiple air-fluid levels. In addition, there is a large volume of liquid stool throughout the colon. Findings do not suggest obstruction, but may indicate an enteritis. 4. Enlarging area of soft tissue thickening and osteolysis in the coccyx may reflect metastatic disease, or may indicate chronic osteomyelitis. 5. New trace right pleural effusion. 6. Additional findings, as above, similar to prior examinations. Electronically Signed   By: Vinnie Langton M.D.   On: 08/25/2015 11:17   X-ray Chest Pa And Lateral  09/04/2015  CLINICAL DATA:  Shortness of breath.  Rectal cancer EXAM: CHEST  2 VIEW COMPARISON:  06/02/2015 FINDINGS: There is a a right chest wall port a catheter with tip in the projection of the SVC. The heart size appears normal. No pleural effusion or edema. Bilateral nodular densities are again noted  compatible with lung metastases. IMPRESSION: 1. No acute cardiopulmonary abnormalities. 2. Pulmonary metastasis. Electronically Signed   By: Kerby Moors M.D.   On: 08/21/2015 19:58   Ct Chest W Contrast  08/25/2015  CLINICAL DATA:  60 year old male with history of rectal cancer diagnosed in 2015, with renal metastasis diagnosed in 2016, as well as lung metastases and bone metastases. Ongoing chemotherapy. Increased colostomy output. Diffuse pain.  EXAM: CT CHEST, ABDOMEN, AND PELVIS WITH CONTRAST TECHNIQUE: Multidetector CT imaging of the chest, abdomen and pelvis was performed following the standard protocol during bolus administration of intravenous contrast. CONTRAST:  52mL ISOVUE-300 IOPAMIDOL (ISOVUE-300) INJECTION 61% COMPARISON:  CT of the chest, abdomen and pelvis 06/02/2015. FINDINGS: CT CHEST FINDINGS Mediastinum/Lymph Nodes: Heart size is normal. There is no significant pericardial fluid, thickening or pericardial calcification. There is atherosclerosis of the thoracic aorta, the great vessels of the mediastinum and the coronary arteries, including calcified atherosclerotic plaque in the left anterior descending coronary artery. Mildly enlarged low right paratracheal lymph node measuring 1 cm in short axis. Enlarged low left paratracheal lymph node measuring 1 cm in short axis. No hilar lymphadenopathy. Esophagus is unremarkable in appearance. No axillary lymphadenopathy. Lungs/Pleura: Evaluation of the lung parenchyma is limited by considerable patient respiratory motion on today's examination. Previously noted pulmonary nodules in the right middle lobe appear slightly smaller and less distinct than the prior study, largest of which measures 8 mm on today's examination (image 83 of series 8), suggesting some positive response to therapy. Previously noted nodule in the base of the left lower lobe currently measures 13 x 9 mm (image 74 of series 8) and is similar to the prior examination. Previously  noted nodules in the right lung base are now obscured by considerable airspace consolidation dependently, and extensive peribronchovascular micronodularity which is present throughout all aspects of the lung bases bilaterally, but is most severe in the right lower lobe. Many of the right lower lobe bronchi in the basal segments appear completely opacified by retained secretions. Similar findings are present to a lesser degree in the left lower lobe. This patchy peribronchovascular micronodularity is present throughout all aspects of the lungs bilaterally extending into the lung apices, but does have a basal predominance where there is also some associated septal thickening and very mild septal nodularity. Trace right pleural effusion lying dependently. Musculoskeletal/Soft Tissues: There are no aggressive appearing lytic or blastic lesions noted in the visualized portions of the skeleton. CT ABDOMEN AND PELVIS FINDINGS Hepatobiliary: In the inferior aspect of segment 6 of the liver there appears to be direct invasion of the hepatic parenchyma from the patient's large right renal metastasis. This is best appreciated on coronal image 59 of series 604. No other suspicious cystic or solid hepatic lesions are noted. No intra or extrahepatic biliary ductal dilatation. Diffuse periportal edema. Gallbladder is moderately distended. However, there are no gallstones, and no pericholecystic fluid or surrounding inflammatory changes to suggest an acute cholecystitis at this time. Pancreas: No pancreatic mass. No pancreatic ductal dilatation. No pancreatic or peripancreatic fluid or inflammatory changes. Spleen: Unremarkable. Adrenals/Urinary Tract: Bilateral adrenal glands and the left kidney are normal in appearance. Again noted are multiple infiltrative masses within the right kidney related to known metastatic disease. On today's study this currently measures approximately 13.5 x 9.0 x 13.0 cm (slightly smaller than the  prior examination), and is highly invasive into adjacent structures, including the under surface of segment 6 of the liver and posteriorly into the right psoas, quadratus lumborum and right flank musculature. Status post radical cystectomy with ileal conduit. Stomach/Bowel: The appearance of the stomach is normal. There multiple borderline dilated loops of small bowel demonstrating air-fluid levels. These measure up to 3.8 cm in diameter. Status post low anterior resection. Left lower quadrant colostomy. Large amount of liquid stool in the colon. Vascular/Lymphatic: Atherosclerosis throughout the abdominal and pelvic vasculature, without evidence of aneurysm or dissection. Multiple borderline  enlarged retroperitoneal lymph nodes are again noted, but there are no definite pathologically enlarged lymph nodes identified in the abdomen or pelvis. Reproductive: Status post radical prostatectomy. Other: Soft tissue thickening in the presacral space and along the right pelvic sidewall, similar to prior examinations, presumably post treatment related. Trace volume of ascites. No pneumoperitoneum. Mild diffuse mesenteric edema. Musculoskeletal: Again noted is some osteolysis and associated soft tissue thickening of the coccyx, which currently measures up to 4.4 x 4.1 cm. Whether or not this is from an osseous metastasis or is related to chronic osteomyelitis is uncertain, but this appears to have progressed slightly compared to the prior examination. IMPRESSION: 1. Today's study demonstrates some positive response to therapy with slight decreased size of the highly infiltrative right renal mass. This mass continues to show direct invasion of segment 6 of the liver, the right psoas muscle, right quadratus lumborum and other posterior right-sided flank musculature. 2. Previously noted pulmonary nodules appear generally stable to slightly decreased compared to the prior examination, however, assessment is limited on today's  study given the widespread peribronchovascular nodularity seen on today's examination. Although the appearance of today's study favors infectious/inflammatory etiologies, potentially related to significant aspiration, the possibility of developing areas of lymphangitic spread of disease are not excluded, particularly in the lung bases. 3. Borderline dilated loops of small bowel with multiple air-fluid levels. In addition, there is a large volume of liquid stool throughout the colon. Findings do not suggest obstruction, but may indicate an enteritis. 4. Enlarging area of soft tissue thickening and osteolysis in the coccyx may reflect metastatic disease, or may indicate chronic osteomyelitis. 5. New trace right pleural effusion. 6. Additional findings, as above, similar to prior examinations. Electronically Signed   By: Vinnie Langton M.D.   On: 08/25/2015 11:17   Dg Chest Port 1 View  08/28/2015  CLINICAL DATA:  Respiratory failure EXAM: PORTABLE CHEST 1 VIEW COMPARISON:  Portable chest x-ray of August 27, 2015 FINDINGS: The lungs are adequately inflated. The trachea and esophagus have been extubated. The interstitial markings are coarse at the lung bases greatest on the right. There is no discrete alveolar infiltrate. There is no pleural effusion or pneumothorax. The heart and pulmonary vascularity are normal. The power port appliance tip projects over the distal third of the SVC. IMPRESSION: Interval extubation of the trachea and esophagus. Stable stable mild bibasilar atelectasis. Electronically Signed   By: Delinda Malan  [place] M.D.   On: 08/28/2015 07:12   Portable Chest Xray  08/27/2015  CLINICAL DATA:  Respiratory failure. EXAM: PORTABLE CHEST 1 VIEW COMPARISON:  CT 08/25/2015.  Chest x-ray 08/21/2015. FINDINGS: Endotracheal tube, NG tube, right IJ line stable position. Heart size normal. No focal infiltrate. No pleural effusion or pneumothorax. IMPRESSION: 1.  Lines and tubes in stable position. 2. Persistent  mild bibasilar atelectasis and infiltrates. Reference is made to prior CT report 08/25/2015. Electronically Signed   By: Marcello Moores  Register   On: 08/27/2015 07:26   Dg Chest Port 1 View  08/25/2015  CLINICAL DATA:  Endotracheal tube placement.  Initial encounter. EXAM: PORTABLE CHEST 1 VIEW COMPARISON:  Chest radiograph performed 09/03/2015 FINDINGS: The patient's endotracheal tube is seen ending 5 cm above the carina. An enteric tube is noted coiling within the stomach. A right-sided chest port is noted ending about the cavoatrial junction. Patchy right basilar airspace opacity may reflect pneumonia or asymmetric interstitial edema. No pleural effusion or pneumothorax is seen. The cardiomediastinal silhouette is normal in size. No acute osseous abnormalities  are identified. IMPRESSION: 1. Endotracheal tube seen ending 5 cm above the carina. 2. Patchy right basilar airspace opacity may reflect pneumonia or asymmetric interstitial edema. Electronically Signed   By: Garald Balding M.D.   On: 08/25/2015 23:56         Subjective: Patient is comfortable. No reported respiratory distress, uncontrolled pain, vomiting, diarrhea  Objective: Filed Vitals:   08/28/15 1209 08/28/15 1431 08/29/15 0555 08/30/15 0502  BP:  94/69 106/80 104/74  Pulse: 101 95 131 111  Temp:  97.3 F (36.3 C) 98.4 F (36.9 C) 101.3 F (38.5 C)  TempSrc:  Axillary Axillary Axillary  Resp:  14 18 9   Height:      Weight:      SpO2: 97% 100% 94% 97%    Intake/Output Summary (Last 24 hours) at 08/31/15 1830 Last data filed at 08/31/15 0630  Gross per 24 hour  Intake      0 ml  Output    325 ml  Net   -325 ml   Weight change:  Exam:   General:  Pt is not in acute distress  Cardiovascular: RRR, S1/S2, no rubs, no gallops  Respiratory: Agonal respirations with scattered rhonchi  Extremities: No edema, No lymphangitis, No petechiae, No rashes, no synovitis  Data Reviewed: Basic Metabolic Panel:  Recent  Labs Lab 08/25/15 0551 08/26/15 0311 08/27/15 0440 08/27/15 2055 08/28/15 0640 08/29/15 0500  NA 143 145 146*  --  144 147*  K 3.5 3.7 2.7* 2.7* 4.1 4.1  CL 116* 117* 107  --  108 108  CO2 18* 17* 30  --  29 30  GLUCOSE 115* 171* 128*  --  154* 151*  BUN 12 15 15   --  13 13  CREATININE 1.05 1.14 1.00  --  0.91 0.88  CALCIUM 8.4* 8.6* 7.6*  --  7.3* 8.0*  MG  --   --  1.3* 1.9 1.7  --   PHOS  --   --  1.3* 1.9* 3.1  --    Liver Function Tests: No results for input(s): AST, ALT, ALKPHOS, BILITOT, PROT, ALBUMIN in the last 168 hours. No results for input(s): LIPASE, AMYLASE in the last 168 hours. No results for input(s): AMMONIA in the last 168 hours. CBC:  Recent Labs Lab 08/25/15 0551 08/26/15 0311 08/27/15 0440 08/28/15 0640 08/29/15 0500  WBC 12.0* 16.1* 10.5 14.5* 23.2*  HGB 8.4* 8.5* 8.0* 8.2* 8.4*  HCT 27.0* 27.8* 25.7* 26.2* 27.6*  MCV 84.4 84.8 83.7 84.5 85.4  PLT 392 332 288 283 351   Cardiac Enzymes: No results for input(s): CKTOTAL, CKMB, CKMBINDEX, TROPONINI in the last 168 hours. BNP: Invalid input(s): POCBNP CBG:  Recent Labs Lab 08/27/15 1117 08/27/15 1554 08/27/15 1955 08/27/15 2351 08/28/15 0749  GLUCAP 166* 139* 150* 162* 126*    Recent Results (from the past 240 hour(s))  Culture, blood (routine x 2) Call MD if unable to obtain prior to antibiotics being given     Status: None   Collection Time: 08/25/15  5:42 PM  Result Value Ref Range Status   Specimen Description BLOOD LEFT WRIST  Final   Special Requests BOTTLES DRAWN AEROBIC AND ANAEROBIC 5CC  Final   Culture   Final    NO GROWTH 5 DAYS Performed at [facility]    Report Status 08/30/2015 FINAL  Final  Culture, blood (routine x 2) Call MD if unable to obtain prior to antibiotics being given     Status: None  Collection Time: 08/25/15  5:47 PM  Result Value Ref Range Status   Specimen Description BLOOD RIGHT ARM  Final   Special Requests BOTTLES DRAWN AEROBIC AND  ANAEROBIC 6.5CC  Final   Culture   Final    NO GROWTH 5 DAYS Performed at [facility]    Report Status 08/30/2015 FINAL  Final  MRSA PCR Screening     Status: None   Collection Time: 08/26/15 12:30 AM  Result Value Ref Range Status   MRSA by PCR NEGATIVE NEGATIVE Final    Comment:        The GeneXpert MRSA Assay (FDA approved for NASAL specimens only), is one component of a comprehensive MRSA colonization surveillance program. It is not intended to diagnose MRSA infection nor to guide or monitor treatment for MRSA infections.   Culture, respiratory (NON-Expectorated)     Status: None   Collection Time: 08/26/15  8:05 AM  Result Value Ref Range Status   Specimen Description TRACHEAL ASPIRATE  Final   Special Requests Immunocompromised  Final   Gram Stain   Final    ABUNDANT WBC PRESENT,BOTH PMN AND MONONUCLEAR FEW SQUAMOUS EPITHELIAL CELLS PRESENT MODERATE YEAST RARE GRAM POSITIVE RODS Performed at [facility]    Culture   Final    MODERATE YEAST Performed at [facility]    Report Status 08/29/2015 FINAL  Final     Scheduled Meds: . antiseptic oral rinse  7 mL Mouth Rinse q12n4p  . chlorhexidine  15 mL Mouth Rinse BID  . scopolamine  1 patch Transdermal Q72H  . sodium chloride flush  3 mL Intravenous Q12H   Continuous Infusions: . morphine 5 mg/hr (08/30/15 1939)     Judithann Villamar, DO  [facility] Pager [phone]  If 7PM-7AM, please contact night-coverage [email or web address] Password [email or web address] 08/31/2015, 6:30 PM   LOS: 11 days

## 2015-09-01 DIAGNOSIS — D708 Other neutropenia: Secondary | ICD-10-CM

## 2015-09-01 MED ORDER — SODIUM CHLORIDE 0.9% FLUSH: 10.0000 mL | INTRAVENOUS | Status: DC | PRN

## 2015-09-14 NOTE — Progress Notes (Addendum)
On leader rounds patient noted to have 0 respirations.  Verified lack of respirations and pulse with second Rn Jenetta Downer.  Pronounced at 0855.  Dr. Carles Collet paged. 66ml of Morphine 1:1 infusion wasted in sink witnessed by both RNs

## 2015-09-14 NOTE — Progress Notes (Signed)
Daily Progress Note   Patient Name: Grant [name]       Date: 09/30/2015 DOB: 05-17-1955  Age: 60 y.o. MRN#: [id number] Attending Physician: Orson Eva, MD Primary Care Physician: No primary care provider on file. Admit Date: 09/02/2015  Reason for Consultation/Follow-up: Establishing goals of care  Subjective: I met today with patient's long-term girlfriend [name].   She reports that he has appeared comfortable overall.   Length of Stay: 12 days  Current Medications: Scheduled Meds:  . antiseptic oral rinse  7 mL Mouth Rinse q12n4p  . chlorhexidine  15 mL Mouth Rinse BID  . scopolamine  1 patch Transdermal Q72H  . sodium chloride flush  3 mL Intravenous Q12H    Continuous Infusions: . morphine 5 mg/hr (08/30/15 1939)    PRN Meds: sodium chloride, acetaminophen **OR** acetaminophen, glycopyrrolate, haloperidol lactate, ipratropium, levalbuterol, lidocaine-prilocaine, LORazepam, morphine, ondansetron **OR** ondansetron (ZOFRAN) IV, prochlorperazine, sodium chloride flush  Physical Exam: Physical Exam  General: Does not respond to verbal or tactile stimulation, in NAD. Cardiovascular: RRR, no M/R/G.  Resp: Increased periods of apnea noted. Abdomen: BS x 4, soft, NT/ND. Musculoskeletal: No gross deformities, no edema.  Skin: Intact, warm, no rashes.             Vital Signs: BP 104/74 mmHg  Pulse 111  Temp(Src) 101.3 F (38.5 C) (Axillary)  Resp 9  Ht 6' 6"  (1.981 m)  Wt 67.5 kg (148 lb 13 oz)  BMI 17.20 kg/m2  SpO2 97% SpO2: SpO2: 97 % O2 Device: O2 Device: Nasal Cannula O2 Flow Rate: O2 Flow Rate (L/min): 2 L/min  Intake/output summary:   Intake/Output Summary (Last 24 hours) at Sep 30, 2015 0024 Last data filed at 08/31/15 0630  Gross per 24 hour  Intake       0 ml  Output    325 ml  Net   -325 ml   LBM: Last BM Date: 08/30/15 Baseline Weight: Weight: 59.5 kg (131 lb 2.8 oz) Most recent weight: Weight: 67.5 kg (148 lb 13 oz)       Palliative Assessment/Data: Flowsheet Rows        Most Recent Value   Intake Tab    Referral Department  Pulmonary   Unit at Time of Referral  ICU   Palliative Care Primary Diagnosis  Cancer   Date  Notified  08/25/15   Palliative Care Type  New Palliative care   Date of Admission  09/08/2015   Date first seen by Palliative Care  08/26/15   # of days Palliative referral response time  1 Day(s)   # of days IP prior to Palliative referral  5   Clinical Assessment    Palliative Performance Scale Score  10%   Pain Max last 24 hours  Not able to report   Pain Min Last 24 hours  Not able to report   Psychosocial & Spiritual Assessment    Palliative Care Outcomes    Patient/Family meeting held?  Yes   Who was at the meeting?  Long term girlfriend/POA   Palliative Care Outcomes  Clarified goals of care      Additional Data Reviewed: CBC    Component Value Date/Time   WBC 23.2* 08/29/2015 0500   WBC 0.5* 09/13/2015 1438   RBC 3.23* 08/29/2015 0500   RBC 3.64* 08/19/2015 1438   RBC 2.58* 05/14/2014 0730   HGB 8.4* 08/29/2015 0500   HGB 9.6* 08/26/2015 1438   HCT 27.6* 08/29/2015 0500   HCT 29.1* 08/17/2015 1438   PLT 351 08/29/2015 0500   PLT 277 09/05/2015 1438   MCV 85.4 08/29/2015 0500   MCV 79.9 08/27/2015 1438   MCH 26.0 08/29/2015 0500   MCH 26.4* 09/05/2015 1438   MCHC 30.4 08/29/2015 0500   MCHC 33.0 08/17/2015 1438   RDW 19.4* 08/29/2015 0500   RDW 17.1* 08/22/2015 1438   LYMPHSABS 0.4* 08/22/2015 0502   LYMPHSABS 0.1* 08/24/2015 1438   MONOABS 0.1 08/22/2015 0502   MONOABS 0.1 08/23/2015 1438   EOSABS 0.0 08/22/2015 0502   EOSABS 0.0 09/09/2015 1438   BASOSABS 0.0 08/22/2015 0502   BASOSABS 0.0 09/02/2015 1438    CMP     Component Value Date/Time   NA 147* 08/29/2015 0500   NA  134* 09/04/2015 1438   K 4.1 08/29/2015 0500   K 4.1 09/05/2015 1438   CL 108 08/29/2015 0500   CO2 30 08/29/2015 0500   CO2 25 08/27/2015 1438   GLUCOSE 151* 08/29/2015 0500   GLUCOSE 127 08/19/2015 1438   BUN 13 08/29/2015 0500   BUN 17.8 09/03/2015 1438   CREATININE 0.88 08/29/2015 0500   CREATININE 1.3 09/03/2015 1438   CALCIUM 8.0* 08/29/2015 0500   CALCIUM 9.5 08/20/2015 1438   PROT 6.4 09/13/2015 1438   PROT 6.2 05/13/2014 1700   ALBUMIN 2.9* 09/13/2015 1438   ALBUMIN 2.7* 05/13/2014 1700   AST 9 08/23/2015 1438   AST 18 05/13/2014 1700   ALT <9 08/23/2015 1438   ALT 12 05/13/2014 1700   ALKPHOS 171* 09/12/2015 1438   ALKPHOS 74 05/13/2014 1700   BILITOT 0.88 09/03/2015 1438   BILITOT 0.6 05/13/2014 1700   GFRNONAA >60 08/29/2015 0500   GFRAA >60 08/29/2015 0500       Problem List:  Patient Active Problem List   Diagnosis Date Noted  . Palliative care encounter   . Terminal care   . Acute respiratory failure ([place])   . Encounter for intubation   . Aspiration pneumonia ([place]): Probable 08/25/2015  . SOB (shortness of breath)   . Candiduria 08/22/2015  . UTI (urinary tract infection) 08/21/2015  . Ulcer of sacral region 08/19/2015  . Neutropenia ([place]) 09/10/2015  . FTT (failure to thrive) in adult   . 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 Abdominal abscess ([place])   . Renal  abscess, right   . Renal cyst 05/13/2014  . Neoplasm related pain 05/13/2014  . Nausea without vomiting 05/13/2014  . Other malaise and fatigue 01/28/2014  . Pain in male perineum 01/28/2014  . Chewing tobacco nicotine dependence without complication 42/59/5638  . CA of rectum ([place]) 12/15/2013  . Dehydration 12/10/2013  . Diarrhea 12/10/2013  . Hyponatremia 12/10/2013  . Rectal cancer ([place]) 11/08/2013  . 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 Ureteral obstruction, right 10/10/2013  . Anemia of chronic disease 10/10/2013  . Severe protein-calorie malnutrition ([place])  10/10/2013  . Microcytic anemia 10/10/2013  . Rectal mass 10/10/2013     Palliative Care Assessment & Plan    1.Code Status:  DNR    Code Status Orders        Start     Ordered   08/27/15 2140  Do not attempt resuscitation (DNR)   Continuous    Question Answer Comment  In the event of cardiac or respiratory ARREST Do not call a "code blue"   In the event of cardiac or respiratory ARREST Do not perform Intubation, CPR, defibrillation or ACLS   In the event of cardiac or respiratory ARREST Use medication by any route, position, wound care, and other measures to relive pain and suffering. May use oxygen, suction and manual treatment of airway obstruction as needed for comfort.   Comments No Vasopressors for BP support      08/27/15 2139    Code Status History    Date Active Date Inactive Code Status Order ID Comments User Context   08/24/2015  5:42 PM 08/27/2015  9:39 PM Full Code [id number]  Eugenie Filler, MD Inpatient   05/15/2014 12:33 PM 05/22/2014  3:44 PM Full Code [id number]  Jacqulynn Cadet, MD Inpatient   05/13/2014  4:50 PM 05/15/2014 12:33 PM Full Code [id number]  [name], MD Inpatient   03/12/2014  3:50 PM 03/13/2014  3:39 AM Full Code [id number]  Azzie Roup, MD [place]   12/10/2013 10:44 PM 12/13/2013  4:36 AM Full Code [id number]  Berle Mull, MD ED   10/18/2013  5:32 PM 10/20/2013  6:25 PM Full Code [id number]  Markus Daft, MD Inpatient   10/13/2013  7:39 AM 10/18/2013  5:32 PM Full Code [id number]  Sandi Mariscal, MD Inpatient   10/09/2013  8:58 PM 10/13/2013  7:39 AM Full Code [id number]  [name], MD Inpatient   10/09/2013  7:15 PM 10/09/2013  8:58 PM Full Code [id number]  Shanda Howells, MD ED    Advance Directive Documentation        Most Recent Value   Type of Advance Directive  Healthcare Power of Attorney, Living will   Pre-existing out of facility DNR order (yellow form or pink MOST form)     "MOST" Form in Place?         2. Goals of Care/Additional  Recommendations:  -  I met with Mr. Shin girlfriend/POA.  She reports feeling at peace as she feels that we "gave him every chance, but he was starting to suffer and it was time."     - Plan for comfort care moving forward. He is actively dying.   3. Symptom Management:      1. Pain/dyspnea: Continue morphine.  He is currently on continuous infusion and resting comfortably.      2. Anxiety: Ativan as needed      3. Agitation: Haldol as needed      4. Excess secretions: Robinul as needed  4.  Palliative Prophylaxis:   Aspiration, Delirium Protocol and Frequent Pain Assessment  5. Prognosis: Hours - Days.    6. Discharge Planning: Anticipated Hospital Death. He is actively dying.   Care plan was discussed with patient's POA and bedside RN   Thank you for allowing the Palliative Medicine Team to assist in the care of this patient.   Time In: 0830 Time Out: 0850 Total Time 20 Prolonged Time Billed no        Micheline Rough, MD  Sep 28, 2015, 12:24 AM  Please contact Palliative Medicine Team phone at [phone] for questions and concerns.

## 2015-09-14 NOTE — Discharge Summary (Addendum)
Death Summary  Gerlad Bigelow [id number] DOB: 1955-06-09 DOA: 09/15/2015  PCP: No primary care provider on file.  Admit date: 09/15/2015 Date of Death: Sep 27, 2015 Notification: No primary care provider on file. notified of death of 09-27-2015  Final Diagnoses:  Acute respiratory failure with hypoxia -secondary to aspiration pneumonitis in the setting of opioid use and NAG -continue Zosyn day #4-->augmentin -Wean oxygen as tolerated -Pulmonary hygiene -during the night on 08/28/2015, the patient's POA decided to change the patient's focus of care to full comfort. The patient was started on a morphine drip. -no longer active issue as to patient's focus of care has been transitioned to that focused on comfort  Metastatic rectal CA, s/p cycle 5 FOLFIRI / panitumumab 08/11/15 -followed by Dr. Benay Spice -4/11/2017CT abdomen and pelvis showed enlarging soft tissue thickened area with osteolysis in the coccyx -here was also borderline dilated loops of bowel with air-fluid levels concerning for possible enteritis. There was large volume stool in the colon. -abdomen also shows direct invasion of the liver, right psoas muscle, right quadratus lumborum and other posterior right-sided flank musculature. Pulmonary nodules appear stable however widespread  -patient remains full code--prognosis poor -CT of the chest with concerns of possible aspiration pneumonia versus lymphangitic spread -no longer active issue as to patient's focus of care has been transitioned to that focused on comfort  Failure to thrive/deconditioning/severe malnutrition -Nutritional supplementation once able to take oral intake  Diarrhea -Suspect Enteritis secondary to chemotherapy -octreotide helped-->d/c by PCCM on 4/14 (4/19-->4/14) -monitor off octreotide -no longer active issue as to patient's focus of care has been transitioned to that focused on comfort  Candida UTI -Continue fluconazole (4/8-->4/14) -no  longer active issue as to patient's focus of care has been transitioned to that focused on comfort  Dehydration/AKI -secondary to GI losses -continue IVF -serum creatinine improved -no longer active issue as to patient's focus of care has been transitioned to that focused on comfort  ulcer of the sacral region -Minimize opioids and hypnotics -Continue wound care. -present at time of admission -unstageable per WOCN  History of right hydronephrosis/right renal abscess/status post placement of right ureteral stent  Stable. Good urine output. Follow.  History of present illness:  60 y.o. male with history of metastatic rectal cancer currently on salvage chemotherapy, microcytic anemia, status post colostomy and urostomy who completed another cycle of chemotherapy (FOLFIRI/panitumumab) on 08/11/2015 who presents to oncologist office with ongoing diarrhea which developed on the day of chemotherapy that has persisted despite Imodium or Lomotil with pain in the sacrum region and in both legs. Patient noted to be significantly weak with decreased oral intake however drinking fluids. He received Neulasta 08/13/2015. The patient was noted to have yeast in his urine with significant pyuria. He was started on fluconazole. C. Difficile assay and GI pathogen panel has been negative. The patient was started on octreotide 08/22/15 by Dr. Benay Spice in hopes to slow his diarrhea. He was continued on imodium and lomotil. He was continued on MS Contin,oxycodone,and alprazolam. He was on intravenous morphine for break through pain. On the evening of 08/25/2015, the patient developed respiratory distress with oxygen saturation in the 50s. The patient was intubated. It was felt that the patient likely had an aspiration event followed by hypoxia and change in his mental status. It was thought that his opioids in conjunction with his non-anion gapped metabolic acidosis (NAG) may have contributed to his clinical  decline. As result, his alprazolam, morphine, and oxycodone were discontinued. Critical care medicine assumed care  from that point forward. 08/25/2015 CT of the chest to suggest a possible aspiration. Due to his respiratory failure and CT findings, the patient was started initially on Zosyn and vancomycin. This was narrowed to Zosyn. The patient was subsequently extubated on 08/27/2015, and his care was transferred to [facility] service on 08/28/15. Although the patient's power of attorney struggled initially with the patient's declining medical condition, she ultimately decided to transition the patient to focus on comfort care after multiple discussions with palliative medicine. The patient was started on a morphine drip. He did not have any further distress or uncontrolled pain. The patient ultimately died/expired on Sep 16, 2015 at 0855.  I examined the patient and there was no spontaneous respirations, no heart tones auscultated, no response to protopathic and epicritic stimuli , no pulses palpated, and pupils were fixed and dilated and nonreactive.    The results of significant diagnostics from this hospitalization (including imaging, microbiology, ancillary and laboratory) are listed below for reference.    Significant Diagnostic Studies: Ct Abdomen Pelvis W Wo Contrast  08/25/2015  CLINICAL DATA:  60 year old male with history of rectal cancer diagnosed in 2015, with renal metastasis diagnosed in 2016, as well as lung metastases and bone metastases. Ongoing chemotherapy. Increased colostomy output. Diffuse pain. EXAM: CT CHEST, ABDOMEN, AND PELVIS WITH CONTRAST TECHNIQUE: Multidetector CT imaging of the chest, abdomen and pelvis was performed following the standard protocol during bolus administration of intravenous contrast. CONTRAST:  75mL ISOVUE-300 IOPAMIDOL (ISOVUE-300) INJECTION 61% COMPARISON:  CT of the chest, abdomen and pelvis 06/02/2015. FINDINGS: CT CHEST FINDINGS Mediastinum/Lymph Nodes:  Heart size is normal. There is no significant pericardial fluid, thickening or pericardial calcification. There is atherosclerosis of the thoracic aorta, the great vessels of the mediastinum and the coronary arteries, including calcified atherosclerotic plaque in the left anterior descending coronary artery. Mildly enlarged low right paratracheal lymph node measuring 1 cm in short axis. Enlarged low left paratracheal lymph node measuring 1 cm in short axis. No hilar lymphadenopathy. Esophagus is unremarkable in appearance. No axillary lymphadenopathy. Lungs/Pleura: Evaluation of the lung parenchyma is limited by considerable patient respiratory motion on today's examination. Previously noted pulmonary nodules in the right middle lobe appear slightly smaller and less distinct than the prior study, largest of which measures 8 mm on today's examination (image 83 of series 8), suggesting some positive response to therapy. Previously noted nodule in the base of the left lower lobe currently measures 13 x 9 mm (image 74 of series 8) and is similar to the prior examination. Previously noted nodules in the right lung base are now obscured by considerable airspace consolidation dependently, and extensive peribronchovascular micronodularity which is present throughout all aspects of the lung bases bilaterally, but is most severe in the right lower lobe. Many of the right lower lobe bronchi in the basal segments appear completely opacified by retained secretions. Similar findings are present to a lesser degree in the left lower lobe. This patchy peribronchovascular micronodularity is present throughout all aspects of the lungs bilaterally extending into the lung apices, but does have a basal predominance where there is also some associated septal thickening and very mild septal nodularity. Trace right pleural effusion lying dependently. Musculoskeletal/Soft Tissues: There are no aggressive appearing lytic or blastic lesions  noted in the visualized portions of the skeleton. CT ABDOMEN AND PELVIS FINDINGS Hepatobiliary: In the inferior aspect of segment 6 of the liver there appears to be direct invasion of the hepatic parenchyma from the patient's large right renal metastasis. This  is best appreciated on coronal image 59 of series 604. No other suspicious cystic or solid hepatic lesions are noted. No intra or extrahepatic biliary ductal dilatation. Diffuse periportal edema. Gallbladder is moderately distended. However, there are no gallstones, and no pericholecystic fluid or surrounding inflammatory changes to suggest an acute cholecystitis at this time. Pancreas: No pancreatic mass. No pancreatic ductal dilatation. No pancreatic or peripancreatic fluid or inflammatory changes. Spleen: Unremarkable. Adrenals/Urinary Tract: Bilateral adrenal glands and the left kidney are normal in appearance. Again noted are multiple infiltrative masses within the right kidney related to known metastatic disease. On today's study this currently measures approximately 13.5 x 9.0 x 13.0 cm (slightly smaller than the prior examination), and is highly invasive into adjacent structures, including the under surface of segment 6 of the liver and posteriorly into the right psoas, quadratus lumborum and right flank musculature. Status post radical cystectomy with ileal conduit. Stomach/Bowel: The appearance of the stomach is normal. There multiple borderline dilated loops of small bowel demonstrating air-fluid levels. These measure up to 3.8 cm in diameter. Status post low anterior resection. Left lower quadrant colostomy. Large amount of liquid stool in the colon. Vascular/Lymphatic: Atherosclerosis throughout the abdominal and pelvic vasculature, without evidence of aneurysm or dissection. Multiple borderline enlarged retroperitoneal lymph nodes are again noted, but there are no definite pathologically enlarged lymph nodes identified in the abdomen or pelvis.  Reproductive: Status post radical prostatectomy. Other: Soft tissue thickening in the presacral space and along the right pelvic sidewall, similar to prior examinations, presumably post treatment related. Trace volume of ascites. No pneumoperitoneum. Mild diffuse mesenteric edema. Musculoskeletal: Again noted is some osteolysis and associated soft tissue thickening of the coccyx, which currently measures up to 4.4 x 4.1 cm. Whether or not this is from an osseous metastasis or is related to chronic osteomyelitis is uncertain, but this appears to have progressed slightly compared to the prior examination. IMPRESSION: 1. Today's study demonstrates some positive response to therapy with slight decreased size of the highly infiltrative right renal mass. This mass continues to show direct invasion of segment 6 of the liver, the right psoas muscle, right quadratus lumborum and other posterior right-sided flank musculature. 2. Previously noted pulmonary nodules appear generally stable to slightly decreased compared to the prior examination, however, assessment is limited on today's study given the widespread peribronchovascular nodularity seen on today's examination. Although the appearance of today's study favors infectious/inflammatory etiologies, potentially related to significant aspiration, the possibility of developing areas of lymphangitic spread of disease are not excluded, particularly in the lung bases. 3. Borderline dilated loops of small bowel with multiple air-fluid levels. In addition, there is a large volume of liquid stool throughout the colon. Findings do not suggest obstruction, but may indicate an enteritis. 4. Enlarging area of soft tissue thickening and osteolysis in the coccyx may reflect metastatic disease, or may indicate chronic osteomyelitis. 5. New trace right pleural effusion. 6. Additional findings, as above, similar to prior examinations. Electronically Signed   By: Vinnie Langton M.D.   On:  08/25/2015 11:17   X-ray Chest Pa And Lateral  08/26/2015  CLINICAL DATA:  Shortness of breath.  Rectal cancer EXAM: CHEST  2 VIEW COMPARISON:  06/02/2015 FINDINGS: There is a a right chest wall port a catheter with tip in the projection of the SVC. The heart size appears normal. No pleural effusion or edema. Bilateral nodular densities are again noted compatible with lung metastases. IMPRESSION: 1. No acute cardiopulmonary abnormalities. 2. Pulmonary metastasis. Electronically  Signed   By: Kerby Moors M.D.   On: 08/19/2015 19:58   Ct Chest W Contrast  08/25/2015  CLINICAL DATA:  60 year old male with history of rectal cancer diagnosed in 2015, with renal metastasis diagnosed in 2016, as well as lung metastases and bone metastases. Ongoing chemotherapy. Increased colostomy output. Diffuse pain. EXAM: CT CHEST, ABDOMEN, AND PELVIS WITH CONTRAST TECHNIQUE: Multidetector CT imaging of the chest, abdomen and pelvis was performed following the standard protocol during bolus administration of intravenous contrast. CONTRAST:  57mL ISOVUE-300 IOPAMIDOL (ISOVUE-300) INJECTION 61% COMPARISON:  CT of the chest, abdomen and pelvis 06/02/2015. FINDINGS: CT CHEST FINDINGS Mediastinum/Lymph Nodes: Heart size is normal. There is no significant pericardial fluid, thickening or pericardial calcification. There is atherosclerosis of the thoracic aorta, the great vessels of the mediastinum and the coronary arteries, including calcified atherosclerotic plaque in the left anterior descending coronary artery. Mildly enlarged low right paratracheal lymph node measuring 1 cm in short axis. Enlarged low left paratracheal lymph node measuring 1 cm in short axis. No hilar lymphadenopathy. Esophagus is unremarkable in appearance. No axillary lymphadenopathy. Lungs/Pleura: Evaluation of the lung parenchyma is limited by considerable patient respiratory motion on today's examination. Previously noted pulmonary nodules in the right middle  lobe appear slightly smaller and less distinct than the prior study, largest of which measures 8 mm on today's examination (image 83 of series 8), suggesting some positive response to therapy. Previously noted nodule in the base of the left lower lobe currently measures 13 x 9 mm (image 74 of series 8) and is similar to the prior examination. Previously noted nodules in the right lung base are now obscured by considerable airspace consolidation dependently, and extensive peribronchovascular micronodularity which is present throughout all aspects of the lung bases bilaterally, but is most severe in the right lower lobe. Many of the right lower lobe bronchi in the basal segments appear completely opacified by retained secretions. Similar findings are present to a lesser degree in the left lower lobe. This patchy peribronchovascular micronodularity is present throughout all aspects of the lungs bilaterally extending into the lung apices, but does have a basal predominance where there is also some associated septal thickening and very mild septal nodularity. Trace right pleural effusion lying dependently. Musculoskeletal/Soft Tissues: There are no aggressive appearing lytic or blastic lesions noted in the visualized portions of the skeleton. CT ABDOMEN AND PELVIS FINDINGS Hepatobiliary: In the inferior aspect of segment 6 of the liver there appears to be direct invasion of the hepatic parenchyma from the patient's large right renal metastasis. This is best appreciated on coronal image 59 of series 604. No other suspicious cystic or solid hepatic lesions are noted. No intra or extrahepatic biliary ductal dilatation. Diffuse periportal edema. Gallbladder is moderately distended. However, there are no gallstones, and no pericholecystic fluid or surrounding inflammatory changes to suggest an acute cholecystitis at this time. Pancreas: No pancreatic mass. No pancreatic ductal dilatation. No pancreatic or peripancreatic fluid  or inflammatory changes. Spleen: Unremarkable. Adrenals/Urinary Tract: Bilateral adrenal glands and the left kidney are normal in appearance. Again noted are multiple infiltrative masses within the right kidney related to known metastatic disease. On today's study this currently measures approximately 13.5 x 9.0 x 13.0 cm (slightly smaller than the prior examination), and is highly invasive into adjacent structures, including the under surface of segment 6 of the liver and posteriorly into the right psoas, quadratus lumborum and right flank musculature. Status post radical cystectomy with ileal conduit. Stomach/Bowel: The appearance of  the stomach is normal. There multiple borderline dilated loops of small bowel demonstrating air-fluid levels. These measure up to 3.8 cm in diameter. Status post low anterior resection. Left lower quadrant colostomy. Large amount of liquid stool in the colon. Vascular/Lymphatic: Atherosclerosis throughout the abdominal and pelvic vasculature, without evidence of aneurysm or dissection. Multiple borderline enlarged retroperitoneal lymph nodes are again noted, but there are no definite pathologically enlarged lymph nodes identified in the abdomen or pelvis. Reproductive: Status post radical prostatectomy. Other: Soft tissue thickening in the presacral space and along the right pelvic sidewall, similar to prior examinations, presumably post treatment related. Trace volume of ascites. No pneumoperitoneum. Mild diffuse mesenteric edema. Musculoskeletal: Again noted is some osteolysis and associated soft tissue thickening of the coccyx, which currently measures up to 4.4 x 4.1 cm. Whether or not this is from an osseous metastasis or is related to chronic osteomyelitis is uncertain, but this appears to have progressed slightly compared to the prior examination. IMPRESSION: 1. Today's study demonstrates some positive response to therapy with slight decreased size of the highly infiltrative  right renal mass. This mass continues to show direct invasion of segment 6 of the liver, the right psoas muscle, right quadratus lumborum and other posterior right-sided flank musculature. 2. Previously noted pulmonary nodules appear generally stable to slightly decreased compared to the prior examination, however, assessment is limited on today's study given the widespread peribronchovascular nodularity seen on today's examination. Although the appearance of today's study favors infectious/inflammatory etiologies, potentially related to significant aspiration, the possibility of developing areas of lymphangitic spread of disease are not excluded, particularly in the lung bases. 3. Borderline dilated loops of small bowel with multiple air-fluid levels. In addition, there is a large volume of liquid stool throughout the colon. Findings do not suggest obstruction, but may indicate an enteritis. 4. Enlarging area of soft tissue thickening and osteolysis in the coccyx may reflect metastatic disease, or may indicate chronic osteomyelitis. 5. New trace right pleural effusion. 6. Additional findings, as above, similar to prior examinations. Electronically Signed   By: Vinnie Langton M.D.   On: 08/25/2015 11:17   Dg Chest Port 1 View  08/28/2015  CLINICAL DATA:  Respiratory failure EXAM: PORTABLE CHEST 1 VIEW COMPARISON:  Portable chest x-ray of August 27, 2015 FINDINGS: The lungs are adequately inflated. The trachea and esophagus have been extubated. The interstitial markings are coarse at the lung bases greatest on the right. There is no discrete alveolar infiltrate. There is no pleural effusion or pneumothorax. The heart and pulmonary vascularity are normal. The power port appliance tip projects over the distal third of the SVC. IMPRESSION: Interval extubation of the trachea and esophagus. Stable stable mild bibasilar atelectasis. Electronically Signed   By: Wash Nienhaus  [place] M.D.   On: 08/28/2015 07:12   Portable  Chest Xray  08/27/2015  CLINICAL DATA:  Respiratory failure. EXAM: PORTABLE CHEST 1 VIEW COMPARISON:  CT 08/25/2015.  Chest x-ray 08/16/2015. FINDINGS: Endotracheal tube, NG tube, right IJ line stable position. Heart size normal. No focal infiltrate. No pleural effusion or pneumothorax. IMPRESSION: 1.  Lines and tubes in stable position. 2. Persistent mild bibasilar atelectasis and infiltrates. Reference is made to prior CT report 08/25/2015. Electronically Signed   By: Marcello Moores  Register   On: 08/27/2015 07:26   Dg Chest Port 1 View  08/25/2015  CLINICAL DATA:  Endotracheal tube placement.  Initial encounter. EXAM: PORTABLE CHEST 1 VIEW COMPARISON:  Chest radiograph performed 08/29/2015 FINDINGS: The patient's endotracheal tube is seen  ending 5 cm above the carina. An enteric tube is noted coiling within the stomach. A right-sided chest port is noted ending about the cavoatrial junction. Patchy right basilar airspace opacity may reflect pneumonia or asymmetric interstitial edema. No pleural effusion or pneumothorax is seen. The cardiomediastinal silhouette is normal in size. No acute osseous abnormalities are identified. IMPRESSION: 1. Endotracheal tube seen ending 5 cm above the carina. 2. Patchy right basilar airspace opacity may reflect pneumonia or asymmetric interstitial edema. Electronically Signed   By: Garald Balding M.D.   On: 08/25/2015 23:56    Microbiology: Recent Results (from the past 240 hour(s))  Culture, blood (routine x 2) Call MD if unable to obtain prior to antibiotics being given     Status: None   Collection Time: 08/25/15  5:42 PM  Result Value Ref Range Status   Specimen Description BLOOD LEFT WRIST  Final   Special Requests BOTTLES DRAWN AEROBIC AND ANAEROBIC 5CC  Final   Culture   Final    NO GROWTH 5 DAYS Performed at [facility]    Report Status 08/30/2015 FINAL  Final  Culture, blood (routine x 2) Call MD if unable to obtain prior to antibiotics being given      Status: None   Collection Time: 08/25/15  5:47 PM  Result Value Ref Range Status   Specimen Description BLOOD RIGHT ARM  Final   Special Requests BOTTLES DRAWN AEROBIC AND ANAEROBIC 6.5CC  Final   Culture   Final    NO GROWTH 5 DAYS Performed at [facility]    Report Status 08/30/2015 FINAL  Final  MRSA PCR Screening     Status: None   Collection Time: 08/26/15 12:30 AM  Result Value Ref Range Status   MRSA by PCR NEGATIVE NEGATIVE Final    Comment:        The GeneXpert MRSA Assay (FDA approved for NASAL specimens only), is one component of a comprehensive MRSA colonization surveillance program. It is not intended to diagnose MRSA infection nor to guide or monitor treatment for MRSA infections.   Culture, respiratory (NON-Expectorated)     Status: None   Collection Time: 08/26/15  8:05 AM  Result Value Ref Range Status   Specimen Description TRACHEAL ASPIRATE  Final   Special Requests Immunocompromised  Final   Gram Stain   Final    ABUNDANT WBC PRESENT,BOTH PMN AND MONONUCLEAR FEW SQUAMOUS EPITHELIAL CELLS PRESENT MODERATE YEAST RARE GRAM POSITIVE RODS Performed at [facility]    Culture   Final    MODERATE YEAST Performed at [facility]    Report Status 08/29/2015 FINAL  Final     Labs: Basic Metabolic Panel:  Recent Labs Lab 08/26/15 0311 08/27/15 0440 08/27/15 2055 08/28/15 0640 08/29/15 0500  NA 145 146*  --  144 147*  K 3.7 2.7* 2.7* 4.1 4.1  CL 117* 107  --  108 108  CO2 17* 30  --  29 30  GLUCOSE 171* 128*  --  154* 151*  BUN 15 15  --  13 13  CREATININE 1.14 1.00  --  0.91 0.88  CALCIUM 8.6* 7.6*  --  7.3* 8.0*  MG  --  1.3* 1.9 1.7  --   PHOS  --  1.3* 1.9* 3.1  --    Liver Function Tests: No results for input(s): AST, ALT, ALKPHOS, BILITOT, PROT, ALBUMIN in the last 168 hours. No results for input(s): LIPASE, AMYLASE in the last 168 hours.  No results for input(s): AMMONIA in the last 168 hours. CBC:  Recent  Labs Lab 08/26/15 0311 08/27/15 0440 08/28/15 0640 08/29/15 0500  WBC 16.1* 10.5 14.5* 23.2*  HGB 8.5* 8.0* 8.2* 8.4*  HCT 27.8* 25.7* 26.2* 27.6*  MCV 84.8 83.7 84.5 85.4  PLT 332 288 283 351   Cardiac Enzymes: No results for input(s): CKTOTAL, CKMB, CKMBINDEX, TROPONINI in the last 168 hours. BNP: Invalid input(s): POCBNP CBG:  Recent Labs Lab 08/27/15 1117 08/27/15 1554 08/27/15 1955 08/27/15 2351 08/28/15 0749  GLUCAP 166* 139* 150* 162* 126*    Time of Death: 0855  Signed:  Orson Eva, DO [facility] [id number] 2015/09/05, 9:47 AM

## 2015-09-14 NOTE — Progress Notes (Signed)
Daily Progress Note   Patient Name: Grant [name]       Date: 09-02-15 DOB: Mar 18, 1956  Age: 60 y.o. MRN#: [id number] Attending Physician: Orson Eva, MD Primary Care Physician: No primary care provider on file. Admit Date: 08/15/2015  Reason for Consultation/Follow-up: terminal care.   Subjective: Actively dying, no family at bedside this am.  Unresponsive, appears comfortable.   Length of Stay: 12 days  Current Medications: Scheduled Meds:  . antiseptic oral rinse  7 mL Mouth Rinse q12n4p  . chlorhexidine  15 mL Mouth Rinse BID  . scopolamine  1 patch Transdermal Q72H  . sodium chloride flush  3 mL Intravenous Q12H    Continuous Infusions: . morphine 5 mg/hr (08/30/15 1939)    PRN Meds: sodium chloride, acetaminophen **OR** acetaminophen, glycopyrrolate, haloperidol lactate, ipratropium, levalbuterol, lidocaine-prilocaine, LORazepam, morphine, ondansetron **OR** ondansetron (ZOFRAN) IV, prochlorperazine, sodium chloride flush, sodium chloride flush  Physical Exam: Physical Exam  General: Does not respond to verbal or tactile stimulation, in NAD. Cardiovascular: ?bradycardic this am.  Resp: Increased periods of apnea noted. Abdomen: BS x 4, soft, NT/ND. Musculoskeletal: No gross deformities, no edema.  Skin: Intact, some coolness in digits both UE and LE, no mottling   Vital Signs: BP 104/74 mmHg  Pulse 111  Temp(Src) 101.3 F (38.5 C) (Axillary)  Resp 9  Ht 6\' 6"  (1.981 m)  Wt 67.5 kg (148 lb 13 oz)  BMI 17.20 kg/m2  SpO2 97% SpO2: SpO2: 97 % O2 Device: O2 Device: Nasal Cannula O2 Flow Rate: O2 Flow Rate (L/min): 2 L/min  Intake/output summary:   Intake/Output Summary (Last 24 hours) at Sep 02, 2015 0810 Last data filed at 09-02-2015 0505  Gross per 24 hour   Intake      0 ml  Output    250 ml  Net   -250 ml   LBM: Last BM Date: 08/30/15 Baseline Weight: Weight: 59.5 kg (131 lb 2.8 oz) Most recent weight: Weight: 67.5 kg (148 lb 13 oz)       Palliative Assessment/Data: Flowsheet Rows        Most Recent Value   Intake Tab    Referral Department  Pulmonary   Unit at Time of Referral  ICU   Palliative Care Primary Diagnosis  Cancer   Date Notified  08/25/15   Palliative Care  Type  New Palliative care   Date of Admission  09/13/2015   Date first seen by Palliative Care  08/26/15   # of days Palliative referral response time  1 Day(s)   # of days IP prior to Palliative referral  5   Clinical Assessment    Palliative Performance Scale Score  10%   Pain Max last 24 hours  Not able to report   Pain Min Last 24 hours  Not able to report   Psychosocial & Spiritual Assessment    Palliative Care Outcomes    Patient/Family meeting held?  Yes   Who was at the meeting?  Long term girlfriend/POA   Palliative Care Outcomes  Clarified goals of care      Additional Data Reviewed: CBC    Component Value Date/Time   WBC 23.2* 08/29/2015 0500   WBC 0.5* 08/31/2015 1438   RBC 3.23* 08/29/2015 0500   RBC 3.64* 09/05/2015 1438   RBC 2.58* 05/14/2014 0730   HGB 8.4* 08/29/2015 0500   HGB 9.6* 09/06/2015 1438   HCT 27.6* 08/29/2015 0500   HCT 29.1* 08/25/2015 1438   PLT 351 08/29/2015 0500   PLT 277 08/29/2015 1438   MCV 85.4 08/29/2015 0500   MCV 79.9 09/12/2015 1438   MCH 26.0 08/29/2015 0500   MCH 26.4* 09/02/2015 1438   MCHC 30.4 08/29/2015 0500   MCHC 33.0 08/16/2015 1438   RDW 19.4* 08/29/2015 0500   RDW 17.1* 08/26/2015 1438   LYMPHSABS 0.4* 08/22/2015 0502   LYMPHSABS 0.1* 08/28/2015 1438   MONOABS 0.1 08/22/2015 0502   MONOABS 0.1 09/03/2015 1438   EOSABS 0.0 08/22/2015 0502   EOSABS 0.0 08/22/2015 1438   BASOSABS 0.0 08/22/2015 0502   BASOSABS 0.0 09/07/2015 1438    CMP     Component Value Date/Time   NA 147*  08/29/2015 0500   NA 134* 08/28/2015 1438   K 4.1 08/29/2015 0500   K 4.1 08/19/2015 1438   CL 108 08/29/2015 0500   CO2 30 08/29/2015 0500   CO2 25 08/31/2015 1438   GLUCOSE 151* 08/29/2015 0500   GLUCOSE 127 08/21/2015 1438   BUN 13 08/29/2015 0500   BUN 17.8 08/31/2015 1438   CREATININE 0.88 08/29/2015 0500   CREATININE 1.3 08/23/2015 1438   CALCIUM 8.0* 08/29/2015 0500   CALCIUM 9.5 09/04/2015 1438   PROT 6.4 08/23/2015 1438   PROT 6.2 05/13/2014 1700   ALBUMIN 2.9* 08/31/2015 1438   ALBUMIN 2.7* 05/13/2014 1700   AST 9 08/17/2015 1438   AST 18 05/13/2014 1700   ALT <9 08/17/2015 1438   ALT 12 05/13/2014 1700   ALKPHOS 171* 09/04/2015 1438   ALKPHOS 74 05/13/2014 1700   BILITOT 0.88 09/08/2015 1438   BILITOT 0.6 05/13/2014 1700   GFRNONAA >60 08/29/2015 0500   GFRAA >60 08/29/2015 0500       Problem List:  Patient Active Problem List   Diagnosis Date Noted  . Palliative care encounter   . Terminal care   . Acute respiratory failure ([place])   . Encounter for intubation   . Aspiration pneumonia ([place]): Probable 08/25/2015  . SOB (shortness of breath)   . Candiduria 08/22/2015  . UTI (urinary tract infection) 08/21/2015  . Ulcer of sacral region 09/06/2015  . Neutropenia ([place]) 08/19/2015  . FTT (failure to thrive) in adult   . 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 Abdominal abscess ([place])   . Renal abscess, right   . Renal cyst  05/13/2014  . Neoplasm related pain 05/13/2014  . Nausea without vomiting 05/13/2014  . Other malaise and fatigue 01/28/2014  . Pain in male perineum 01/28/2014  . Chewing tobacco nicotine dependence without complication [id number]  . CA of rectum ([place]) 12/15/2013  . Dehydration 12/10/2013  . Diarrhea 12/10/2013  . Hyponatremia 12/10/2013  . Rectal cancer ([place]) 11/08/2013  . 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 Ureteral obstruction, right 10/10/2013  . Anemia of chronic disease 10/10/2013  . Severe protein-calorie  malnutrition ([place]) 10/10/2013  . Microcytic anemia 10/10/2013  . Rectal mass 10/10/2013     Palliative Care Assessment & Plan    1.Code Status:  DNR    Code Status Orders        Start     Ordered   08/27/15 2140  Do not attempt resuscitation (DNR)   Continuous    Question Answer Comment  In the event of cardiac or respiratory ARREST Do not call a "code blue"   In the event of cardiac or respiratory ARREST Do not perform Intubation, CPR, defibrillation or ACLS   In the event of cardiac or respiratory ARREST Use medication by any route, position, wound care, and other measures to relive pain and suffering. May use oxygen, suction and manual treatment of airway obstruction as needed for comfort.   Comments No Vasopressors for BP support      08/27/15 2139    Code Status History    Date Active Date Inactive Code Status Order ID Comments User Context   08/22/2015  5:42 PM 08/27/2015  9:39 PM Full Code [id number]  Eugenie Filler, MD Inpatient   05/15/2014 12:33 PM 05/22/2014  3:44 PM Full Code [id number]  Jacqulynn Cadet, MD Inpatient   05/13/2014  4:50 PM 05/15/2014 12:33 PM Full Code [id number]  [name], MD Inpatient   03/12/2014  3:50 PM 03/13/2014  3:39 AM Full Code [id number]  Azzie Roup, MD [place]   12/10/2013 10:44 PM 12/13/2013  4:36 AM Full Code [id number]  Berle Mull, MD ED   10/18/2013  5:32 PM 10/20/2013  6:25 PM Full Code [id number]  Markus Daft, MD Inpatient   10/13/2013  7:39 AM 10/18/2013  5:32 PM Full Code [id number]  Sandi Mariscal, MD Inpatient   10/09/2013  8:58 PM 10/13/2013  7:39 AM Full Code [id number]  [name], MD Inpatient   10/09/2013  7:15 PM 10/09/2013  8:58 PM Full Code [id number]  Shanda Howells, MD ED    Advance Directive Documentation        Most Recent Value   Type of Advance Directive  Healthcare Power of Attorney, Living will   Pre-existing out of facility DNR order (yellow form or pink MOST form)     "MOST" Form in Place?         2. Goals of  Care/Additional Recommendations:  -  Anticipate hospital death  - Plan for comfort care moving forward. He is actively dying. Likely prognosis is ?less than 24 hours at this point.   3. Symptom Management:      1. Pain/dyspnea: Continue morphine.  He is currently on continuous infusion and resting comfortably.      2. Anxiety: Ativan as needed      3. Agitation: Haldol as needed      4. Excess secretions: Robinul as needed, also has scopolamine patch.   4. Palliative Prophylaxis:   Aspiration, Delirium Protocol and Frequent Pain Assessment  5. Prognosis: Hours - Days.    6. Discharge Planning:  Anticipated Hospital Death. He is actively dying.   Care plan was discussed with  Dr Martyn Malay.   Thank you for allowing the Palliative Medicine Team to assist in the care of this patient.   Time In: 0730 Time Out: 0805 Total Time 35 Prolonged Time Billed no       [id number] Loistine Chance, MD  09/15/2015, 8:10 AM  Please contact Palliative Medicine Team phone at [phone] for questions and concerns.

## 2015-09-14 DEATH — deceased

## 2015-10-30 ENCOUNTER — Other Ambulatory Visit: Payer: Self-pay | Admitting: Nurse Practitioner

## 2016-10-25 IMAGING — DX DG CHEST 2V
3 series · 3 of 3 positions shown · non-contrast
Comparison: 06/02/2015

CLINICAL DATA: Shortness of breath.  Rectal cancer

EXAM:
CHEST  2 VIEW

[chest lat]
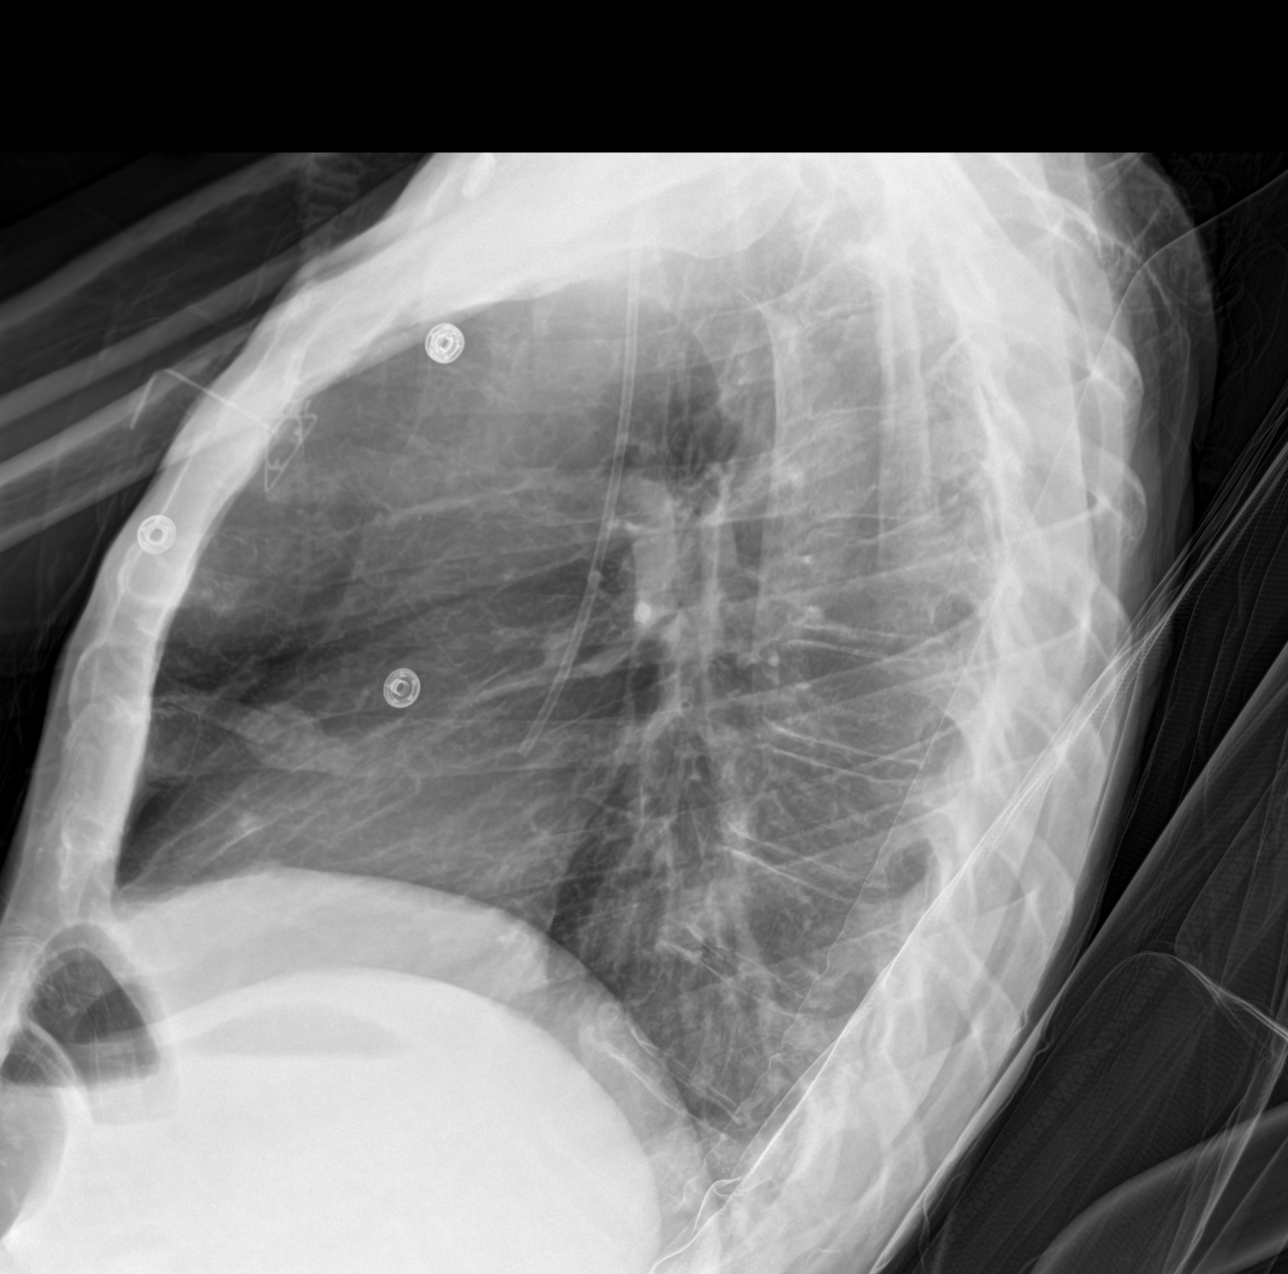

[chest ap (1 of 2)]
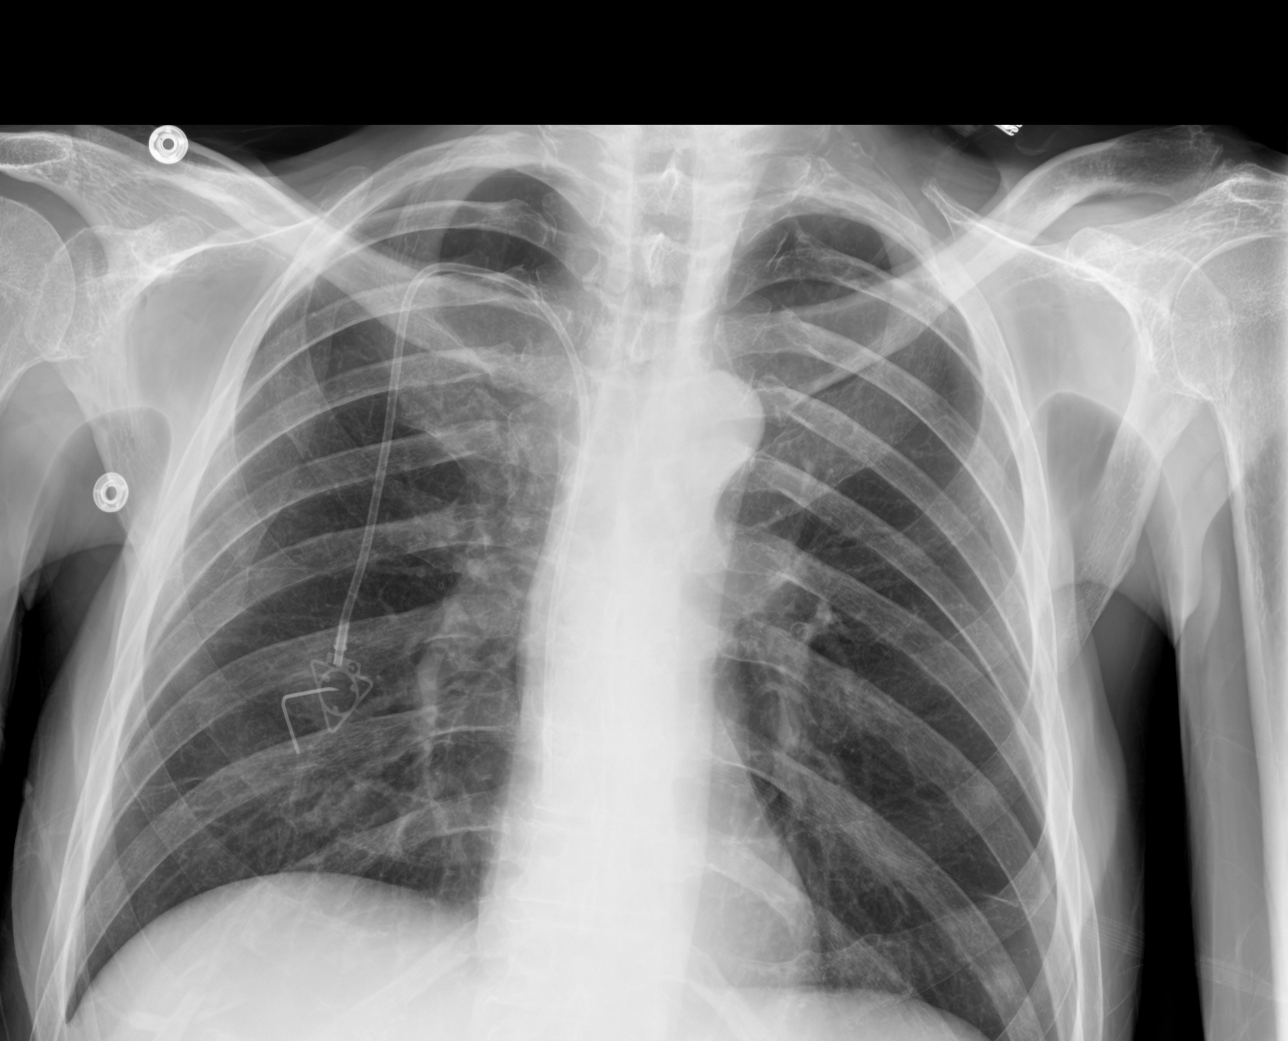

[chest ap (2 of 2)]
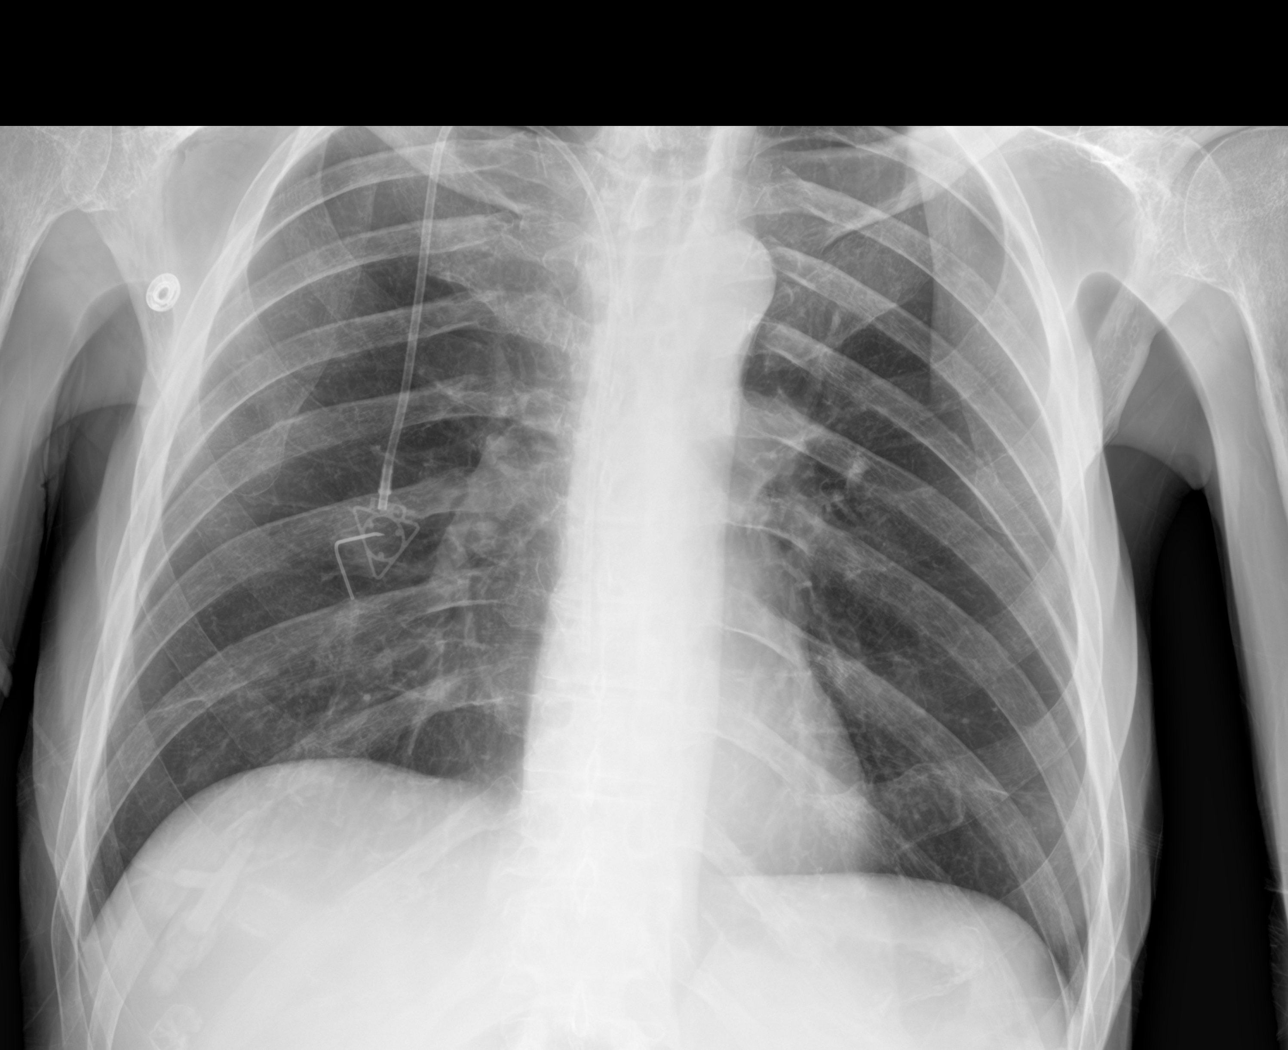

[3 of 3 positions shown; findings below may reference images not displayed]

FINDINGS: There is a a right chest wall port a catheter with tip in the
projection of the SVC. The heart size appears normal. No pleural
effusion or edema. Bilateral nodular densities are again noted
compatible with lung metastases.
IMPRESSION: 1. No acute cardiopulmonary abnormalities.
2. Pulmonary metastasis.

## 2016-11-01 IMAGING — DX DG CHEST 1V PORT
1 series · 2 of 2 positions shown · non-contrast
Comparison: CT 08/25/2015.  Chest x-ray 08/20/2015.

CLINICAL DATA: Respiratory failure.

EXAM:
PORTABLE CHEST 1 VIEW

[Series 1: chest ap · 0.14mm/px · 2 of 2 slices shown]
[im 1/2]
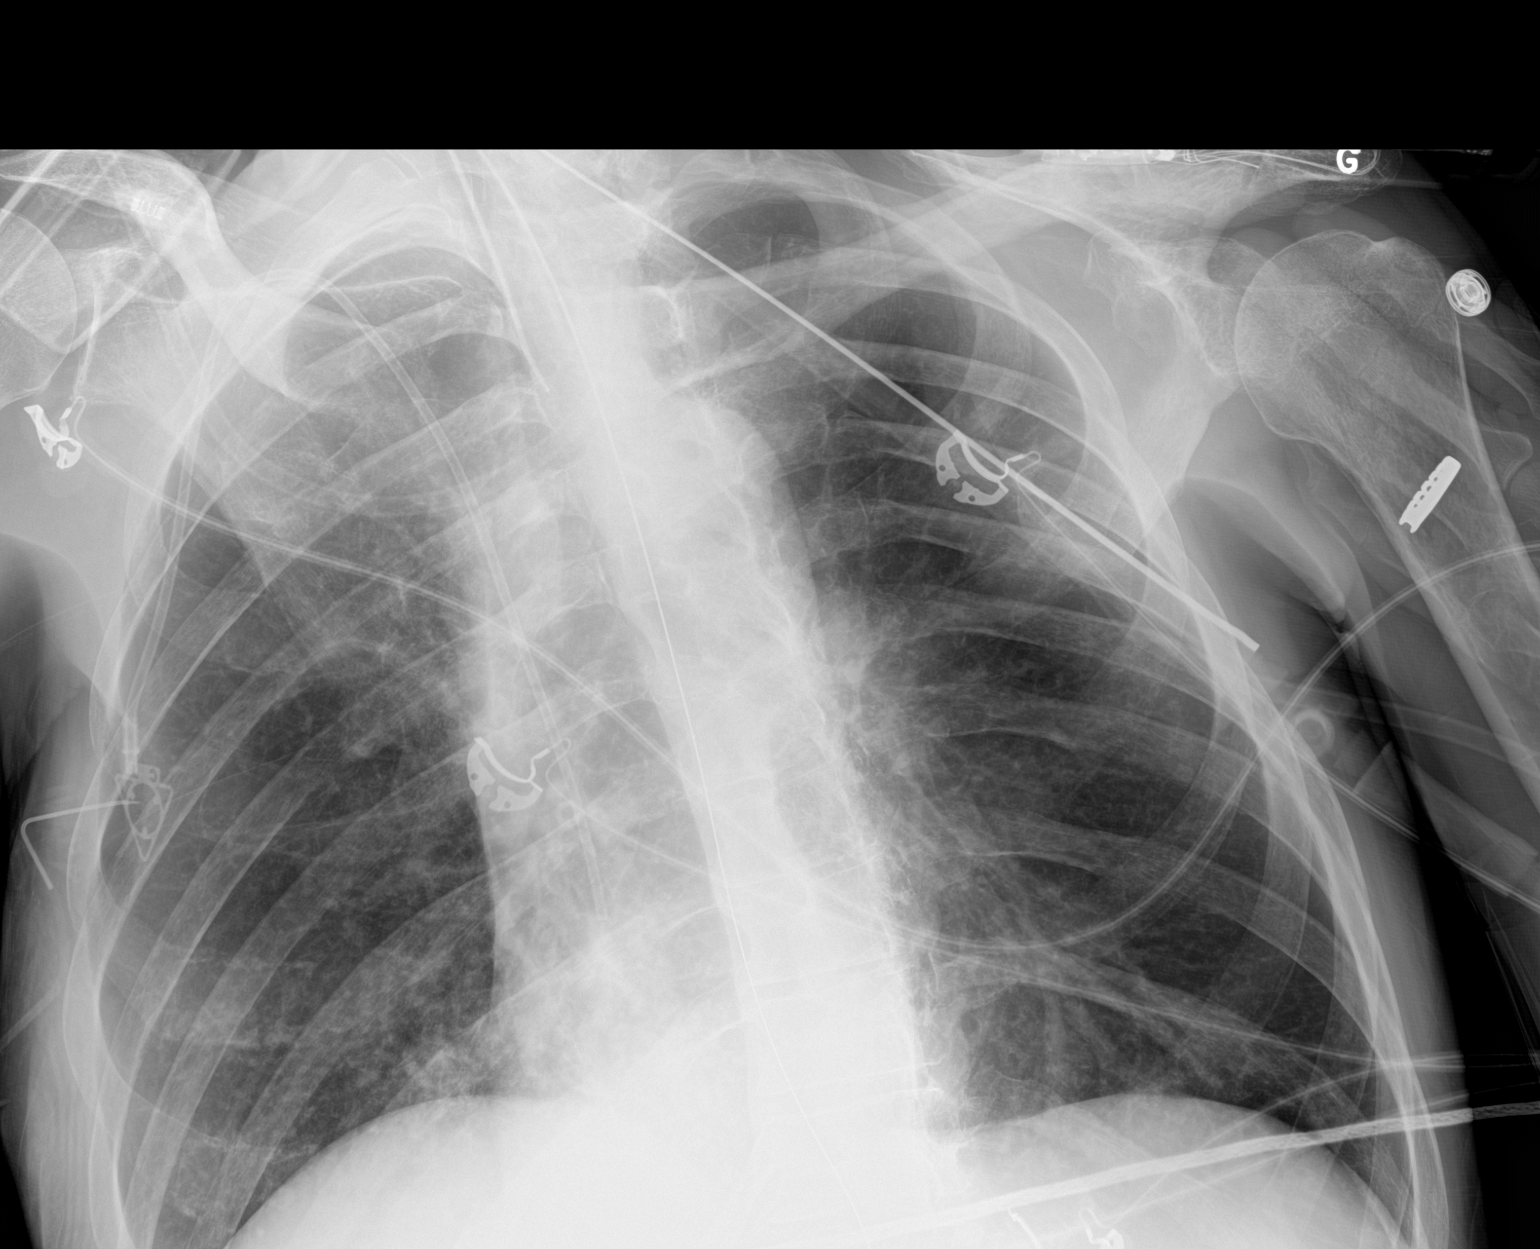
[im 2/2]
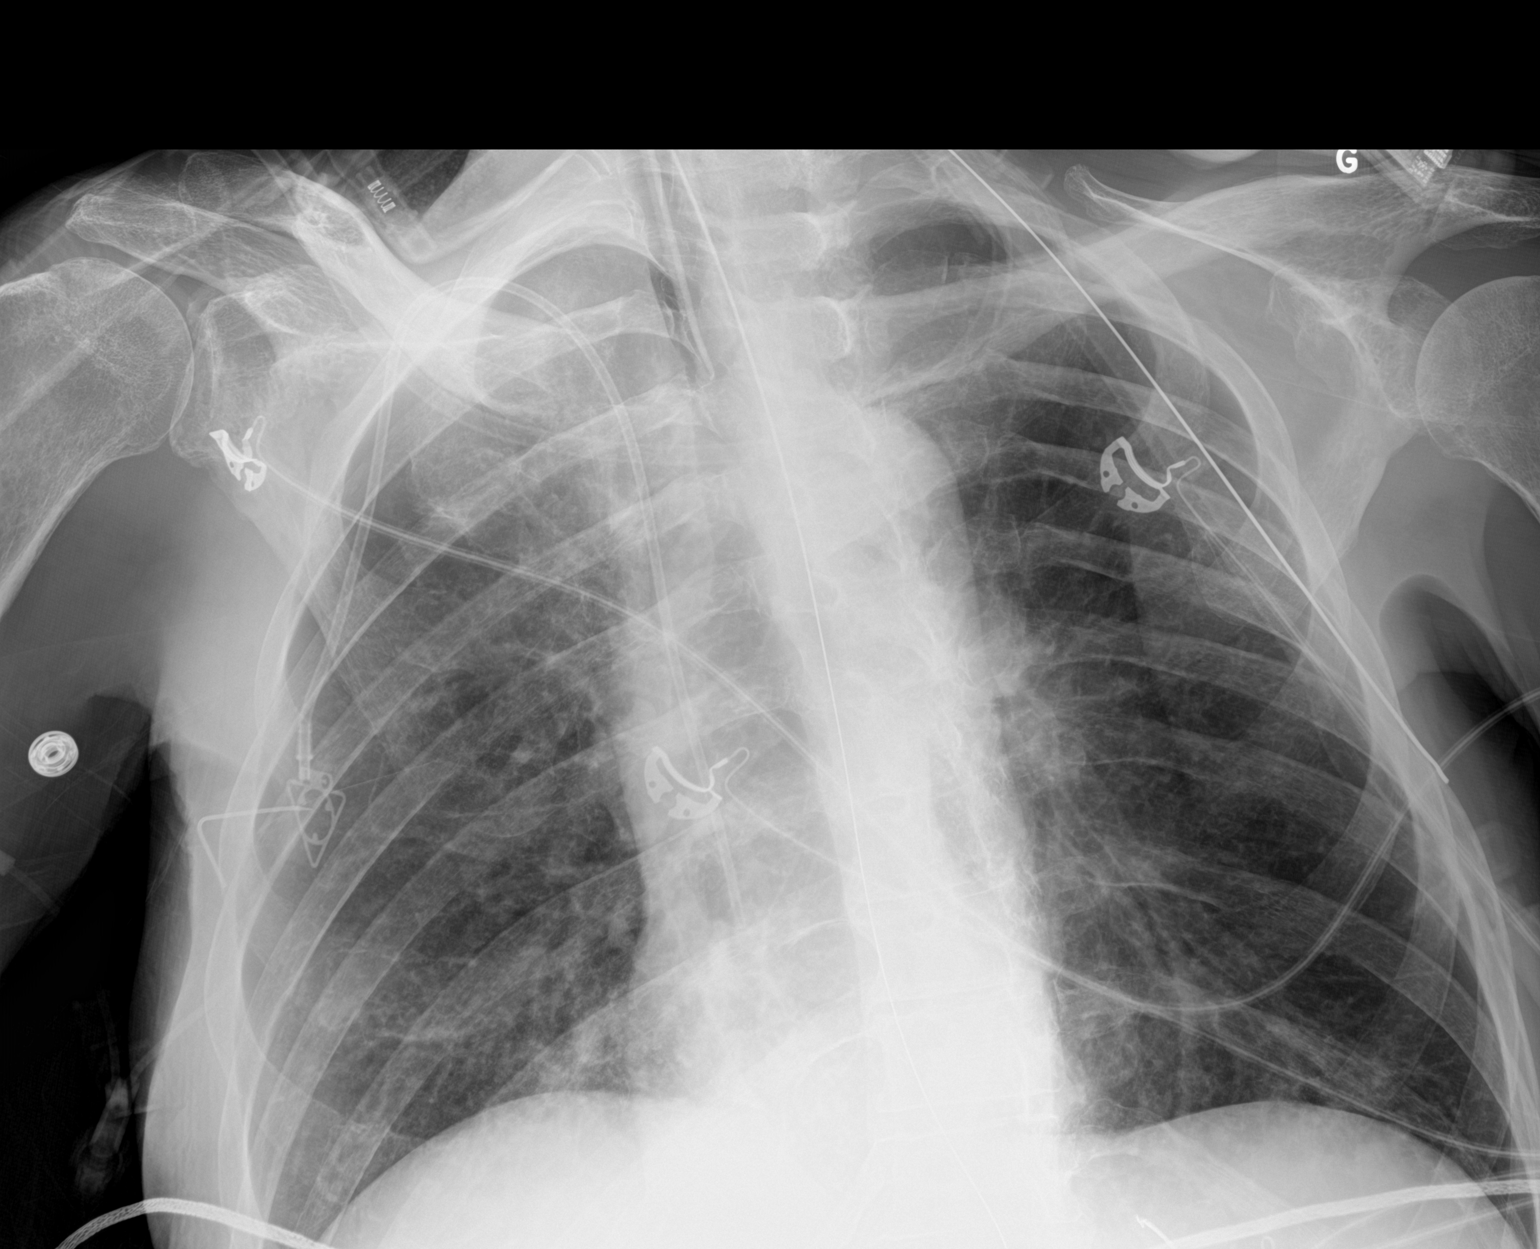

[2 of 2 positions shown; findings below may reference images not displayed]

FINDINGS: Endotracheal tube, NG tube, right IJ line stable position. Heart
size normal. No focal infiltrate. No pleural effusion or
pneumothorax.
IMPRESSION: 1.  Lines and tubes in stable position.

2. Persistent mild bibasilar atelectasis and infiltrates. Reference
is made to prior CT report 08/25/2015.
